# Patient Record
Sex: Female | Born: 1975 | Race: White | Hispanic: No | Marital: Single | Smoking: Never smoker
Health system: Southern US, Community
[De-identification: ages and names within clinical notes are randomized; demographics above are authoritative.]

## PROBLEM LIST (undated history)

## (undated) ENCOUNTER — Ambulatory Visit: Discharge status: Absent without leave | Payer: PRIVATE HEALTH INSURANCE

## (undated) DIAGNOSIS — C50412 Malignant neoplasm of upper-outer quadrant of left female breast: Principal | ICD-10-CM

## (undated) DIAGNOSIS — Z923 Personal history of irradiation: Secondary | ICD-10-CM

## (undated) DIAGNOSIS — Z87898 Personal history of other specified conditions: Secondary | ICD-10-CM

## (undated) DIAGNOSIS — F4541 Pain disorder exclusively related to psychological factors: Secondary | ICD-10-CM

## (undated) DIAGNOSIS — R569 Unspecified convulsions: Secondary | ICD-10-CM

## (undated) DIAGNOSIS — R011 Cardiac murmur, unspecified: Secondary | ICD-10-CM

## (undated) DIAGNOSIS — J45909 Unspecified asthma, uncomplicated: Secondary | ICD-10-CM

## (undated) HISTORY — PX: TYMPANOSTOMY TUBE PLACEMENT: SHX32

## (undated) HISTORY — DX: Malignant neoplasm of upper-outer quadrant of left female breast: C50.412

## (undated) HISTORY — PX: BREAST SURGERY: SHX581

## (undated) HISTORY — PX: ABDOMINAL HYSTERECTOMY: SHX81

---

## 1997-08-07 ENCOUNTER — Inpatient Hospital Stay (HOSPITAL_COMMUNITY): Admission: AD | Admit: 1997-08-07 | Discharge: 1997-08-08 | Payer: Self-pay | Admitting: *Deleted

## 1997-08-19 ENCOUNTER — Inpatient Hospital Stay (HOSPITAL_COMMUNITY): Admission: AD | Admit: 1997-08-19 | Discharge: 1997-08-22 | Payer: Self-pay | Admitting: Obstetrics

## 1997-08-26 ENCOUNTER — Inpatient Hospital Stay (HOSPITAL_COMMUNITY): Admission: AD | Admit: 1997-08-26 | Discharge: 1997-08-26 | Payer: Self-pay | Admitting: Obstetrics

## 1997-10-02 ENCOUNTER — Other Ambulatory Visit: Admission: RE | Admit: 1997-10-02 | Discharge: 1997-10-02 | Payer: Self-pay | Admitting: Family Medicine

## 1999-06-07 ENCOUNTER — Inpatient Hospital Stay (HOSPITAL_COMMUNITY): Admission: AD | Admit: 1999-06-07 | Discharge: 1999-06-07 | Payer: Self-pay | Admitting: *Deleted

## 1999-06-07 ENCOUNTER — Encounter: Payer: Self-pay | Admitting: *Deleted

## 1999-06-09 ENCOUNTER — Inpatient Hospital Stay (HOSPITAL_COMMUNITY): Admission: AD | Admit: 1999-06-09 | Discharge: 1999-06-09 | Payer: Self-pay | Admitting: *Deleted

## 1999-06-10 ENCOUNTER — Encounter (INDEPENDENT_AMBULATORY_CARE_PROVIDER_SITE_OTHER): Payer: Self-pay

## 1999-06-10 ENCOUNTER — Ambulatory Visit (HOSPITAL_COMMUNITY): Admission: RE | Admit: 1999-06-10 | Discharge: 1999-06-10 | Payer: Self-pay | Admitting: Obstetrics & Gynecology

## 2000-03-17 ENCOUNTER — Emergency Department (HOSPITAL_COMMUNITY): Admission: EM | Admit: 2000-03-17 | Discharge: 2000-03-17 | Payer: Self-pay | Admitting: Emergency Medicine

## 2000-03-17 ENCOUNTER — Encounter: Payer: Self-pay | Admitting: Emergency Medicine

## 2000-03-29 ENCOUNTER — Emergency Department (HOSPITAL_COMMUNITY): Admission: EM | Admit: 2000-03-29 | Discharge: 2000-03-30 | Payer: Self-pay | Admitting: Emergency Medicine

## 2001-12-12 ENCOUNTER — Emergency Department (HOSPITAL_COMMUNITY): Admission: EM | Admit: 2001-12-12 | Discharge: 2001-12-12 | Payer: Self-pay | Admitting: Emergency Medicine

## 2002-02-01 ENCOUNTER — Emergency Department (HOSPITAL_COMMUNITY): Admission: EM | Admit: 2002-02-01 | Discharge: 2002-02-01 | Payer: Self-pay | Admitting: Emergency Medicine

## 2002-06-17 ENCOUNTER — Emergency Department (HOSPITAL_COMMUNITY): Admission: EM | Admit: 2002-06-17 | Discharge: 2002-06-17 | Payer: Self-pay | Admitting: Emergency Medicine

## 2002-08-21 ENCOUNTER — Encounter: Admission: RE | Admit: 2002-08-21 | Discharge: 2002-08-21 | Payer: Self-pay | Admitting: *Deleted

## 2002-08-25 ENCOUNTER — Ambulatory Visit (HOSPITAL_COMMUNITY): Admission: RE | Admit: 2002-08-25 | Discharge: 2002-08-25 | Payer: Self-pay | Admitting: *Deleted

## 2002-09-04 ENCOUNTER — Encounter: Admission: RE | Admit: 2002-09-04 | Discharge: 2002-09-04 | Payer: Self-pay | Admitting: *Deleted

## 2002-09-11 ENCOUNTER — Inpatient Hospital Stay (HOSPITAL_COMMUNITY): Admission: AD | Admit: 2002-09-11 | Discharge: 2002-09-11 | Payer: Self-pay | Admitting: Family Medicine

## 2002-09-16 ENCOUNTER — Encounter: Admission: RE | Admit: 2002-09-16 | Discharge: 2002-09-16 | Payer: Self-pay | Admitting: *Deleted

## 2002-09-18 ENCOUNTER — Encounter: Admission: RE | Admit: 2002-09-18 | Discharge: 2002-09-18 | Payer: Self-pay | Admitting: *Deleted

## 2002-10-02 ENCOUNTER — Encounter: Admission: RE | Admit: 2002-10-02 | Discharge: 2002-10-02 | Payer: Self-pay | Admitting: *Deleted

## 2002-10-16 ENCOUNTER — Encounter: Admission: RE | Admit: 2002-10-16 | Discharge: 2002-10-16 | Payer: Self-pay | Admitting: *Deleted

## 2002-10-23 ENCOUNTER — Encounter: Admission: RE | Admit: 2002-10-23 | Discharge: 2002-10-23 | Payer: Self-pay | Admitting: Family Medicine

## 2002-10-23 ENCOUNTER — Ambulatory Visit (HOSPITAL_COMMUNITY): Admission: RE | Admit: 2002-10-23 | Discharge: 2002-10-23 | Payer: Self-pay | Admitting: *Deleted

## 2002-11-12 ENCOUNTER — Encounter: Admission: RE | Admit: 2002-11-12 | Discharge: 2002-11-12 | Payer: Self-pay | Admitting: *Deleted

## 2002-11-27 ENCOUNTER — Encounter: Admission: RE | Admit: 2002-11-27 | Discharge: 2002-11-27 | Payer: Self-pay | Admitting: *Deleted

## 2002-12-11 ENCOUNTER — Encounter: Admission: RE | Admit: 2002-12-11 | Discharge: 2002-12-11 | Payer: Self-pay | Admitting: *Deleted

## 2002-12-25 ENCOUNTER — Encounter: Admission: RE | Admit: 2002-12-25 | Discharge: 2002-12-25 | Payer: Self-pay | Admitting: *Deleted

## 2003-01-08 ENCOUNTER — Encounter: Admission: RE | Admit: 2003-01-08 | Discharge: 2003-01-08 | Payer: Self-pay | Admitting: Family Medicine

## 2003-01-11 ENCOUNTER — Inpatient Hospital Stay (HOSPITAL_COMMUNITY): Admission: AD | Admit: 2003-01-11 | Discharge: 2003-01-12 | Payer: Self-pay | Admitting: Family Medicine

## 2003-01-22 ENCOUNTER — Encounter: Admission: RE | Admit: 2003-01-22 | Discharge: 2003-01-22 | Payer: Self-pay | Admitting: Family Medicine

## 2003-02-05 ENCOUNTER — Encounter: Admission: RE | Admit: 2003-02-05 | Discharge: 2003-02-05 | Payer: Self-pay | Admitting: *Deleted

## 2003-02-10 ENCOUNTER — Inpatient Hospital Stay (HOSPITAL_COMMUNITY): Admission: AD | Admit: 2003-02-10 | Discharge: 2003-02-10 | Payer: Self-pay | Admitting: *Deleted

## 2003-02-19 ENCOUNTER — Ambulatory Visit (HOSPITAL_COMMUNITY): Admission: RE | Admit: 2003-02-19 | Discharge: 2003-02-19 | Payer: Self-pay | Admitting: Obstetrics and Gynecology

## 2003-02-19 ENCOUNTER — Encounter: Admission: RE | Admit: 2003-02-19 | Discharge: 2003-02-19 | Payer: Self-pay | Admitting: *Deleted

## 2003-03-02 ENCOUNTER — Inpatient Hospital Stay (HOSPITAL_COMMUNITY): Admission: AD | Admit: 2003-03-02 | Discharge: 2003-03-02 | Payer: Self-pay | Admitting: *Deleted

## 2003-03-03 ENCOUNTER — Encounter (INDEPENDENT_AMBULATORY_CARE_PROVIDER_SITE_OTHER): Payer: Self-pay | Admitting: *Deleted

## 2003-03-03 ENCOUNTER — Inpatient Hospital Stay (HOSPITAL_COMMUNITY): Admission: AD | Admit: 2003-03-03 | Discharge: 2003-03-06 | Payer: Self-pay | Admitting: *Deleted

## 2003-03-07 ENCOUNTER — Inpatient Hospital Stay (HOSPITAL_COMMUNITY): Admission: AD | Admit: 2003-03-07 | Discharge: 2003-03-07 | Payer: Self-pay | Admitting: Obstetrics and Gynecology

## 2004-02-12 ENCOUNTER — Emergency Department (HOSPITAL_COMMUNITY): Admission: EM | Admit: 2004-02-12 | Discharge: 2004-02-13 | Payer: Self-pay | Admitting: Emergency Medicine

## 2004-04-15 ENCOUNTER — Ambulatory Visit: Payer: Self-pay | Admitting: Internal Medicine

## 2004-05-01 ENCOUNTER — Emergency Department (HOSPITAL_COMMUNITY): Admission: EM | Admit: 2004-05-01 | Discharge: 2004-05-01 | Payer: Self-pay | Admitting: Family Medicine

## 2004-06-16 ENCOUNTER — Emergency Department (HOSPITAL_COMMUNITY): Admission: EM | Admit: 2004-06-16 | Discharge: 2004-06-16 | Payer: Self-pay | Admitting: Family Medicine

## 2004-06-20 ENCOUNTER — Emergency Department (HOSPITAL_COMMUNITY): Admission: EM | Admit: 2004-06-20 | Discharge: 2004-06-20 | Payer: Self-pay | Admitting: Family Medicine

## 2004-06-27 ENCOUNTER — Emergency Department (HOSPITAL_COMMUNITY): Admission: EM | Admit: 2004-06-27 | Discharge: 2004-06-27 | Payer: Self-pay | Admitting: Emergency Medicine

## 2004-11-16 ENCOUNTER — Emergency Department (HOSPITAL_COMMUNITY): Admission: EM | Admit: 2004-11-16 | Discharge: 2004-11-16 | Payer: Self-pay | Admitting: Family Medicine

## 2004-12-12 ENCOUNTER — Ambulatory Visit: Payer: Self-pay | Admitting: Family Medicine

## 2005-01-08 ENCOUNTER — Emergency Department (HOSPITAL_COMMUNITY): Admission: EM | Admit: 2005-01-08 | Discharge: 2005-01-08 | Payer: Self-pay | Admitting: Emergency Medicine

## 2005-01-28 ENCOUNTER — Emergency Department (HOSPITAL_COMMUNITY): Admission: EM | Admit: 2005-01-28 | Discharge: 2005-01-28 | Payer: Self-pay | Admitting: Emergency Medicine

## 2005-05-22 ENCOUNTER — Emergency Department (HOSPITAL_COMMUNITY): Admission: EM | Admit: 2005-05-22 | Discharge: 2005-05-22 | Payer: Self-pay | Admitting: Family Medicine

## 2005-06-02 ENCOUNTER — Emergency Department (HOSPITAL_COMMUNITY): Admission: EM | Admit: 2005-06-02 | Discharge: 2005-06-02 | Payer: Self-pay | Admitting: Family Medicine

## 2005-06-15 ENCOUNTER — Emergency Department (HOSPITAL_COMMUNITY): Admission: EM | Admit: 2005-06-15 | Discharge: 2005-06-16 | Payer: Self-pay | Admitting: Emergency Medicine

## 2005-08-01 ENCOUNTER — Ambulatory Visit: Payer: Self-pay | Admitting: Family Medicine

## 2005-08-25 ENCOUNTER — Ambulatory Visit: Payer: Self-pay | Admitting: Family Medicine

## 2005-09-29 ENCOUNTER — Ambulatory Visit: Payer: Self-pay | Admitting: Family Medicine

## 2005-12-12 ENCOUNTER — Emergency Department (HOSPITAL_COMMUNITY): Admission: EM | Admit: 2005-12-12 | Discharge: 2005-12-12 | Payer: Self-pay | Admitting: Family Medicine

## 2006-05-31 ENCOUNTER — Emergency Department (HOSPITAL_COMMUNITY): Admission: EM | Admit: 2006-05-31 | Discharge: 2006-05-31 | Payer: Self-pay | Admitting: Emergency Medicine

## 2006-07-18 ENCOUNTER — Emergency Department (HOSPITAL_COMMUNITY): Admission: EM | Admit: 2006-07-18 | Discharge: 2006-07-18 | Payer: Self-pay | Admitting: Emergency Medicine

## 2006-08-19 ENCOUNTER — Emergency Department (HOSPITAL_COMMUNITY): Admission: EM | Admit: 2006-08-19 | Discharge: 2006-08-19 | Payer: Self-pay | Admitting: Emergency Medicine

## 2006-10-24 ENCOUNTER — Emergency Department (HOSPITAL_COMMUNITY): Admission: EM | Admit: 2006-10-24 | Discharge: 2006-10-24 | Payer: Self-pay | Admitting: Family Medicine

## 2006-11-08 ENCOUNTER — Ambulatory Visit: Payer: Self-pay | Admitting: Internal Medicine

## 2006-11-09 ENCOUNTER — Emergency Department (HOSPITAL_COMMUNITY): Admission: EM | Admit: 2006-11-09 | Discharge: 2006-11-09 | Payer: Self-pay | Admitting: Family Medicine

## 2006-11-12 ENCOUNTER — Inpatient Hospital Stay (HOSPITAL_COMMUNITY): Admission: AD | Admit: 2006-11-12 | Discharge: 2006-11-12 | Payer: Self-pay | Admitting: Obstetrics & Gynecology

## 2006-12-23 ENCOUNTER — Emergency Department (HOSPITAL_COMMUNITY): Admission: EM | Admit: 2006-12-23 | Discharge: 2006-12-23 | Payer: Self-pay | Admitting: Emergency Medicine

## 2007-03-06 ENCOUNTER — Emergency Department (HOSPITAL_COMMUNITY): Admission: EM | Admit: 2007-03-06 | Discharge: 2007-03-06 | Payer: Self-pay | Admitting: Emergency Medicine

## 2007-03-21 DIAGNOSIS — L03039 Cellulitis of unspecified toe: Secondary | ICD-10-CM

## 2007-03-21 DIAGNOSIS — Q519 Congenital malformation of uterus and cervix, unspecified: Secondary | ICD-10-CM | POA: Insufficient documentation

## 2007-04-12 ENCOUNTER — Emergency Department (HOSPITAL_COMMUNITY): Admission: EM | Admit: 2007-04-12 | Discharge: 2007-04-12 | Payer: Self-pay | Admitting: Emergency Medicine

## 2007-04-12 ENCOUNTER — Ambulatory Visit (HOSPITAL_COMMUNITY): Admission: RE | Admit: 2007-04-12 | Discharge: 2007-04-12 | Payer: Self-pay | Admitting: Emergency Medicine

## 2007-04-30 ENCOUNTER — Emergency Department (HOSPITAL_COMMUNITY): Admission: EM | Admit: 2007-04-30 | Discharge: 2007-04-30 | Payer: Self-pay | Admitting: Family Medicine

## 2007-06-05 ENCOUNTER — Emergency Department (HOSPITAL_COMMUNITY): Admission: EM | Admit: 2007-06-05 | Discharge: 2007-06-05 | Payer: Self-pay | Admitting: Family Medicine

## 2007-06-16 ENCOUNTER — Emergency Department (HOSPITAL_COMMUNITY): Admission: EM | Admit: 2007-06-16 | Discharge: 2007-06-16 | Payer: Self-pay | Admitting: Emergency Medicine

## 2007-08-04 ENCOUNTER — Inpatient Hospital Stay (HOSPITAL_COMMUNITY): Admission: AD | Admit: 2007-08-04 | Discharge: 2007-08-04 | Payer: Self-pay | Admitting: Obstetrics & Gynecology

## 2007-08-05 ENCOUNTER — Emergency Department (HOSPITAL_COMMUNITY): Admission: EM | Admit: 2007-08-05 | Discharge: 2007-08-05 | Payer: Self-pay | Admitting: Family Medicine

## 2007-08-08 ENCOUNTER — Emergency Department (HOSPITAL_COMMUNITY): Admission: EM | Admit: 2007-08-08 | Discharge: 2007-08-08 | Payer: Self-pay | Admitting: Emergency Medicine

## 2007-08-14 ENCOUNTER — Ambulatory Visit (HOSPITAL_COMMUNITY): Admission: RE | Admit: 2007-08-14 | Discharge: 2007-08-14 | Payer: Self-pay | Admitting: Family Medicine

## 2007-08-15 ENCOUNTER — Encounter: Payer: Self-pay | Admitting: Obstetrics and Gynecology

## 2007-08-15 ENCOUNTER — Ambulatory Visit: Payer: Self-pay | Admitting: Obstetrics and Gynecology

## 2007-10-10 ENCOUNTER — Ambulatory Visit: Payer: Self-pay | Admitting: Family Medicine

## 2007-11-11 ENCOUNTER — Ambulatory Visit: Payer: Self-pay | Admitting: Obstetrics and Gynecology

## 2007-11-11 ENCOUNTER — Ambulatory Visit (HOSPITAL_COMMUNITY): Admission: RE | Admit: 2007-11-11 | Discharge: 2007-11-11 | Payer: Self-pay | Admitting: Obstetrics and Gynecology

## 2007-11-11 HISTORY — PX: HYSTEROSCOPY W/ ENDOMETRIAL ABLATION: SUR665

## 2007-11-27 ENCOUNTER — Ambulatory Visit: Payer: Self-pay | Admitting: Obstetrics and Gynecology

## 2008-02-02 ENCOUNTER — Emergency Department (HOSPITAL_COMMUNITY): Admission: EM | Admit: 2008-02-02 | Discharge: 2008-02-02 | Payer: Self-pay | Admitting: Family Medicine

## 2008-04-02 ENCOUNTER — Emergency Department (HOSPITAL_COMMUNITY): Admission: EM | Admit: 2008-04-02 | Discharge: 2008-04-02 | Payer: Self-pay | Admitting: Family Medicine

## 2008-05-10 ENCOUNTER — Emergency Department (HOSPITAL_COMMUNITY): Admission: EM | Admit: 2008-05-10 | Discharge: 2008-05-10 | Payer: Self-pay | Admitting: Family Medicine

## 2008-06-02 ENCOUNTER — Ambulatory Visit (HOSPITAL_BASED_OUTPATIENT_CLINIC_OR_DEPARTMENT_OTHER): Admission: RE | Admit: 2008-06-02 | Discharge: 2008-06-02 | Payer: Self-pay | Admitting: Emergency Medicine

## 2008-06-02 ENCOUNTER — Emergency Department (HOSPITAL_COMMUNITY): Admission: EM | Admit: 2008-06-02 | Discharge: 2008-06-02 | Payer: Self-pay | Admitting: Emergency Medicine

## 2008-06-02 ENCOUNTER — Ambulatory Visit: Payer: Self-pay | Admitting: Diagnostic Radiology

## 2008-07-01 ENCOUNTER — Ambulatory Visit: Payer: Self-pay | Admitting: Family Medicine

## 2008-07-01 ENCOUNTER — Encounter: Payer: Self-pay | Admitting: Family Medicine

## 2008-07-01 LAB — CONVERTED CEMR LAB
Free T4: 1.23 ng/dL (ref 0.89–1.80)
T3, Free: 3.2 pg/mL (ref 2.3–4.2)

## 2008-07-06 ENCOUNTER — Ambulatory Visit (HOSPITAL_COMMUNITY): Admission: RE | Admit: 2008-07-06 | Discharge: 2008-07-06 | Payer: Self-pay | Admitting: Family Medicine

## 2008-08-24 ENCOUNTER — Ambulatory Visit: Payer: Self-pay | Admitting: Family Medicine

## 2008-08-24 DIAGNOSIS — F329 Major depressive disorder, single episode, unspecified: Secondary | ICD-10-CM

## 2008-08-24 DIAGNOSIS — G4710 Hypersomnia, unspecified: Secondary | ICD-10-CM | POA: Insufficient documentation

## 2008-08-24 DIAGNOSIS — F32A Depression, unspecified: Secondary | ICD-10-CM | POA: Insufficient documentation

## 2008-09-04 ENCOUNTER — Encounter (INDEPENDENT_AMBULATORY_CARE_PROVIDER_SITE_OTHER): Payer: Self-pay | Admitting: Family Medicine

## 2008-09-10 ENCOUNTER — Encounter (INDEPENDENT_AMBULATORY_CARE_PROVIDER_SITE_OTHER): Payer: Self-pay | Admitting: Family Medicine

## 2008-09-21 ENCOUNTER — Ambulatory Visit (HOSPITAL_BASED_OUTPATIENT_CLINIC_OR_DEPARTMENT_OTHER): Admission: RE | Admit: 2008-09-21 | Discharge: 2008-09-21 | Payer: Self-pay | Admitting: Family Medicine

## 2008-09-21 ENCOUNTER — Encounter (INDEPENDENT_AMBULATORY_CARE_PROVIDER_SITE_OTHER): Payer: Self-pay | Admitting: Family Medicine

## 2008-09-27 ENCOUNTER — Ambulatory Visit: Payer: Self-pay | Admitting: Internal Medicine

## 2008-10-01 ENCOUNTER — Telehealth (INDEPENDENT_AMBULATORY_CARE_PROVIDER_SITE_OTHER): Payer: Self-pay | Admitting: *Deleted

## 2008-10-05 ENCOUNTER — Emergency Department (HOSPITAL_COMMUNITY): Admission: EM | Admit: 2008-10-05 | Discharge: 2008-10-05 | Payer: Self-pay | Admitting: Emergency Medicine

## 2008-11-10 ENCOUNTER — Emergency Department (HOSPITAL_COMMUNITY): Admission: EM | Admit: 2008-11-10 | Discharge: 2008-11-10 | Payer: Self-pay | Admitting: Family Medicine

## 2008-11-13 ENCOUNTER — Encounter (INDEPENDENT_AMBULATORY_CARE_PROVIDER_SITE_OTHER): Payer: Self-pay | Admitting: Family Medicine

## 2009-01-05 ENCOUNTER — Emergency Department (HOSPITAL_COMMUNITY): Admission: EM | Admit: 2009-01-05 | Discharge: 2009-01-05 | Payer: Self-pay | Admitting: Emergency Medicine

## 2009-01-11 ENCOUNTER — Emergency Department (HOSPITAL_COMMUNITY): Admission: EM | Admit: 2009-01-11 | Discharge: 2009-01-11 | Payer: Self-pay | Admitting: Emergency Medicine

## 2009-03-19 ENCOUNTER — Emergency Department (HOSPITAL_COMMUNITY): Admission: EM | Admit: 2009-03-19 | Discharge: 2009-03-20 | Payer: Self-pay | Admitting: Emergency Medicine

## 2009-04-15 ENCOUNTER — Emergency Department (HOSPITAL_COMMUNITY): Admission: EM | Admit: 2009-04-15 | Discharge: 2009-04-15 | Payer: Self-pay | Admitting: Emergency Medicine

## 2009-04-21 ENCOUNTER — Emergency Department (HOSPITAL_COMMUNITY): Admission: EM | Admit: 2009-04-21 | Discharge: 2009-04-21 | Payer: Self-pay | Admitting: Family Medicine

## 2009-05-26 ENCOUNTER — Emergency Department (HOSPITAL_COMMUNITY): Admission: EM | Admit: 2009-05-26 | Discharge: 2009-05-26 | Payer: Self-pay | Admitting: Family Medicine

## 2009-07-02 ENCOUNTER — Ambulatory Visit: Payer: Self-pay | Admitting: Internal Medicine

## 2009-07-02 LAB — CONVERTED CEMR LAB
AST: 21 units/L (ref 0–37)
Albumin: 4.4 g/dL (ref 3.5–5.2)
Alkaline Phosphatase: 82 units/L (ref 39–117)
BUN: 7 mg/dL (ref 6–23)
Basophils Absolute: 0.1 10*3/uL (ref 0.0–0.1)
Basophils Relative: 1 % (ref 0–1)
Creatinine, Ser: 0.77 mg/dL (ref 0.40–1.20)
Eosinophils Relative: 2 % (ref 0–5)
HCT: 44.3 % (ref 36.0–46.0)
Lymphocytes Relative: 25 % (ref 12–46)
MCHC: 33.2 g/dL (ref 30.0–36.0)
Platelets: 359 10*3/uL (ref 150–400)
Potassium: 5.3 meq/L (ref 3.5–5.3)
RDW: 14.5 % (ref 11.5–15.5)
Total Bilirubin: 0.3 mg/dL (ref 0.3–1.2)

## 2009-07-07 ENCOUNTER — Telehealth (INDEPENDENT_AMBULATORY_CARE_PROVIDER_SITE_OTHER): Payer: Self-pay | Admitting: Internal Medicine

## 2009-07-07 ENCOUNTER — Encounter (INDEPENDENT_AMBULATORY_CARE_PROVIDER_SITE_OTHER): Payer: Self-pay | Admitting: Internal Medicine

## 2009-07-07 DIAGNOSIS — R569 Unspecified convulsions: Secondary | ICD-10-CM

## 2009-07-12 ENCOUNTER — Telehealth (INDEPENDENT_AMBULATORY_CARE_PROVIDER_SITE_OTHER): Payer: Self-pay | Admitting: Internal Medicine

## 2009-07-19 ENCOUNTER — Encounter (INDEPENDENT_AMBULATORY_CARE_PROVIDER_SITE_OTHER): Payer: Self-pay | Admitting: *Deleted

## 2009-07-22 ENCOUNTER — Emergency Department (HOSPITAL_COMMUNITY): Admission: EM | Admit: 2009-07-22 | Discharge: 2009-07-22 | Payer: Self-pay | Admitting: Emergency Medicine

## 2009-08-06 ENCOUNTER — Ambulatory Visit: Payer: Self-pay | Admitting: Obstetrics and Gynecology

## 2009-08-11 ENCOUNTER — Ambulatory Visit: Payer: Self-pay | Admitting: Internal Medicine

## 2009-08-25 ENCOUNTER — Emergency Department (HOSPITAL_COMMUNITY): Admission: EM | Admit: 2009-08-25 | Discharge: 2009-08-26 | Payer: Self-pay | Admitting: Emergency Medicine

## 2009-09-24 ENCOUNTER — Ambulatory Visit (HOSPITAL_COMMUNITY): Admission: RE | Admit: 2009-09-24 | Discharge: 2009-09-24 | Payer: Self-pay | Admitting: Obstetrics and Gynecology

## 2009-10-08 ENCOUNTER — Ambulatory Visit: Payer: Self-pay | Admitting: Internal Medicine

## 2009-10-14 ENCOUNTER — Emergency Department (HOSPITAL_COMMUNITY): Admission: EM | Admit: 2009-10-14 | Discharge: 2009-10-14 | Payer: Self-pay | Admitting: Emergency Medicine

## 2009-11-04 ENCOUNTER — Ambulatory Visit: Payer: Self-pay | Admitting: Obstetrics & Gynecology

## 2009-12-20 ENCOUNTER — Encounter: Payer: Self-pay | Admitting: Obstetrics & Gynecology

## 2009-12-20 ENCOUNTER — Ambulatory Visit: Payer: Self-pay | Admitting: Obstetrics & Gynecology

## 2009-12-20 ENCOUNTER — Inpatient Hospital Stay (HOSPITAL_COMMUNITY): Admission: RE | Admit: 2009-12-20 | Discharge: 2009-12-22 | Payer: Self-pay | Admitting: Obstetrics & Gynecology

## 2009-12-20 DIAGNOSIS — E8940 Asymptomatic postprocedural ovarian failure: Secondary | ICD-10-CM

## 2009-12-20 DIAGNOSIS — Z79890 Hormone replacement therapy (postmenopausal): Secondary | ICD-10-CM

## 2009-12-20 HISTORY — PX: LYSIS OF ADHESION: SHX5961

## 2009-12-20 HISTORY — PX: CYSTO: SHX6284

## 2009-12-20 HISTORY — PX: TOTAL ABDOMINAL HYSTERECTOMY W/ BILATERAL SALPINGOOPHORECTOMY: SHX83

## 2009-12-29 ENCOUNTER — Inpatient Hospital Stay (HOSPITAL_COMMUNITY): Admission: AD | Admit: 2009-12-29 | Discharge: 2009-12-29 | Payer: Self-pay | Admitting: Obstetrics & Gynecology

## 2009-12-29 ENCOUNTER — Ambulatory Visit: Payer: Self-pay | Admitting: Family

## 2010-01-06 ENCOUNTER — Inpatient Hospital Stay (HOSPITAL_COMMUNITY): Admission: AD | Admit: 2010-01-06 | Discharge: 2010-01-06 | Payer: Self-pay | Admitting: Obstetrics and Gynecology

## 2010-01-06 ENCOUNTER — Ambulatory Visit: Payer: Self-pay | Admitting: Obstetrics & Gynecology

## 2010-01-27 ENCOUNTER — Inpatient Hospital Stay (HOSPITAL_COMMUNITY): Admission: AD | Admit: 2010-01-27 | Discharge: 2010-01-27 | Payer: Self-pay | Admitting: Obstetrics & Gynecology

## 2010-01-27 ENCOUNTER — Ambulatory Visit: Payer: Self-pay | Admitting: Obstetrics & Gynecology

## 2010-03-09 ENCOUNTER — Emergency Department (HOSPITAL_COMMUNITY): Admission: EM | Admit: 2010-03-09 | Discharge: 2010-03-09 | Payer: Self-pay | Admitting: Family Medicine

## 2010-03-28 ENCOUNTER — Emergency Department (HOSPITAL_COMMUNITY): Admission: EM | Admit: 2010-03-28 | Discharge: 2010-03-28 | Payer: Self-pay | Admitting: Emergency Medicine

## 2010-07-17 ENCOUNTER — Encounter: Payer: Self-pay | Admitting: *Deleted

## 2010-07-26 NOTE — Assessment & Plan Note (Signed)
Summary: 2 MONTH FU ON SEIZURES////KT   Vital Signs:  Patient profile:   35 year old female Weight:      216 pounds Temp:     97.5 degrees F Pulse rate:   101 / minute Pulse rhythm:   regular Resp:     20 per minute BP sitting:   111 / 69  (left arm) Cuff size:   large  Vitals Entered By: Vesta Mixer CMA (October 08, 2009 9:16 AM) CC: f/u on med she is taking.  Toprimate.  She feels it is working fine. Is Patient Diabetic? No Pain Assessment Patient in pain? yes     Location: stomach Intensity: 6  Does patient need assistance? Ambulation Normal   CC:  f/u on med she is taking.  Toprimate.  She feels it is working fine.Marland Kitchen  History of Present Illness: 1.  Seizure Disorder:  Currently taking Topiramate 2 tabs in morning and 1 tab at night.  Planning to increase to 3 tabs two times a day next week.  Tolerating dose fine.  No seizures since last seen.  Has not had a recurrence of headaches.  2.  Ovarian Cysts:  [facility]  Now with bilateral cysts with significant discomfort.  Cannot recall name of provider who is seeing her there.  Did not bring OCPs with her--will call that in later.  Current Medications (verified): 1)  Topiramate 25 Mg Tabs (Topiramate) .Marland Kitchen.. 1 Tab By Mouth Two Times A Day Week 1, Increase To 2 Tabs By Mouth Two Times A Day Beginning Week 2, 3 Tabs By Mouth Two Times 2)  Topiramate 100 Mg Tabs (Topiramate) .Marland Kitchen.. 1 Tab By Mouth Two Times A Day To Start Following Titration of Med To 100 Mg Two Times A Day  Allergies (verified): No Known Drug Allergies  Physical Exam  General:  NAD Lungs:  Normal respiratory effort, chest expands symmetrically. Lungs are clear to auscultation, no crackles or wheezes. Heart:  Normal rate and regular rhythm. S1 and S2 normal without gallop, murmur, click, rub or other extra sounds. Neurologic:  alert & oriented X3, cranial nerves II-XII intact, strength normal in all extremities, gait normal, and DTRs  symmetrical and normal.     Impression & Recommendations:  Problem # 1:  SEIZURE DISORDER (ICD-780.39) Pt. to titrate Topiramate as she tolerates over next 2 months to at least 3 tabs two times a day, but would ultimately would like her on 4 tabs two times a day -- then can switch to 100 mg tabs two times a day --she still has the latter Rx at home Contemplating looking to see if can follow Dr. Barbaraann Barthel to [place] at [facility]. Her updated medication list for this problem includes:    Topiramate 25 Mg Tabs (Topiramate) ..... Increase to 2 tabs by mouth two times a day next week, then titrate as tolerated to 4 tabs by mouth two times a day    Topiramate 100 Mg Tabs (Topiramate) .Marland Kitchen... 1 tab by mouth two times a day to start following titration of med to 100 mg two times a day  Problem # 2:  OVARIAN CYST, BILATERAL (ICD-620.2) Encourage giving OCPs more time--she is to call in name today to [place]. [facility]--she will let me know if she wants to switch to another gynecologist  Complete Medication List: 1)  Topiramate 25 Mg Tabs (Topiramate) .... Increase to 2 tabs by mouth two times a day next week, then titrate as  tolerated to 4 tabs by mouth two times a day 2)  Topiramate 100 Mg Tabs (Topiramate) .Marland Kitchen.. 1 tab by mouth two times a day to start following titration of med to 100 mg two times a day  Patient Instructions: 1)  To titrate Topiramate up to 3 tabs two times a day over next 2 months.  Call if any problems getting there. 2)  Follow up with Dr. Delrae Alfred in 4 months --Seizure disorder. 3)  Call and set up appt. in 4 months as a complete physical if your pap smear does not get done at [facility] at next visit Prescriptions: TOPIRAMATE 25 MG TABS (TOPIRAMATE) Increase to 2 tabs by mouth two times a day next week, then titrate as tolerated to 4 tabs by mouth two times a day  #240 x 4   Entered and Authorized by:   Julieanne Manson MD   Signed by:    Julieanne Manson MD on 10/08/2009   Method used:   Electronically to        [facility] (retail)       [address]       [place], [place]  [address]       Ph: [phone]       Fax: [phone]   RxID:   [id number]

## 2010-07-26 NOTE — Letter (Signed)
Summary: *Referral Letter  [facility]  [address] [place], [place] [address]   Phone: [phone]  Fax: [phone]    07/07/2009  Dr. Anne Hahn:  Thank you in advance for agreeing to see my patient:  [name] [address] [place], [place]  [address]  Phone: [phone]  Reason for Referral: Generalized seizure disorder.  Pt. has been having seizures again recently.  Hx of noncompliance.  Had reported side effects with Carbatrol and does not feel Dilantin worked well for her after a time.  Sister takes Topiramax and pt. requested to try as well.  Have initiated Topiramax at 25 mg two times a day  to titrate to 100 mg two times a day, but would appreciate your input on [name]'s treatment as she is new to me.  Previously followed by Dr. Beverley Fiedler at [facility].  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)   Current Medications: 1)  TOPIRAMATE 25 MG TABS (TOPIRAMATE) 1 tab by mouth two times a day week 1, increase to 2 tabs by mouth two times a day beginning week 2, 3 tabs by mouth two times 2)  TOPIRAMATE 100 MG TABS (TOPIRAMATE) 1 tab by mouth two times a day to start following titration of med to 100 mg two times a day   Past Medical History: 1)  HYPERSOMNIA, IDIOPATHIC (ICD-780.54) 2)  DEPRESSION, RECURRENT (ICD-311) 3)  PARONYCHIA, 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  Sincerely,  Julieanne Manson MD

## 2010-07-26 NOTE — Progress Notes (Signed)
Summary: [facility] referral  Phone Note Other Incoming   Summary of Call: Pt was being referred to [facility] but their office has dismissed pt due to multiple no shows. Will review with provider for next steps... Initial call taken by: Mikey College CMA,  July 12, 2009 3:29 PM  Follow-up for Phone Call        Please notify pt. of above--will just have her follow up here for now. Follow-up by: Julieanne Manson MD,  July 16, 2009 5:30 PM  Additional Follow-up for Phone Call Additional follow up Details #1::        Pt's phone number as been changed, d/c or no longer in service.  Will mail her a letter to call us. Additional Follow-up by: Vesta Mixer CMA,  July 19, 2009 12:36 PM

## 2010-07-26 NOTE — Assessment & Plan Note (Signed)
Summary: WANTS TO GET BACK ON SEIZURE MEDS//KT   Vital Signs:  Patient profile:   35 year old female Weight:      216.5 pounds BMI:     37.89 Temp:     98.0 degrees F oral Pulse rate:   72 / minute Pulse rhythm:   regular Resp:     20 per minute BP sitting:   118 / 80  (right arm) Cuff size:   large  Vitals Entered By: Mikey College CMA (July 02, 2009 3:06 PM) CC: pt states recently started back having seziures last one was 2days ago. pt states over the last month she's had 4 seizures. Pain Assessment Patient in pain? no       Does patient need assistance? Functional Status Self care Ambulation Normal   CC:  pt states recently started back having seziures last one was 2days ago. pt states over the last month she's had 4 seizures..  History of Present Illness: 1.  Seizures:  Started 13 years ago when pregnant with 1st son.  Pt. describes generalized tonic clonic seizure occuring for first time--went to the doctor to have evaluated and found out she was pregnant.  Was about 1 1/2 months pregnant at the time.  Had a normal EEG, CT scan of brain.  All blood work was normal.  Was seen by [facility]( Dr. Anne Hahn).  Was under Dr. Barbaraann Barthel' care here at the time.  Was started on Dilantin subsequently.  Because was having seizures on the Dilantin, was switched to another med.  Pt. states would have episodes of staring with fidgeting and was not aware of them.  Also, was pregnant again and OB wanted her to switch.  Was still under [facility]'s care.  The unknown second medication(later determined as Carbatrol) caused side effects--nausea, in particular.  After second child, stopped meds as did not have seizures when not pregnant.  When was pregnant a 3rd time, back on Dilantin as having rare seizures again.  Stopped during that pregnancy as did not want to harm fetus.    BTL after 3rd child.  Has also had [facility] secondary to [facility].  Having  significant cramps now and planning for hysterectomy.  Does not feel pregnant.    Seizures started up again 6 mos ago. Past 2 months, much more frequent.  3 in December.  1 in November.  Lasting under 5 minutes--bites tongue.  No bowel or bladder incontinence.  Sleepy afterwards.  Has not sought medical attention with any of these.  Does not want to be on Dilantin.  Wants to take what her sister is taking as what she is taking.  Taking Topiramate.  Pt. states sister has generalized seizures as well.  Pt. still having episodes of staring with fidgeting.   Stressed with 13, 11, 34 yo--lots of fighting, talking back.  Oldest child being tested for bipolar disorder--threatens to kill other people.  She feels this is what has brought the seizures back on.  Has not driven since started with seizures  Currently on no other meds. In old notes from Dr. Anne Hahn, pt's seizure disorder dates to childhood.  She states her seizures only started during first pregnancy.  Last seizure was last week.  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  Hx of seizure disorder Sister, 64:  Seizure disorder.  Physical Exam  General:  NAD Lungs:  Normal respiratory effort, chest expands symmetrically. Lungs are clear to auscultation, no crackles or wheezes. Heart:  Normal rate and regular rhythm. S1 and S2 normal without gallop, murmur, click, rub or other extra sounds. Neurologic:  alert & oriented X3 and cranial nerves II-XII intact.     Impression & Recommendations:  Problem # 1:  SEIZURE DISORDER (ICD-780.39)  Will go ahead and start pt. on Topiramate after review of her paper chart and medication. Will have  her follow up here in 1 month to make sure she is not having any problems with depression--has a history of the latter as well. Called pt. and gave her directions on taking the Topirimate.   She denies signs or symptoms of depression currently. INstructed her to discuss possibility of depression with antiseizure med with her husband and either of them to call if concern that she is developing. To follow up with me in 1 month--our office will call to set up. Orders: T-Comprehensive Metabolic Panel [phone]) T-CBC w/Diff [phone]) Neurology Referral (Neuro)  Her updated medication list for this problem includes:    Topiramate 25 Mg Tabs (Topiramate) .Marland Kitchen... 1 tab by mouth two times a day week 1, increase to 2 tabs by mouth two times a day beginning week 2, 3 tabs by mouth two times    Topiramate 100 Mg Tabs (Topiramate) .Marland Kitchen... 1 tab by mouth two times a day to start following titration of med to 100 mg two times a day  Complete Medication List: 1)  Topiramate 25 Mg Tabs (Topiramate) .Marland Kitchen.. 1 tab by mouth two times a day week 1, increase to 2 tabs by mouth two times a day beginning week 2, 3 tabs by mouth two times 2)  Topiramate 100 Mg Tabs (Topiramate) .Marland Kitchen.. 1 tab by mouth two times a day to start following titration of med to 100 mg two times a day  Patient Instructions: 1)  Call if you do not hear from Dr Delrae Alfred in next week Prescriptions: TOPIRAMATE 100 MG TABS (TOPIRAMATE) 1 tab by mouth two times a day to start following titration of med to 100 mg two times a day  #60 x 1   Entered and Authorized by:   Julieanne Manson MD   Signed by:   Julieanne Manson MD on 07/07/2009   Method used:   Electronically to        [facility] (retail)       [address]       [place], [place]  [address]       Ph: [phone]       Fax: [phone]   RxID:   [id number] TOPIRAMATE 25 MG TABS (TOPIRAMATE) 1 tab by mouth two times a day week 1, increase to 2  tabs by mouth two times a day beginning week 2, 3 tabs by mouth two times  #140 x 0   Entered and Authorized by:   Julieanne Manson MD   Signed by:   Julieanne Manson MD on 07/07/2009   Method used:   Electronically to        [facility] (retail)       [address]       [place], [place]  [address]       Ph: [phone]  Fax: [phone]   RxID:   [id number]

## 2010-07-26 NOTE — Letter (Signed)
Summary: *HSN Results Follow up  [facility]  [address] [place], [place] [address]   Phone: [phone]  Fax: [phone]      07/19/2009   [facility] [address] [place], [place]  [address]   Dear  Ms. Lecia Langlais,                            ____S.Drinkard,FNP   ____D. Gore,FNP       ____B. McPherson,MD   ____V. Rankins,MD    __X__E. Mulberry,MD    ____N. Daphine Deutscher, FNP  ____D. Reche Dixon, MD    ____K. Philipp Deputy, MD    ____Other     This letter is to inform you that your recent test(s):  _______Pap Smear    _______Lab Test     _______X-ray    _______ is within acceptable limits  _______ requires a medication change  _______ requires a follow-up lab visit  _______ requires a follow-up visit with your provider   Comments:  Please give our office a call regarding a referral we were trying to make for                     you.  Thank you.       _________________________________________________________ If you have any questions, please contact our office                     Sincerely,  Tiffany McCoy CMA [facility]

## 2010-07-26 NOTE — Progress Notes (Signed)
  Phone Note Outgoing Call   Summary of Call: Called pt. and instructed on how to titrat Topiramax to 100 mg two times a day.  Please call pt. and schedule a follow up with me in 1 month--pt. will address what she states in psoriasis with me then as well. Initial call taken by: Julieanne Manson MD,  July 07, 2009 12:56 PM  Follow-up for Phone Call        left a message to the pt to call me back.Manon Hilding  July 07, 2009 5:01 PM  Additional Follow-up for Phone Call Additional follow up Details #1::        Please leave this on your phone messages until she calls you back--I do not need to see this back Additional Follow-up by: Julieanne Manson MD,  July 07, 2009 9:28 PM    Additional Follow-up for Phone Call Additional follow up Details #2::    Pt will come on Aug 10, 2009 at (:30 for one month follow up visit.Manon Hilding  July 09, 2009 1:15 PM

## 2010-07-26 NOTE — Assessment & Plan Note (Signed)
Summary: follow one month visit/ psoriasis//gk   Vital Signs:  Patient profile:   35 year old female Weight:      211 pounds Temp:     98.2 degrees F Pulse rhythm:   regular Resp:     20 per minute BP sitting:   122 / 80  (right arm) Cuff size:   regular  Vitals Entered By: Vesta Mixer CMA (August 11, 2009 9:53 AM) CC: f/u on seizure meds Is Patient Diabetic? No Pain Assessment Patient in pain? yes     Location: stomach Intensity: 6  Does patient need assistance? Ambulation Normal   CC:  f/u on seizure meds.  History of Present Illness: 1.  Seizure Disorder:  On Topiramate.  States as titrated med up to 2 tabs two times a day, developed headache.   Went back to 2 tabs in morning and 1 tab at night and did fine.  Went into ED on 07/22/09 for pelvic pain and diagnosed with a new right ovarian complex cyst for which she has since been seen at [facility] and started on an unknown BCP, she cannot recall the name--possibly Yaz.  Was also diagnosed with BV and started on Flagyl, which is now done.   Stopped the Topiramate since then as she was unsure if it was okay to take with Flagyl.  Has not had a seizure since last here.  Unable to send to Neurology as she was fired from their practice for missed appts.  No problems with depression while taking Topiramate.  Allergies (verified): No Known Drug Allergies  Physical Exam  General:  NAD Lungs:  Normal respiratory effort, chest expands symmetrically. Lungs are clear to auscultation, no crackles or wheezes. Heart:  Normal rate and regular rhythm. S1 and S2 normal without gallop, murmur, click, rub or other extra sounds. Neurologic:  alert & oriented X3 and cranial nerves II-XII intact.     Impression & Recommendations:  Problem # 1:  SEIZURE DISORDER (ICD-780.39) Restart titration of Topiramate. May titrate up by one tab weekly instead of 2 tabs. Her updated medication list for this problem includes:    Topiramate 25  Mg Tabs (Topiramate) .Marland Kitchen... 1 tab by mouth two times a day week 1, increase to 2 tabs by mouth two times a day beginning week 2, 3 tabs by mouth two times    Topiramate 100 Mg Tabs (Topiramate) .Marland Kitchen... 1 tab by mouth two times a day to start following titration of med to 100 mg two times a day  Complete Medication List: 1)  Topiramate 25 Mg Tabs (Topiramate) .Marland Kitchen.. 1 tab by mouth two times a day week 1, increase to 2 tabs by mouth two times a day beginning week 2, 3 tabs by mouth two times 2)  Topiramate 100 Mg Tabs (Topiramate) .Marland Kitchen.. 1 tab by mouth two times a day to start following titration of med to 100 mg two times a day  Patient Instructions: 1)  Follow up with Dr. Delrae Alfred in 2 months --seizures Prescriptions: TOPIRAMATE 25 MG TABS (TOPIRAMATE) 1 tab by mouth two times a day week 1, increase to 2 tabs by mouth two times a day beginning week 2, 3 tabs by mouth two times  #240 x 1   Entered and Authorized by:   Julieanne Manson MD   Signed by:   Julieanne Manson MD on 08/11/2009   Method used:   Electronically to        [facility] (retail)  [address]       [place], [place]  [address]       Ph: [phone]       Fax: [phone]   RxID:   [id number]

## 2010-09-09 LAB — URINALYSIS, ROUTINE W REFLEX MICROSCOPIC
Bilirubin Urine: NEGATIVE
Glucose, UA: NEGATIVE mg/dL
Hgb urine dipstick: NEGATIVE
Specific Gravity, Urine: <1.005 — ABNORMAL LOW (ref 1.005–1.030)
pH: 6.5 (ref 5.0–8.0)

## 2010-09-11 LAB — COMPREHENSIVE METABOLIC PANEL
ALT: 15 U/L (ref 0–35)
AST: 18 U/L (ref 0–37)
CO2: 23 mEq/L (ref 19–32)
Calcium: 8.7 mg/dL (ref 8.4–10.5)
Chloride: 104 mEq/L (ref 96–112)
Creatinine, Ser: 0.85 mg/dL (ref 0.4–1.2)
GFR calc Af Amer: >60 mL/min (ref >60)
GFR calc non Af Amer: >60 mL/min (ref >60)
Glucose, Bld: 116 mg/dL — ABNORMAL HIGH (ref 70–99)
Sodium: 133 mEq/L — ABNORMAL LOW (ref 135–145)
Total Bilirubin: 0.2 mg/dL — ABNORMAL LOW (ref 0.3–1.2)

## 2010-09-11 LAB — URINE MICROSCOPIC-ADD ON

## 2010-09-11 LAB — URINALYSIS, ROUTINE W REFLEX MICROSCOPIC
Glucose, UA: NEGATIVE mg/dL
Leukocytes, UA: NEGATIVE
Specific Gravity, Urine: 1.022 (ref 1.005–1.030)
pH: 5.5 (ref 5.0–8.0)

## 2010-09-11 LAB — DIFFERENTIAL
Basophils Absolute: 0 10*3/uL (ref 0.0–0.1)
Basophils Relative: 0 % (ref 0–1)
Eosinophils Absolute: 0.1 10*3/uL (ref 0.0–0.7)
Eosinophils Relative: 1 % (ref 0–5)
Neutrophils Relative %: 87 % — ABNORMAL HIGH (ref 43–77)

## 2010-09-11 LAB — CBC
Hemoglobin: 13.6 g/dL (ref 12.0–15.0)
MCHC: 34.1 g/dL (ref 30.0–36.0)
MCV: 83.2 fL (ref 78.0–100.0)
RBC: 4.79 MIL/uL (ref 3.87–5.11)
WBC: 8.6 10*3/uL (ref 4.0–10.5)

## 2010-09-11 LAB — URINE CULTURE: Colony Count: 5000

## 2010-09-11 LAB — WET PREP, GENITAL: Yeast Wet Prep HPF POC: NONE SEEN

## 2010-09-11 LAB — POCT PREGNANCY, URINE: Preg Test, Ur: NEGATIVE

## 2010-09-12 LAB — CBC
HCT: 43.1 % (ref 36.0–46.0)
Hemoglobin: 14.6 g/dL (ref 12.0–15.0)
MCH: 28.9 pg (ref 26.0–34.0)
MCV: 85.5 fL (ref 78.0–100.0)
MCV: 86.6 fL (ref 78.0–100.0)
Platelets: 258 10*3/uL (ref 150–400)
Platelets: 285 10*3/uL (ref 150–400)
RBC: 5.04 MIL/uL (ref 3.87–5.11)
RDW: 14.5 % (ref 11.5–15.5)
WBC: 4.8 10*3/uL (ref 4.0–10.5)
WBC: 9.4 10*3/uL (ref 4.0–10.5)

## 2010-09-12 LAB — SURGICAL PCR SCREEN
MRSA, PCR: NEGATIVE
Staphylococcus aureus: POSITIVE — AB

## 2010-09-12 LAB — PREGNANCY, URINE: Preg Test, Ur: NEGATIVE

## 2010-09-17 ENCOUNTER — Inpatient Hospital Stay (INDEPENDENT_AMBULATORY_CARE_PROVIDER_SITE_OTHER)
Admission: RE | Admit: 2010-09-17 | Discharge: 2010-09-17 | Disposition: A | Discharge status: Home / Self Care | Payer: Self-pay | Source: Ambulatory Visit | Attending: Emergency Medicine | Admitting: Emergency Medicine

## 2010-09-17 DIAGNOSIS — M940 Chondrocostal junction syndrome [Tietze]: Secondary | ICD-10-CM

## 2010-09-18 LAB — URINALYSIS, ROUTINE W REFLEX MICROSCOPIC
Bilirubin Urine: NEGATIVE
Hgb urine dipstick: NEGATIVE
Nitrite: NEGATIVE
Protein, ur: NEGATIVE mg/dL
Urobilinogen, UA: 0.2 mg/dL (ref 0.0–1.0)

## 2010-09-18 LAB — PREGNANCY, URINE: Preg Test, Ur: NEGATIVE

## 2010-09-27 LAB — POCT URINALYSIS DIP (DEVICE)
Glucose, UA: NEGATIVE mg/dL
Hgb urine dipstick: NEGATIVE
Nitrite: NEGATIVE
Urobilinogen, UA: 1 mg/dL (ref 0.0–1.0)
pH: 7 (ref 5.0–8.0)

## 2010-09-30 LAB — URINE MICROSCOPIC-ADD ON

## 2010-09-30 LAB — URINALYSIS, ROUTINE W REFLEX MICROSCOPIC
Bilirubin Urine: NEGATIVE
Specific Gravity, Urine: 1.022 (ref 1.005–1.030)
pH: 5.5 (ref 5.0–8.0)

## 2010-09-30 LAB — CBC
HCT: 39 % (ref 36.0–46.0)
MCV: 84.7 fL (ref 78.0–100.0)
Platelets: 319 10*3/uL (ref 150–400)
RDW: 14.7 % (ref 11.5–15.5)

## 2010-09-30 LAB — GC/CHLAMYDIA PROBE AMP, GENITAL
Chlamydia, DNA Probe: NEGATIVE
GC Probe Amp, Genital: NEGATIVE

## 2010-09-30 LAB — COMPREHENSIVE METABOLIC PANEL
Albumin: 3.8 g/dL (ref 3.5–5.2)
BUN: 8 mg/dL (ref 6–23)
Chloride: 105 mEq/L (ref 96–112)
Creatinine, Ser: 0.81 mg/dL (ref 0.4–1.2)
Total Bilirubin: 0.6 mg/dL (ref 0.3–1.2)
Total Protein: 7.4 g/dL (ref 6.0–8.3)

## 2010-09-30 LAB — DIFFERENTIAL
Basophils Absolute: 0.1 10*3/uL (ref 0.0–0.1)
Lymphocytes Relative: 9 % — ABNORMAL LOW (ref 12–46)
Monocytes Absolute: 0.4 10*3/uL (ref 0.1–1.0)
Neutro Abs: 10.2 10*3/uL — ABNORMAL HIGH (ref 1.7–7.7)

## 2010-09-30 LAB — PREGNANCY, URINE: Preg Test, Ur: NEGATIVE

## 2010-09-30 LAB — URINE CULTURE

## 2010-09-30 LAB — LIPASE, BLOOD: Lipase: 16 U/L (ref 11–59)

## 2010-09-30 LAB — WET PREP, GENITAL

## 2010-10-05 LAB — POCT I-STAT, CHEM 8
BUN: 12 mg/dL (ref 6–23)
Calcium, Ion: 1.1 mmol/L — ABNORMAL LOW (ref 1.12–1.32)
Chloride: 104 mEq/L (ref 96–112)
Creatinine, Ser: 0.9 mg/dL (ref 0.4–1.2)
TCO2: 26 mmol/L (ref 0–100)

## 2010-10-05 LAB — DIFFERENTIAL
Basophils Relative: 1 % (ref 0–1)
Eosinophils Absolute: 0.1 10*3/uL (ref 0.0–0.7)
Eosinophils Relative: 1 % (ref 0–5)
Monocytes Absolute: 0.5 10*3/uL (ref 0.1–1.0)
Monocytes Relative: 6 % (ref 3–12)

## 2010-10-05 LAB — WET PREP, GENITAL
Trich, Wet Prep: NONE SEEN
WBC, Wet Prep HPF POC: NONE SEEN
Yeast Wet Prep HPF POC: NONE SEEN

## 2010-10-05 LAB — CBC
HCT: 40.1 % (ref 36.0–46.0)
Hemoglobin: 13.5 g/dL (ref 12.0–15.0)
MCHC: 33.6 g/dL (ref 30.0–36.0)
MCV: 84.8 fL (ref 78.0–100.0)
RBC: 4.73 MIL/uL (ref 3.87–5.11)

## 2010-10-05 LAB — URINALYSIS, ROUTINE W REFLEX MICROSCOPIC
Bilirubin Urine: NEGATIVE
Glucose, UA: NEGATIVE mg/dL
Hgb urine dipstick: NEGATIVE
Protein, ur: NEGATIVE mg/dL
Urobilinogen, UA: 1 mg/dL (ref 0.0–1.0)

## 2010-10-05 LAB — GC/CHLAMYDIA PROBE AMP, GENITAL: Chlamydia, DNA Probe: NEGATIVE

## 2010-10-21 ENCOUNTER — Emergency Department (HOSPITAL_COMMUNITY): Payer: Medicaid Other

## 2010-10-21 ENCOUNTER — Emergency Department (HOSPITAL_COMMUNITY)
Admission: EM | Admit: 2010-10-21 | Discharge: 2010-10-21 | Disposition: A | Discharge status: Home / Self Care | Payer: Medicaid Other | Attending: Emergency Medicine | Admitting: Emergency Medicine

## 2010-10-21 DIAGNOSIS — R059 Cough, unspecified: Secondary | ICD-10-CM | POA: Insufficient documentation

## 2010-10-21 DIAGNOSIS — R630 Anorexia: Secondary | ICD-10-CM | POA: Insufficient documentation

## 2010-10-21 DIAGNOSIS — R109 Unspecified abdominal pain: Secondary | ICD-10-CM | POA: Insufficient documentation

## 2010-10-21 DIAGNOSIS — J189 Pneumonia, unspecified organism: Secondary | ICD-10-CM | POA: Insufficient documentation

## 2010-10-21 DIAGNOSIS — R509 Fever, unspecified: Secondary | ICD-10-CM | POA: Insufficient documentation

## 2010-10-21 DIAGNOSIS — R079 Chest pain, unspecified: Secondary | ICD-10-CM | POA: Insufficient documentation

## 2010-10-21 DIAGNOSIS — G40909 Epilepsy, unspecified, not intractable, without status epilepticus: Secondary | ICD-10-CM | POA: Insufficient documentation

## 2010-10-21 DIAGNOSIS — R000 Tachycardia, unspecified: Secondary | ICD-10-CM | POA: Insufficient documentation

## 2010-10-21 DIAGNOSIS — R05 Cough: Secondary | ICD-10-CM | POA: Insufficient documentation

## 2010-10-21 DIAGNOSIS — R112 Nausea with vomiting, unspecified: Secondary | ICD-10-CM | POA: Insufficient documentation

## 2010-10-21 LAB — URINALYSIS, ROUTINE W REFLEX MICROSCOPIC
Bilirubin Urine: NEGATIVE
Hgb urine dipstick: NEGATIVE
Ketones, ur: 15 mg/dL — AB
Nitrite: NEGATIVE
Specific Gravity, Urine: 1.018 (ref 1.005–1.030)
Urobilinogen, UA: 1 mg/dL (ref 0.0–1.0)
pH: 6 (ref 5.0–8.0)

## 2010-10-21 LAB — POCT PREGNANCY, URINE: Preg Test, Ur: NEGATIVE

## 2010-10-23 ENCOUNTER — Emergency Department (HOSPITAL_COMMUNITY): Payer: Medicaid Other

## 2010-10-23 ENCOUNTER — Inpatient Hospital Stay (HOSPITAL_COMMUNITY)
Admission: EM | Admit: 2010-10-23 | Discharge: 2010-10-24 | DRG: 195 | Disposition: A | Discharge status: Home / Self Care | Payer: Medicaid Other | Attending: Internal Medicine | Admitting: Internal Medicine

## 2010-10-23 DIAGNOSIS — G40909 Epilepsy, unspecified, not intractable, without status epilepticus: Secondary | ICD-10-CM | POA: Diagnosis present

## 2010-10-23 DIAGNOSIS — N949 Unspecified condition associated with female genital organs and menstrual cycle: Secondary | ICD-10-CM | POA: Diagnosis present

## 2010-10-23 DIAGNOSIS — J189 Pneumonia, unspecified organism: Principal | ICD-10-CM | POA: Diagnosis present

## 2010-10-23 LAB — DIFFERENTIAL
Eosinophils Absolute: 0.2 10*3/uL (ref 0.0–0.7)
Eosinophils Relative: 3 % (ref 0–5)
Lymphocytes Relative: 30 % (ref 12–46)
Lymphs Abs: 1.5 10*3/uL (ref 0.7–4.0)
Monocytes Absolute: 0.5 10*3/uL (ref 0.1–1.0)

## 2010-10-23 LAB — LIPID PANEL
Cholesterol: 125 mg/dL (ref 0–200)
HDL: 22 mg/dL — ABNORMAL LOW (ref >39)
LDL Cholesterol: 84 mg/dL (ref 0–99)
Total CHOL/HDL Ratio: 5.7 RATIO
Triglycerides: 93 mg/dL (ref <150)
VLDL: 19 mg/dL (ref 0–40)

## 2010-10-23 LAB — POCT I-STAT, CHEM 8
Calcium, Ion: 1.1 mmol/L — ABNORMAL LOW (ref 1.12–1.32)
Chloride: 101 mEq/L (ref 96–112)
Creatinine, Ser: 1 mg/dL (ref 0.4–1.2)
Glucose, Bld: 133 mg/dL — ABNORMAL HIGH (ref 70–99)
Potassium: 3.6 mEq/L (ref 3.5–5.1)

## 2010-10-23 LAB — HEMOGLOBIN A1C
Hgb A1c MFr Bld: 5.9 % — ABNORMAL HIGH (ref <5.7)
Mean Plasma Glucose: 123 mg/dL — ABNORMAL HIGH (ref <117)

## 2010-10-23 LAB — CBC
HCT: 39.3 % (ref 36.0–46.0)
MCH: 26.1 pg (ref 26.0–34.0)
MCHC: 32.3 g/dL (ref 30.0–36.0)
MCV: 80.9 fL (ref 78.0–100.0)
Platelets: 264 10*3/uL (ref 150–400)
RDW: 14.8 % (ref 11.5–15.5)

## 2010-10-23 LAB — LEGIONELLA ANTIGEN, URINE: Legionella Antigen, Urine: NEGATIVE

## 2010-10-23 LAB — HEPATITIS C ANTIBODY: HCV Ab: NEGATIVE

## 2010-10-23 LAB — HEPATITIS B SURFACE ANTIGEN: Hepatitis B Surface Ag: NEGATIVE

## 2010-10-23 LAB — TSH: TSH: 4.704 u[IU]/mL — ABNORMAL HIGH (ref 0.350–4.500)

## 2010-10-23 LAB — HEPATITIS B SURFACE ANTIBODY,QUALITATIVE: Hep B S Ab: NEGATIVE

## 2010-10-23 LAB — STREP PNEUMONIAE URINARY ANTIGEN: Strep Pneumo Urinary Antigen: NEGATIVE

## 2010-10-25 NOTE — Progress Notes (Signed)
Results faxed to [facility] 

## 2010-11-08 NOTE — Procedures (Signed)
NAMECHRISTIAN, [name]            ACCOUNT NO.:  [id number]   MEDICAL RECORD NO.:  [id number]          PATIENT TYPE:  OUT   LOCATION:  SLEEP CENTER                 FACILITY:  [facility]   PHYSICIAN:  Clinton D. Maple Hudson, MD, FCCP, FACPDATE OF BIRTH:  Jan 07, 1977   DATE OF STUDY:  09/21/2008                            NOCTURNAL POLYSOMNOGRAM   REFERRING PHYSICIAN:   REFERRING PHYSICIAN:  Turkey R. Rankins, MD   INDICATION FOR STUDY:  Hypersomnia with sleep apnea.   EPWORTH SLEEPINESS SCORE:  9/24, BMI 34.7.  Weight 196 pounds, height 63  inches.  Neck 13 inches.   MEDICATIONS:  None listed.   SLEEP ARCHITECTURE:  Total sleep time 302 minutes with sleep efficiency  81.7%.  Stage I was 7.3%, stage II 68.4%, stage III 12.6%, REM 11.8% of  total sleep time.  Sleep latency 14 minutes, REM latency 203 minutes,  awake after sleep onset 48 minutes, arousal index 17.7.  No bedtime  medication was taken.   RESPIRATORY DATA:  Apnea/hypopnea index (AHI) 1.4 per hour.  A total of  70 events was scored including 1 obstructive apnea and 6 hypopneas.  Events were not positional.  REM AHI 5.1 per hour.  Additional  respiratory events related to arousals, number 18, not meeting normal  scoring criteria as apneas or hypopneas because of duration, contributed  to an overall respiratory disturbance index (RDI) of 5 per hour.  There  were insufficient events to permit CPAP titration by split protocol on  the study night.   OXYGEN DATA:  Minimal snoring with oxygen desaturation to a nadir of  88%.  Mean oxygen saturation through the study was 96.4% on room air.   CARDIAC DATA:  Occasional PAC.   MOVEMENT-PARASOMNIA:  No significant movement disturbance.  Bathroom x1.   IMPRESSIONS-RECOMMENDATIONS:  1. Occasional respiratory event with sleep disturbance, AHI 1.4 per      hour, within normal limits (normal range 0-5 per hour).  Events      were not positional.  Minimal snoring with oxygen desaturation to  a      nadir of 88%.      Clinton D. Maple Hudson, MD, [facility], FACP  Diplomate, Biomedical engineer of Sleep Medicine  Electronically Signed     CDY/MEDQ  D:  09/27/2008 17:58:51  T:  09/27/2008 09:81:19  Job:  [id number]

## 2010-11-08 NOTE — Group Therapy Note (Signed)
[name], [name] NO.:  [id number]   MEDICAL RECORD NO.:  [id number]          PATIENT TYPE:  WOC   LOCATION:  [facility]                   FACILITY:  [facility]   PHYSICIAN:  Argentina Donovan, MD        DATE OF BIRTH:  11-Mar-1976   DATE OF SERVICE:  08/15/2007                                  CLINIC NOTE   The patient is a 35 year old Caucasian female gravida 5, para 3-0-2-3  with history of 3 cesarean sections and she has a bicornate uterus last  4 months his been disabled by a severe pain on her left side during her  period but only during the period.  We have talked for about  alternatives to trying Depo-Provera to try and control that.  Maybe is  she is not getting good range from the horn on that side.  If that is  successful, then we might consider a hydrothermal ablation of the  endometrium to see if they could not reduce her pain that way.  As a  last resort, hysterectomy.  I am going to give her a shot of Depo-  Provera today, have her come back in couple months to see how well she  is doing on that.   IMPRESSION:  Severe disabling dysmenorrhea bicornate uterus.           ______________________________  Argentina Donovan, MD     PR/MEDQ  D:  08/15/2007  T:  08/16/2007  Job:  [id number]

## 2010-11-08 NOTE — Group Therapy Note (Signed)
[name], [name]            ACCOUNT NO.:  [id number]   MEDICAL RECORD NO.:  [id number]          PATIENT TYPE:  WOC   LOCATION:  [facility]                   FACILITY:  [facility]   PHYSICIAN:  Tinnie Gens, MD        DATE OF BIRTH:  [birth year]   DATE OF SERVICE:                                  CLINIC NOTE   REASON FOR VISIT:  Followup, dysmenorrhea.   HISTORY OF PRESENT ILLNESS:  This is a 35 year old G5, P3-0-2-3 with a  history of three prior cesarean sections and bicornuate uterus, who has  had approximately five months of severe, disabling dysmenorrhea on her  left side, only during her menses.  She was previously seen by Dr. Okey Dupre  in February of this year, at which time she was discussed different  treatment options.  She has had multiple visits to the emergency  department for this pain since onset.  Dr. Okey Dupre at her prior visit  agreed to try a single Depo-Provera injection to see if this would  relieve her pain.  She reports that since receiving this, she has had  significant relief of her pain when she is not bleeding; however,  continues to have severe cramping and left-sided discomfort with  bleeding.  She did have approximately 19 days of bleeding and cramping  last month and is starting to have spotting today along with some  cramping.  She also mentions that she had discussed the possibility of  hydrothermal ablation of the endometrium and as a last resort,  hysterectomy, with Dr. Okey Dupre.  She reports that she does not wish to  continue on the Depo-Provera injection, as it is causing weight gain.  There is a documented 4 pound weight gain since her visit in February.  She desires to proceed with the hydrothermal ablation surgery at this  time.  Dr. Shawnie Pons discussed this with this procedure with the patient  today as well.   MEDICATIONS:  Aleve as needed and Depo-Provera.   ALLERGIES:  No known drug allergies.  No latex allergy.   PAST MEDICAL HISTORY:  Significant  only for seizure disorder, which was  limited to her pregnancy.  She is not currently on medication and has  not had any seizures since being pregnant.  Please see full GYN history  in chart for the remainder of past medical history, social and family  history.   PHYSICAL EXAMINATION:  VITAL SIGNS:  Temperature 97.4, heart rate 65,  blood pressure 106/59.  Weight is 191.4 pounds.  Height 5 feet 3 inches.  Respiratory rate is 16.  GENERAL:  This is a mildly overweight female in no acute distress.  HEENT:  Head is normocephalic and atraumatic.  Mucous membranes are  moist.  CARDIOVASCULAR:  Regular rate and rhythm with no murmurs, rubs or  gallops.  Dorsalis pedis pulses 2+ bilaterally.  LUNGS:  Clear to auscultation bilaterally with normal work of breathing  and no wheezes, rales or rhonchi.  ABDOMEN:  Normoactive bowel sounds.  Soft, nontender, nondistended.  Mildly obese.  EXTREMITIES:  No clubbing, cyanosis or edema.  NEUROLOGIC:  Grossly intact.   ASSESSMENT/PLAN:  This is a 36 year old G5, P3-0-2-3 with severe  disabling dysmenorrhea for approximately 5-6 months' duration, who  presents for followup.  Dysmenorrhea:  Based on the patient's last  office visit with Dr. Okey Dupre and her desire to not continue on Depo-  Provera, will schedule for hydrothermal ablation, as previously  discussed with Dr. Okey Dupre.  Patient will meet with the surgical scheduler  today to schedule that surgery, and further followup will be scheduled  as needed.     ______________________________  Tinnie Gens, MD    ______________________________  Tinnie Gens, MD    TP/MEDQ  D:  10/10/2007  T:  10/10/2007  Job:  [id number]

## 2010-11-08 NOTE — Op Note (Signed)
NAMEMAGALLY, VAHLE            ACCOUNT NO.:  [id number]   MEDICAL RECORD NO.:  [id number]          PATIENT TYPE:  AMB   LOCATION:  [facility]                           [facility]   PHYSICIAN:  Phil D. Okey Dupre, M.D.     DATE OF BIRTH:  Jun 15, 1976   DATE OF PROCEDURE:  11/11/2007  DATE OF DISCHARGE:                               OPERATIVE REPORT   PROCEDURE:  Hysteroscopy and hydrothermal endometrial ablation.   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   SURGEON:  Javier Glazier. Okey Dupre, MD   ANESTHESIA:  General plus local.   SPECIMENS TO PATHOLOGY:  None.   ESTIMATED BLOOD LOSS:  Minimal.   OPERATIVE FINDINGS:  The right uterine horn was easily visualized in its  entirety as well as the ostium on that side.  However, we did not see an  opening into the left rudimentary horn in spite of extensive search, so  I was not sure that her intention was accomplished; however, it may have  been that the circulating fluid may have been able to get inside there.  The procedure went as follows; under satisfactory general anesthesia,  the patient was placed in dorsal lithotomy position.  Perineum and  vagina were prepped and draped in usual sterile manner.  Bimanual pelvic  examination under anesthesia revealed the uterus of normal size,  consistency, deviated to the right.  The left rudimentary horn could not  be palpated nor could the adnexa.  Weighted speculum was placed in the  posterior portion of vagina.  Anterior lip of the cervix was grasped  with single-tooth tenaculum.  The uterine cavity was sounded to a depth  of 10 cm.  Cervical os dilated to #8 Hegar dilator.  The hysteroscope  was inserted into the uterine cavity and findings were as above.  The  protocol for endometrial ablation with hydrothermal method was carried  out without incident for 10 minutes.   Once the temperature was obtained,  cooling down took 1 minute.  The patient was then transferred recovery  room in satisfactory condition having tolerated the procedure well.      Phil D. Okey Dupre, M.D.  Electronically Signed     PDR/MEDQ  D:  11/11/2007  T:  11/12/2007  Job:  [id number]

## 2010-11-08 NOTE — Group Therapy Note (Signed)
NAMELENNIX, ROTUNDO NO.:  [id number]   MEDICAL RECORD NO.:  [id number]          PATIENT TYPE:  WOC   LOCATION:  [facility]                   FACILITY:  [facility]   PHYSICIAN:  Argentina Donovan, MD        DATE OF BIRTH:  Jun 07, 1976   DATE OF SERVICE:                                  CLINIC NOTE   REASON FOR APPOINTMENT:  Persistent left lower quadrant abdominal pain.   HISTORY OF PRESENT ILLNESS:  [name] is a 35 year old gravida 5, para  3-0-2-3 with history of 3 prior cesarean sections and a bicornate uterus  who is status post hysteroscopy and hydrothermal endometrial ablation in  May 2009, who presents today complaining of persistent left lower  quadrant abdominal pain.  She notes a greater than 1 year history of  intermittent episodes of moderate-severe cramping-like pain that occurs  approximately 1-2 episodes per month.  The episodes persist  approximately 2-3 days when they occur and pain is moderately improved  with nonsteroidal anti-inflammatory drugs.  Of note, she has had several  ultrasounds as well as CAT scan of her abdomen and pelvis all of which  have been essentially normal with no significant findings.  Of note, her  most recent ultrasound was done in December 2009 which revealed no  significant findings, however, her ovaries were not visualized and her  uterus was not fully examined due to distended bowel as well as  inability to completely empty the bladder.   PAST MEDICAL HISTORY:  Reviewed as per patient's chart.   PAST SURGICAL HISTORY:  Again reviewed.   MEDICATIONS:  Aleve as needed.   SOCIAL HISTORY:  The patient denies tobacco use.  Of note, the patient  has a 14 pound weight gain in the last 6 months.   PHYSICAL EXAMINATION:  VITAL SIGNS:  Blood pressure is 116/79, heart  rate is 90, temperature is 97.2.  She is 5 feet 3 inches tall and weighs  208 pounds.  ABDOMEN:  Soft.  She has normoactive bowel sounds.  She has some mild  left  lower quadrant tenderness.  There is no guarding.  There is no  rebound.  There is no hepatosplenomegaly.   ASSESSMENT/PLAN:  Left lower quadrant abdominal pain.  The etiology of  this pain is unclear at this time.  The pain could potentially be  related to constipation or some gastrointestinal etiology.  The pain  also potentially could be related to endometriosis or potentially  ovarian cyst, although that is less likely given her previously normal  ultrasounds.  Our plan today is to obtain flat and upright x-rays of her  abdomen to evaluate for constipation or any other intestinal  abnormality.  I gave her a prescription for Naprosyn to take 500 mg  twice daily during her periods of  pain.  The patient will follow up in 2 weeks for further evaluation and  potentially discussion for a diagnostic laparoscopy to rule out  endometriosis.      ______________________________  Odie Sera, D.O.    ______________________________  Argentina Donovan, MD    MC/MEDQ  D:  07/01/2008  T:  07/01/2008  Job:  [id number]

## 2010-11-11 NOTE — Op Note (Signed)
   NAME:  [name], [name]                      ACCOUNT NO.:  [id number]   MEDICAL RECORD NO.:  [id number]                   PATIENT TYPE:  MAT   LOCATION:  [facility]                                 [facility]   PHYSICIAN:  Conni Elliot, M.D.             DATE OF BIRTH:  Aug 17, 1975   DATE OF PROCEDURE:  03/03/2003  DATE OF DISCHARGE:                                 OPERATIVE REPORT   PREOPERATIVE DIAGNOSES:  Prior cesarean delivery x2 at [redacted] weeks gestation  with spontaneous rupture of membranes and a desire for surgical  sterilization.   POSTOPERATIVE DIAGNOSES:  Prior cesarean delivery x2 at [redacted] weeks gestation  with spontaneous rupture of membranes and a desire for surgical  sterilization.   OPERATION/PROCEDURE:  Low transverse cesarean and modified bilateral Pomeroy  tubal ligation.   OPERATIVE ASSISTANT:  Pauline Good, Certified Nurse Midwife.   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  There was significant distortion of the lower  segment due to two prior cesarean deliveries.   OPERATIVE PROCEDURE:  The patient was taken to the operating room, supine  left lateral tilt position and had a spinal anesthetic placed and was  prepped and draped in a sterile fashion.  The low segment Pfannenstiel  incision was made and this was carried through the skin and fascia.  The  rectus muscles were separated.  A bladder flap was created with sharp and  blunt dissection, slowly taking down the scar tissue.  A low transverse  uterine incision was made.  This was extended with bandage scissors,  particularly on the patient's right.  The baby was in the vertex  presentation.  Cord was doubly clamped and cut and handed to the  neonatologist in attendance.  The placenta delivered spontaneously.  The  uterus and bladder flap were closed.  The right fallopian was then  retracted, grasped with a Babcock clamp, followed to its  fimbriated end.  A  segment of tube was brought into the operative field, double suture ligated.  An approximately 2 cm of segment was excised.  Hemostasis was adequate.  The  same procedure was done on the opposite side.  Hemostasis was secured and  adequate.  The anterior peritoneum, fascia and skin were closed in fashion.  Estimated blood loss was approximately 800 cc.                                                Conni Elliot, M.D.    ASG/MEDQ  D:  03/03/2003  T:  03/03/2003  Job:  [id number]

## 2010-11-11 NOTE — Discharge Summary (Signed)
   NAME:  [name], [name]                      ACCOUNT NO.:  [id number]   MEDICAL RECORD NO.:  [id number]                   PATIENT TYPE:  INP   LOCATION:  9101                                 [facility]   PHYSICIAN:  Franklyn Lor, MD                      DATE OF BIRTH:  July 20, 1975   DATE OF ADMISSION:  03/03/2003  DATE OF DISCHARGE:  03/06/2003                                 DISCHARGE SUMMARY   The patient is a 36 year old G5, P1, 1, 1, 2, 2 who presented at 26 weeks  estimated gestational age by first trimester ultrasound with the chief  complaint of spontaneous rupture of membranes.  This patient has had two  prior C-sections at 37-weeks gestation but she presented this time with  spontaneous rupture of membranes and desire for surgical sterilization.  The  patient was given medication for GBS protocol, Penicillin G 5 million units  IV x1, then Penicillin G 2.5 million units IV q.4h.  The patient was O  positive, antibody negative, rubella immune, hepatitis B surface antigen  negative, syphilis negative, HIV negative, GC, chlamydia negative, GBS  negative.  She was febrile with a blood pressure of 127/77 at the time of  admission, having irregular contractions and positive reactive fetal heart  strip.  Clear fluid.   HOSPITAL COURSE:  The patient was taken to the operating room for a cesarean  section and bilateral tubal ligation.  Dr. Geraldine Contras surgeon with Dr.  Lyda Jester, midwife, assisting.  A low transverse C-section and bilateral tubal  ligation were performed with delivery of a viable female.  Estimated blood  loss was less than 800 cc.  Apgar's were 7 and 8 at one and five minutes  respectively, weight 5 pounds, 14 ounces, length 20 inches.  The infant was  circumcised prior to discharge.  The patient had an uneventful postoperative  period.   DISCHARGE MEDICATIONS:  1. Ibuprofen 600 mg q.6h p.r.n. pain.  2. Percocet 5/325 one tablet q.6h p.r.n. pain.  3. Prenatal  vitamins one tablet daily x6 weeks while breast feeding.  4. Iron 325 one tablet daily.   FOLLOW UP:  The patient was instructed to follow up at [facility] in six  weeks.   DISPOSITION:  Discharged to home with baby.  Mom is planning to bottle feed.  Staples were discontinued prior to discharge.                                                  Franklyn Lor, MD    TD/MEDQ  D:  04/23/2003  T:  04/24/2003  Job:  [id number]

## 2010-11-13 NOTE — H&P (Signed)
NAMESILVANA, HOLECEK            ACCOUNT NO.:  [id number]  MEDICAL RECORD NO.:  [id number]           PATIENT TYPE:  E  LOCATION:  [facility]                         FACILITY:  [facility]  PHYSICIAN:  Celso Amy, MD   DATE OF BIRTH:  1976-04-14  DATE OF ADMISSION:  10/23/2010 DATE OF DISCHARGE:                             HISTORY & PHYSICAL   CHIEF COMPLAINT:  Cough.  HISTORY OF PRESENT ILLNESS:  The patient is a 35 year old white female with a past medical history of seizure disorder who presents to the ER with chief complaint of cough.  History of present illness dates back to 5 days ago when patient has had onset of cough and fever.  The patient presented to the ER 2 days ago and was started on azithromycin, but the patient's cough continued to get worse until the patient presented to the ER again today.  Complained of fever.  The patient complained of chest soreness.  No complaint of any sick contacts.  No complaint ofrecent travel.  No complaint of any bird pets at home.  No complaint of bright red blood per rectum.  No complaint of melena or hematemesis.  REVIEW OF SYSTEMS:  Positive for decreased appetite and minimal amount of hemoptysis on coughing.  ALLERGIES:  No known drug allergies.  SOCIAL HISTORY:  Nonsmoker, nondrinker.  Denies illegal drug abuse.  PAST MEDICAL HISTORY: 1. Seizure disorder.  The patient said she had seizure during her     pregnancy, is not on any medication at this time. 2. Chronic pelvic pain.  MEDICATIONS:  The patient is on azithromycin p.o.  PHYSICAL EXAMINATION:  VITAL SIGNS:  Blood pressure 116/71, pulse 71, respiratory rate 20, pulse ox 94% on room air, temperature is 98. GENERAL:  The patient is awake, alert, oriented to time, place, and person, well-built, rather obese, lying comfortably on the gurney. HEENT:  Pupils equally reactive to light and accommodation.  Extraocular movement intact. NECK:  Supple. RESPIRATORY:  No acute  respiratory distress.  The patient does have minimal rhonchi bilaterally, right more than left. CARDIOVASCULAR:  S1 and S2 is regular rate and rhythm.  No murmurs or rubs were appreciated. GI:  Obese.  Bowel sounds present, soft, nontender, nondistended. EXTREMITIES:  No lower extremity edema was seen. CNS:  Cranial nerves II through XII are grossly intact.  No focal motor neurological deficits.  Strength 5/5 equal bilaterally in upper and lower extremities.  LABORATORY DATA:  Sodium 137, potassium 3.6, serum chloride 101, bicarb 9, serum creatinine 1.0, glucose 133.  The patient's WBC 4.9, hemoglobin 12.7, platelets 263.  Chest x-ray shows worsening of bilateral pneumonia.  EKG was  normal.  The patient's pregnancy test was negative.  IMPRESSION: 1. Respiratory:  The patient is being admitted with bilateral     pneumonia.  The patient has failed p.o. azithromycin.  The patient     does not have any risk factor for methicillin-resistant     Staphylococcus aureus, but the patient's glucose is on the higher     side of 133 and on examination, the patient does have to tattoos. 2. Deep vein thrombosis.  We will  keep the patient on deep vein     thrombosis prophylaxis.  PLAN: 1. We will start the patient on IV moxifloxacin. 2. We will get HIV, hemoglobin A1c, and hepatitis profile. 3. The patient's further clinical course depends on how the patient     does during her admission.     Celso Amy, MD     MB/MEDQ  D:  10/23/2010  T:  10/23/2010  Job:  [id number]  Electronically Signed by Celso Amy M.D. on 11/13/2010 02:17:16 PM

## 2010-12-01 NOTE — Discharge Summary (Signed)
[name], [name]            ACCOUNT NO.:  [id number]  MEDICAL RECORD NO.:  [id number]           PATIENT TYPE:  I  LOCATION:  5503                         FACILITY:  [facility]  PHYSICIAN:  Jeoffrey Massed, MD    DATE OF BIRTH:  1976-05-26  DATE OF ADMISSION:  10/23/2010 DATE OF DISCHARGE:  10/24/2010                              DISCHARGE SUMMARY   CONDITION ON DISCHARGE:  The patient is slightly fatigued but alert and oriented.  She tells me she is feeling improved.  Denies any pain.  She still has a cough.  She is ready to go home.  DISCHARGE DIAGNOSIS:  Community-acquired pneumonia.  SECONDARY DIAGNOSES: 1. Seizure disorder, not on any medication at this time. 2. Chronic pelvic pain.  HISTORY OF PRESENT ILLNESS:  The patient is a 35 year old Caucasian female with a history of seizure disorder who had onset of fever and cough 5 days prior to coming to the emergency department for admission. She was started on azithromycin on April 27.  However her symptoms continued to worsen.  She came to the emergency department.  She was admitted with community-acquired pneumonia, started on IV Avelox.  Urine for Legionella antigen was collected and found to be negative.  Chest x- ray in the emergency department showed worsening bilateral lower lobe pneumonia, left greater than right when compared to the previous examination 2 days earlier.  Overnight, the patient received IV fluids and supportive symptomatic treatment with Tussionex.  She feels better this morning and is ready to go home.  LABORATORY FINDINGS:  White count on admission 4.9, hemoglobin 13.3, hematocrit 39.0, platelets 264.  Sodium 137, potassium 3.6, chloride 101, glucose 133, BUN 9, creatinine 1.0, hemoglobin A1c 5.9.  Last month glucose 123.  TSH slightly elevated at 4.704.  Hepatitis panel was drawn.  Hepatitis B surface antigen negative, hepatitis B surface antibody negative, hepatitis B antibody negative.  Urine  was clear of any signs of infection, strep pneumoniae urinary antigen negative and again urine Legionella antigen negative.  PHYSICAL EXAMINATION:  GENERAL:  On discharge; this morning the patient appears slightly fatigue and she has nonproductive cough.  However, she is alert and oriented.  Her demeanor is pleasant, cooperative. VITAL SIGNS:  Her temperature 97.5, pulse 62, respirations 16, blood pressure 93/61, O2 sat is 95% on room air. HEENT:  Head is atraumatic, normocephalic.  Eyes are anicteric with pupils are equal, round, and reactive to light.  Nose shows no nasal discharge or exterior lesions.  Mouth has moist mucous membranes with good dentition. NECK:  Supple with midline trachea.  No JVD.  No lymphadenopathy. CHEST:  Has slightly decreased breath sounds.  No wheezes or crackles to my exam. HEART:  Regular rate and rhythm without obvious murmurs, rubs or gallops. ABDOMEN:  Obese, nondistended, nontender with good bowel sounds. EXTREMITIES:  No clubbing, cyanosis or edema. SKIN:  No rashes, bruises or lesions. NEURO:  Cranial nerves II-XII appear grossly intact.  She has no facial asymmetries.  No obvious focal neuro deficits.  DISCHARGE INSTRUCTIONS:  The patient has been told that she will need 4- 5 days of rest at home.  While  she is being discharged from the hospital, she is still ill and will need rest and antibiotics at home. She is to increase her activity slowly, not to return to work until she is feeling better on approximately Friday May 4 or Monday May 7.  She has been advised to use a cool air humidifier at night in her room. Also she has been prescribed Avelox and albuterol inhaler and Tussionex to help her sleep and reduce her cough at night.    Things to follow up on in the outpatient setting, the patient's TSH is slightly elevated, perhaps an outpatient workup to evaluate if her thyroid function would be appropriate.     Dawn Police,  PA   ______________________________ Jeoffrey Massed, MD    MLY/MEDQ  D:  10/24/2010  T:  10/25/2010  Job:  [id number]  Electronically Signed by Algis Downs PA on 11/22/2010 07:44:14 PM Electronically Signed by Jeoffrey Massed  on 12/01/2010 03:18:22 PM

## 2011-01-23 ENCOUNTER — Inpatient Hospital Stay (INDEPENDENT_AMBULATORY_CARE_PROVIDER_SITE_OTHER)
Admission: RE | Admit: 2011-01-23 | Discharge: 2011-01-23 | Disposition: A | Discharge status: Home / Self Care | Payer: Medicaid Other | Source: Ambulatory Visit | Attending: Emergency Medicine | Admitting: Emergency Medicine

## 2011-01-23 DIAGNOSIS — S61209A Unspecified open wound of unspecified finger without damage to nail, initial encounter: Secondary | ICD-10-CM

## 2011-03-17 LAB — DIFFERENTIAL
Basophils Absolute: 0.1
Basophils Relative: 1
Eosinophils Relative: 0
Monocytes Absolute: 0.3
Neutro Abs: 9.6 — ABNORMAL HIGH

## 2011-03-17 LAB — URINALYSIS, ROUTINE W REFLEX MICROSCOPIC
Bilirubin Urine: NEGATIVE
Hgb urine dipstick: NEGATIVE
Leukocytes, UA: NEGATIVE
Protein, ur: NEGATIVE
Protein, ur: NEGATIVE
Specific Gravity, Urine: >1.03 — ABNORMAL HIGH
Urobilinogen, UA: 0.2
Urobilinogen, UA: 0.2

## 2011-03-17 LAB — CBC
HCT: 38
HCT: 36.5
MCHC: 34.4
MCV: 84.3
Platelets: 318
Platelets: 279
RBC: 4.45
RDW: 14.7
WBC: 11 — ABNORMAL HIGH
WBC: 10.4

## 2011-03-17 LAB — WET PREP, GENITAL: Yeast Wet Prep HPF POC: NONE SEEN

## 2011-03-17 LAB — COMPREHENSIVE METABOLIC PANEL
AST: 16
Albumin: 3.9
Alkaline Phosphatase: 71
BUN: 9
CO2: 24
Chloride: 104
GFR calc non Af Amer: >60
Potassium: 3.8
Total Bilirubin: 0.6

## 2011-03-17 LAB — PREGNANCY, URINE: Preg Test, Ur: NEGATIVE

## 2011-03-17 LAB — URINE MICROSCOPIC-ADD ON

## 2011-03-17 LAB — POCT PREGNANCY, URINE: Operator id: 134401

## 2011-03-22 LAB — CBC
HCT: 40.3
Hemoglobin: 13.8
MCHC: 34.2
MCV: 83.3
Platelets: 279
RBC: 4.84
RDW: 14
WBC: 5.4

## 2011-03-29 ENCOUNTER — Other Ambulatory Visit: Payer: Self-pay | Admitting: Obstetrics & Gynecology

## 2011-03-29 DIAGNOSIS — E8940 Asymptomatic postprocedural ovarian failure: Secondary | ICD-10-CM

## 2011-03-30 ENCOUNTER — Encounter: Payer: Self-pay | Admitting: Obstetrics & Gynecology

## 2011-03-30 LAB — GC/CHLAMYDIA PROBE AMP, GENITAL: GC Probe Amp, Genital: NEGATIVE

## 2011-03-30 LAB — POCT URINALYSIS DIP (DEVICE)
Ketones, ur: NEGATIVE mg/dL
Protein, ur: NEGATIVE mg/dL
Specific Gravity, Urine: 1.025 (ref 1.005–1.030)
Urobilinogen, UA: 0.2 mg/dL (ref 0.0–1.0)

## 2011-03-30 LAB — POCT PREGNANCY, URINE: Preg Test, Ur: NEGATIVE

## 2011-03-31 LAB — DIFFERENTIAL
Basophils Absolute: 0
Basophils Relative: 0
Eosinophils Absolute: 0 — ABNORMAL LOW
Eosinophils Relative: 0
Lymphocytes Relative: 3 — ABNORMAL LOW
Lymphs Abs: 0.5 — ABNORMAL LOW
Monocytes Absolute: 0.6
Monocytes Relative: 4
Neutro Abs: 14.7 — ABNORMAL HIGH
Neutrophils Relative %: 93 — ABNORMAL HIGH

## 2011-03-31 LAB — POCT URINALYSIS DIP (DEVICE)
Glucose, UA: NEGATIVE
Nitrite: NEGATIVE
Operator id: 235561
Specific Gravity, Urine: 1.02
Urobilinogen, UA: 0.2

## 2011-03-31 LAB — COMPREHENSIVE METABOLIC PANEL
ALT: 11
AST: 16
Albumin: 3.8
Alkaline Phosphatase: 72
BUN: 8
CO2: 20
Calcium: 8.3 — ABNORMAL LOW
Chloride: 97
Creatinine, Ser: 0.85
GFR calc Af Amer: >60
GFR calc non Af Amer: >60
Glucose, Bld: 117 — ABNORMAL HIGH
Potassium: 3.4 — ABNORMAL LOW
Sodium: 132 — ABNORMAL LOW
Total Bilirubin: 0.8
Total Protein: 7.3

## 2011-03-31 LAB — CBC
HCT: 42.3
Hemoglobin: 14.4
MCHC: 34
MCV: 82.8
Platelets: 310
RBC: 5.1
RDW: 13.5
WBC: 15.9 — ABNORMAL HIGH

## 2011-03-31 LAB — POCT RAPID STREP A: Streptococcus, Group A Screen (Direct): POSITIVE — AB

## 2011-04-03 LAB — URINE MICROSCOPIC-ADD ON

## 2011-04-03 LAB — URINALYSIS, ROUTINE W REFLEX MICROSCOPIC
Bilirubin Urine: NEGATIVE
Glucose, UA: NEGATIVE
Ketones, ur: NEGATIVE
Nitrite: NEGATIVE
Protein, ur: NEGATIVE
Specific Gravity, Urine: 1.011
Urobilinogen, UA: 0.2
pH: 6

## 2011-04-03 LAB — POCT PREGNANCY, URINE
Operator id: 294501
Preg Test, Ur: NEGATIVE

## 2011-04-05 LAB — POCT PREGNANCY, URINE
Operator id: 247071
Preg Test, Ur: NEGATIVE

## 2011-04-05 LAB — POCT URINALYSIS DIP (DEVICE)
Bilirubin Urine: NEGATIVE
Glucose, UA: NEGATIVE
Hgb urine dipstick: NEGATIVE
Ketones, ur: NEGATIVE
Nitrite: NEGATIVE
Operator id: 116391
Protein, ur: NEGATIVE
Specific Gravity, Urine: 1.015
Urobilinogen, UA: 0.2
pH: 6

## 2011-04-05 LAB — GC/CHLAMYDIA PROBE AMP, GENITAL
Chlamydia, DNA Probe: NEGATIVE
GC Probe Amp, Genital: NEGATIVE

## 2011-04-05 LAB — WET PREP, GENITAL: Yeast Wet Prep HPF POC: NONE SEEN

## 2011-04-07 LAB — POCT URINALYSIS DIP (DEVICE)
Bilirubin Urine: NEGATIVE
Glucose, UA: NEGATIVE
Nitrite: NEGATIVE

## 2011-04-07 LAB — POCT PREGNANCY, URINE
Operator id: 247071
Preg Test, Ur: NEGATIVE

## 2011-04-07 LAB — I-STAT 8, (EC8 V) (CONVERTED LAB)
BUN: 8
Bicarbonate: 26.8 — ABNORMAL HIGH
Chloride: 103
HCT: 42
Hemoglobin: 14.3
Operator id: 247071
Sodium: 139

## 2011-04-07 LAB — POCT I-STAT CREATININE: Creatinine, Ser: 0.9

## 2011-10-24 ENCOUNTER — Other Ambulatory Visit: Payer: Self-pay | Admitting: Family Medicine

## 2011-10-24 DIAGNOSIS — R51 Headache: Secondary | ICD-10-CM

## 2011-10-26 ENCOUNTER — Ambulatory Visit
Admission: RE | Admit: 2011-10-26 | Discharge: 2011-10-26 | Disposition: A | Discharge status: Home / Self Care | Payer: Medicaid Other | Source: Ambulatory Visit | Attending: Family Medicine | Admitting: Family Medicine

## 2011-10-26 DIAGNOSIS — R51 Headache: Secondary | ICD-10-CM

## 2012-01-15 ENCOUNTER — Encounter (HOSPITAL_COMMUNITY): Payer: Self-pay | Admitting: Emergency Medicine

## 2012-01-15 ENCOUNTER — Emergency Department (HOSPITAL_COMMUNITY)
Admission: EM | Admit: 2012-01-15 | Discharge: 2012-01-15 | Discharge status: Against Medical Advice | Payer: Medicaid Other | Attending: Emergency Medicine | Admitting: Emergency Medicine

## 2012-01-15 DIAGNOSIS — R109 Unspecified abdominal pain: Secondary | ICD-10-CM | POA: Insufficient documentation

## 2012-01-15 DIAGNOSIS — R110 Nausea: Secondary | ICD-10-CM | POA: Insufficient documentation

## 2012-01-15 DIAGNOSIS — M545 Low back pain, unspecified: Secondary | ICD-10-CM | POA: Insufficient documentation

## 2012-01-15 DIAGNOSIS — R209 Unspecified disturbances of skin sensation: Secondary | ICD-10-CM | POA: Insufficient documentation

## 2012-01-15 HISTORY — DX: Unspecified asthma, uncomplicated: J45.909

## 2012-01-15 HISTORY — DX: Unspecified convulsions: R56.9

## 2012-01-15 LAB — URINALYSIS, ROUTINE W REFLEX MICROSCOPIC
Bilirubin Urine: NEGATIVE
Hgb urine dipstick: NEGATIVE
Ketones, ur: NEGATIVE mg/dL
Nitrite: NEGATIVE
pH: 5.5 (ref 5.0–8.0)

## 2012-01-15 NOTE — ED Notes (Signed)
Reports lower back pain, lower abd pain, nausea, and feels like R leg numb- all symptoms started today; denies pain with urination; denies injury; pt a&ox4; ambulatory at triage; pt grips equal, face symmetrical, no drift noted

## 2012-01-15 NOTE — ED Notes (Signed)
Pt states she would like to leave, states she will followup with her PCP in the am, AMA form signed, pt encouraged to stay but states she just wants to leave

## 2012-01-16 ENCOUNTER — Other Ambulatory Visit: Payer: Self-pay | Admitting: Family Medicine

## 2012-01-16 DIAGNOSIS — R109 Unspecified abdominal pain: Secondary | ICD-10-CM

## 2012-01-17 ENCOUNTER — Ambulatory Visit
Admission: RE | Admit: 2012-01-17 | Discharge: 2012-01-17 | Disposition: A | Discharge status: Home / Self Care | Payer: Medicaid Other | Source: Ambulatory Visit | Attending: Family Medicine | Admitting: Family Medicine

## 2012-01-17 DIAGNOSIS — R109 Unspecified abdominal pain: Secondary | ICD-10-CM

## 2012-01-17 MED ORDER — IOHEXOL 300 MG/ML  SOLN
125.0000 mL | Freq: Once | INTRAMUSCULAR | Status: AC | PRN
  Administered 2012-01-17: 125 mL via INTRAVENOUS

## 2012-02-11 ENCOUNTER — Emergency Department (HOSPITAL_COMMUNITY)
Admission: EM | Admit: 2012-02-11 | Discharge: 2012-02-11 | Discharge status: Against Medical Advice | Payer: Medicaid Other | Attending: Emergency Medicine | Admitting: Emergency Medicine

## 2012-02-11 ENCOUNTER — Encounter (HOSPITAL_COMMUNITY): Payer: Self-pay | Admitting: Emergency Medicine

## 2012-02-11 DIAGNOSIS — R319 Hematuria, unspecified: Secondary | ICD-10-CM | POA: Insufficient documentation
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
## 2012-05-03 ENCOUNTER — Encounter (HOSPITAL_COMMUNITY): Payer: Self-pay | Admitting: Emergency Medicine

## 2012-05-03 ENCOUNTER — Emergency Department (HOSPITAL_COMMUNITY)
Admission: EM | Admit: 2012-05-03 | Discharge: 2012-05-04 | Disposition: A | Discharge status: Home / Self Care | Payer: Medicaid Other | Attending: Emergency Medicine | Admitting: Emergency Medicine

## 2012-05-03 DIAGNOSIS — G40909 Epilepsy, unspecified, not intractable, without status epilepticus: Secondary | ICD-10-CM | POA: Insufficient documentation

## 2012-05-03 DIAGNOSIS — J45909 Unspecified asthma, uncomplicated: Secondary | ICD-10-CM | POA: Insufficient documentation

## 2012-05-03 DIAGNOSIS — R109 Unspecified abdominal pain: Secondary | ICD-10-CM | POA: Insufficient documentation

## 2012-05-03 DIAGNOSIS — Z79899 Other long term (current) drug therapy: Secondary | ICD-10-CM | POA: Insufficient documentation

## 2012-05-03 DIAGNOSIS — R112 Nausea with vomiting, unspecified: Secondary | ICD-10-CM | POA: Insufficient documentation

## 2012-05-03 LAB — COMPREHENSIVE METABOLIC PANEL
Albumin: 3.6 g/dL (ref 3.5–5.2)
BUN: 9 mg/dL (ref 6–23)
Creatinine, Ser: 0.88 mg/dL (ref 0.50–1.10)
GFR calc Af Amer: >90 mL/min (ref >90)
Glucose, Bld: 101 mg/dL — ABNORMAL HIGH (ref 70–99)
Total Protein: 7.2 g/dL (ref 6.0–8.3)

## 2012-05-03 LAB — CBC WITH DIFFERENTIAL/PLATELET
Basophils Relative: 1 % (ref 0–1)
Eosinophils Absolute: 0.1 10*3/uL (ref 0.0–0.7)
Eosinophils Relative: 1 % (ref 0–5)
HCT: 40.1 % (ref 36.0–46.0)
Hemoglobin: 13.4 g/dL (ref 12.0–15.0)
MCH: 27.6 pg (ref 26.0–34.0)
MCHC: 33.4 g/dL (ref 30.0–36.0)
MCV: 82.7 fL (ref 78.0–100.0)
Monocytes Absolute: 0.8 10*3/uL (ref 0.1–1.0)
Monocytes Relative: 8 % (ref 3–12)
Neutrophils Relative %: 65 % (ref 43–77)

## 2012-05-03 NOTE — ED Notes (Signed)
Pt alert, arrives from home, c/o luq abd pain, onset was today, c/o nausea associated with, describes as sharp radiating to left lower abd, denies changes in bowel or bladder, resp even unlabored,. Skin pwd

## 2012-05-04 ENCOUNTER — Emergency Department (HOSPITAL_COMMUNITY): Payer: Medicaid Other

## 2012-05-04 LAB — URINALYSIS, MICROSCOPIC ONLY
Glucose, UA: NEGATIVE mg/dL
Leukocytes, UA: NEGATIVE
Nitrite: NEGATIVE
Protein, ur: NEGATIVE mg/dL
pH: 6 (ref 5.0–8.0)

## 2012-05-04 LAB — PREGNANCY, URINE: Preg Test, Ur: NEGATIVE

## 2012-05-04 MED ORDER — HYDROCODONE-ACETAMINOPHEN 5-325 MG PO TABS: 1.0000 | ORAL_TABLET | Freq: Four times a day (QID) | ORAL | Status: DC | PRN

## 2012-05-04 MED ORDER — OXYCODONE-ACETAMINOPHEN 5-325 MG PO TABS
1.0000 | ORAL_TABLET | Freq: Once | ORAL | Status: AC
  Administered 2012-05-04: 1 via ORAL
  Filled 2012-05-04: qty 1

## 2012-05-04 MED ORDER — ONDANSETRON 8 MG PO TBDP
8.0000 mg | ORAL_TABLET | Freq: Once | ORAL | Status: AC
  Administered 2012-05-04: 8 mg via ORAL
  Filled 2012-05-04: qty 1

## 2012-05-04 NOTE — ED Provider Notes (Signed)
History     CSN: [id number]  Arrival date & time 05/03/12  2220   First MD Initiated Contact with Patient 05/04/12 0123      Chief Complaint  Patient presents with  . Abdominal Pain    (Consider location/radiation/quality/duration/timing/severity/associated sxs/prior treatment) HPI Comments: Patient is a 36 year old female with a history of asthma and abdominal hysterectomy and that presents emergency department with a chief complaint of abdominal pain.  Onset of symptoms began earlier this morning and has been gradually worsening.  Pain location was in the right lower quadrant and right flank area radiating up to the right upper quadrant and back down.  Patient states pain is worse with with any movement.  Severity is 8/10.  Associated symptoms include nausea and emesis x1.  Last normal bowel movement was earlier today just prior to arrival.  Patient denies any fevers, night sweats, chills, dysuria, abnormal vaginal discharge, change in bowel movements, chest pain, shortness of breath, cough, or trauma.  Patient is a 36 y.o. female presenting with abdominal pain. The history is provided by the patient.  Abdominal Pain The primary symptoms of the illness include abdominal pain, nausea and vomiting. The primary symptoms of the illness do not include fever, shortness of breath, diarrhea or dysuria.  Symptoms associated with the illness do not include chills, constipation, urgency or hematuria.    Past Medical History  Diagnosis Date  . Asthma   . Seizures     Past Surgical History  Procedure Date  . Cesarean section   . Abdominal hysterectomy   . Tympanostomy tube placement     No family history on file.  History  Substance Use Topics  . Smoking status: Never Smoker   . Smokeless tobacco: Not on file  . Alcohol Use: No    OB History    Grav Para Term Preterm Abortions TAB SAB Ect Mult Living                  Review of Systems  Constitutional: Positive for appetite  change. Negative for fever and chills.  HENT: Negative for neck stiffness and dental problem.   Eyes: Negative for visual disturbance.  Respiratory: Negative for cough, chest tightness, shortness of breath and wheezing.   Cardiovascular: Negative for chest pain.  Gastrointestinal: Positive for nausea, vomiting and abdominal pain. Negative for diarrhea, constipation, blood in stool, abdominal distention, anal bleeding and rectal pain.  Genitourinary: Negative for dysuria, urgency, hematuria and flank pain.  Musculoskeletal: Negative for myalgias and arthralgias.  Skin: Negative for rash.  Neurological: Negative for dizziness, syncope, speech difficulty, numbness and headaches.  Hematological: Does not bruise/bleed easily.  All other systems reviewed and are negative.    Allergies  Review of patient's allergies indicates no known allergies.  Home Medications   Current Outpatient Rx  Name  Route  Sig  Dispense  Refill  . ESTRADIOL 1 MG PO TABS      TAKE ONE TABLET BY MOUTH EVERY DAY   30 tablet   12     BP 123/67  Pulse 77  Temp 97.9 F (36.6 C) (Oral)  Resp 17  SpO2 100%  Physical Exam  Constitutional: She is oriented to person, place, and time. She appears well-developed and well-nourished. No distress.  HENT:  Head: Normocephalic and atraumatic.  Mouth/Throat: Oropharynx is clear and moist. No oropharyngeal exudate.  Eyes: Conjunctivae normal and EOM are normal. Pupils are equal, round, and reactive to light. No scleral icterus.  Neck: Normal range  of motion. Neck supple. No tracheal deviation present. No thyromegaly present.  Cardiovascular: Normal rate, regular rhythm, normal heart sounds and intact distal pulses.   Pulmonary/Chest: Effort normal and breath sounds normal. No stridor. No respiratory distress. She has no wheezes.  Abdominal: Soft.       Tenderness to palpation along epigastric, right upper quadrant, and right lower quadrant.  No rebound tenderness,  masses or guarding.  Bowel sounds present and normal.  Abdomen obese.  Musculoskeletal: Normal range of motion. She exhibits no edema and no tenderness.  Neurological: She is alert and oriented to person, place, and time. Coordination normal.  Skin: Skin is warm and dry. No rash noted. She is not diaphoretic. No erythema. No pallor.  Psychiatric: She has a normal mood and affect. Her behavior is normal.    ED Course  Procedures (including critical care time)  Labs Reviewed  COMPREHENSIVE METABOLIC PANEL - Abnormal; Notable for the following:    Sodium 134 (*)     Glucose, Bld 101 (*)     Total Bilirubin 0.1 (*)     GFR calc non Af Amer 84 (*)     All other components within normal limits  CBC WITH DIFFERENTIAL  URINALYSIS, MICROSCOPIC ONLY  PREGNANCY, URINE   Ct Abdomen Pelvis Wo Contrast  05/04/2012  *RADIOLOGY REPORT*  Clinical Data: Flank pain, left lower quadrant abdominal pain  CT ABDOMEN AND PELVIS WITHOUT CONTRAST  Technique:  Multidetector CT imaging of the abdomen and pelvis was performed following the standard protocol without intravenous contrast.  Comparison: CT abdomen pelvis - 01/17/2012  Findings:  The lack of intravenous contrast limits the ability to evaluate solid abdominal organs.  Normal noncontrast appearance of the bilateral kidneys.  No renal stones.  No evidence of urinary obstruction.  No stones are seen within the expected location of either ureter or within the urinary bladder.  No perinephric stranding.  Normal hepatic contour.  The gallbladder is underdistended but otherwise normal.  No ascites.  Normal noncontrast appearance of the bilateral adrenal glands, pancreas and spleen.  Colonic diverticulosis without evidence of diverticulitis on this noncontrast examination.  Normal noncontrast appearance of the appendix.  No pneumoperitoneum, pneumatosis or portal venous gas.  Normal caliber abdominal aorta.  No retroperitoneal, mesenteric, pelvic or inguinal  lymphadenopathy.  Post hysterectomy.  No discrete adnexal lesion.  No free fluid in the pelvis.  Limited visualization of the lower thorax is negative for focal airspace opacity or pleural effusion.  Normal heart size.  No pericardial effusion.  No acute or aggressive osseous abnormalities.  Bone island incidentally noted within the right pubis.  IMPRESSION: No explanation for patient's left sided abdominal pain. Specifically, no evidence of enteric or urinary obstruction. Normal noncontrast appearance of the appendix.   Original Report Authenticated By: Tacey Ruiz, MD    US Abdomen Complete  05/04/2012  *RADIOLOGY REPORT*  Clinical Data:  Right upper quadrant abdominal pain.  ABDOMINAL ULTRASOUND COMPLETE  Comparison:  CT of the abdomen and pelvis performed earlier today at 01:52 a.m.  Findings:  Gallbladder:  The gallbladder is normal in appearance, without evidence for gallstones, gallbladder wall thickening or pericholecystic fluid.  No ultrasonographic Murphy's sign is elicited.  Common Bile Duct:  0.4 cm in diameter; within normal limits in caliber.  Liver:  Normal parenchymal echogenicity and echotexture; no focal lesions identified.  Limited Doppler evaluation demonstrates normal blood flow within the liver.  IVC:  Unremarkable in appearance.  Pancreas:  Although the pancreas is  difficult to visualize due to overlying bowel gas, no focal pancreatic abnormality is identified.  Spleen:  9.1 cm in length; within normal limits in size and echotexture.  Right kidney:  11.7 cm in length; normal in size, configuration and parenchymal echogenicity.  No evidence of mass or hydronephrosis.  Left kidney:  11.0 cm in length; normal in size, configuration and parenchymal echogenicity.  No evidence of mass or hydronephrosis.  Abdominal Aorta:  Normal in caliber; no aneurysm identified.  Not visualized distally due to overlying bowel gas.  IMPRESSION: Unremarkable abdominal ultrasound.   Original Report Authenticated  By: Tonia Ghent, M.D.      No diagnosis found.    MDM  abd pain  36 yo F to ER c/o left upper & lower abd pain. Patient is nontoxic, nonseptic appearing, in no apparent distress.  Patient's pain and other symptoms adequately managed in emergency department.  Fluid bolus given.  Labs, imaging and vitals reviewed.  Patient does not meet the SIRS or Sepsis criteria.  On repeat exam patient does not have a surgical abdomin and there are no peritoneal signs.  After labs, imaging and PE low concern for appendicitis, bowel obstruction, bowel perforation, cholecystitis, diverticulitis, PID or ectopic pregnancy.  Patient discharged home with symptomatic treatment and given strict instructions for follow-up with their primary care physician.  I have also discussed reasons to return immediately to the ER.  Patient expresses understanding and agrees with plan.           Jaci Carrel, [place] 05/04/12 [phone]

## 2012-05-04 NOTE — ED Provider Notes (Signed)
Medical screening examination/treatment/procedure(s) were performed by non-physician practitioner and as supervising physician I was immediately available for consultation/collaboration.  Jasmine Awe, MD 05/04/12 [phone]

## 2012-05-04 NOTE — ED Notes (Signed)
During hourly update pt asked ab pain medication. Informed pt of her options to have an IV placed. Pt was reluctant to IV. Pt offered pain med by mouth and was agreeable to that option.

## 2012-10-04 ENCOUNTER — Other Ambulatory Visit (HOSPITAL_COMMUNITY): Payer: Self-pay | Admitting: Family Medicine

## 2012-10-04 ENCOUNTER — Other Ambulatory Visit: Payer: Self-pay | Admitting: Family Medicine

## 2012-10-04 DIAGNOSIS — R112 Nausea with vomiting, unspecified: Secondary | ICD-10-CM

## 2012-10-04 DIAGNOSIS — R109 Unspecified abdominal pain: Secondary | ICD-10-CM

## 2013-03-25 ENCOUNTER — Emergency Department (HOSPITAL_COMMUNITY)
Admission: EM | Admit: 2013-03-25 | Discharge: 2013-03-26 | Disposition: A | Discharge status: Home / Self Care | Payer: Self-pay | Attending: Emergency Medicine | Admitting: Emergency Medicine

## 2013-03-25 ENCOUNTER — Encounter (HOSPITAL_COMMUNITY): Payer: Self-pay | Admitting: Emergency Medicine

## 2013-03-25 DIAGNOSIS — Z8669 Personal history of other diseases of the nervous system and sense organs: Secondary | ICD-10-CM | POA: Insufficient documentation

## 2013-03-25 DIAGNOSIS — R109 Unspecified abdominal pain: Secondary | ICD-10-CM | POA: Insufficient documentation

## 2013-03-25 DIAGNOSIS — Z79899 Other long term (current) drug therapy: Secondary | ICD-10-CM | POA: Insufficient documentation

## 2013-03-25 DIAGNOSIS — R112 Nausea with vomiting, unspecified: Secondary | ICD-10-CM | POA: Insufficient documentation

## 2013-03-25 DIAGNOSIS — J45909 Unspecified asthma, uncomplicated: Secondary | ICD-10-CM | POA: Insufficient documentation

## 2013-03-25 LAB — CBC WITH DIFFERENTIAL/PLATELET
Basophils Absolute: 0 10*3/uL (ref 0.0–0.1)
Basophils Relative: 1 % (ref 0–1)
Eosinophils Absolute: 0.1 10*3/uL (ref 0.0–0.7)
Eosinophils Relative: 2 % (ref 0–5)
HCT: 39.3 % (ref 36.0–46.0)
Hemoglobin: 13 g/dL (ref 12.0–15.0)
Lymphocytes Relative: 31 % (ref 12–46)
Lymphs Abs: 1.8 10*3/uL (ref 0.7–4.0)
MCH: 26.9 pg (ref 26.0–34.0)
MCHC: 33.1 g/dL (ref 30.0–36.0)
MCV: 81.4 fL (ref 78.0–100.0)
Monocytes Absolute: 0.4 10*3/uL (ref 0.1–1.0)
Monocytes Relative: 7 % (ref 3–12)
Neutro Abs: 3.4 10*3/uL (ref 1.7–7.7)
Neutrophils Relative %: 59 % (ref 43–77)
Platelets: 296 10*3/uL (ref 150–400)
RBC: 4.83 MIL/uL (ref 3.87–5.11)
RDW: 14.3 % (ref 11.5–15.5)
WBC: 5.6 10*3/uL (ref 4.0–10.5)

## 2013-03-25 LAB — COMPREHENSIVE METABOLIC PANEL
ALT: 15 U/L (ref 0–35)
AST: 17 U/L (ref 0–37)
Albumin: 3.5 g/dL (ref 3.5–5.2)
Alkaline Phosphatase: 89 U/L (ref 39–117)
BUN: 10 mg/dL (ref 6–23)
CO2: 26 mEq/L (ref 19–32)
Calcium: 9.1 mg/dL (ref 8.4–10.5)
Chloride: 100 mEq/L (ref 96–112)
Creatinine, Ser: 0.74 mg/dL (ref 0.50–1.10)
GFR calc Af Amer: >90 mL/min (ref >90)
GFR calc non Af Amer: >90 mL/min (ref >90)
Glucose, Bld: 99 mg/dL (ref 70–99)
Potassium: 3.6 mEq/L (ref 3.5–5.1)
Sodium: 135 mEq/L (ref 135–145)
Total Bilirubin: 0.2 mg/dL — ABNORMAL LOW (ref 0.3–1.2)
Total Protein: 7.1 g/dL (ref 6.0–8.3)

## 2013-03-25 LAB — URINALYSIS, ROUTINE W REFLEX MICROSCOPIC
Bilirubin Urine: NEGATIVE
Glucose, UA: NEGATIVE mg/dL
Ketones, ur: NEGATIVE mg/dL
Leukocytes, UA: NEGATIVE
Protein, ur: NEGATIVE mg/dL
pH: 6.5 (ref 5.0–8.0)

## 2013-03-25 LAB — LIPASE, BLOOD: Lipase: 16 U/L (ref 11–59)

## 2013-03-25 MED ORDER — SODIUM CHLORIDE 0.9 % IV BOLUS (SEPSIS)
1000.0000 mL | Freq: Once | INTRAVENOUS | Status: AC
  Administered 2013-03-25: 1000 mL via INTRAVENOUS

## 2013-03-25 NOTE — ED Notes (Signed)
Pt states she is having pain in her stomach that started yesterday and then today the pain radiates around to her back   Pt states she has nausea and had vomiting earlier

## 2013-03-25 NOTE — ED Notes (Signed)
Pt states she has been having headaches more than normal lately

## 2013-03-26 ENCOUNTER — Emergency Department (HOSPITAL_COMMUNITY): Payer: PRIVATE HEALTH INSURANCE

## 2013-03-26 MED ORDER — ONDANSETRON HCL 4 MG/2ML IJ SOLN
INTRAMUSCULAR | Status: AC
  Administered 2013-03-26: 4 mg via INTRAVENOUS
  Filled 2013-03-26: qty 2

## 2013-03-26 MED ORDER — MORPHINE SULFATE 4 MG/ML IJ SOLN
INTRAMUSCULAR | Status: AC
  Administered 2013-03-26: 4 mg via INTRAVENOUS
  Filled 2013-03-26: qty 1

## 2013-03-26 MED ORDER — MORPHINE SULFATE 4 MG/ML IJ SOLN
6.0000 mg | Freq: Once | INTRAMUSCULAR | Status: AC
  Administered 2013-03-26: 4 mg via INTRAVENOUS

## 2013-03-26 MED ORDER — ONDANSETRON HCL 4 MG/2ML IJ SOLN
4.0000 mg | Freq: Once | INTRAMUSCULAR | Status: AC
  Administered 2013-03-26: 4 mg via INTRAVENOUS

## 2013-03-26 MED ORDER — MORPHINE SULFATE 2 MG/ML IJ SOLN
INTRAMUSCULAR | Status: AC
  Administered 2013-03-26: 2 mg via INTRAVENOUS
  Filled 2013-03-26: qty 1

## 2013-03-26 MED ORDER — HYDROCODONE-ACETAMINOPHEN 5-325 MG PO TABS: 1.0000 | ORAL_TABLET | Freq: Four times a day (QID) | ORAL | Status: DC | PRN

## 2013-03-26 MED ORDER — PROMETHAZINE HCL 25 MG PO TABS: 25.0000 mg | ORAL_TABLET | Freq: Three times a day (TID) | ORAL | Status: DC | PRN

## 2013-03-26 NOTE — ED Notes (Signed)
Patient states that she is still having LLQ abdominal pain and that pain continues to radiate into her back. Will medicate with pain medication as ordered and will monitor.

## 2013-03-26 NOTE — ED Provider Notes (Signed)
Medical screening examination/treatment/procedure(s) were conducted as a shared visit with non-physician practitioner(s) and myself.  I personally evaluated the patient during the encounter  Upper ABD pain mostly L sided ongoing and recurrent for weeks to months. On exam mild LUQ TTP, no lower ABD pain or tenderness. Labs, UA and Ct reviewed WNL.  Given exam and findings with pain improved, plan d/c home and follow up PCP.  PT prefers to follow up with her doctor for GI referral  Results for orders placed during the hospital encounter of 03/25/13  URINALYSIS, ROUTINE W REFLEX MICROSCOPIC      Result Value Range   Color, Urine YELLOW  YELLOW   APPearance CLEAR  CLEAR   Specific Gravity, Urine 1.016  1.005 - 1.030   pH 6.5  5.0 - 8.0   Glucose, UA NEGATIVE  NEGATIVE mg/dL   Hgb urine dipstick NEGATIVE  NEGATIVE   Bilirubin Urine NEGATIVE  NEGATIVE   Ketones, ur NEGATIVE  NEGATIVE mg/dL   Protein, ur NEGATIVE  NEGATIVE mg/dL   Urobilinogen, UA 0.2  0.0 - 1.0 mg/dL   Nitrite NEGATIVE  NEGATIVE   Leukocytes, UA NEGATIVE  NEGATIVE  CBC WITH DIFFERENTIAL      Result Value Range   WBC 5.6  4.0 - 10.5 K/uL   RBC 4.83  3.87 - 5.11 MIL/uL   Hemoglobin 13.0  12.0 - 15.0 g/dL   HCT 16.1  09.6 - 04.5 %   MCV 81.4  78.0 - 100.0 fL   MCH 26.9  26.0 - 34.0 pg   MCHC 33.1  30.0 - 36.0 g/dL   RDW 40.9  81.1 - 91.4 %   Platelets 296  150 - 400 K/uL   Neutrophils Relative % 59  43 - 77 %   Neutro Abs 3.4  1.7 - 7.7 K/uL   Lymphocytes Relative 31  12 - 46 %   Lymphs Abs 1.8  0.7 - 4.0 K/uL   Monocytes Relative 7  3 - 12 %   Monocytes Absolute 0.4  0.1 - 1.0 K/uL   Eosinophils Relative 2  0 - 5 %   Eosinophils Absolute 0.1  0.0 - 0.7 K/uL   Basophils Relative 1  0 - 1 %   Basophils Absolute 0.0  0.0 - 0.1 K/uL  COMPREHENSIVE METABOLIC PANEL      Result Value Range   Sodium 135  135 - 145 mEq/L   Potassium 3.6  3.5 - 5.1 mEq/L   Chloride 100  96 - 112 mEq/L   CO2 26  19 - 32 mEq/L   Glucose,  Bld 99  70 - 99 mg/dL   BUN 10  6 - 23 mg/dL   Creatinine, Ser 7.82  0.50 - 1.10 mg/dL   Calcium 9.1  8.4 - 95.6 mg/dL   Total Protein 7.1  6.0 - 8.3 g/dL   Albumin 3.5  3.5 - 5.2 g/dL   AST 17  0 - 37 U/L   ALT 15  0 - 35 U/L   Alkaline Phosphatase 89  39 - 117 U/L   Total Bilirubin 0.2 (*) 0.3 - 1.2 mg/dL   GFR calc non Af Amer >90  >90 mL/min   GFR calc Af Amer >90  >90 mL/min  LIPASE, BLOOD      Result Value Range   Lipase 16  11 - 59 U/L   Ct Abdomen Pelvis Wo Contrast  03/26/2013   CLINICAL DATA:  Abdominal pain. Back pain.  EXAM: CT ABDOMEN  AND PELVIS WITHOUT CONTRAST  TECHNIQUE: Multidetector CT imaging of the abdomen and pelvis was performed following the standard protocol without intravenous contrast.  COMPARISON:  07/22/2009.  FINDINGS: The lung bases are clear except for left basilar atelectasis.  The unenhanced appearance of the liver and spleen are unremarkable. The gallbladder is normal. No common bile duct dilatation. The pancreas is normal. These spleen is normal in size. The adrenal glands and kidneys are normal. No hydronephrosis or renal calculi. No obstructing ureteral calculi. No bladder calculi.  The stomach, duodenum, small bowel and colon are unremarkable without oral contrast. No inflammatory changes or mass lesions. No obstructive findings. The appendix is normal. Small scattered mesenteric and retroperitoneal lymph nodes appears stable. No mass or adenopathy. The aorta is normal.  The uterus is surgically absent. No pelvic mass, adenopathy or free pelvic fluid collections. No inguinal mass or adenopathy. Scattered borderline inguinal lymph nodes are noted but these appears stable.  The bony structures are unremarkable.  IMPRESSION: Negative noncontrast abdominal/pelvic CT scan.   Electronically Signed   By: Loralie Champagne M.D.   On: 03/26/2013 01:31      Sunnie Nielsen, MD 03/26/13 [phone]

## 2013-03-26 NOTE — ED Provider Notes (Signed)
CSN: [id number]     Arrival date & time 03/25/13  2009 History   First MD Initiated Contact with Patient 03/25/13 2223     Chief Complaint  Patient presents with  . Abdominal Pain   (Consider location/radiation/quality/duration/timing/severity/associated sxs/prior Treatment) HPI Patient presents emergency department with left-sided abdominal pain, that wraps to her back and lower abdomen.  Patient, states it started yesterday.  But started having vomiting, earlier this evening.  Patient denies chest pain, shortness breath, headache, weakness, dizziness, syncope, fever, diarrhea, dysuria, or syncope.  The patient, states she did not take any medications prior to arrival.  Patient, states nothing seems to make her condition, better or worse. Past Medical History  Diagnosis Date  . Asthma   . Seizures    Past Surgical History  Procedure Laterality Date  . Cesarean section    . Abdominal hysterectomy    . Tympanostomy tube placement     Family History  Problem Relation Age of Onset  . Hypertension Mother   . Cancer Other    History  Substance Use Topics  . Smoking status: Never Smoker   . Smokeless tobacco: Not on file  . Alcohol Use: No   OB History   Grav Para Term Preterm Abortions TAB SAB Ect Mult Living                 Review of Systems All other systems negative except as documented in the HPI. All pertinent positives and negatives as reviewed in the HPI. Allergies  Review of patient's allergies indicates no known allergies.  Home Medications   Current Outpatient Rx  Name  Route  Sig  Dispense  Refill  . Aspirin-Caffeine 845-65 MG PACK   Oral   Take 1 Package by mouth every 6 (six) hours as needed (pain).         . naproxen sodium (ANAPROX) 220 MG tablet   Oral   Take 220 mg by mouth 2 (two) times daily as needed (pain).          BP 121/60  Pulse 67  Temp(Src) 98.5 F (36.9 C) (Oral)  Resp 20  Ht 5\' 3"  (1.6 m)  Wt 239 lb 2 oz (108.466 kg)  BMI 42.37  kg/m2  SpO2 100% Physical Exam  Nursing note and vitals reviewed. Constitutional: She is oriented to person, place, and time. She appears well-developed and well-nourished. No distress.  HENT:  Head: Normocephalic and atraumatic.  Mouth/Throat: Oropharynx is clear and moist.  Eyes: Pupils are equal, round, and reactive to light.  Neck: Normal range of motion. Neck supple.  Cardiovascular: Normal rate, regular rhythm and normal heart sounds.  Exam reveals no gallop and no friction rub.   No murmur heard. Pulmonary/Chest: Effort normal and breath sounds normal. No respiratory distress.  Abdominal: Soft. Bowel sounds are normal. She exhibits no distension. There is no tenderness. There is no rebound and no guarding.  Musculoskeletal: She exhibits no edema.  Neurological: She is alert and oriented to person, place, and time. She exhibits normal muscle tone. Coordination normal.  Skin: Skin is warm and dry. No rash noted. No erythema.    ED Course  Procedures (including critical care time) Labs Review Labs Reviewed  COMPREHENSIVE METABOLIC PANEL - Abnormal; Notable for the following:    Total Bilirubin 0.2 (*)    All other components within normal limits  URINALYSIS, ROUTINE W REFLEX MICROSCOPIC  CBC WITH DIFFERENTIAL  LIPASE, BLOOD   Patient has a negative CT scan, and  her vital signs of the normal.  Also has normal labs.  Patient will be advised return here as, needed.  She'll be given pain control and antiemetics.  I explained to the patient.  This could be a resolving process and, that she'll need further evaluation.  For any worsening in her condition.  Patient is advised that this could be a gastroenteritis-type scenario.  She is told to slowly increase her fluid intake.  Advised to followup with primary care Dr. or an urgent care  MDM  No diagnosis found. MDM Reviewed: vitals, nursing note and previous chart Interpretation: labs and CT scan    Based on the features of  patient's pain.  I felt that this may represent a kidney stone.  That is why a noncontrast CT was obtained  Carlyle Dolly, PA-C 03/26/13 0141

## 2013-03-26 NOTE — ED Notes (Signed)
Patient transported to CT 

## 2013-06-11 ENCOUNTER — Emergency Department (HOSPITAL_COMMUNITY)
Admission: EM | Admit: 2013-06-11 | Discharge: 2013-06-11 | Disposition: A | Discharge status: Home / Self Care | Payer: PRIVATE HEALTH INSURANCE | Attending: Emergency Medicine | Admitting: Emergency Medicine

## 2013-06-11 ENCOUNTER — Emergency Department (HOSPITAL_COMMUNITY): Payer: Self-pay

## 2013-06-11 ENCOUNTER — Encounter (HOSPITAL_COMMUNITY): Payer: Self-pay | Admitting: Emergency Medicine

## 2013-06-11 DIAGNOSIS — Z8669 Personal history of other diseases of the nervous system and sense organs: Secondary | ICD-10-CM | POA: Insufficient documentation

## 2013-06-11 DIAGNOSIS — J069 Acute upper respiratory infection, unspecified: Secondary | ICD-10-CM | POA: Insufficient documentation

## 2013-06-11 DIAGNOSIS — J45909 Unspecified asthma, uncomplicated: Secondary | ICD-10-CM | POA: Insufficient documentation

## 2013-06-11 DIAGNOSIS — R1110 Vomiting, unspecified: Secondary | ICD-10-CM | POA: Insufficient documentation

## 2013-06-11 DIAGNOSIS — R42 Dizziness and giddiness: Secondary | ICD-10-CM | POA: Insufficient documentation

## 2013-06-11 MED ORDER — GUAIFENESIN-CODEINE 100-10 MG/5ML PO SOLN: 5.0000 mL | Freq: Three times a day (TID) | ORAL | Status: DC | PRN

## 2013-06-11 MED ORDER — AZITHROMYCIN 250 MG PO TABS: 250.0000 mg | ORAL_TABLET | Freq: Every day | ORAL | Status: DC

## 2013-06-11 NOTE — ED Notes (Signed)
Pt states she has had a cough x [redacted] week along with greenish sputum. States last week she had body aches but that has went away. States her chest hurts when she coughs up sputum.

## 2013-06-11 NOTE — ED Provider Notes (Signed)
CSN: [id number]     Arrival date & time 06/11/13  1114 History  This chart was scribed for Dawn Pel, PA working with Laray Anger, DO by Quintella Reichert, ED Scribe. This patient was seen in room WTR9/WTR9 and the patient's care was started at 12:35 PM.   Chief Complaint  Patient presents with  . Cough  . Sore Throat    The history is provided by the patient. No language interpreter was used.    HPI Comments: [name] is a 37 y.o. female with h/o asthma who presents to the Emergency Department complaining of 1 1/2 weeks of persistent worsening cough productive of greenish sputum.  Pt states she has cough attacks that keep her up at night and she has some associated post-tussive emesis.  Pt also complains of sore throat which she attributes to her cough and chest pain brought on only by coughing.  She states she feels slightly lightheaded when coughing.  She denies fevers.  She has attempted to treat symptoms with Theraflu, Nyquil, Tylenol Cold & Flu, without relief.  Pt has not been on antibiotics recently.   Past Medical History  Diagnosis Date  . Asthma   . Seizures     Past Surgical History  Procedure Laterality Date  . Cesarean section    . Abdominal hysterectomy    . Tympanostomy tube placement      Family History  Problem Relation Age of Onset  . Hypertension Mother   . Cancer Other     History  Substance Use Topics  . Smoking status: Never Smoker   . Smokeless tobacco: Not on file  . Alcohol Use: No    OB History   Grav Para Term Preterm Abortions TAB SAB Ect Mult Living                  Review of Systems  Constitutional: Negative for fever.  HENT: Positive for sore throat.   Respiratory: Positive for cough.   Cardiovascular: Positive for chest pain (when coughinig).  Gastrointestinal: Positive for vomiting (post-tussive).  Neurological: Positive for light-headedness.  All other systems reviewed and are negative.     Allergies   Review of patient's allergies indicates no known allergies.  Home Medications   Current Outpatient Rx  Name  Route  Sig  Dispense  Refill  . Aspirin-Caffeine 845-65 MG PACK   Oral   Take 1 Package by mouth every 6 (six) hours as needed (pain).         . Chlorphen-Pseudoephed-APAP (THERAFLU FLU/COLD PO)   Oral   Take 1 Package by mouth daily as needed (cold).         Marland Kitchen DM-Phenylephrine-Acetaminophen (TYLENOL COLD MULTI-SYMPTOM DAY PO)   Oral   Take 30 mLs by mouth daily as needed (cold).         Marland Kitchen azithromycin (ZITHROMAX) 250 MG tablet   Oral   Take 1 tablet (250 mg total) by mouth daily. Take first 2 tablets together, then 1 every day until finished.   6 tablet   0   . guaiFENesin-codeine 100-10 MG/5ML syrup   Oral   Take 5-10 mLs by mouth 3 (three) times daily as needed for cough.   120 mL   0    BP 113/64  Pulse 78  Temp(Src) 98.5 F (36.9 C) (Oral)  Resp 20  SpO2 100%  Physical Exam  Nursing note and vitals reviewed. Constitutional: She is oriented to person, place, and time. She appears well-developed  and well-nourished. No distress.  HENT:  Head: Normocephalic and atraumatic.  Right Ear: Tympanic membrane normal.  Left Ear: Tympanic membrane normal.  Nasal congestion  Eyes: EOM are normal.  Neck: Neck supple. No tracheal deviation present.  Cardiovascular: Normal rate, regular rhythm and normal heart sounds.   No murmur heard. Pulmonary/Chest: Effort normal and breath sounds normal. No respiratory distress. She has no wheezes. She has no rales.  Musculoskeletal: Normal range of motion.  Neurological: She is alert and oriented to person, place, and time.  Skin: Skin is warm and dry.  Psychiatric: She has a normal mood and affect. Her behavior is normal.    ED Course  Procedures (including critical care time)  DIAGNOSTIC STUDIES: Oxygen Saturation is 100% on room air, normal by my interpretation.    COORDINATION OF CARE: 12:38 PM-Informed pt  that symptoms are likely due to bronchitis.  Discussed treatment plan which includes antibiotics and cough syrup to use at night with pt at bedside and pt agreed to plan.    Labs Review Labs Reviewed - No data to display   Imaging Review Dg Chest 2 View  06/11/2013   CLINICAL DATA:  Productive cough.  Short of breath.  EXAM: CHEST  2 VIEW  COMPARISON:  10/23/2010  FINDINGS: The heart size and mediastinal contours are within normal limits. Both lungs are clear. The visualized skeletal structures are unremarkable.  IMPRESSION: No active cardiopulmonary disease.   Electronically Signed   By: Amie Portland M.D.   On: 06/11/2013 12:02    EKG Interpretation   None       MDM   1. URI (upper respiratory infection)    37 y.o.[name]'s evaluation in the Emergency Department is complete. It has been determined that no acute conditions requiring further emergency intervention are present at this time. The patient/guardian have been advised of the diagnosis and plan. We have discussed signs and symptoms that warrant return to the ED, such as changes or worsening in symptoms.  Vital signs are stable at discharge. Filed Vitals:   06/11/13 1121  BP: 113/64  Pulse: 78  Temp: 98.5 F (36.9 C)  Resp: 20    Patient/guardian has voiced understanding and agreed to follow-up with the PCP or specialist.  I personally performed the services described in this documentation, which was scribed in my presence. The recorded information has been reviewed and is accurate.    [name], PA-C 06/11/13 1408

## 2013-06-12 NOTE — ED Provider Notes (Signed)
Medical screening examination/treatment/procedure(s) were performed by non-physician practitioner and as supervising physician I was immediately available for consultation/collaboration.  EKG Interpretation   None         Laray Anger, DO 06/12/13 1425

## 2013-06-14 ENCOUNTER — Emergency Department (HOSPITAL_COMMUNITY)
Admission: EM | Admit: 2013-06-14 | Discharge: 2013-06-15 | Disposition: A | Discharge status: Home / Self Care | Payer: PRIVATE HEALTH INSURANCE | Attending: Emergency Medicine | Admitting: Emergency Medicine

## 2013-06-14 ENCOUNTER — Encounter (HOSPITAL_COMMUNITY): Payer: Self-pay | Admitting: Emergency Medicine

## 2013-06-14 DIAGNOSIS — R000 Tachycardia, unspecified: Secondary | ICD-10-CM | POA: Insufficient documentation

## 2013-06-14 DIAGNOSIS — R509 Fever, unspecified: Secondary | ICD-10-CM | POA: Insufficient documentation

## 2013-06-14 DIAGNOSIS — J111 Influenza due to unidentified influenza virus with other respiratory manifestations: Secondary | ICD-10-CM | POA: Insufficient documentation

## 2013-06-14 DIAGNOSIS — G40909 Epilepsy, unspecified, not intractable, without status epilepticus: Secondary | ICD-10-CM | POA: Insufficient documentation

## 2013-06-14 DIAGNOSIS — R112 Nausea with vomiting, unspecified: Secondary | ICD-10-CM | POA: Insufficient documentation

## 2013-06-14 DIAGNOSIS — E669 Obesity, unspecified: Secondary | ICD-10-CM | POA: Insufficient documentation

## 2013-06-14 DIAGNOSIS — J45909 Unspecified asthma, uncomplicated: Secondary | ICD-10-CM | POA: Insufficient documentation

## 2013-06-14 MED ORDER — PROMETHAZINE HCL 25 MG/ML IJ SOLN
25.0000 mg | Freq: Once | INTRAMUSCULAR | Status: AC
Start: 2013-06-14 — End: 2013-06-15
  Administered 2013-06-15: 25 mg via INTRAVENOUS
  Filled 2013-06-14: qty 1

## 2013-06-14 MED ORDER — ACETAMINOPHEN 325 MG PO TABS
650.0000 mg | ORAL_TABLET | Freq: Once | ORAL | Status: AC
  Administered 2013-06-15: 650 mg via ORAL
  Filled 2013-06-14: qty 2

## 2013-06-14 MED ORDER — SODIUM CHLORIDE 0.9 % IV BOLUS (SEPSIS)
1000.0000 mL | Freq: Once | INTRAVENOUS | Status: AC
  Administered 2013-06-15: 1000 mL via INTRAVENOUS

## 2013-06-14 NOTE — ED Notes (Signed)
Pt presents with c/o cough, vomiting, chills, and seizures. Pt was seen for some of the same symptoms three days ago. Kids in the family are all sick with flu-like symptoms.

## 2013-06-14 NOTE — ED Provider Notes (Addendum)
CSN: [id number]     Arrival date & time 06/14/13  2203 History   First MD Initiated Contact with Patient 06/14/13 2301     Chief Complaint  Patient presents with  . Emesis  . Cough  . Seizures   (Consider location/radiation/quality/duration/timing/severity/associated sxs/prior Treatment) HPI 37 year old female presents to emergency room from home with complaints of her cystic cough, chills, fever, nausea and vomiting.  She reports seizure times one today.  Patient has history of seizure disorder.  She has not had a seizure in 3 years.  She took herself off Dilantin as she did not like the way it made her feel.  Patient was seen in the emergency room 3 days ago and diagnosed with bronchitis.  She was prescribed Robitussin with codeine, and Zithromax.  Patient reports no improvement with the symptoms.  She reports the numbers have influenza.  She did not receive an influenza shot this year.  Past Medical History  Diagnosis Date  . Asthma   . Seizures    Past Surgical History  Procedure Laterality Date  . Cesarean section    . Abdominal hysterectomy    . Tympanostomy tube placement     Family History  Problem Relation Age of Onset  . Hypertension Mother   . Cancer Other    History  Substance Use Topics  . Smoking status: Never Smoker   . Smokeless tobacco: Not on file  . Alcohol Use: No   OB History   Grav Para Term Preterm Abortions TAB SAB Ect Mult Living                 Review of Systems  See History of Present Illness; otherwise all other systems are reviewed and negative Allergies  Review of patient's allergies indicates no known allergies.  Home Medications   Current Outpatient Rx  Name  Route  Sig  Dispense  Refill  . Aspirin-Caffeine 845-65 MG PACK   Oral   Take 1 Package by mouth every 6 (six) hours as needed (pain/headache).          Marland Kitchen azithromycin (ZITHROMAX) 250 MG tablet   Oral   Take 1 tablet (250 mg total) by mouth daily. Take first 2 tablets  together, then 1 every day until finished.   6 tablet   0   . Chlorphen-Pseudoephed-APAP (THERAFLU FLU/COLD PO)   Oral   Take 1 Package by mouth 2 (two) times daily as needed (cold symptoms).          Marland Kitchen DM-Phenylephrine-Acetaminophen (TYLENOL COLD MULTI-SYMPTOM DAY PO)   Oral   Take 30 mLs by mouth 3 (three) times daily as needed (cold symptoms).          Marland Kitchen guaiFENesin-codeine 100-10 MG/5ML syrup   Oral   Take 5-10 mLs by mouth 3 (three) times daily as needed for cough.   120 mL   0    BP 133/73  Pulse 118  Temp(Src) 102.2 F (39 C) (Oral)  Resp 24  SpO2 95% Physical Exam  Nursing note and vitals reviewed. Constitutional: She is oriented to person, place, and time. She appears well-developed and well-nourished. She appears distressed.  Obese, uncomfortable appearing  HENT:  Head: Normocephalic and atraumatic.  Right Ear: External ear normal.  Left Ear: External ear normal.  Nose: Nose normal.  Mouth/Throat: Oropharynx is clear and moist.  Eyes: Conjunctivae and EOM are normal. Pupils are equal, round, and reactive to light.  Neck: Normal range of motion. Neck supple. No JVD present.  No tracheal deviation present. No thyromegaly present.  Cardiovascular: Regular rhythm, normal heart sounds and intact distal pulses.  Exam reveals no gallop and no friction rub.   No murmur heard. Tachycardia  Pulmonary/Chest: Effort normal and breath sounds normal. No stridor. No respiratory distress. She has no wheezes. She has no rales. She exhibits no tenderness.  Cough  Abdominal: Soft. Bowel sounds are normal. She exhibits no distension and no mass. There is no tenderness. There is no rebound and no guarding.  Musculoskeletal: Normal range of motion. She exhibits no edema and no tenderness.  Lymphadenopathy:    She has no cervical adenopathy.  Neurological: She is alert and oriented to person, place, and time. She exhibits normal muscle tone. Coordination normal.  Skin: Skin is  warm and dry. No rash noted. No erythema. No pallor.  Psychiatric: She has a normal mood and affect. Her behavior is normal. Judgment and thought content normal.    ED Course  Procedures (including critical care time) Labs Review Labs Reviewed  RESPIRATORY VIRUS PANEL   Imaging Review No results found.  EKG Interpretation   None       MDM   1. Influenza-like illness    37 year old female with fever, vomiting, body aches, and cough.  Most likely a flulike illness, possible influenza A.  Will get respiratory virus panel.  Supportive care.    Olivia Mackie, MD 06/15/13 1610  Olivia Mackie, MD 06/15/13 [phone]

## 2013-06-15 MED ORDER — IBUPROFEN 800 MG PO TABS
800.0000 mg | ORAL_TABLET | Freq: Once | ORAL | Status: AC
  Administered 2013-06-15: 800 mg via ORAL
  Filled 2013-06-15: qty 1

## 2013-06-15 MED ORDER — PROMETHAZINE-DM 6.25-15 MG/5ML PO SYRP: 5.0000 mL | ORAL_SOLUTION | Freq: Four times a day (QID) | ORAL | Status: DC | PRN

## 2013-06-15 MED ORDER — ONDANSETRON 8 MG PO TBDP: 8.0000 mg | ORAL_TABLET | Freq: Three times a day (TID) | ORAL | Status: DC | PRN

## 2013-06-16 LAB — RESPIRATORY VIRUS PANEL
Influenza A H3: DETECTED — AB
Influenza A: DETECTED — AB
Influenza B: NOT DETECTED
Parainfluenza 1: NOT DETECTED
Parainfluenza 2: NOT DETECTED
Parainfluenza 3: NOT DETECTED
Respiratory Syncytial Virus B: NOT DETECTED
Rhinovirus: NOT DETECTED

## 2013-08-04 ENCOUNTER — Emergency Department (INDEPENDENT_AMBULATORY_CARE_PROVIDER_SITE_OTHER): Payer: Self-pay

## 2013-08-04 ENCOUNTER — Encounter (HOSPITAL_COMMUNITY): Payer: Self-pay | Admitting: Emergency Medicine

## 2013-08-04 ENCOUNTER — Emergency Department (INDEPENDENT_AMBULATORY_CARE_PROVIDER_SITE_OTHER)
Admission: EM | Admit: 2013-08-04 | Discharge: 2013-08-04 | Disposition: A | Discharge status: Home / Self Care | Payer: Self-pay | Source: Home / Self Care | Attending: Family Medicine | Admitting: Family Medicine

## 2013-08-04 DIAGNOSIS — S93409A Sprain of unspecified ligament of unspecified ankle, initial encounter: Secondary | ICD-10-CM

## 2013-08-04 DIAGNOSIS — M79609 Pain in unspecified limb: Secondary | ICD-10-CM

## 2013-08-04 DIAGNOSIS — M79673 Pain in unspecified foot: Secondary | ICD-10-CM

## 2013-08-04 MED ORDER — HYDROCODONE-ACETAMINOPHEN 5-325 MG PO TABS: 1.0000 | ORAL_TABLET | Freq: Four times a day (QID) | ORAL | Status: DC | PRN

## 2013-08-04 MED ORDER — HYDROCODONE-ACETAMINOPHEN 5-325 MG PO TABS
2.0000 | ORAL_TABLET | Freq: Once | ORAL | Status: AC
  Administered 2013-08-04: 2 via ORAL

## 2013-08-04 MED ORDER — HYDROCODONE-ACETAMINOPHEN 5-325 MG PO TABS
ORAL_TABLET | ORAL | Status: AC
  Filled 2013-08-04: qty 2

## 2013-08-04 NOTE — ED Provider Notes (Signed)
CSN: [id number]     Arrival date & time 08/04/13  0867 History   First MD Initiated Contact with Patient 08/04/13 [phone]     Chief Complaint  Patient presents with  . Foot Injury     (Consider location/radiation/quality/duration/timing/severity/associated sxs/prior Treatment) HPI Comments: 38 year old female presents complaining of right foot injury from falling down stairs sustained about 45 minutes prior to arrival. She has pain and swelling in the top of her foot and has not been able to bear weight on the foot since this happened. She rates her pain level is 8/10 this time. She can move her toes. She denies any numbness in the foot. She has no other injuries. She has not taken anything for pain  Patient is a 38 y.o. female presenting with foot injury.  Foot Injury Associated symptoms: no fever     Past Medical History  Diagnosis Date  . Asthma   . Seizures    Past Surgical History  Procedure Laterality Date  . Cesarean section    . Abdominal hysterectomy    . Tympanostomy tube placement     Family History  Problem Relation Age of Onset  . Hypertension Mother   . Cancer Other    History  Substance Use Topics  . Smoking status: Never Smoker   . Smokeless tobacco: Not on file  . Alcohol Use: No   OB History   Grav Para Term Preterm Abortions TAB SAB Ect Mult Living                 Review of Systems  Constitutional: Negative for fever and chills.  Eyes: Negative for visual disturbance.  Respiratory: Negative for cough and shortness of breath.   Cardiovascular: Negative for chest pain, palpitations and leg swelling.  Gastrointestinal: Negative for nausea, vomiting and abdominal pain.  Endocrine: Negative for polydipsia and polyuria.  Genitourinary: Negative for dysuria, urgency and frequency.  Musculoskeletal:       See history of present illness  Skin: Negative for rash.  Neurological: Negative for dizziness, weakness and light-headedness.      Allergies   Review of patient's allergies indicates no known allergies.  Home Medications   Current Outpatient Rx  Name  Route  Sig  Dispense  Refill  . Aspirin-Caffeine 845-65 MG PACK   Oral   Take 1 Package by mouth every 6 (six) hours as needed (pain/headache).          Marland Kitchen azithromycin (ZITHROMAX) 250 MG tablet   Oral   Take 1 tablet (250 mg total) by mouth daily. Take first 2 tablets together, then 1 every day until finished.   6 tablet   0   . Chlorphen-Pseudoephed-APAP (THERAFLU FLU/COLD PO)   Oral   Take 1 Package by mouth 2 (two) times daily as needed (cold symptoms).          Marland Kitchen DM-Phenylephrine-Acetaminophen (TYLENOL COLD MULTI-SYMPTOM DAY PO)   Oral   Take 30 mLs by mouth 3 (three) times daily as needed (cold symptoms).          Marland Kitchen HYDROcodone-acetaminophen (NORCO) 5-325 MG per tablet   Oral   Take 1 tablet by mouth every 6 (six) hours as needed for moderate pain.   15 tablet   0   . ondansetron (ZOFRAN ODT) 8 MG disintegrating tablet   Oral   Take 1 tablet (8 mg total) by mouth every 8 (eight) hours as needed for nausea or vomiting.   20 tablet   0   .  promethazine-dextromethorphan (PROMETHAZINE-DM) 6.25-15 MG/5ML syrup   Oral   Take 5 mLs by mouth 4 (four) times daily as needed for cough.   118 mL   0    BP 93/74  Pulse 74  Temp(Src) 97.6 F (36.4 C) (Oral)  Resp 16  SpO2 99% Physical Exam  Nursing note and vitals reviewed. Constitutional: She is oriented to person, place, and time. Vital signs are normal. She appears well-developed and well-nourished. No distress.  HENT:  Head: Normocephalic and atraumatic.  Pulmonary/Chest: Effort normal. No respiratory distress.  Musculoskeletal:       Right ankle: She exhibits decreased range of motion and ecchymosis. She exhibits no swelling and no deformity. Tenderness (diffuse). Lateral malleolus, medial malleolus, AITFL, CF ligament, posterior TFL and head of 5th metatarsal tenderness found. No proximal fibula  tenderness found. Achilles tendon normal.       Right foot: She exhibits tenderness, bony tenderness and swelling (over metatarsal). She exhibits normal capillary refill.  Neurological: She is alert and oriented to person, place, and time. She has normal strength. Coordination normal.  Skin: Skin is warm and dry. No rash noted. She is not diaphoretic.  Psychiatric: She has a normal mood and affect. Judgment normal.    ED Course  Procedures (including critical care time) Labs Review Labs Reviewed - No data to display Imaging Review Dg Ankle Complete Right  08/04/2013   CLINICAL DATA:  Right ankle pain post twisted ankle  EXAM: RIGHT ANKLE - COMPLETE 3+ VIEW  COMPARISON:  03/28/2010  FINDINGS: Three views of right ankle submitted. No acute fracture or subluxation. Ankle mortise is preserved. There is plantar spurring of calcaneus.  IMPRESSION: No acute fracture or subluxation.  Plantar spur of calcaneus.   Electronically Signed   By: Lahoma Crocker M.D.   On: 08/04/2013 10:18   Dg Foot Complete Right  08/04/2013   CLINICAL DATA:  Right foot pain  EXAM: RIGHT FOOT COMPLETE - 3+ VIEW  COMPARISON:  03/28/2010  FINDINGS: Three views of the right foot submitted. No acute fracture or subluxation. Mild dorsal spur tarsal region. Plantar spur of calcaneus.  IMPRESSION: No acute fracture or subluxation. Plantar spur of calcaneus. Mild dorsal spurring tarsal region.   Electronically Signed   By: Lahoma Crocker M.D.   On: 08/04/2013 10:36      MDM  The diagnosis is ankle sprain and foot pain  Treating with walking boot, to over-the-counter Aleve twice daily, Norco when necessary for pain. Given a work note to be out for 2 days and to limit walking for the following 3 days. She should followup if still having pain at that time that limits her activities.   Meds ordered this encounter  Medications  . HYDROcodone-acetaminophen (NORCO/VICODIN) 5-325 MG per tablet 2 tablet    Sig:   . HYDROcodone-acetaminophen  (NORCO) 5-325 MG per tablet    Sig: Take 1 tablet by mouth every 6 (six) hours as needed for moderate pain.    Dispense:  15 tablet    Refill:  0    Order Specific Question:  Supervising Provider    Answer:  Lynne Leader, [place]       Liam Graham, PA-C 08/04/13 1054

## 2013-08-04 NOTE — ED Notes (Addendum)
C/o right foot injury due to falling down stairs this morning   No distress but states she is not able to walk on foot.

## 2013-08-04 NOTE — Discharge Instructions (Signed)
Acute Ankle Sprain  with Phase I Rehab  An acute ankle sprain is a partial or complete tear in one or more of the ligaments of the ankle due to traumatic injury. The severity of the injury depends on both the the number of ligaments sprained and the grade of sprain. There are 3 grades of sprains.   · A grade 1 sprain is a mild sprain. There is a slight pull without obvious tearing. There is no loss of strength, and the muscle and ligament are the correct length.  · A grade 2 sprain is a moderate sprain. There is tearing of fibers within the substance of the ligament where it connects two bones or two cartilages. The length of the ligament is increased, and there is usually decreased strength.  · A grade 3 sprain is a complete rupture of the ligament and is uncommon.  In addition to the grade of sprain, there are three types of ankle sprains.   Lateral ankle sprains: This is a sprain of one or more of the three ligaments on the outer side (lateral) of the ankle. These are the most common sprains.  Medial ankle sprains: There is one large triangular ligament of the inner side (medial) of the ankle that is susceptible to injury. Medial ankle sprains are less common.  Syndesmosis, "high ankle," sprains: The syndesmosis is the ligament that connects the two bones of the lower leg. Syndesmosis sprains usually only occur with very severe ankle sprains.  SYMPTOMS  · Pain, tenderness, and swelling in the ankle, starting at the side of injury that may progress to the whole ankle and foot with time.  · "Pop" or tearing sensation at the time of injury.  · Bruising that may spread to the heel.  · Impaired ability to walk soon after injury.  CAUSES   · Acute ankle sprains are caused by trauma placed on the ankle that temporarily forces or pries the anklebone (talus) out of its normal socket.  · Stretching or tearing of the ligaments that normally hold the joint in place (usually due to a twisting injury).  RISK INCREASES  WITH:  · Previous ankle sprain.  · Sports in which the foot may land awkwardly (ie. basketball, volleyball, or soccer) or walking or running on uneven or rough surfaces.  · Shoes with inadequate support to prevent sideways motion when stress occurs.  · Poor strength and flexibility.  · Poor balance skills.  · Contact sports.  PREVENTION   · Warm up and stretch properly before activity.  · Maintain physical fitness:  · Ankle and leg flexibility, muscle strength, and endurance.  · Cardiovascular fitness.  · Balance training activities.  · Use proper technique and have a coach correct improper technique.  · Taping, protective strapping, bracing, or high-top tennis shoes may help prevent injury. Initially, tape is best; however, it loses most of its support function within 10 to 15 minutes.  · Wear proper fitted protective shoes (High-top shoes with taping or bracing is more effective than either alone).  · Provide the ankle with support during sports and practice activities for 12 months following injury.  PROGNOSIS   · If treated properly, ankle sprains can be expected to recover completely; however, the length of recovery depends on the degree of injury.  · A grade 1 sprain usually heals enough in 5 to 7 days to allow modified activity and requires an average of 6 weeks to heal completely.  · A grade 2 sprain requires   6 to 10 weeks to heal completely.  · A grade 3 sprain requires 12 to 16 weeks to heal.  · A syndesmosis sprain often takes more than 3 months to heal.  RELATED COMPLICATIONS   · Frequent recurrence of symptoms may result in a chronic problem. Appropriately addressing the problem the first time decreases the frequency of recurrence and optimizes healing time. Severity of the initial sprain does not predict the likelihood of later instability.  · Injury to other structures (bone, cartilage, or tendon).  · A chronically unstable or arthritic ankle joint is a possiblity with repeated  sprains.  TREATMENT  Treatment initially involves the use of ice, medication, and compression bandages to help reduce pain and inflammation. Ankle sprains are usually immobilized in a walking cast or boot to allow for healing. Crutches may be recommended to reduce pressure on the injury. After immobilization, strengthening and stretching exercises may be necessary to regain strength and a full range of motion. Surgery is rarely needed to treat ankle sprains.  MEDICATION   · Nonsteroidal anti-inflammatory medications, such as aspirin and ibuprofen (do not take for the first 3 days after injury or within 7 days before surgery), or other minor pain relievers, such as acetaminophen, are often recommended. Take these as directed by your caregiver. Contact your caregiver immediately if any bleeding, stomach upset, or signs of an allergic reaction occur from these medications.  · Ointments applied to the skin may be helpful.  · Pain relievers may be prescribed as necessary by your caregiver. Do not take prescription pain medication for longer than 4 to 7 days. Use only as directed and only as much as you need.  HEAT AND COLD  · Cold treatment (icing) is used to relieve pain and reduce inflammation for acute and chronic cases. Cold should be applied for 10 to 15 minutes every 2 to 3 hours for inflammation and pain and immediately after any activity that aggravates your symptoms. Use ice packs or an ice massage.  · Heat treatment may be used before performing stretching and strengthening activities prescribed by your caregiver. Use a heat pack or a warm soak.  SEEK IMMEDIATE MEDICAL CARE IF:   · Pain, swelling, or bruising worsens despite treatment.  · You experience pain, numbness, discoloration, or coldness in the foot or toes.  · New, unexplained symptoms develop (drugs used in treatment may produce side effects.)  EXERCISES   PHASE I EXERCISES  RANGE OF MOTION (ROM) AND STRETCHING EXERCISES - Ankle Sprain, Acute Phase I,  Weeks 1 to 2  These exercises may help you when beginning to restore flexibility in your ankle. You will likely work on these exercises for the 1 to 2 weeks after your injury. Once your physician, physical therapist, or athletic trainer sees adequate progress, he or she will advance your exercises. While completing these exercises, remember:   · Restoring tissue flexibility helps normal motion to return to the joints. This allows healthier, less painful movement and activity.  · An effective stretch should be held for at least 30 seconds.  · A stretch should never be painful. You should only feel a gentle lengthening or release in the stretched tissue.  RANGE OF MOTION - Dorsi/Plantar Flexion  · While sitting with your right / left knee straight, draw the top of your foot upwards by flexing your ankle. Then reverse the motion, pointing your toes downward.  · Hold each position for __________ seconds.  · After completing your first set of   exercises, repeat this exercise with your knee bent.  Repeat __________ times. Complete this exercise __________ times per day.   RANGE OF MOTION - Ankle Alphabet  · Imagine your right / left big toe is a pen.  · Keeping your hip and knee still, write out the entire alphabet with your "pen." Make the letters as large as you can without increasing any discomfort.  Repeat __________ times. Complete this exercise __________ times per day.   STRENGTHENING EXERCISES - Ankle Sprain, Acute -Phase I, Weeks 1 to 2  These exercises may help you when beginning to restore strength in your ankle. You will likely work on these exercises for 1 to 2 weeks after your injury. Once your physician, physical therapist, or athletic trainer sees adequate progress, he or she will advance your exercises. While completing these exercises, remember:   · Muscles can gain both the endurance and the strength needed for everyday activities through controlled exercises.  · Complete these exercises as instructed by  your physician, physical therapist, or athletic trainer. Progress the resistance and repetitions only as guided.  · You may experience muscle soreness or fatigue, but the pain or discomfort you are trying to eliminate should never worsen during these exercises. If this pain does worsen, stop and make certain you are following the directions exactly. If the pain is still present after adjustments, discontinue the exercise until you can discuss the trouble with your clinician.  STRENGTH - Dorsiflexors  · Secure a rubber exercise band/tubing to a fixed object (ie. table, pole) and loop the other end around your right / left foot.  · Sit on the floor facing the fixed object. The band/tubing should be slightly tense when your foot is relaxed.  · Slowly draw your foot back toward you using your ankle and toes.  · Hold this position for __________ seconds. Slowly release the tension in the band and return your foot to the starting position.  Repeat __________ times. Complete this exercise __________ times per day.   STRENGTH - Plantar-flexors   · Sit with your right / left leg extended. Holding onto both ends of a rubber exercise band/tubing, loop it around the ball of your foot. Keep a slight tension in the band.  · Slowly push your toes away from you, pointing them downward.  · Hold this position for __________ seconds. Return slowly, controlling the tension in the band/tubing.  Repeat __________ times. Complete this exercise __________ times per day.   STRENGTH - Ankle Eversion  · Secure one end of a rubber exercise band/tubing to a fixed object (table, pole). Loop the other end around your foot just before your toes.  · Place your fists between your knees. This will focus your strengthening at your ankle.  · Drawing the band/tubing across your opposite foot, slowly, pull your little toe out and up. Make sure the band/tubing is positioned to resist the entire motion.  · Hold this position for __________ seconds.  Have  your muscles resist the band/tubing as it slowly pulls your foot back to the starting position.   Repeat __________ times. Complete this exercise __________ times per day.   STRENGTH - Ankle Inversion  · Secure one end of a rubber exercise band/tubing to a fixed object (table, pole). Loop the other end around your foot just before your toes.  · Place your fists between your knees. This will focus your strengthening at your ankle.  · Slowly, pull your big toe up and in, making   sure the band/tubing is positioned to resist the entire motion.  · Hold this position for __________ seconds.  · Have your muscles resist the band/tubing as it slowly pulls your foot back to the starting position.  Repeat __________ times. Complete this exercises __________ times per day.   STRENGTH - Towel Curls  · Sit in a chair positioned on a non-carpeted surface.  · Place your right / left foot on a towel, keeping your heel on the floor.  · Pull the towel toward your heel by only curling your toes. Keep your heel on the floor.  · If instructed by your physician, physical therapist, or athletic trainer, add weight to the end of the towel.  Repeat __________ times. Complete this exercise __________ times per day.  Document Released: 01/11/2005 Document Revised: 09/04/2011 Document Reviewed: 09/24/2008  [employer]® Patient Information ©2014 [employer].

## 2013-08-06 NOTE — ED Provider Notes (Signed)
Medical screening examination/treatment/procedure(s) were performed by a resident physician or non-physician practitioner and as the supervising physician I was immediately available for consultation/collaboration.  Maeryn Mcgath, MD    Jasmeen Fritsch S Babe Clenney, MD 08/06/13 0748 

## 2013-09-11 ENCOUNTER — Encounter (HOSPITAL_COMMUNITY): Payer: Self-pay | Admitting: Emergency Medicine

## 2013-09-11 ENCOUNTER — Emergency Department (HOSPITAL_COMMUNITY)
Admission: EM | Admit: 2013-09-11 | Discharge: 2013-09-11 | Disposition: A | Discharge status: Home / Self Care | Payer: PRIVATE HEALTH INSURANCE | Attending: Emergency Medicine | Admitting: Emergency Medicine

## 2013-09-11 DIAGNOSIS — Z8669 Personal history of other diseases of the nervous system and sense organs: Secondary | ICD-10-CM | POA: Insufficient documentation

## 2013-09-11 DIAGNOSIS — J45909 Unspecified asthma, uncomplicated: Secondary | ICD-10-CM | POA: Insufficient documentation

## 2013-09-11 DIAGNOSIS — L02619 Cutaneous abscess of unspecified foot: Secondary | ICD-10-CM | POA: Insufficient documentation

## 2013-09-11 DIAGNOSIS — Z792 Long term (current) use of antibiotics: Secondary | ICD-10-CM | POA: Insufficient documentation

## 2013-09-11 DIAGNOSIS — L0390 Cellulitis, unspecified: Secondary | ICD-10-CM

## 2013-09-11 DIAGNOSIS — L03119 Cellulitis of unspecified part of limb: Principal | ICD-10-CM

## 2013-09-11 MED ORDER — NAPROXEN 500 MG PO TABS: 500.0000 mg | ORAL_TABLET | Freq: Two times a day (BID) | ORAL | Status: DC

## 2013-09-11 MED ORDER — SULFAMETHOXAZOLE-TRIMETHOPRIM 800-160 MG PO TABS: 1.0000 | ORAL_TABLET | Freq: Two times a day (BID) | ORAL | Status: DC

## 2013-09-11 MED ORDER — TRAMADOL HCL 50 MG PO TABS: 50.0000 mg | ORAL_TABLET | Freq: Four times a day (QID) | ORAL | Status: DC | PRN

## 2013-09-11 NOTE — Discharge Instructions (Signed)
Please call your doctor for a followup appointment within 24-48 hours. When you talk to your doctor please let them know that you were seen in the emergency department and have them acquire all of your records so that they can discuss the findings with you and formulate a treatment plan to fully care for your new and ongoing problems. ° °

## 2013-09-11 NOTE — ED Notes (Signed)
Pt states she got a tattoo on her left foot on Friday and she states Sunday she started having swelling that has progressively gotten worse since

## 2013-09-11 NOTE — ED Provider Notes (Signed)
CSN: [id number]     Arrival date & time 09/11/13  0024 History   First MD Initiated Contact with Patient 09/11/13 0121     Chief Complaint  Patient presents with  . Foot Swelling     (Consider location/radiation/quality/duration/timing/severity/associated sxs/prior Treatment) HPI Comments: 38 year old female with no relevant past medical history presents with a complaint of left foot swelling. She states that this started several days ago, this occurred 2 days after obtaining a tattoo on the dorsum of her left foot. The pain is persistent, gradually worsening, now associated with swelling and a small amount of drainage out of several wounds on the top of her foot. She denies fevers chills nausea vomiting and denies any history of diabetes, steroid use, HIV or other immunocompromised condition. The pain is worse with ambulation, she has not had anything except for a and D. ointment as instructed by the tattoo specialist whom she saw. He has not been seen by a physician prior to arrival.  The history is provided by the patient.    Past Medical History  Diagnosis Date  . Asthma   . Seizures    Past Surgical History  Procedure Laterality Date  . Cesarean section    . Abdominal hysterectomy    . Tympanostomy tube placement     Family History  Problem Relation Age of Onset  . Hypertension Mother   . Cancer Other    History  Substance Use Topics  . Smoking status: Never Smoker   . Smokeless tobacco: Not on file  . Alcohol Use: No   OB History   Grav Para Term Preterm Abortions TAB SAB Ect Mult Living                 Review of Systems  All other systems reviewed and are negative.      Allergies  Review of patient's allergies indicates no known allergies.  Home Medications   Current Outpatient Rx  Name  Route  Sig  Dispense  Refill  . AMOXICILLIN ER PO   Oral   Take 1 tablet by mouth 2 (two) times daily.         . naproxen sodium (ANAPROX) 220 MG tablet   Oral  Take 440 mg by mouth 2 (two) times daily as needed (pain).         . naproxen (NAPROSYN) 500 MG tablet   Oral   Take 1 tablet (500 mg total) by mouth 2 (two) times daily with a meal.   30 tablet   0   . sulfamethoxazole-trimethoprim (SEPTRA DS) 800-160 MG per tablet   Oral   Take 1 tablet by mouth every 12 (twelve) hours.   20 tablet   0   . traMADol (ULTRAM) 50 MG tablet   Oral   Take 1 tablet (50 mg total) by mouth every 6 (six) hours as needed.   15 tablet   0    BP 115/68  Pulse 65  Temp(Src) 97.9 F (36.6 C) (Oral)  Resp 14  SpO2 99% Physical Exam  Nursing note and vitals reviewed. Constitutional: She appears well-developed and well-nourished. No distress.  HENT:  Head: Normocephalic and atraumatic.  Mouth/Throat: Oropharynx is clear and moist. No oropharyngeal exudate.  Eyes: Conjunctivae and EOM are normal. Pupils are equal, round, and reactive to light. Right eye exhibits no discharge. Left eye exhibits no discharge. No scleral icterus.  Neck: Normal range of motion. Neck supple. No JVD present. No thyromegaly present.  Cardiovascular: Normal rate,  regular rhythm, normal heart sounds and intact distal pulses.  Exam reveals no gallop and no friction rub.   No murmur heard. Pulmonary/Chest: Effort normal.  Musculoskeletal: Normal range of motion. She exhibits tenderness. She exhibits no edema.  Lymphadenopathy:    She has no cervical adenopathy.  Neurological: She is alert. Coordination normal.  Skin: Skin is warm and dry. No rash noted. There is erythema.  Psychiatric: She has a normal mood and affect. Her behavior is normal.    ED Course  Procedures (including critical care time) Labs Review Labs Reviewed - No data to display Imaging Review No results found.   EKG Interpretation None      MDM   Final diagnoses:  Cellulitis    Redness on the dorsum of the left foot, there is minimal redness, mild swelling, no purulent drainage and no induration  or identified abscess. The patient will be treated with antibiotics, anti-inflammatories and tramadol, discharged home in a stable condition.   Meds given in ED:  Medications - No data to display  New Prescriptions   NAPROXEN (NAPROSYN) 500 MG TABLET    Take 1 tablet (500 mg total) by mouth 2 (two) times daily with a meal.   SULFAMETHOXAZOLE-TRIMETHOPRIM (SEPTRA DS) 800-160 MG PER TABLET    Take 1 tablet by mouth every 12 (twelve) hours.   TRAMADOL (ULTRAM) 50 MG TABLET    Take 1 tablet (50 mg total) by mouth every 6 (six) hours as needed.        Johnna Acosta, MD 09/11/13 [phone]

## 2013-10-28 DIAGNOSIS — J45909 Unspecified asthma, uncomplicated: Secondary | ICD-10-CM | POA: Insufficient documentation

## 2013-10-28 DIAGNOSIS — Z791 Long term (current) use of non-steroidal anti-inflammatories (NSAID): Secondary | ICD-10-CM | POA: Insufficient documentation

## 2013-10-28 DIAGNOSIS — Z8669 Personal history of other diseases of the nervous system and sense organs: Secondary | ICD-10-CM | POA: Insufficient documentation

## 2013-10-28 DIAGNOSIS — R071 Chest pain on breathing: Secondary | ICD-10-CM | POA: Insufficient documentation

## 2013-10-29 ENCOUNTER — Encounter (HOSPITAL_COMMUNITY): Payer: Self-pay | Admitting: Emergency Medicine

## 2013-10-29 ENCOUNTER — Emergency Department (HOSPITAL_COMMUNITY)
Admission: EM | Admit: 2013-10-29 | Discharge: 2013-10-29 | Disposition: A | Discharge status: Home / Self Care | Payer: PRIVATE HEALTH INSURANCE | Attending: Emergency Medicine | Admitting: Emergency Medicine

## 2013-10-29 ENCOUNTER — Emergency Department (HOSPITAL_COMMUNITY): Payer: PRIVATE HEALTH INSURANCE

## 2013-10-29 DIAGNOSIS — R0789 Other chest pain: Secondary | ICD-10-CM

## 2013-10-29 MED ORDER — TRAMADOL HCL 50 MG PO TABS: 50.0000 mg | ORAL_TABLET | Freq: Four times a day (QID) | ORAL | Status: DC | PRN

## 2013-10-29 MED ORDER — TRAMADOL HCL 50 MG PO TABS
50.0000 mg | ORAL_TABLET | Freq: Four times a day (QID) | ORAL | Status: DC | PRN
  Administered 2013-10-29: 50 mg via ORAL
  Filled 2013-10-29: qty 1

## 2013-10-29 MED ORDER — NAPROXEN 500 MG PO TABS
500.0000 mg | ORAL_TABLET | Freq: Two times a day (BID) | ORAL | Status: DC
  Administered 2013-10-29: 500 mg via ORAL
  Filled 2013-10-29: qty 1

## 2013-10-29 MED ORDER — NAPROXEN 500 MG PO TABS: 500.0000 mg | ORAL_TABLET | Freq: Two times a day (BID) | ORAL | Status: DC

## 2013-10-29 NOTE — ED Notes (Signed)
Pt c/o right side rib pain radiating to back rib area and some discomfort to rt arm

## 2013-10-29 NOTE — Discharge Instructions (Signed)
Your workup today has shown chest wall pain.  Your chest xray does not show any signs of lung or bone problems.  Take medications as prescribed.  Return to the ER for worsening condition or new concerning symptoms (rash, fever, pain uncontrolled by the medication).  Rest.   Chest Wall Pain Chest wall pain is pain in or around the bones and muscles of your chest. It may take up to 6 weeks to get better. It may take longer if you must stay physically active in your work and activities.  CAUSES  Chest wall pain may happen on its own. However, it may be caused by:  A viral illness like the flu.  Injury.  Coughing.  Exercise.  Arthritis.  Fibromyalgia.  Shingles. HOME CARE INSTRUCTIONS   Avoid overtiring physical activity. Try not to strain or perform activities that cause pain. This includes any activities using your chest or your abdominal and side muscles, especially if heavy weights are used.  Put ice on the sore area.  Put ice in a plastic bag.  Place a towel between your skin and the bag.  Leave the ice on for 15-20 minutes per hour while awake for the first 2 days.  Only take over-the-counter or prescription medicines for pain, discomfort, or fever as directed by your caregiver. SEEK IMMEDIATE MEDICAL CARE IF:   Your pain increases, or you are very uncomfortable.  You have a fever.  Your chest pain becomes worse.  You have new, unexplained symptoms.  You have nausea or vomiting.  You feel sweaty or lightheaded.  You have a cough with phlegm (sputum), or you cough up blood. MAKE SURE YOU:   Understand these instructions.  Will watch your condition.  Will get help right away if you are not doing well or get worse. Document Released: 06/12/2005 Document Revised: 09/04/2011 Document Reviewed: 02/06/2011 [facility] Patient Information 2014 [place], [place].

## 2013-10-29 NOTE — ED Provider Notes (Signed)
CSN: [id number]     Arrival date & time 10/28/13  2343 History   First MD Initiated Contact with Patient 10/29/13 0057     Chief Complaint  Patient presents with  . Chest Pain     (Consider location/radiation/quality/duration/timing/severity/associated sxs/prior Treatment) HPI 38 yo female presents to the ER from home with complaint of right sided chest pain.  Pain started this morning upon waking.  No trauma, no new activities.  Pain wraps around chest.  Some radiation into armpit.  No sob.  Pt denies smoking.  She has had mild cough.  No fever, chills, n/v.  No significant past medical history aside from seizures in pregnancy.  No family history.   Past Medical History  Diagnosis Date  . Asthma   . Seizures    Past Surgical History  Procedure Laterality Date  . Cesarean section    . Abdominal hysterectomy    . Tympanostomy tube placement     Family History  Problem Relation Age of Onset  . Hypertension Mother   . Cancer Other    History  Substance Use Topics  . Smoking status: Never Smoker   . Smokeless tobacco: Not on file  . Alcohol Use: No   OB History   Grav Para Term Preterm Abortions TAB SAB Ect Mult Living                 Review of Systems  All other systems reviewed and are negative.     Allergies  Review of patient's allergies indicates no known allergies.  Home Medications   Prior to Admission medications   Medication Sig Start Date End Date Taking? Authorizing Provider  Aspirin-Acetaminophen (GOODYS BODY PAIN PO) Take 1 Package by mouth 2 (two) times daily as needed (pain).   Yes Historical Provider, MD  cetirizine (ZYRTEC) 10 MG tablet Take 10 mg by mouth daily as needed for allergies.   Yes Historical Provider, MD  naproxen sodium (ANAPROX) 220 MG tablet Take 220 mg by mouth 2 (two) times daily as needed (pain).   Yes Historical Provider, MD  naproxen (NAPROSYN) 500 MG tablet Take 1 tablet (500 mg total) by mouth 2 (two) times daily with a meal.  10/29/13   Kalman Drape, MD  traMADol (ULTRAM) 50 MG tablet Take 1 tablet (50 mg total) by mouth every 6 (six) hours as needed for moderate pain or severe pain. 10/29/13   Kalman Drape, MD   BP 109/67  Pulse 61  Temp(Src) 97.9 F (36.6 C) (Oral)  Resp 18  SpO2 98% Physical Exam  Nursing note and vitals reviewed. Constitutional: She is oriented to person, place, and time. She appears well-developed and well-nourished. She appears distressed.  HENT:  Head: Normocephalic and atraumatic.  Nose: Nose normal.  Mouth/Throat: Oropharynx is clear and moist.  Eyes: Conjunctivae and EOM are normal. Pupils are equal, round, and reactive to light.  Neck: Normal range of motion. Neck supple. No JVD present. No tracheal deviation present. No thyromegaly present.  Cardiovascular: Normal rate, regular rhythm, normal heart sounds and intact distal pulses.  Exam reveals no gallop and no friction rub.   No murmur heard. Pulmonary/Chest: Effort normal and breath sounds normal. No stridor. No respiratory distress. She has no wheezes. She has no rales. She exhibits tenderness (ttp mid axillary line at rib 5-6 around to back.  No overlying skin changes, no rash, no crepitus).  Abdominal: Soft. Bowel sounds are normal. She exhibits no distension and no mass. There is  no tenderness. There is no rebound and no guarding.  Musculoskeletal: Normal range of motion. She exhibits no edema and no tenderness.  Lymphadenopathy:    She has no cervical adenopathy.  Neurological: She is alert and oriented to person, place, and time. She exhibits normal muscle tone. Coordination normal.  Skin: Skin is warm and dry. No rash noted. No erythema. No pallor.  Psychiatric: She has a normal mood and affect. Her behavior is normal. Judgment and thought content normal.    ED Course  Procedures (including critical care time) Labs Review Labs Reviewed - No data to display  Imaging Review Dg Chest 2 View  10/29/2013   CLINICAL DATA:   Sudden onset mid chest pain, shortness of breath  EXAM: CHEST  2 VIEW  COMPARISON:  None.  FINDINGS: The heart size and mediastinal contours are within normal limits. Both lungs are clear. The visualized skeletal structures are unremarkable.  IMPRESSION: No active cardiopulmonary disease.   Electronically Signed   By: Kathreen Devoid   On: 10/29/2013 01:40     EKG Interpretation None      MDM   Final diagnoses:  Right-sided chest wall pain   38 yo female with 1 day of right lateral chest wall pain.  Will treat pain.  Pt has history of chicken pox, but no signs of shingles at this time-pt but advised to watch for onset of rash.    Kalman Drape, MD 10/29/13 [phone]

## 2013-11-13 ENCOUNTER — Emergency Department (HOSPITAL_COMMUNITY)
Admission: EM | Admit: 2013-11-13 | Discharge: 2013-11-13 | Disposition: A | Discharge status: Home / Self Care | Payer: PRIVATE HEALTH INSURANCE | Attending: Emergency Medicine | Admitting: Emergency Medicine

## 2013-11-13 ENCOUNTER — Encounter (HOSPITAL_COMMUNITY): Payer: Self-pay | Admitting: Emergency Medicine

## 2013-11-13 DIAGNOSIS — H9209 Otalgia, unspecified ear: Secondary | ICD-10-CM | POA: Insufficient documentation

## 2013-11-13 DIAGNOSIS — R110 Nausea: Secondary | ICD-10-CM | POA: Insufficient documentation

## 2013-11-13 DIAGNOSIS — R519 Headache, unspecified: Secondary | ICD-10-CM

## 2013-11-13 DIAGNOSIS — F40298 Other specified phobia: Secondary | ICD-10-CM | POA: Insufficient documentation

## 2013-11-13 DIAGNOSIS — H53149 Visual discomfort, unspecified: Secondary | ICD-10-CM | POA: Insufficient documentation

## 2013-11-13 DIAGNOSIS — J45909 Unspecified asthma, uncomplicated: Secondary | ICD-10-CM | POA: Insufficient documentation

## 2013-11-13 DIAGNOSIS — R51 Headache: Secondary | ICD-10-CM | POA: Insufficient documentation

## 2013-11-13 DIAGNOSIS — Z791 Long term (current) use of non-steroidal anti-inflammatories (NSAID): Secondary | ICD-10-CM | POA: Insufficient documentation

## 2013-11-13 DIAGNOSIS — G40909 Epilepsy, unspecified, not intractable, without status epilepticus: Secondary | ICD-10-CM | POA: Insufficient documentation

## 2013-11-13 DIAGNOSIS — R42 Dizziness and giddiness: Secondary | ICD-10-CM | POA: Insufficient documentation

## 2013-11-13 MED ORDER — SODIUM CHLORIDE 0.9 % IV BOLUS (SEPSIS)
1000.0000 mL | Freq: Once | INTRAVENOUS | Status: AC
  Administered 2013-11-13: 1000 mL via INTRAVENOUS

## 2013-11-13 MED ORDER — METOCLOPRAMIDE HCL 5 MG/ML IJ SOLN
5.0000 mg | Freq: Once | INTRAMUSCULAR | Status: AC
  Administered 2013-11-13: 5 mg via INTRAVENOUS
  Filled 2013-11-13: qty 2

## 2013-11-13 MED ORDER — DEXAMETHASONE SODIUM PHOSPHATE 4 MG/ML IJ SOLN
4.0000 mg | Freq: Once | INTRAMUSCULAR | Status: AC
  Administered 2013-11-13: 4 mg via INTRAVENOUS
  Filled 2013-11-13: qty 1

## 2013-11-13 MED ORDER — DIPHENHYDRAMINE HCL 50 MG/ML IJ SOLN
12.5000 mg | Freq: Once | INTRAMUSCULAR | Status: AC
  Administered 2013-11-13: 12.5 mg via INTRAVENOUS
  Filled 2013-11-13: qty 1

## 2013-11-13 MED ORDER — PHENYTOIN SODIUM EXTENDED 100 MG PO CAPS: 100.0000 mg | ORAL_CAPSULE | Freq: Three times a day (TID) | ORAL | Status: DC

## 2013-11-13 NOTE — ED Notes (Signed)
Pt refusing IV/labs at this time, sts "i don't really need that, i just want to wait for the doctor".

## 2013-11-13 NOTE — ED Provider Notes (Signed)
Medical screening examination/treatment/procedure(s) were performed by non-physician practitioner and as supervising physician I was immediately available for consultation/collaboration.   EKG Interpretation None        Osvaldo Shipper, MD 11/13/13 2336

## 2013-11-13 NOTE — ED Provider Notes (Signed)
CSN: [id number]     Arrival date & time 11/13/13  1403 History   First MD Initiated Contact with Patient 11/13/13 1507     Chief Complaint  Patient presents with  . Headache  . Loss of Consciousness    HPI Comments: Patient is a 38 y.o. Female with a history of seizures who presents to [facility] ED with a 3 day history of headache and a seizure today at approximately 1 pm.  Patient states that she has had a generalized headache which feels like her head is in a vice grip for the past three days.  The headache is associated with intermittent 1 min episodes of sharp pain in the left parietal lobe.  Patient states the pain is a 6/10 pain.  Headache is associated with nausea, photophobia, noise sensitivity, and dizziness.  Patient has tried sleeping, Aleve, BC/Goody's powder, and cool compresses with little relief.  Patient denies headache being worse when she lays down, waking her from sleep, vomiting, loss of vision, fever, neck stiffness, weakness.  Patient states that she also had a seizure today.  It was witnessed by her roommate who said that she just started shaking.  Patient is unsure how long that it lasted.  She states that she had difficulty speaking and walking after the seizure.  She was previously treated with Dilantin but she has not taken it for the past two months.  Last seizure prior to today was reported to be 4 years ago.  Patient states that she is otherwise healthy.  She had a hysterectomy four years ago and was on estradiol for HRT, but has been out of this for the past month.   She reports increased amount of stress due to financial problems.  Patient is a 38 y.o. female presenting with headaches and syncope. The history is provided by the patient. No language interpreter was used.  Headache Associated symptoms: dizziness, ear pain, nausea, seizures and syncope   Associated symptoms: no congestion, no cough, no fever, no hearing loss, no sinus pressure and no vomiting   Loss of  Consciousness Associated symptoms: dizziness, headaches, nausea and seizures   Associated symptoms: no chest pain, no fever, no shortness of breath, no vomiting and no weakness     Past Medical History  Diagnosis Date  . Asthma   . Seizures    Past Surgical History  Procedure Laterality Date  . Cesarean section    . Abdominal hysterectomy    . Tympanostomy tube placement     Family History  Problem Relation Age of Onset  . Hypertension Mother   . Cancer Other    History  Substance Use Topics  . Smoking status: Never Smoker   . Smokeless tobacco: Not on file  . Alcohol Use: No   OB History   Grav Para Term Preterm Abortions TAB SAB Ect Mult Living                 Review of Systems  Constitutional: Negative for fever, chills, appetite change and unexpected weight change.  HENT: Positive for ear pain. Negative for congestion, hearing loss, sinus pressure, tinnitus and voice change.   Respiratory: Negative for cough, chest tightness and shortness of breath.   Cardiovascular: Positive for syncope. Negative for chest pain.  Gastrointestinal: Positive for nausea. Negative for vomiting.  Neurological: Positive for dizziness, seizures, light-headedness and headaches. Negative for tremors, syncope, facial asymmetry and weakness.  All other systems reviewed and are negative.  Allergies  Review of patient's  allergies indicates no known allergies.  Home Medications   Prior to Admission medications   Medication Sig Start Date End Date Taking? Authorizing Provider  Aspirin-Acetaminophen (GOODYS BODY PAIN PO) Take 1 Package by mouth 2 (two) times daily as needed (pain).   Yes Historical Provider, MD  cetirizine (ZYRTEC) 10 MG tablet Take 10 mg by mouth daily as needed for allergies.   Yes Historical Provider, MD  naproxen (NAPROSYN) 500 MG tablet Take 1 tablet (500 mg total) by mouth 2 (two) times daily with a meal. 10/29/13  Yes Kalman Drape, MD  naproxen sodium (ANAPROX) 220 MG  tablet Take 440 mg by mouth 2 (two) times daily as needed (pain).    Yes Historical Provider, MD   BP 113/40  Pulse 71  Temp(Src) 98.1 F (36.7 C) (Oral)  Resp 72  SpO2 100% Physical Exam  Nursing note and vitals reviewed. Constitutional: She is oriented to person, place, and time. She appears well-developed and well-nourished. No distress.  HENT:  Head: Normocephalic and atraumatic.  Nose: Nose normal.  Mouth/Throat: Oropharynx is clear and moist. No oropharyngeal exudate.  Eyes: Conjunctivae and EOM are normal. Pupils are equal, round, and reactive to light. Right eye exhibits no discharge. Left eye exhibits no discharge. No scleral icterus.  Neck: Normal range of motion. Neck supple. No JVD present. No tracheal deviation present. No Brudzinski's sign and no Kernig's sign noted. No thyromegaly present.  Cardiovascular: Normal rate, regular rhythm, normal heart sounds and intact distal pulses.  Exam reveals no gallop and no friction rub.   No murmur heard. Pulmonary/Chest: Effort normal and breath sounds normal. No respiratory distress. She has no wheezes. She has no rales. She exhibits no tenderness.  Abdominal: Soft. Bowel sounds are normal. She exhibits no distension and no mass. There is no tenderness. There is no rebound and no guarding.  Musculoskeletal: Normal range of motion. She exhibits no edema and no tenderness.  Lymphadenopathy:    She has no cervical adenopathy.  Neurological: She is alert and oriented to person, place, and time. No cranial nerve deficit.  Skin: Skin is warm and dry. No rash noted. She is not diaphoretic. No erythema. No pallor.  Psychiatric: She has a normal mood and affect. Her behavior is normal. Judgment and thought content normal.    ED Course  Procedures (including critical care time) Labs Review Labs Reviewed - No data to display  Imaging Review No results found.   EKG Interpretation None      MDM   Final diagnoses:  None   1.  Tension Headache - Will treat today with Reglan, Decadron, Benadryl, and a fluid bolus to try and stop headache cycle.  Patient reports good relief with current treatment.  Could potentially be from multiple sources including stopping estradiol and stress.      2. History of Seizures - Will give a prescription to restart dilantin and will give alternate places to reestablish a neurologist.  3. Menopause s/p hysterectomy - will give information for patient to reestablish care with a PCP and restart estradiol.     Kenard Gower, PA-C 11/13/13 1826

## 2013-11-13 NOTE — ED Notes (Signed)
Initial Contact - pt to [facility] reports c/o 7/10 L sided HA "and it's making me have seizures".  Pt reports one seizure earlier today while she was in bed "where I just stared off into space and was crying".  Neuros grossly intact at this time.  A+Ox4.  Speaking full/clear sentences.  Pt denies other complaints.  NAD.

## 2013-11-13 NOTE — Discharge Instructions (Signed)
Migraine Headache A migraine headache is an intense, throbbing pain on one or both sides of your head. A migraine can last for 30 minutes to several hours. CAUSES  The exact cause of a migraine headache is not always known. However, a migraine may be caused when nerves in the brain become irritated and release chemicals that cause inflammation. This causes pain. Certain things may also trigger migraines, such as:  Alcohol.  Smoking.  Stress.  Menstruation.  Aged cheeses.  Foods or drinks that contain nitrates, glutamate, aspartame, or tyramine.  Lack of sleep.  Chocolate.  Caffeine.  Hunger.  Physical exertion.  Fatigue.  Medicines used to treat chest pain (nitroglycerine), birth control pills, estrogen, and some blood pressure medicines. SIGNS AND SYMPTOMS  Pain on one or both sides of your head.  Pulsating or throbbing pain.  Severe pain that prevents daily activities.  Pain that is aggravated by any physical activity.  Nausea, vomiting, or both.  Dizziness.  Pain with exposure to bright lights, loud noises, or activity.  General sensitivity to bright lights, loud noises, or smells. Before you get a migraine, you may get warning signs that a migraine is coming (aura). An aura may include:  Seeing flashing lights.  Seeing bright spots, halos, or zig-zag lines.  Having tunnel vision or blurred vision.  Having feelings of numbness or tingling.  Having trouble talking.  Having muscle weakness. DIAGNOSIS  A migraine headache is often diagnosed based on:  Symptoms.  Physical exam.  A CT scan or MRI of your head. These imaging tests cannot diagnose migraines, but they can help rule out other causes of headaches. TREATMENT Medicines may be given for pain and nausea. Medicines can also be given to help prevent recurrent migraines.  HOME CARE INSTRUCTIONS  Only take over-the-counter or prescription medicines for pain or discomfort as directed by your  health care provider. The use of long-term narcotics is not recommended.  Lie down in a dark, quiet room when you have a migraine.  Keep a journal to find out what may trigger your migraine headaches. For example, write down:  What you eat and drink.  How much sleep you get.  Any change to your diet or medicines.  Limit alcohol consumption.  Quit smoking if you smoke.  Get 7 9 hours of sleep, or as recommended by your health care provider.  Limit stress.  Keep lights dim if bright lights bother you and make your migraines worse. SEEK IMMEDIATE MEDICAL CARE IF:   Your migraine becomes severe.  You have a fever.  You have a stiff neck.  You have vision loss.  You have muscular weakness or loss of muscle control.  You start losing your balance or have trouble walking.  You feel faint or pass out.  You have severe symptoms that are different from your first symptoms. MAKE SURE YOU:   Understand these instructions.  Will watch your condition.  Will get help right away if you are not doing well or get worse. Document Released: 06/12/2005 Document Revised: 04/02/2013 Document Reviewed: 02/17/2013 [employer] Patient Information 2014 [employer].  

## 2013-11-13 NOTE — Progress Notes (Signed)
  CARE MANAGEMENT ED NOTE 11/13/2013  Patient:  [name], [name]   Account Number:  [id number]  Date Initiated:  11/13/2013  Documentation initiated by:  Jackelyn Poling  Subjective/Objective Assessment:   38 yr old [employer] covered [place] pt with dx migraine headache No pcp listed     Subjective/Objective Assessment Detail:   States has been seen by [facility] but "looking for another doctor"  She agreed to a list of in network pcps for [employer]     Action/Plan:   Cm spoke with pt and family at bedside Offered her a 24 page list of [employer] in network providers of various specialties including internal medicine providers to assist with f/u care   Action/Plan Detail:   Anticipated DC Date:  11/13/2013     Status Recommendation to Physician:   Result of Recommendation:    Other ED [place]  PCP issues  Outpatient Services - Pt will follow up    Choice offered to / List presented to:            Status of service:  Completed, signed off  ED Comments:   ED Comments Detail:

## 2013-11-13 NOTE — ED Notes (Addendum)
Pt reports headache for past three days, "so bad I started having seizures." Pt reports seizures during pregnancy 4 years ago, but none since then. Unsure if it was ecclampsia. Pt states she passed out during episode. Denies any recent head trauma. Pt alert and oriented in triage, reports nausea, HA worse with light and noise.

## 2014-01-05 ENCOUNTER — Encounter (HOSPITAL_COMMUNITY): Payer: Self-pay | Admitting: Emergency Medicine

## 2014-01-05 ENCOUNTER — Emergency Department (HOSPITAL_COMMUNITY)
Admission: EM | Admit: 2014-01-05 | Discharge: 2014-01-05 | Discharge status: Against Medical Advice | Payer: PRIVATE HEALTH INSURANCE | Attending: Emergency Medicine | Admitting: Emergency Medicine

## 2014-01-05 DIAGNOSIS — R51 Headache: Secondary | ICD-10-CM | POA: Insufficient documentation

## 2014-01-05 DIAGNOSIS — J45909 Unspecified asthma, uncomplicated: Secondary | ICD-10-CM | POA: Insufficient documentation

## 2014-01-05 NOTE — ED Notes (Signed)
paitient reports that for the last week she has had weakness dizziness and SOB all the time. Also report right sided pelvic pain yesterday only. Patient reports that she has some noted blurred vision.

## 2014-01-06 ENCOUNTER — Encounter (HOSPITAL_COMMUNITY): Payer: Self-pay | Admitting: Emergency Medicine

## 2014-01-06 ENCOUNTER — Emergency Department (HOSPITAL_COMMUNITY)
Admission: EM | Admit: 2014-01-06 | Discharge: 2014-01-06 | Disposition: A | Discharge status: Home / Self Care | Payer: PRIVATE HEALTH INSURANCE | Source: Home / Self Care | Attending: Family Medicine | Admitting: Family Medicine

## 2014-01-06 DIAGNOSIS — G44209 Tension-type headache, unspecified, not intractable: Secondary | ICD-10-CM

## 2014-01-06 MED ORDER — DOXEPIN HCL 50 MG PO CAPS: 50.0000 mg | ORAL_CAPSULE | Freq: Every day | ORAL | Status: DC

## 2014-01-06 NOTE — ED Notes (Signed)
Pt reports feeling dizzy x1 week Sx also include HA, n/v, abd pain Seen at [name] ER yest Alert w/no signs of acute distress.

## 2014-01-06 NOTE — ED Notes (Signed)
Upon being d/c... Pt walked out of room and stated "this was a waste of time" Did not voice any concerns prior to being d/c.

## 2014-01-06 NOTE — ED Provider Notes (Signed)
CSN: [id number]     Arrival date & time 01/06/14  1032 History   First MD Initiated Contact with Patient 01/06/14 1157     Chief Complaint  Patient presents with  . Dizziness   (Consider location/radiation/quality/duration/timing/severity/associated sxs/prior Treatment) Patient is a 38 y.o. female presenting with headaches. The history is provided by the patient.  Headache Pain location:  Generalized Quality:  Dull Radiates to:  Does not radiate Onset quality:  Gradual Duration:  1 week Progression:  Unchanged Chronicity:  New Similar to prior headaches: no   Context: emotional stress   Context comment:  Husband left her 3 weeks ago, with 2 teenage boys. Relieved by:  None tried Associated symptoms: fatigue and nausea   Associated symptoms: no blurred vision, no pain, no fever, no neck pain, no paresthesias and no photophobia   Risk factors: insomnia     Past Medical History  Diagnosis Date  . Asthma   . Seizures    Past Surgical History  Procedure Laterality Date  . Cesarean section    . Abdominal hysterectomy    . Tympanostomy tube placement     Family History  Problem Relation Age of Onset  . Hypertension Mother   . Cancer Other    History  Substance Use Topics  . Smoking status: Never Smoker   . Smokeless tobacco: Not on file  . Alcohol Use: No   OB History   Grav Para Term Preterm Abortions TAB SAB Ect Mult Living                 Review of Systems  Constitutional: Positive for fatigue. Negative for fever.  Eyes: Negative for blurred vision, photophobia and pain.  Gastrointestinal: Positive for nausea.  Musculoskeletal: Negative for neck pain.  Neurological: Positive for headaches. Negative for paresthesias.  Psychiatric/Behavioral: Positive for sleep disturbance. The patient is nervous/anxious.     Allergies  Review of patient's allergies indicates no known allergies.  Home Medications   Prior to Admission medications   Medication Sig Start  Date End Date Taking? Authorizing Provider  Aspirin-Acetaminophen (GOODYS BODY PAIN PO) Take 1 Package by mouth 2 (two) times daily as needed (pain).    Historical Provider, MD  aspirin-acetaminophen-caffeine (EXCEDRIN MIGRAINE) [phone] MG per tablet Take 1 tablet by mouth every 6 (six) hours as needed for headache or migraine.    Historical Provider, MD  cetirizine (ZYRTEC) 10 MG tablet Take 10 mg by mouth daily as needed for allergies.    Historical Provider, MD  doxepin (SINEQUAN) 50 MG capsule Take 1 capsule (50 mg total) by mouth at bedtime. 01/06/14   Billy Fischer, MD  naproxen (NAPROSYN) 500 MG tablet Take 1 tablet (500 mg total) by mouth 2 (two) times daily with a meal. 10/29/13   Kalman Drape, MD  naproxen sodium (ANAPROX) 220 MG tablet Take 440 mg by mouth 2 (two) times daily as needed (pain).     Historical Provider, MD  phenytoin (DILANTIN) 100 MG ER capsule Take 1 capsule (100 mg total) by mouth 3 (three) times daily. 11/13/13   Courtney A Forcucci, PA-C   BP 128/66  Pulse 58  Temp(Src) 98.5 F (36.9 C) (Oral)  Resp 18  SpO2 100% Physical Exam  Nursing note and vitals reviewed. Constitutional: She is oriented to person, place, and time. She appears well-developed and well-nourished. She appears distressed.  HENT:  Head: Normocephalic.  Eyes: Conjunctivae are normal. Pupils are equal, round, and reactive to light.  Neck: Normal range  of motion. Neck supple.  Musculoskeletal: Normal range of motion.  Lymphadenopathy:    She has no cervical adenopathy.  Neurological: She is alert and oriented to person, place, and time. No cranial nerve deficit. Coordination normal.  Skin: Skin is warm and dry.    ED Course  Procedures (including critical care time) Labs Review Labs Reviewed - No data to display  Imaging Review No results found.   MDM   1. Emotional tension headache        Billy Fischer, MD 01/06/14 1221

## 2014-03-31 ENCOUNTER — Other Ambulatory Visit: Payer: Self-pay | Admitting: *Deleted

## 2014-03-31 DIAGNOSIS — R234 Changes in skin texture: Secondary | ICD-10-CM

## 2014-03-31 DIAGNOSIS — N644 Mastodynia: Secondary | ICD-10-CM

## 2014-06-26 DIAGNOSIS — Z923 Personal history of irradiation: Secondary | ICD-10-CM

## 2014-06-26 HISTORY — DX: Personal history of irradiation: Z92.3

## 2014-07-02 ENCOUNTER — Emergency Department (HOSPITAL_COMMUNITY)
Admission: EM | Admit: 2014-07-02 | Discharge: 2014-07-02 | Disposition: A | Discharge status: Home / Self Care | Payer: PRIVATE HEALTH INSURANCE | Source: Home / Self Care | Attending: Emergency Medicine | Admitting: Emergency Medicine

## 2014-07-02 ENCOUNTER — Encounter (HOSPITAL_COMMUNITY): Payer: Self-pay | Admitting: *Deleted

## 2014-07-02 DIAGNOSIS — S61011A Laceration without foreign body of right thumb without damage to nail, initial encounter: Secondary | ICD-10-CM

## 2014-07-02 DIAGNOSIS — Z23 Encounter for immunization: Secondary | ICD-10-CM

## 2014-07-02 MED ORDER — HYDROCODONE-ACETAMINOPHEN 5-325 MG PO TABS: 1.0000 | ORAL_TABLET | Freq: Four times a day (QID) | ORAL | Status: DC | PRN

## 2014-07-02 MED ORDER — LIDOCAINE HCL (PF) 1 % IJ SOLN
INTRAMUSCULAR | Status: AC
  Filled 2014-07-02: qty 5

## 2014-07-02 MED ORDER — HYDROCODONE-ACETAMINOPHEN 5-325 MG PO TABS
ORAL_TABLET | ORAL | Status: AC
  Filled 2014-07-02: qty 1

## 2014-07-02 MED ORDER — TETANUS-DIPHTH-ACELL PERTUSSIS 5-2.5-18.5 LF-MCG/0.5 IM SUSP
INTRAMUSCULAR | Status: AC
  Filled 2014-07-02: qty 0.5

## 2014-07-02 MED ORDER — TETANUS-DIPHTH-ACELL PERTUSSIS 5-2.5-18.5 LF-MCG/0.5 IM SUSP
0.5000 mL | Freq: Once | INTRAMUSCULAR | Status: AC
  Administered 2014-07-02: 0.5 mL via INTRAMUSCULAR

## 2014-07-02 MED ORDER — HYDROCODONE-ACETAMINOPHEN 5-325 MG PO TABS
1.0000 | ORAL_TABLET | Freq: Once | ORAL | Status: AC
  Administered 2014-07-02: 1 via ORAL

## 2014-07-02 NOTE — Discharge Instructions (Signed)
Keep the wound dry for the next 48 hours. Change the bandage twice a day and apply some antibiotic ointment. After 48 hours you can wash it with running water, but do not soak the finger.  If you develop redness, swelling or drainage from the cut, come back right away. Otherwise, come back in 1 week for suture removal.

## 2014-07-02 NOTE — ED Provider Notes (Signed)
CSN: [id number]     Arrival date & time 07/02/14  1714 History   First MD Initiated Contact with Patient 07/02/14 1737     Chief Complaint  Patient presents with  . Extremity Laceration   (Consider location/radiation/quality/duration/timing/severity/associated sxs/prior Treatment) HPI  She is a 39 year old woman coming in for evaluation of right thumb laceration. She states she was doing something with a kerosene heater when her thumb slipped and got cut on the metal edge. She can move her thumb. This occurred today at about 5:15.  Past Medical History  Diagnosis Date  . Asthma   . Seizures    Past Surgical History  Procedure Laterality Date  . Cesarean section  [id number], 2004    x 3  . Tympanostomy tube placement    . Abdominal hysterectomy  2012   Family History  Problem Relation Age of Onset  . Hypertension Mother   . Cancer Other    History  Substance Use Topics  . Smoking status: Never Smoker   . Smokeless tobacco: Not on file  . Alcohol Use: No   OB History    No data available     Review of Systems  Skin: Positive for wound.    Allergies  Review of patient's allergies indicates no known allergies.  Home Medications   Prior to Admission medications   Medication Sig Start Date End Date Taking? Authorizing Provider  Aspirin-Acetaminophen (GOODYS BODY PAIN PO) Take 1 Package by mouth 2 (two) times daily as needed (pain).   Yes Historical Provider, MD  aspirin-acetaminophen-caffeine (EXCEDRIN MIGRAINE) [phone] MG per tablet Take 1 tablet by mouth every 6 (six) hours as needed for headache or migraine.   Yes Historical Provider, MD  cetirizine (ZYRTEC) 10 MG tablet Take 10 mg by mouth daily as needed for allergies.    Historical Provider, MD  doxepin (SINEQUAN) 50 MG capsule Take 1 capsule (50 mg total) by mouth at bedtime. 01/06/14   Billy Fischer, MD  HYDROcodone-acetaminophen (NORCO) 5-325 MG per tablet Take 1 tablet by mouth every 6 (six) hours as needed  for moderate pain. 07/02/14   Melony Overly, MD  naproxen (NAPROSYN) 500 MG tablet Take 1 tablet (500 mg total) by mouth 2 (two) times daily with a meal. 10/29/13   Kalman Drape, MD  naproxen sodium (ANAPROX) 220 MG tablet Take 440 mg by mouth 2 (two) times daily as needed (pain).     Historical Provider, MD  phenytoin (DILANTIN) 100 MG ER capsule Take 1 capsule (100 mg total) by mouth 3 (three) times daily. 11/13/13   Courtney A Forcucci, PA-C   BP 146/98 mmHg  Pulse 72  Temp(Src) 98.1 F (36.7 C) (Oral)  Resp 18  SpO2 100% Physical Exam  Constitutional: She is oriented to person, place, and time. She appears well-developed and well-nourished. She appears distressed.  Cardiovascular: Normal rate.   Pulmonary/Chest: Effort normal.  Neurological: She is alert and oriented to person, place, and time.  Skin:  2cm laceration to right lateral thumb at DIP joint.  Able to flex and extend thumb.  Brisk cap refill in distal thumb.    ED Course  LACERATION REPAIR Date/Time: 07/02/2014 6:57 PM Performed by: Melony Overly Authorized by: Melony Overly Consent: Verbal consent obtained. Risks and benefits: risks, benefits and alternatives were discussed Consent given by: patient Patient understanding: patient states understanding of the procedure being performed Patient identity confirmed: verbally with patient Time out: Immediately prior to procedure a "time out"  was called to verify the correct patient, procedure, equipment, support staff and site/side marked as required. Body area: upper extremity Location details: right thumb Laceration length: 2 cm Tendon involvement: none Nerve involvement: none Vascular damage: no Anesthesia: local infiltration Local anesthetic: lidocaine 2% without epinephrine Anesthetic total: 1 ml Irrigation solution: saline Irrigation method: syringe Amount of cleaning: standard Degree of undermining: minimal Skin closure: 4-0 Prolene Number of sutures:  5 Technique: simple Approximation: close Approximation difficulty: simple Dressing: antibiotic ointment and 4x4 sterile gauze Patient tolerance: Patient tolerated the procedure well with no immediate complications   (including critical care time) Labs Review Labs Reviewed - No data to display  Imaging Review No results found.   MDM   1. Laceration of right thumb, initial encounter    TDaP given. Norco 5-325 milligrams by mouth given. 5 sutures placed.  Wound care instructions as in after visit summary. Reviewed signs of infection. Follow-up in one week for suture removal.    Melony Overly, MD 07/02/14 [phone]

## 2014-07-02 NOTE — ED Notes (Signed)
Cut R thumb on metal piece of a kerosene heater after putting in wick @ 1800.  Bleeding stopped.  C/o tingling on tip of thumb.  Good ROM.

## 2014-07-09 ENCOUNTER — Emergency Department (INDEPENDENT_AMBULATORY_CARE_PROVIDER_SITE_OTHER)
Admission: EM | Admit: 2014-07-09 | Discharge: 2014-07-09 | Disposition: A | Discharge status: Home / Self Care | Payer: PRIVATE HEALTH INSURANCE | Source: Home / Self Care | Attending: Family Medicine | Admitting: Family Medicine

## 2014-07-09 ENCOUNTER — Ambulatory Visit
Admission: RE | Admit: 2014-07-09 | Discharge: 2014-07-09 | Disposition: A | Discharge status: Home / Self Care | Payer: PRIVATE HEALTH INSURANCE | Source: Ambulatory Visit | Attending: *Deleted | Admitting: *Deleted

## 2014-07-09 ENCOUNTER — Encounter (HOSPITAL_COMMUNITY): Payer: Self-pay | Admitting: Emergency Medicine

## 2014-07-09 ENCOUNTER — Other Ambulatory Visit: Payer: Self-pay | Admitting: *Deleted

## 2014-07-09 DIAGNOSIS — N644 Mastodynia: Secondary | ICD-10-CM

## 2014-07-09 DIAGNOSIS — R234 Changes in skin texture: Secondary | ICD-10-CM

## 2014-07-09 DIAGNOSIS — S61011D Laceration without foreign body of right thumb without damage to nail, subsequent encounter: Secondary | ICD-10-CM

## 2014-07-09 DIAGNOSIS — Z4802 Encounter for removal of sutures: Secondary | ICD-10-CM

## 2014-07-09 DIAGNOSIS — L03011 Cellulitis of right finger: Secondary | ICD-10-CM

## 2014-07-09 MED ORDER — SULFAMETHOXAZOLE-TRIMETHOPRIM 800-160 MG PO TABS: 2.0000 | ORAL_TABLET | Freq: Two times a day (BID) | ORAL | Status: DC

## 2014-07-09 NOTE — Discharge Instructions (Signed)
Thank you for coming in today. Take the antibiotic twice daily for 1 week.  Go to the ER if you get worse.  Come back in 2 days unless all better.   Cellulitis Cellulitis is an infection of the skin and the tissue beneath it. The infected area is usually red and tender. Cellulitis occurs most often in the arms and lower legs.  CAUSES  Cellulitis is caused by bacteria that enter the skin through cracks or cuts in the skin. The most common types of bacteria that cause cellulitis are staphylococci and streptococci. SIGNS AND SYMPTOMS   Redness and warmth.  Swelling.  Tenderness or pain.  Fever. DIAGNOSIS  Your health care provider can usually determine what is wrong based on a physical exam. Blood tests may also be done. TREATMENT  Treatment usually involves taking an antibiotic medicine. HOME CARE INSTRUCTIONS   Take your antibiotic medicine as directed by your health care provider. Finish the antibiotic even if you start to feel better.  Keep the infected arm or leg elevated to reduce swelling.  Apply a warm cloth to the affected area up to 4 times per day to relieve pain.  Take medicines only as directed by your health care provider.  Keep all follow-up visits as directed by your health care provider. SEEK MEDICAL CARE IF:   You notice red streaks coming from the infected area.  Your red area gets larger or turns dark in color.  Your bone or joint underneath the infected area becomes painful after the skin has healed.  Your infection returns in the same area or another area.  You notice a swollen bump in the infected area.  You develop new symptoms.  You have a fever. SEEK IMMEDIATE MEDICAL CARE IF:   You feel very sleepy.  You develop vomiting or diarrhea.  You have a general ill feeling (malaise) with muscle aches and pains. MAKE SURE YOU:   Understand these instructions.  Will watch your condition.  Will get help right away if you are not doing well or  get worse. Document Released: 03/22/2005 Document Revised: 10/27/2013 Document Reviewed: 08/28/2011 [facility] Patient Information 2015 [place], [place]. This information is not intended to replace advice given to you by your health care provider. Make sure you discuss any questions you have with your health care provider.

## 2014-07-09 NOTE — ED Provider Notes (Signed)
[name] is a 39 y.o. female who presents to Urgent Care today for right thumb laceration. Patient suffered a laceration on January 7.  She was treated with sibling erupted sutures. She is here today for her sutures to be removed. Over the past 2 days her wound has become red and tender. No fevers or chills or discharge she feels well otherwise.   Past Medical History  Diagnosis Date  . Asthma   . Seizures    Past Surgical History  Procedure Laterality Date  . Cesarean section  [id number], 2004    x 3  . Tympanostomy tube placement    . Abdominal hysterectomy  2012   History  Substance Use Topics  . Smoking status: Never Smoker   . Smokeless tobacco: Not on file  . Alcohol Use: No   ROS as above Medications: No current facility-administered medications for this encounter.   Current Outpatient Prescriptions  Medication Sig Dispense Refill  . Aspirin-Acetaminophen (GOODYS BODY PAIN PO) Take 1 Package by mouth 2 (two) times daily as needed (pain).    Marland Kitchen aspirin-acetaminophen-caffeine (EXCEDRIN MIGRAINE) 250-250-65 MG per tablet Take 1 tablet by mouth every 6 (six) hours as needed for headache or migraine.    . cetirizine (ZYRTEC) 10 MG tablet Take 10 mg by mouth daily as needed for allergies.    Marland Kitchen doxepin (SINEQUAN) 50 MG capsule Take 1 capsule (50 mg total) by mouth at bedtime. 15 capsule 0  . HYDROcodone-acetaminophen (NORCO) 5-325 MG per tablet Take 1 tablet by mouth every 6 (six) hours as needed for moderate pain. 20 tablet 0  . naproxen (NAPROSYN) 500 MG tablet Take 1 tablet (500 mg total) by mouth 2 (two) times daily with a meal. 30 tablet 0  . naproxen sodium (ANAPROX) 220 MG tablet Take 440 mg by mouth 2 (two) times daily as needed (pain).     . phenytoin (DILANTIN) 100 MG ER capsule Take 1 capsule (100 mg total) by mouth 3 (three) times daily. 21 capsule 0  . sulfamethoxazole-trimethoprim (SEPTRA DS) 800-160 MG per tablet Take 2 tablets by mouth 2 (two) times daily.  28 tablet 0   No Known Allergies   Exam:  BP 119/80 mmHg  Pulse 62  Temp(Src) 99.6 F (37.6 C) (Oral)  Resp 16  SpO2 100% Gen: Well NAD Left thumb localized erythema at the wound with tenderness. No pus expressed.  Sutures removed. Thumb motion intact. Capillary refill and sensation intact distally  Assessment and Plan: 39 y.o. female with laceration. Cellulitis at the wound. Treat with Bactrim. Return as needed. Watchful waiting.  Of note patient has a breast biopsy scheduled in the near future. If not improved recommend patient call her surgeon and ask about skin infections. The procedure may need to be delayed  Discussed warning signs or symptoms. Please see discharge instructions. Patient expresses understanding.     Gregor Hams, MD 07/09/14 1910

## 2014-07-09 NOTE — ED Notes (Signed)
Pt is here to have her sutures removed from her thumb.  Pt states mild concern from the redness on the one side of the laceration.  The laceration looks clean and healed.

## 2014-07-10 ENCOUNTER — Other Ambulatory Visit: Payer: Self-pay | Admitting: *Deleted

## 2014-07-10 DIAGNOSIS — N644 Mastodynia: Secondary | ICD-10-CM

## 2014-07-10 DIAGNOSIS — R234 Changes in skin texture: Secondary | ICD-10-CM

## 2014-07-14 ENCOUNTER — Ambulatory Visit
Admission: RE | Admit: 2014-07-14 | Discharge: 2014-07-14 | Disposition: A | Discharge status: Home / Self Care | Payer: PRIVATE HEALTH INSURANCE | Source: Ambulatory Visit | Attending: *Deleted | Admitting: *Deleted

## 2014-07-14 ENCOUNTER — Other Ambulatory Visit: Payer: Self-pay | Admitting: *Deleted

## 2014-07-14 DIAGNOSIS — R2232 Localized swelling, mass and lump, left upper limb: Secondary | ICD-10-CM

## 2014-07-14 DIAGNOSIS — R234 Changes in skin texture: Secondary | ICD-10-CM

## 2014-07-14 DIAGNOSIS — N644 Mastodynia: Secondary | ICD-10-CM

## 2014-07-14 HISTORY — PX: BREAST BIOPSY: SHX20

## 2014-07-15 ENCOUNTER — Other Ambulatory Visit: Payer: Self-pay | Admitting: *Deleted

## 2014-07-15 DIAGNOSIS — C50912 Malignant neoplasm of unspecified site of left female breast: Secondary | ICD-10-CM

## 2014-07-16 ENCOUNTER — Telehealth: Payer: Self-pay | Admitting: *Deleted

## 2014-07-16 ENCOUNTER — Encounter: Payer: Self-pay | Admitting: *Deleted

## 2014-07-16 DIAGNOSIS — C50412 Malignant neoplasm of upper-outer quadrant of left female breast: Secondary | ICD-10-CM | POA: Insufficient documentation

## 2014-07-16 HISTORY — DX: Malignant neoplasm of upper-outer quadrant of left female breast: C50.412

## 2014-07-16 NOTE — Telephone Encounter (Signed)
Confirmed [facility] for 07/22/14 at 0800.  Instructions and contact information given.

## 2014-07-22 ENCOUNTER — Encounter: Payer: Self-pay | Admitting: Skilled Nursing Facility1

## 2014-07-22 ENCOUNTER — Ambulatory Visit (INDEPENDENT_AMBULATORY_CARE_PROVIDER_SITE_OTHER): Payer: Self-pay | Admitting: Surgery

## 2014-07-22 ENCOUNTER — Ambulatory Visit: Payer: PRIVATE HEALTH INSURANCE | Attending: Surgery | Admitting: Physical Therapy

## 2014-07-22 ENCOUNTER — Ambulatory Visit (HOSPITAL_BASED_OUTPATIENT_CLINIC_OR_DEPARTMENT_OTHER): Payer: PRIVATE HEALTH INSURANCE | Admitting: Hematology and Oncology

## 2014-07-22 ENCOUNTER — Ambulatory Visit
Admission: RE | Admit: 2014-07-22 | Discharge: 2014-07-22 | Disposition: A | Discharge status: Home / Self Care | Payer: PRIVATE HEALTH INSURANCE | Source: Ambulatory Visit | Attending: Radiation Oncology | Admitting: Radiation Oncology

## 2014-07-22 ENCOUNTER — Ambulatory Visit: Payer: PRIVATE HEALTH INSURANCE

## 2014-07-22 ENCOUNTER — Encounter: Payer: Self-pay | Admitting: Hematology and Oncology

## 2014-07-22 ENCOUNTER — Other Ambulatory Visit (HOSPITAL_BASED_OUTPATIENT_CLINIC_OR_DEPARTMENT_OTHER): Payer: PRIVATE HEALTH INSURANCE

## 2014-07-22 ENCOUNTER — Encounter: Payer: Self-pay | Admitting: Physical Therapy

## 2014-07-22 VITALS — BP 119/73 | HR 70 | Temp 98.4°F | Resp 18 | Ht 63.0 in | Wt 230.5 lb

## 2014-07-22 DIAGNOSIS — C50912 Malignant neoplasm of unspecified site of left female breast: Secondary | ICD-10-CM

## 2014-07-22 DIAGNOSIS — R293 Abnormal posture: Secondary | ICD-10-CM

## 2014-07-22 DIAGNOSIS — C50412 Malignant neoplasm of upper-outer quadrant of left female breast: Secondary | ICD-10-CM

## 2014-07-22 DIAGNOSIS — Z170 Estrogen receptor positive status [ER+]: Secondary | ICD-10-CM

## 2014-07-22 DIAGNOSIS — M25512 Pain in left shoulder: Secondary | ICD-10-CM | POA: Insufficient documentation

## 2014-07-22 LAB — CBC WITH DIFFERENTIAL/PLATELET
BASO%: 1.4 % (ref 0.0–2.0)
Basophils Absolute: 0.1 10*3/uL (ref 0.0–0.1)
EOS ABS: 0.1 10*3/uL (ref 0.0–0.5)
EOS%: 1.8 % (ref 0.0–7.0)
HEMATOCRIT: 41 % (ref 34.8–46.6)
HGB: 13.1 g/dL (ref 11.6–15.9)
LYMPH%: 21.5 % (ref 14.0–49.7)
MCH: 26 pg (ref 25.1–34.0)
MCHC: 31.9 g/dL (ref 31.5–36.0)
MCV: 81.6 fL (ref 79.5–101.0)
MONO#: 0.3 10*3/uL (ref 0.1–0.9)
MONO%: 6.8 % (ref 0.0–14.0)
NEUT#: 3.2 10*3/uL (ref 1.5–6.5)
NEUT%: 68.5 % (ref 38.4–76.8)
Platelets: 292 10*3/uL (ref 145–400)
RBC: 5.02 10*6/uL (ref 3.70–5.45)
RDW: 15.2 % — ABNORMAL HIGH (ref 11.2–14.5)
WBC: 4.6 10*3/uL (ref 3.9–10.3)
lymph#: 1 10*3/uL (ref 0.9–3.3)

## 2014-07-22 LAB — COMPREHENSIVE METABOLIC PANEL (CC13)
ALK PHOS: 78 U/L (ref 40–150)
ALT: 24 U/L (ref 0–55)
AST: 19 U/L (ref 5–34)
Albumin: 3.9 g/dL (ref 3.5–5.0)
Anion Gap: 11 mEq/L (ref 3–11)
BUN: 14.4 mg/dL (ref 7.0–26.0)
CALCIUM: 9.5 mg/dL (ref 8.4–10.4)
CO2: 26 mEq/L (ref 22–29)
Chloride: 104 mEq/L (ref 98–109)
Creatinine: 0.9 mg/dL (ref 0.6–1.1)
EGFR: 83 mL/min/{1.73_m2} — ABNORMAL LOW (ref >90)
GLUCOSE: 88 mg/dL (ref 70–140)
Potassium: 4 mEq/L (ref 3.5–5.1)
SODIUM: 141 meq/L (ref 136–145)
TOTAL PROTEIN: 7.3 g/dL (ref 6.4–8.3)
Total Bilirubin: 0.55 mg/dL (ref 0.20–1.20)

## 2014-07-22 NOTE — Progress Notes (Signed)
Checked in new pt with no financial concerns prior to seeing the dr.  Informed pt if chemo is part of her treatment Raquel will call her ins to see if Josem Kaufmann is req and will obtain it if it is as well as contact foundations that offer copay assistance for chemo if needed.  She has Raquel's card for any billing or insurance questions or concerns.

## 2014-07-22 NOTE — Progress Notes (Signed)
Note created by Dr. Gudena during office visit. Copy to patient, original to scan. 

## 2014-07-22 NOTE — Therapy (Signed)
[place], [place], [address] Phone: [phone]   Fax:  [phone]  Physical Therapy Evaluation  Patient Details  Name: [name] MRN: [id number] Date of Birth: [birth year] Referring Provider:  Alphonsa Overall, MD  Encounter Date: 07/22/2014      PT End of Session - 07/22/14 1120    Visit Number 1   Number of Visits 1   PT Start Time 0938   PT Stop Time 1112   PT Time Calculation (min) 27 min   Activity Tolerance Patient tolerated treatment well   Behavior During Therapy [facility] for tasks assessed/performed      Past Medical History  Diagnosis Date  . Asthma   . Seizures   . Breast cancer of upper-outer quadrant of left female breast 07/16/2014  . Anxiety   . Depression     Past Surgical History  Procedure Laterality Date  . Cesarean section  [id number], 2004    x 3  . Tympanostomy tube placement    . Abdominal hysterectomy  2012    There were no vitals taken for this visit.  Visit Diagnosis:  Left shoulder pain - Plan: PT plan of care cert/re-cert  Breast cancer, left - Plan: PT plan of care cert/re-cert  Abnormal posture - Plan: PT plan of care cert/re-cert      Subjective Assessment - 07/22/14 1050    Symptoms Patient is here today for an assessment of her new breast cancer diagnosis.   Pertinent History Diagnosed with left invasive breast cancer on 07/13/14 which is ER/PR positive and HER2 negative.  Ki67 is 17%.   Patient Stated Goals Reduce lymphedema risk and learn post op shoulder ROM exercises   Currently in Pain? Yes   Pain Score 7    Pain Location Shoulder   Pain Orientation Left   Pain Descriptors / Indicators Sharp   Pain Type Acute pain   Pain Onset In the past 7 days   Pain Frequency Constant   Aggravating Factors  raising left arm   Pain Relieving Factors rest   Multiple Pain Sites No          [facility] PT Assessment - 07/22/14 0001    Assessment    Medical Diagnosis Left breast cancer   Onset Date 07/13/14   Precautions   Precautions Other (comment)  Active left breast cancer; seizures   Balance Screen   Has the patient fallen in the past 6 months No   Has the patient had a decrease in activity level because of a fear of falling?  No   Is the patient reluctant to leave their home because of a fear of falling?  No   Home Environment   Living Enviornment Private residence   Living Arrangements Spouse/significant other;Children  Boyfriend, 65, 66, and 69 y.o. 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   Vocation Requirements lifting, [employer] manager   Leisure She walks 30 minutes twice a week   Cognition   Overall Cognitive Status Within Functional Limits for tasks assessed   Posture/Postural Control   Posture/Postural Control Postural limitations   Postural Limitations Rounded Shoulders;Forward head   AROM   Right Shoulder Extension 54 Degrees   Right Shoulder Flexion 141 Degrees   Right Shoulder ABduction 154 Degrees   Right Shoulder Internal Rotation 69 Degrees   Right Shoulder  External Rotation 69 Degrees   Left Shoulder Extension 56 Degrees   Left Shoulder Flexion 134 Degrees  Painful   Left Shoulder ABduction 153 Degrees   Left Shoulder Internal Rotation 67 Degrees   Left Shoulder External Rotation 84 Degrees   Strength   Overall Strength Within functional limits for tasks performed      It is recommended that this patient attend the After Breast Cancer Class at [place].  This is a free one time class held the 1st and 3rd Monday of each month from 11-12.  The patient will learn further lymphedema risk reduction techniques and additional exercises for regaining shoulder mobility, posture, and strength.  She is encouraged to contact the [facility]  at [phone] when she is ready to attend the class.       LYMPHEDEMA/ONCOLOGY QUESTIONNAIRE - 07/22/14 1117    Type   Cancer Type Left breast   Lymphedema Assessments   Lymphedema Assessments Upper extremities   Right Upper Extremity Lymphedema   10 cm Proximal to Olecranon Process 35.5 cm   Olecranon Process 26.9 cm   10 cm Proximal to Ulnar Styloid Process 24.1 cm   Just Proximal to Ulnar Styloid Process 15.8 cm   Across Hand at [employer] 19.4 cm   At [place] of 2nd Digit 6.7 cm   Left Upper Extremity Lymphedema   10 cm Proximal to Olecranon Process 37.8 cm   Olecranon Process 26.7 cm   10 cm Proximal to Ulnar Styloid Process 23.1 cm   Just Proximal to Ulnar Styloid Process 15.4 cm   Across Hand at [employer] 19.2 cm   At [place] of 2nd Digit 6.6 cm            PT Education - 07/22/14 1119    Education provided Yes   Education Details Shoulder ROM exercises and lymphedema risk reduction   Person(s) Educated Patient;Caregiver(s)   Methods Explanation;Demonstration;Handout   Comprehension Verbalized understanding;Returned demonstration              Breast Clinic Goals - 07/22/14 1153    Patient will be able to verbalize understanding of pertinent lymphedema risk reduction practices relevant to her diagnosis specifically related to skin care.   Baseline No knowledge   Time 1   Period Days   Status Achieved   Patient will be able to return demonstrate and/or verbalize understanding of the post-op home exercise program related to regaining shoulder range of motion.   Baseline No knowledge   Time 1   Period Days   Status Achieved   Patient will be able to verbalize understanding of the importance of attending the postoperative After Breast Cancer Class for further lymphedema risk reduction education and therapeutic exercise.   Baseline No knowledge   Time 1   Period Days   Status Achieved              Plan - 07/22/14 1146    Clinical  Impression Statement Patient was seen today for a baseline assessment of her new left breast cancer.  She is planning to have a left lumpectomy and sentinel node biopsy with Oncotype testing followed by radiation and Tamoxifen.  She will benefit from following up with physical therapy after surgery to regain shoulder ROM and prevent lymphedema.   Pt will benefit from skilled therapeutic intervention in order to improve on the following deficits Decreased range of motion;Increased edema;Decreased strength;Decreased knowledge of precautions   Rehab Potential  Good   Clinical Impairments Affecting Rehab Potential none   PT Frequency One time visit   PT Treatment/Interventions Therapeutic exercise;Patient/family education   Consulted and Agree with Plan of Care Patient;Family member/caregiver   Family Member Consulted boyfriend       Patient will follow up at outpatient cancer rehab if needed following surgery.  If the patient requires physical therapy at that time, a specific plan will be dictated and sent to the referring physician for approval. The patient was educated today on appropriate basic range of motion exercises to begin post operatively and the importance of attending the After Breast Cancer class following surgery.  Patient was educated today on lymphedema risk reduction practices as it pertains to recommendations that will benefit the patient immediately following surgery.  She verbalized good understanding.  No additional physical therapy is indicated at this time.      Problem List Patient Active Problem List   Diagnosis Date Noted  . Breast cancer of upper-outer quadrant of left female breast 07/16/2014  . 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 12/20/2009  . SEIZURE DISORDER 07/07/2009  . DEPRESSION, RECURRENT 08/24/2008  . HYPERSOMNIA, IDIOPATHIC 08/24/2008  . 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 ABNORMAL RESULT, FUNCTION STUDY NEC 03/21/2007     Sarit Sparano,MARTI COOPER, PT 07/22/2014, 11:56 AM  [place], [place], [address] Phone: [phone]   Fax:  [phone]

## 2014-07-22 NOTE — H&P (Signed)
[name] 07/22/2014 8:47 AM Location: [facility] Patient #: [id number] DOB: 1975/08/26 Undefined / Language: [name] / Race: Undefined Female  History of Present Illness: The patient is a 39 year old female who presents with breast cancer. Her PCP is at [facility]. She cannot remember the physician's name that she sees there. She is at the [facility]. Her treating oncologist are [name]. Her boyfriend, [name], is with her.  She felt something in her left breast at the 12 o'clock position. So she went for a mammogram. The mass she felt did not correlate with the location of her breast cancer. Her grandmother had breast cancer in her 79's and her mother's sister had breast cancer in her 60's. She had a hysterectomy 12/20/2009 for pain. She had both her ovaries removed. She is unsure of who did the surgery. She was on Estradiol for about 8 months, but stopped it because of weight gain.  She had a mammogram on 07/09/2014 which showed 1. A 1.5 cm diameter spiculated mass in the upper outer left breast at 2 o'clock 15 cm from the nipple demonstrates mammographic and sonographic features highly suggestive of malignancy. 2. Approximately 3 cm inferior to the index mass there is an intramammary lymph node with questionable cortical thickening.  Her MRI is scheduled for 07/24/2014.  Her biopsy on 07/14/2014 ([id number]) showed IDC, ER - 100%, PR - 96%, Ki67 - 17%. Biopsy of lymph node was negative.  She will need a lumpectomy and SLNBx. She will need genetics.  I discussed the options for breast cancer treatment with the patient. I discussed a multidisciplinary approach to the treatment of breast cancer, which includes medical oncology and radiation oncology. I discussed the surgical options of lumpectomy vs. mastectomy. If mastectomy, there is the possibility of reconstruction. I discussed the options of lymph node biopsy. The  treatment plan depends on the pathologic staging of the tumor and the patient's personal wishes. The risks of surgery include, but are not limited to, bleeding, infection, the need for further surgery, and nerve injury. The patient has been given literature on the treatment of breast cancer.  Past Medical History: 1. Seizures with each pregnancy - cause unknown. No neurologic problems when not pregnant. 2. Has history of heart murmur 3.  She had an infection in her finger, for which she is finishing the Septra.  Current Outpatient Prescriptions  Medication Sig Dispense Refill  . naproxen sodium (ANAPROX) 220 MG tablet Take 440 mg by mouth 2 (two) times daily as needed (pain).     Marland Kitchen sulfamethoxazole-trimethoprim (SEPTRA DS) 800-160 MG per tablet Take 2 tablets by mouth 2 (two) times daily. 28 tablet 0   No current facility-administered medications for this visit.   Social History: Unmarried, but has live in boyfriend. Her boyfriend, [name], is with her. Has 3 children - [name] and 11 ([name]). 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 HEENT: Normal. Pupils equal.  Neck: Supple. No mass. No thyroid mass. Lymph Nodes: No supraclavicular, cervical, or axillay nodes.  Breast: Right - tatoo in upper right breast. No mass Left - Bruise at 3 o'clock. The mass she felt was at 12 o'clock. She showed this area to me, but it feels like breast tissue.  Lungs: Clear to auscultation and symmetric breath sounds. Heart: RRR. No murmur or rub.  Abdomen: Soft. No mass. No tenderness. No hernia. Normal bowel sounds. Pfannenstiel incision. Rectal: Not done.  Extremities: Good strength and ROM in upper and lower extremities.  Neurologic: Grossly intact to motor and sensory function. Psychiatric: Has normal mood and affect. Behavior  is normal.   Assessment & Plan:  BREAST CANCER, STAGE 1, LEFT (174.9  C50.912) Story: Her left breast biopsy on 07/14/2014 ([id number]) showed IDC, ER - 100%, PR - 96%, Ki67 - 17%. The tumor measured 1.5 cm on mammogram. Biopsy of lymph node was negative.  [facility] is at [facility]. Her treating oncologist are [name] and [name]. Impression:  IMPRESSION: 14 x 14 x 13 mm mass lateral left breast consistent with known malignancy.   RECOMMENDATION:  Proceed with treatment planning   She will need a lumpectomy and SLNBx.      Will schedule surgery after MRI results.  She will need genetics.  Dawn Newman, MD, FACS [facility] Pager: [phone] Office phone:  [phone]  

## 2014-07-22 NOTE — Progress Notes (Signed)
[place] NOTE  Patient Care Team: No Pcp Per Patient as PCP - General (General Practice) Alphonsa Overall, MD as Consulting Physician (General Surgery) Rulon Eisenmenger, MD as Consulting Physician (Hematology and Oncology) Thea Silversmith, MD as Consulting Physician (Radiation Oncology) Roselee Culver, RN as Registered Nurse [facility], RN as Registered Nurse  CHIEF COMPLAINTS/PURPOSE OF CONSULTATION:  Newly diagnosed breast cancer  HISTORY OF PRESENTING ILLNESS:  [name] 39 y.o. 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 She had a palpable lump in the left breast which was evaluated by mammogram that revealed 1.5 cm mass at 2:00 position. She also had 2 intramammary lymph nodes. One of which had cortical thickening. Biopsy of the left breast mass revealed grade 1-2 invasive ductal carcinoma that was ER 100%, PR 96%, Ki-67 70%, HER-2 negative. Biopsy of one of intramammary lymph nodes was benign. She was presented at the multidisciplinary breast cancer conference and she is here today at the [facility] clinic to discuss the treatment options.  Apart from the palpable breast lump she has no other complaints or concerns. She is slightly sore from the biopsies and has some bruising in the breast related to that.  I reviewed her records extensively and collaborated the history with the patient.  SUMMARY OF ONCOLOGIC HISTORY:   Breast cancer of upper-outer quadrant of left female breast   07/14/2014 Initial Diagnosis Left breast 2:00: Invasive ductal carcinoma with DCIS, intramammary lymph node biopsy negative, grade 2, ER 100%, PR 96% Ki-67 17%, HER-2 negative ratio 1.17   07/14/2014 Mammogram Left breast 2:00 position 1.5 cm mass with 2 intramammary lymph nodes one of which has cortical thickening which was biopsied    In terms of breast cancer risk profile:  She menarched at early age of 44 and she  had hysterectomy 4 years ago for painful periods She had 3 pregnancy, her first child was born at age 32  She has not received birth control pills.  She was never exposed to fertility medications or hormone replacement therapy.  She has  family history of Breast/GYN/GI cancer Breast cancer in her aunt in her 22s and a grandmother maternal side in her 61s had breast cancer  MEDICAL HISTORY:  Past Medical History  Diagnosis Date  . Asthma   . Seizures   . Breast cancer of upper-outer quadrant of left female breast 07/16/2014  . Anxiety   . Depression     SURGICAL HISTORY: Past Surgical History  Procedure Laterality Date  . Cesarean section  [id number], 2004    x 3  . Tympanostomy tube placement    . Abdominal hysterectomy  2012    SOCIAL HISTORY: History   Social History  . Marital Status: Single    Spouse Name: N/A    Number of Children: N/A  . Years of Education: N/A   Occupational History  . Not on file.   Social History Main Topics  . Smoking status: Never Smoker   . Smokeless tobacco: Not on file  . Alcohol Use: Yes     Comment: occ once a month  . Drug Use: No  . Sexual Activity: Not on file   Other Topics Concern  . Not on file   Social History Narrative    FAMILY HISTORY: Family History  Problem Relation Age of Onset  . Hypertension Mother   . Cancer Other   . Breast cancer Maternal Aunt   .  Breast cancer Maternal Grandmother     ALLERGIES:  has No Known Allergies.  MEDICATIONS:  Current Outpatient Prescriptions  Medication Sig Dispense Refill  . naproxen sodium (ANAPROX) 220 MG tablet Take 440 mg by mouth 2 (two) times daily as needed (pain).     Marland Kitchen sulfamethoxazole-trimethoprim (SEPTRA DS) 800-160 MG per tablet Take 2 tablets by mouth 2 (two) times daily. 28 tablet 0   No current facility-administered medications for this visit.    REVIEW OF SYSTEMS:   Constitutional: Denies fevers, chills or abnormal night sweats Eyes: Denies blurriness  of vision, double vision or watery eyes Ears, nose, mouth, throat, and face: Denies mucositis or sore throat Respiratory: Denies cough, dyspnea or wheezes Cardiovascular: Denies palpitation, chest discomfort or lower extremity swelling Gastrointestinal:  Denies nausea, heartburn or change in bowel habits Skin: Denies abnormal skin rashes Lymphatics: Denies new lymphadenopathy or easy bruising Neurological:Denies numbness, tingling or new weaknesses Behavioral/Psych: Mood is stable, no new changes  Breast: Lump in the left breast All other systems were reviewed with the patient and are negative.  PHYSICAL EXAMINATION: ECOG PERFORMANCE STATUS: 0 - Asymptomatic  Filed Vitals:   07/22/14 0842  BP: 119/73  Pulse: 70  Temp: 98.4 F (36.9 C)  Resp: 18   Filed Weights   07/22/14 0842  Weight: 230 lb 8 oz (104.554 kg)    GENERAL:alert, no distress and comfortable SKIN: skin color, texture, turgor are normal, no rashes or significant lesions EYES: normal, conjunctiva are pink and non-injected, sclera clear OROPHARYNX:no exudate, no erythema and lips, buccal mucosa, and tongue normal  NECK: supple, thyroid normal size, non-tender, without nodularity LYMPH:  no palpable lymphadenopathy in the cervical, axillary or inguinal LUNGS: clear to auscultation and percussion with normal breathing effort HEART: regular rate & rhythm and no murmurs and no lower extremity edema ABDOMEN:abdomen soft, non-tender and normal bowel sounds Musculoskeletal:no cyanosis of digits and no clubbing  PSYCH: alert & oriented x 3 with fluent speech NEURO: no focal motor/sensory deficits BREAST palpable lump in the left breast. No palpable axillary or supraclavicular lymphadenopathy (exam performed in the presence of a chaperone)   LABORATORY DATA:  I have reviewed the data as listed Lab Results  Component Value Date   WBC 4.6 07/22/2014   HGB 13.1 07/22/2014   HCT 41.0 07/22/2014   MCV 81.6 07/22/2014    PLT 292 07/22/2014   Lab Results  Component Value Date   NA 141 07/22/2014   K 4.0 07/22/2014   CL 100 03/25/2013   CO2 26 07/22/2014    RADIOGRAPHIC STUDIES: I have personally reviewed the radiological reports and agreed with the findings in the report. Results are summarized as above  ASSESSMENT AND PLAN:  Breast cancer of upper-outer quadrant of left female breast Left breast invasive ductal carcinoma 1.5 cm: Grade 1-2, 2 intramammary lymph nodes one of them was biopsied benign, ER 100%, PR 96%, Ki-67 17%, HER-2 negative T1 cN0 M0 stage IA clinical stage  Pathology and radiology counseling:Discussed with the patient, the details of pathology including the type of breast cancer,the clinical staging, the significance of ER, PR and HER-2/neu receptors and the implications for treatment. After reviewing the pathology in detail, we proceeded to discuss the different treatment options between surgery, radiation, chemotherapy, antiestrogen therapies.  Recommendation: 1. Breast conserving surgery followed by evaluation to see if she is node negative, then we will send for Oncotype DX  to determine role of chemotherapy. I discussed with her in detail Oncotype DX test  and the interpretation of the test results. 2. Adjuvant radiation therapy followed by antiestrogen therapy with tamoxifen for 10 years  Return to clinic after surgery to discuss final pathology and to send Oncotype DX.   All questions were answered. The patient knows to call the clinic with any problems, questions or concerns.    Rulon Eisenmenger, MD 11:26 AM

## 2014-07-22 NOTE — Progress Notes (Unsigned)
Subjective:     Patient ID: [name], female   DOB: 25-Jul-1975, 39 y.o.   MRN: [id number]  HPI   Review of Systems     Objective:   Physical Exam  To introduce the patient to nutrition during the diagnosis of breast cancer.    Assessment:     The patient has had a history of feet swelling, poor appetite, nausea/vomiting, and heartburn. Patient states she drinks 4 16 ounces of coffee every morning along with a 20 ounce regular soda and has a monster energy drink weekly. This dietitian educated the patient on the negative effects associated with increased amounts of caffeine intake and suggested she ween herself off of this amount of caffeinated beverages. This dietitian suggested she slowly ween herself off of these beverages so as to make it a permanent change and to not have the negative withdraw effects. This dietitian also educated the patient on the importance of a plant based diet in conjunction with daily physical activity.  The patients height is 5'3'' and weighs 230 pounds with a BMI of 40.9. 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     Handouts given: Plant based diet, Every Bite Counts, and Nutrition Suggestions for Breast Cancer Patients     Plan:     For the patient to comprehend the importance of a proper diet, physical activity, and cutting back on her caffeinated beverages. This dietitian provided the contact information for the oncology dietitian if the patient would like a follow-up appointment.

## 2014-07-22 NOTE — Assessment & Plan Note (Signed)
Left breast invasive ductal carcinoma 1.5 cm: Grade 1-2, 2 intramammary lymph nodes one of them was biopsied benign, ER 100%, PR 96%, Ki-67 17%, HER-2 negative T1 cN0 M0 stage IA clinical stage  Pathology and radiology counseling:Discussed with the patient, the details of pathology including the type of breast cancer,the clinical staging, the significance of ER, PR and HER-2/neu receptors and the implications for treatment. After reviewing the pathology in detail, we proceeded to discuss the different treatment options between surgery, radiation, chemotherapy, antiestrogen therapies.  Recommendation: 1. Breast conserving surgery followed by evaluation to see if she is node negative, then we will send for Oncotype DX  to determine role of chemotherapy. I discussed with her in detail Oncotype DX test and the interpretation of the test results. 2. Adjuvant radiation therapy followed by antiestrogen therapy with tamoxifen for 10 years  Return to clinic after surgery to discuss final pathology and to send Oncotype DX.

## 2014-07-22 NOTE — Patient Instructions (Signed)
ABC CLASS After Breast Cancer Class  After Breast Cancer Class is a specially designed exercise class to assist you in a safe recover after having breast cancer surgery.  In this class you will learn how to get back to full function whether your drains were just removed or if you had surgery a month ago.  This one-time class is held the 1st and 3rd Monday of every month from 11:00 a.m. until 12:00 noon at the [facility] located at [place], [place] [address]  This class is FREE and space is limited. For more information or to register for the next available class, call [phone].  Class Goals   Understand specific stretches to improve the flexibility of you chest and shoulder.  Learn ways to safely strengthen your upper body and improve your posture.  Understand the warning signs of infection and why you may be at risk for an arm infection.  Learn about Lymphedema and prevention.  ** You do not attend this class until after surgery.  Drains mus be removed to participateABC CLASS After Breast Cancer Class  After Breast Cancer Class is a specially designed exercise class to assist you in a safe recover after having breast cancer surgery.  In this class you will learn how to get back to full function whether your drains were just removed or if you had surgery a month ago.  This one-time class is held the 1st and 3rd Monday of every month from 11:00 a.m. until 12:00 noon at the [facility] located at [place], [place] [address]  This class is FREE and space is limited. For more information or to register for the next available class, call [phone].  Class Goals   Understand specific stretches to improve the flexibility of you chest and shoulder.  Learn ways to safely strengthen your upper body and improve your posture.  Understand the warning signs of infection and why you may be at  risk for an arm infection.  Learn about Lymphedema and prevention.  ** You do not attend this class until after surgery.  Drains must be removed to participate  Patient was instructed today in a home exercise program today for post op shoulder range of motion. These included active assist shoulder flexion in sitting, scapular retraction, wall walking with shoulder abduction, and hands behind head external rotation.  She was encouraged to do these twice a day, holding 3 seconds and repeating 5 times when permitted by her physician.

## 2014-07-22 NOTE — Progress Notes (Signed)
[name] is a very pleasant 39 y.o. female from [place], [place] with newly diagnosed grade 2 invasive ductal carcinoma with foci of DCIS of the left breast.  Biopsy results revealed the tumor's hormone status as ER positive, PR positive, and HER2/neu negative. Ki67 is 17%.  She presents today with her boyfriend to the [facility]) for treatment consideration and recommendations from the breast surgeon, radiation oncologist, and medical oncologist.     I briefly met with [name] and her boyfriend during her [facility] visit today. We discussed the purpose of the Survivorship Clinic, which will include monitoring for recurrence, coordinating completion of age and gender-appropriate cancer screenings, promotion of overall wellness, as well as managing potential late/long-term side effects of anti-cancer treatments.    As of today, the treatment plan for [name] will likely include surgery and radiation therapy.  She will meet with the Genetics Counselor due to her current age with a new diagnosis of breast cancer. Anti-estrogen treatment will be considered for her as well. The intent of treatment for [name] is cure, therefore she will be eligible for the Survivorship Clinic upon her completion of treatment.  Her survivorship care plan (SCP) document will be drafted and updated throughout the course of her treatment trajectory. She will receive the SCP in an office visit with myself in the Survivorship Clinic once she has completed treatment.   [name] was encouraged to ask questions and all questions were answered to her satisfaction.  She was given my business card and encouraged to contact me with any concerns regarding survivorship.  I look forward to participating in her care.   Mike Craze, NP [place] [phone]

## 2014-07-22 NOTE — Progress Notes (Signed)
  Radiation Oncology         [phone]) [phone] ________________________________  Initial outpatient Consultation - Date: 07/22/2014   Name: [name] MRN: [id number]   DOB: [birth year]  REFERRING PHYSICIAN: Alphonsa Overall, MD  DIAGNOSIS: No diagnosis found.  STAGE: Breast cancer of upper-outer quadrant of left female breast   Staging form: Breast, AJCC 7th Edition     Clinical stage from 07/22/2014: Stage IA (T1c, N0, M0) - Unsigned   HISTORY OF PRESENT ILLNESS::[name] is a 39 y.o. female  Who presented with a palpable left breast mass with 2 adjacent lymph nodes. The mass measured 1.5 x 1.1 x 1.0 cm.  This mass was biopsied and was an invasive ductal carcinoma which was ER/PR+ and HER2- and Ki67 was low at 17%. MRI is scheduled for Friday. One of the lymph nodes with a thickened cortex was biopsied and was benign. She has a family history of a grandmother and aunt with breast cancer. She is GxP3 with her first birth at 64. She is post menopausal due to a hysterectomy several years ago.She is accompanied by her husband. She is interested in breast conservation.    PREVIOUS RADIATION THERAPY: No  FAMILY HISTORY:  Family History  Problem Relation Age of Onset  . Hypertension Mother   . Cancer Other   . Breast cancer Maternal Aunt   . Breast cancer Maternal Grandmother     SOCIAL HISTORY:  History  Substance Use Topics  . Smoking status: Never Smoker   . Smokeless tobacco: Not on file  . Alcohol Use: Yes     Comment: occ once a month    REVIEW OF SYSTEMS:  A 15 point review of systems is documented in the electronic medical record. This was obtained by the nursing staff. However, I reviewed this with the patient to discuss relevant findings and make appropriate changes.  Pertinent positives are included in the chart.   PHYSICAL EXAM: There were no vitals filed for this visit.. . Pleasant female in no distress. Palpable mass posteriorly in the left breast. No palpable  adenopathy. Alert and oriented. No palpable abnormalities of the right breast.   IMPRESSION: T1cN0 Invasive Ductal carcinoma.   PLAN:I spoke to the patient today regarding her diagnosis and options for treatment. We discussed the equivalence in terms of survival and local failure between mastectomy and breast conservation. We discussed the role of radiation in decreasing local failures in patients who undergo mastectomy and have positive lymph nodes or tumors greater than 5 cm. We discussed the role of radiation and decreasing local failures in patients who undergo lumpectomy.We discussed the possible side effects including but not limited to asymptomatic rib, heart and lung damage, heart disease, skin redness, fatigue, permanent skin darkening, and chest wall swelling. We discussed increased complications that can occur with reconstruction after radiation. We discussed the process of simulation and the placement tattoos. We discussed the low likelihood of secondary malignancies.    She met with surgery, medical oncology, physical therapy and our navigators.  She met with our survivorship navigator as well. She will undergo genetic testing and Oncotype after surgery. I will meet with her once she has healed from surgery and decision regarding chemotherapy has been made.   I spent 60 minutes  face to face with the patient and more than 50% of that time was spent in counseling and/or coordination of care.   ------------------------------------------------  Thea Silversmith, MD

## 2014-07-24 ENCOUNTER — Ambulatory Visit
Admission: RE | Admit: 2014-07-24 | Discharge: 2014-07-24 | Disposition: A | Discharge status: Home / Self Care | Payer: PRIVATE HEALTH INSURANCE | Source: Ambulatory Visit | Attending: *Deleted | Admitting: *Deleted

## 2014-07-24 DIAGNOSIS — C50912 Malignant neoplasm of unspecified site of left female breast: Secondary | ICD-10-CM

## 2014-07-24 MED ORDER — GADOBENATE DIMEGLUMINE 529 MG/ML IV SOLN
20.0000 mL | Freq: Once | INTRAVENOUS | Status: AC | PRN
  Administered 2014-07-24: 20 mL via INTRAVENOUS

## 2014-07-26 ENCOUNTER — Other Ambulatory Visit (INDEPENDENT_AMBULATORY_CARE_PROVIDER_SITE_OTHER): Payer: Self-pay | Admitting: Surgery

## 2014-07-26 DIAGNOSIS — C50912 Malignant neoplasm of unspecified site of left female breast: Secondary | ICD-10-CM

## 2014-07-27 ENCOUNTER — Telehealth: Payer: Self-pay | Admitting: *Deleted

## 2014-07-27 ENCOUNTER — Other Ambulatory Visit (INDEPENDENT_AMBULATORY_CARE_PROVIDER_SITE_OTHER): Payer: Self-pay | Admitting: Surgery

## 2014-07-27 DIAGNOSIS — C50912 Malignant neoplasm of unspecified site of left female breast: Secondary | ICD-10-CM

## 2014-07-27 NOTE — Telephone Encounter (Signed)
Spoke to pt concerning [place] from 07/22/14. Pt denies questions or concerns regarding dx or treatment care plan. Encourage pt to call with needs. Received verbal understanding. Informed pt that once we have her surgery date from Dr. Pollie Friar office, we will get her scheduled for f/u with Dr. Lindi Adie.

## 2014-07-29 ENCOUNTER — Encounter: Payer: Self-pay | Admitting: Genetic Counselor

## 2014-07-29 ENCOUNTER — Other Ambulatory Visit: Payer: PRIVATE HEALTH INSURANCE

## 2014-07-29 ENCOUNTER — Ambulatory Visit (HOSPITAL_BASED_OUTPATIENT_CLINIC_OR_DEPARTMENT_OTHER): Payer: PRIVATE HEALTH INSURANCE | Admitting: Genetic Counselor

## 2014-07-29 DIAGNOSIS — Z315 Encounter for genetic counseling: Secondary | ICD-10-CM

## 2014-07-29 DIAGNOSIS — Z803 Family history of malignant neoplasm of breast: Secondary | ICD-10-CM

## 2014-07-29 DIAGNOSIS — C50412 Malignant neoplasm of upper-outer quadrant of left female breast: Secondary | ICD-10-CM

## 2014-07-29 NOTE — Progress Notes (Signed)
REFERRING PROVIDER: Nicholas Lose, MD  PRIMARY PROVIDER:  No PCP Per Patient  PRIMARY REASON FOR VISIT:  1. Breast cancer of upper-outer quadrant of left female breast   2. Family history of breast cancer      HISTORY OF PRESENT ILLNESS:   [name], a 39 y.o. female, was seen for a [facility] cancer genetics consultation at the request of Dr. Lindi Adie due to a personal and family history of cancer.  [name] presents to clinic today to discuss the possibility of a hereditary predisposition to cancer, genetic testing, and to further clarify her future cancer risks, as well as potential cancer risks for family members.   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 The tumor is ER+/PR+/Her2-. This will be treated with lumpectomy and radiation.     CANCER HISTORY:    Breast cancer of upper-outer quadrant of left female breast   07/14/2014 Initial Diagnosis Left breast 2:00: Invasive ductal carcinoma with DCIS, intramammary lymph node biopsy negative, grade 2, ER 100%, PR 96% Ki-67 17%, HER-2 negative ratio 1.17   07/14/2014 Mammogram Left breast 2:00 position 1.5 cm mass with 2 intramammary lymph nodes one of which has cortical thickening which was biopsied     HORMONAL RISK FACTORS:  Menarche was at age 24.  First live birth at age 61.  OCP use for approximately 0 years.  Ovaries intact: yes.  Hysterectomy: no.  Menopausal status: premenopausal.  HRT use: 0 years. Colonoscopy: no; not examined. Mammogram within the last year: yes. Number of breast biopsies: 1. Up to date with pelvic exams:  yes. Any excessive radiation exposure in the past:  no  Past Medical History  Diagnosis Date  . Asthma   . Seizures   . Breast cancer of upper-outer quadrant of left female breast 07/16/2014  . Anxiety   . Depression   . Family history of breast cancer     Past Surgical History  Procedure Laterality Date  . Cesarean  section  [id number], 2004    x 3  . Tympanostomy tube placement    . Abdominal hysterectomy  2012    History   Social History  . Marital Status: Single    Spouse Name: N/A    Number of Children: 3  . Years of Education: N/A   Social History Main Topics  . Smoking status: Never Smoker   . Smokeless tobacco: None  . Alcohol Use: Yes     Comment: occ once a month  . Drug Use: No  . Sexual Activity: None   Other Topics Concern  . None   Social History Narrative     FAMILY HISTORY:  We obtained a detailed, 4-generation family history.  Significant diagnoses are listed below: Family History  Problem Relation Age of Onset  . Hypertension Mother   . Breast cancer Other     MGM's sister  . Breast cancer Maternal Grandmother     dx in her 23s   [name] does not have any information about her father or his family.  Her mother is 65 and had four brothers.  Neither her mother or her mother's brothers have ever had cancer.  [name] has multiple maternal cousins, all of whom are female except one.  Nobody has cancer.  Her maternal grandmother had breast cancer in her 12s and her grandmother's sister had breast cancer in her 41s. Patient's maternal ancestors are of  American [place] descent, and paternal ancestors are of [place] descent. There is no reported Ashkenazi Jewish ancestry. There is no known consanguinity.  GENETIC COUNSELING ASSESSMENT: [name] is a 39 y.o. female with a personal and family history of breast cancer which somewhat suggestive of a hereditary breast cancer syndrome and predisposition to cancer. We, therefore, discussed and recommended the following at today's visit.   DISCUSSION: We reviewed the characteristics, features and inheritance patterns of hereditary cancer syndromes. Based on her limited family history, both not knowing her father's side and having mostly female maternal relatives, her family history of breast cancer and personal history of  early onset breast cancer we reviewed hereditary breast cancer syndromes including those resulting from BRCA mutations as well as others.  We also discussed genetic testing, including the appropriate family members to test, the process of testing, insurance coverage and turn-around-time for results. If [name] tests positive, we would recommend that her mother get tested in order to determine which side of the family the genetic variant is coming from.  3We discussed the implications of a negative, positive and/or variant of uncertain significant result. We recommended [name] pursue genetic testing for the Breast/Ovarian cancer gene panel.  The Breast/Ovarian Cancer panel performs sequencing and deletion and duplicaton testing on the following 21 genes:  ATM, BARD1, BRCA1, BRCA2, BRIP1, CDH1, CHEK2, EPCAM, FANCC, MLH1, MSH2, MSH6, NBN, PALB2, PMS2, PTEN, RAD51C, RAD51D, STK11, TP53, and XRCC2.     PLAN: After considering the risks, benefits, and limitations,[name]  provided informed consent to pursue genetic testing and the blood sample was sent to [facility] for analysis of the Breast/Ovarian Cancer Panel. Results should be available within approximately 3-4 weeks' time, at which point they will be disclosed by telephone to [name], as will any additional recommendations warranted by these results. [name] will receive a summary of her genetic counseling visit and a copy of her results once available. This information will also be available in Epic. We encouraged [name] to remain in contact with cancer genetics annually so that we can continuously update the family history and inform her of any changes in cancer genetics and testing that may be of benefit for her family. [name] questions were answered to her satisfaction today. Our contact information was provided should additional questions or concerns arise.  Lastly, we encouraged [name] to remain in contact with cancer  genetics annually so that we can continuously update the family history and inform her of any changes in cancer genetics and testing that may be of benefit for this family.   [name] questions were answered to her satisfaction today. Our contact information was provided should additional questions or concerns arise. Thank you for the referral and allowing [place] to share in the care of your patient.   Junie Engram P. Florene Glen, Will, [facility] Certified Genetic Counselor [name] phone: [phone]  The patient was seen for a total of 35 minutes in face-to-face genetic counseling.  This patient was discussed with Drs. Magrinat, Lindi Adie and/or Burr Medico who agrees with the above.    _______________________________________________________________________ For Office Staff:  Number of people involved in session: 1 Was an Intern/ student involved with case: no

## 2014-07-31 ENCOUNTER — Encounter (HOSPITAL_BASED_OUTPATIENT_CLINIC_OR_DEPARTMENT_OTHER): Payer: Self-pay | Admitting: *Deleted

## 2014-08-03 ENCOUNTER — Ambulatory Visit
Admission: RE | Admit: 2014-08-03 | Discharge: 2014-08-03 | Disposition: A | Discharge status: Home / Self Care | Payer: PRIVATE HEALTH INSURANCE | Source: Ambulatory Visit | Attending: Surgery | Admitting: Surgery

## 2014-08-03 ENCOUNTER — Encounter (HOSPITAL_BASED_OUTPATIENT_CLINIC_OR_DEPARTMENT_OTHER): Payer: Self-pay | Admitting: *Deleted

## 2014-08-03 DIAGNOSIS — C50912 Malignant neoplasm of unspecified site of left female breast: Secondary | ICD-10-CM

## 2014-08-06 ENCOUNTER — Encounter (HOSPITAL_BASED_OUTPATIENT_CLINIC_OR_DEPARTMENT_OTHER)
Admission: RE | Disposition: A | Discharge status: Home / Self Care | Payer: Self-pay | Source: Ambulatory Visit | Attending: Surgery

## 2014-08-06 ENCOUNTER — Encounter (HOSPITAL_BASED_OUTPATIENT_CLINIC_OR_DEPARTMENT_OTHER): Payer: Self-pay | Admitting: Anesthesiology

## 2014-08-06 ENCOUNTER — Ambulatory Visit (HOSPITAL_BASED_OUTPATIENT_CLINIC_OR_DEPARTMENT_OTHER): Payer: PRIVATE HEALTH INSURANCE | Admitting: Anesthesiology

## 2014-08-06 ENCOUNTER — Ambulatory Visit (HOSPITAL_BASED_OUTPATIENT_CLINIC_OR_DEPARTMENT_OTHER)
Admission: RE | Admit: 2014-08-06 | Discharge: 2014-08-06 | Disposition: A | Discharge status: Home / Self Care | Payer: PRIVATE HEALTH INSURANCE | Source: Ambulatory Visit | Attending: Surgery | Admitting: Surgery

## 2014-08-06 ENCOUNTER — Ambulatory Visit (HOSPITAL_COMMUNITY)
Admission: RE | Admit: 2014-08-06 | Discharge: 2014-08-06 | Disposition: A | Discharge status: Home / Self Care | Payer: PRIVATE HEALTH INSURANCE | Source: Ambulatory Visit | Attending: Surgery | Admitting: Surgery

## 2014-08-06 ENCOUNTER — Ambulatory Visit
Admission: RE | Admit: 2014-08-06 | Discharge: 2014-08-06 | Disposition: A | Discharge status: Home / Self Care | Payer: PRIVATE HEALTH INSURANCE | Source: Ambulatory Visit | Attending: Surgery | Admitting: Surgery

## 2014-08-06 DIAGNOSIS — Z90710 Acquired absence of both cervix and uterus: Secondary | ICD-10-CM | POA: Diagnosis not present

## 2014-08-06 DIAGNOSIS — J45909 Unspecified asthma, uncomplicated: Secondary | ICD-10-CM | POA: Insufficient documentation

## 2014-08-06 DIAGNOSIS — Z6841 Body Mass Index (BMI) 40.0 and over, adult: Secondary | ICD-10-CM | POA: Insufficient documentation

## 2014-08-06 DIAGNOSIS — C50912 Malignant neoplasm of unspecified site of left female breast: Secondary | ICD-10-CM

## 2014-08-06 DIAGNOSIS — Z79899 Other long term (current) drug therapy: Secondary | ICD-10-CM | POA: Diagnosis not present

## 2014-08-06 DIAGNOSIS — Z90722 Acquired absence of ovaries, bilateral: Secondary | ICD-10-CM | POA: Diagnosis not present

## 2014-08-06 HISTORY — DX: Pain disorder exclusively related to psychological factors: F45.41

## 2014-08-06 HISTORY — DX: Personal history of other specified conditions: Z87.898

## 2014-08-06 HISTORY — PX: BREAST LUMPECTOMY: SHX2

## 2014-08-06 HISTORY — DX: Cardiac murmur, unspecified: R01.1

## 2014-08-06 LAB — POCT HEMOGLOBIN-HEMACUE: HEMOGLOBIN: 14.1 g/dL (ref 12.0–15.0)

## 2014-08-06 SURGERY — BREAST LUMPECTOMY WITH RADIOACTIVE SEED AND SENTINEL LYMPH NODE BIOPSY
Anesthesia: General | Site: Breast | Laterality: Left

## 2014-08-06 MED ORDER — GLYCOPYRROLATE 0.2 MG/ML IJ SOLN
INTRAMUSCULAR | Status: DC | PRN
  Administered 2014-08-06: 0.2 mg via INTRAVENOUS

## 2014-08-06 MED ORDER — DEXAMETHASONE SODIUM PHOSPHATE 4 MG/ML IJ SOLN
INTRAMUSCULAR | Status: DC | PRN
  Administered 2014-08-06: 10 mg via INTRAVENOUS

## 2014-08-06 MED ORDER — PROPOFOL 10 MG/ML IV BOLUS
INTRAVENOUS | Status: DC | PRN
  Administered 2014-08-06: 180 mg via INTRAVENOUS

## 2014-08-06 MED ORDER — OXYCODONE HCL 5 MG PO TABS
5.0000 mg | ORAL_TABLET | Freq: Once | ORAL | Status: AC | PRN
  Administered 2014-08-06: 5 mg via ORAL

## 2014-08-06 MED ORDER — MIDAZOLAM HCL 2 MG/2ML IJ SOLN
INTRAMUSCULAR | Status: AC
  Filled 2014-08-06: qty 2

## 2014-08-06 MED ORDER — ONDANSETRON HCL 4 MG/2ML IJ SOLN: 4.0000 mg | Freq: Once | INTRAMUSCULAR | Status: DC | PRN

## 2014-08-06 MED ORDER — CHLORHEXIDINE GLUCONATE 4 % EX LIQD: 1.0000 "application " | Freq: Once | CUTANEOUS | Status: DC

## 2014-08-06 MED ORDER — OXYCODONE HCL 5 MG PO TABS
ORAL_TABLET | ORAL | Status: AC
  Filled 2014-08-06: qty 1

## 2014-08-06 MED ORDER — CEFAZOLIN SODIUM-DEXTROSE 2-3 GM-% IV SOLR
2.0000 g | INTRAVENOUS | Status: AC
  Administered 2014-08-06: 2 g via INTRAVENOUS

## 2014-08-06 MED ORDER — MIDAZOLAM HCL 5 MG/5ML IJ SOLN
INTRAMUSCULAR | Status: DC | PRN
  Administered 2014-08-06: 2 mg via INTRAVENOUS

## 2014-08-06 MED ORDER — BUPIVACAINE-EPINEPHRINE (PF) 0.5% -1:200000 IJ SOLN
INTRAMUSCULAR | Status: DC | PRN
  Administered 2014-08-06: 10 mL

## 2014-08-06 MED ORDER — OXYCODONE HCL 5 MG/5ML PO SOLN: 5.0000 mg | Freq: Once | ORAL | Status: AC | PRN

## 2014-08-06 MED ORDER — LIDOCAINE HCL (CARDIAC) 20 MG/ML IV SOLN
INTRAVENOUS | Status: DC | PRN
  Administered 2014-08-06: 50 mg via INTRAVENOUS

## 2014-08-06 MED ORDER — MIDAZOLAM HCL 2 MG/ML PO SYRP: 12.0000 mg | ORAL_SOLUTION | Freq: Once | ORAL | Status: DC | PRN

## 2014-08-06 MED ORDER — BUPIVACAINE-EPINEPHRINE (PF) 0.5% -1:200000 IJ SOLN
INTRAMUSCULAR | Status: AC
  Filled 2014-08-06: qty 30

## 2014-08-06 MED ORDER — FENTANYL CITRATE 0.05 MG/ML IJ SOLN
INTRAMUSCULAR | Status: AC
  Filled 2014-08-06: qty 6

## 2014-08-06 MED ORDER — HYDROMORPHONE HCL 1 MG/ML IJ SOLN
INTRAMUSCULAR | Status: AC
  Filled 2014-08-06: qty 1

## 2014-08-06 MED ORDER — MIDAZOLAM HCL 2 MG/2ML IJ SOLN
1.0000 mg | INTRAMUSCULAR | Status: DC | PRN
  Administered 2014-08-06: 2 mg via INTRAVENOUS

## 2014-08-06 MED ORDER — TECHNETIUM TC 99M SULFUR COLLOID FILTERED
1.0000 | Freq: Once | INTRAVENOUS | Status: AC | PRN
Start: 2014-08-06 — End: 2014-08-06
  Administered 2014-08-06: 1 via INTRADERMAL

## 2014-08-06 MED ORDER — FENTANYL CITRATE 0.05 MG/ML IJ SOLN
INTRAMUSCULAR | Status: DC | PRN
  Administered 2014-08-06: 50 ug via INTRAVENOUS
  Administered 2014-08-06: 100 ug via INTRAVENOUS
  Administered 2014-08-06: 50 ug via INTRAVENOUS

## 2014-08-06 MED ORDER — FENTANYL CITRATE 0.05 MG/ML IJ SOLN
INTRAMUSCULAR | Status: AC
  Filled 2014-08-06: qty 2

## 2014-08-06 MED ORDER — FENTANYL CITRATE 0.05 MG/ML IJ SOLN
50.0000 ug | INTRAMUSCULAR | Status: DC | PRN
  Administered 2014-08-06: 100 ug via INTRAVENOUS

## 2014-08-06 MED ORDER — HYDROMORPHONE HCL 1 MG/ML IJ SOLN
0.2500 mg | INTRAMUSCULAR | Status: DC | PRN
  Administered 2014-08-06 (×2): 0.5 mg via INTRAVENOUS

## 2014-08-06 MED ORDER — LACTATED RINGERS IV SOLN
INTRAVENOUS | Status: DC
  Administered 2014-08-06 (×2): via INTRAVENOUS

## 2014-08-06 MED ORDER — HYDROCODONE-ACETAMINOPHEN 5-325 MG PO TABS: 1.0000 | ORAL_TABLET | Freq: Four times a day (QID) | ORAL | Status: DC | PRN

## 2014-08-06 SURGICAL SUPPLY — 56 items
APL SKNCLS STERI-STRIP NONHPOA (GAUZE/BANDAGES/DRESSINGS)
BENZOIN TINCTURE PRP APPL 2/3 (GAUZE/BANDAGES/DRESSINGS) IMPLANT
BINDER BREAST LRG (GAUZE/BANDAGES/DRESSINGS) IMPLANT
BINDER BREAST MEDIUM (GAUZE/BANDAGES/DRESSINGS) IMPLANT
BINDER BREAST XLRG (GAUZE/BANDAGES/DRESSINGS) IMPLANT
BINDER BREAST XXLRG (GAUZE/BANDAGES/DRESSINGS) ×2 IMPLANT
BLADE HEX COATED 2.75 (ELECTRODE) IMPLANT
BLADE SURG 10 STRL SS (BLADE) ×3 IMPLANT
BLADE SURG 15 STRL LF DISP TIS (BLADE) ×1 IMPLANT
BLADE SURG 15 STRL SS (BLADE) ×3
CANISTER SUC SOCK COL 7IN (MISCELLANEOUS) IMPLANT
CANISTER SUCT 1200ML W/VALVE (MISCELLANEOUS) ×3 IMPLANT
CHLORAPREP W/TINT 26ML (MISCELLANEOUS) ×3 IMPLANT
CLIP TI WIDE RED SMALL 6 (CLIP) ×2 IMPLANT
CLOSURE WOUND 1/4X4 (GAUZE/BANDAGES/DRESSINGS)
COVER BACK TABLE 60X90IN (DRAPES) ×3 IMPLANT
COVER MAYO STAND STRL (DRAPES) ×6 IMPLANT
COVER PROBE W GEL 5X96 (DRAPES) ×3 IMPLANT
DECANTER SPIKE VIAL GLASS SM (MISCELLANEOUS) IMPLANT
DEVICE DUBIN W/COMP PLATE 8390 (MISCELLANEOUS) ×3 IMPLANT
DRAPE LAPAROTOMY 100X72 PEDS (DRAPES) IMPLANT
DRAPE UTILITY XL STRL (DRAPES) ×3 IMPLANT
DRSG PAD ABDOMINAL 8X10 ST (GAUZE/BANDAGES/DRESSINGS) ×2 IMPLANT
ELECT COATED BLADE 2.86 ST (ELECTRODE) ×3 IMPLANT
ELECT REM PT RETURN 9FT ADLT (ELECTROSURGICAL) ×3
ELECTRODE REM PT RTRN 9FT ADLT (ELECTROSURGICAL) ×1 IMPLANT
GLOVE BIOGEL PI IND STRL 6.5 (GLOVE) IMPLANT
GLOVE BIOGEL PI IND STRL 7.0 (GLOVE) IMPLANT
GLOVE BIOGEL PI INDICATOR 6.5 (GLOVE) ×2
GLOVE BIOGEL PI INDICATOR 7.0 (GLOVE) ×2
GLOVE ECLIPSE 6.5 STRL STRAW (GLOVE) ×2 IMPLANT
GLOVE SURG SIGNA 7.5 PF LTX (GLOVE) ×3 IMPLANT
GOWN STRL REUS W/ TWL LRG LVL3 (GOWN DISPOSABLE) ×1 IMPLANT
GOWN STRL REUS W/ TWL XL LVL3 (GOWN DISPOSABLE) ×1 IMPLANT
GOWN STRL REUS W/TWL LRG LVL3 (GOWN DISPOSABLE) ×3
GOWN STRL REUS W/TWL XL LVL3 (GOWN DISPOSABLE) ×3
KIT MARKER MARGIN INK (KITS) ×3 IMPLANT
LIQUID BAND (GAUZE/BANDAGES/DRESSINGS) ×3 IMPLANT
NDL HYPO 25X1 1.5 SAFETY (NEEDLE) ×1 IMPLANT
NEEDLE HYPO 25X1 1.5 SAFETY (NEEDLE) ×3 IMPLANT
NS IRRIG 1000ML POUR BTL (IV SOLUTION) ×2 IMPLANT
PACK BASIN DAY SURGERY FS (CUSTOM PROCEDURE TRAY) ×3 IMPLANT
PENCIL BUTTON HOLSTER BLD 10FT (ELECTRODE) ×3 IMPLANT
SHEET MEDIUM DRAPE 40X70 STRL (DRAPES) IMPLANT
SLEEVE SCD COMPRESS KNEE MED (MISCELLANEOUS) ×3 IMPLANT
SPONGE GAUZE 4X4 12PLY STER LF (GAUZE/BANDAGES/DRESSINGS) IMPLANT
SPONGE LAP 18X18 X RAY DECT (DISPOSABLE) ×5 IMPLANT
STRIP CLOSURE SKIN 1/4X4 (GAUZE/BANDAGES/DRESSINGS) IMPLANT
SUT MON AB 5-0 PS2 18 (SUTURE) ×3 IMPLANT
SUT VICRYL 3-0 CR8 SH (SUTURE) ×5 IMPLANT
SYR CONTROL 10ML LL (SYRINGE) ×3 IMPLANT
TOWEL OR 17X24 6PK STRL BLUE (TOWEL DISPOSABLE) ×3 IMPLANT
TOWEL OR NON WOVEN STRL DISP B (DISPOSABLE) ×3 IMPLANT
TUBE CONNECTING 20'X1/4 (TUBING) ×1
TUBE CONNECTING 20X1/4 (TUBING) ×2 IMPLANT
YANKAUER SUCT BULB TIP NO VENT (SUCTIONS) ×3 IMPLANT

## 2014-08-06 NOTE — Anesthesia Procedure Notes (Addendum)
Anesthesia Regional Block:  Pectoralis block  Pre-Anesthetic Checklist: ,, timeout performed, Correct Patient, Correct Site, Correct Laterality, Correct Procedure, Correct Position, site marked, Risks and benefits discussed,  Surgical consent,  Pre-op evaluation,  At surgeon's request and post-op pain management  Laterality: Left and Upper  Prep: chloraprep       Needles:  Injection technique: Single-shot  Needle Type: Echogenic Needle     Needle Length: 9cm 9 cm Needle Gauge: 21 and 21 G    Additional Needles:  Procedures: ultrasound guided (picture in chart) Pectoralis block Narrative:  Start time: 08/06/2014 10:34 AM End time: 08/06/2014 10:39 AM Injection made incrementally with aspirations every 5 mL.  Performed by: Personally  Anesthesiologist: CREWS, DAVID A   Procedure Name: LMA Insertion Date/Time: 08/06/2014 11:27 AM Performed by: Toula Moos L Pre-anesthesia Checklist: Patient identified, Emergency Drugs available, Suction available, Patient being monitored and Timeout performed Patient Re-evaluated:Patient Re-evaluated prior to inductionOxygen Delivery Method: Circle System Utilized Preoxygenation: Pre-oxygenation with 100% oxygen Intubation Type: IV induction Ventilation: Mask ventilation without difficulty LMA: LMA inserted LMA Size: 4.0 Number of attempts: 1 Airway Equipment and Method: Bite block Placement Confirmation: positive ETCO2 Tube secured with: Tape Dental Injury: Teeth and Oropharynx as per pre-operative assessment

## 2014-08-06 NOTE — Transfer of Care (Signed)
Immediate Anesthesia Transfer of Care Note  Patient: [name] [facility]  Procedure(s) Performed: Procedure(s): LEFT BREAST LUMPECTOMY WITH RADIOACTIVE SEED AND LEFT AXILLARY SENTINEL LYMPH NODE BIOPSY (Left)  Patient Location: PACU  Anesthesia Type:GA combined with regional for post-op pain  Level of Consciousness: sedated and patient cooperative  Airway & Oxygen Therapy: Patient Spontanous Breathing and Patient connected to face mask oxygen  Post-op Assessment: Report given to RN and Post -op Vital signs reviewed and stable  Post vital signs: Reviewed and stable  Last Vitals:  Filed Vitals:   08/06/14 1045  BP: 106/60  Pulse: 82  Temp:   Resp: 18    Complications: No apparent anesthesia complications

## 2014-08-06 NOTE — H&P (View-Only) (Signed)
[name] 07/22/2014 8:47 AM Location: [facility] Patient #: [id number] DOB: Nov 06, 1975 Undefined / Language: [name] / Race: Undefined Female  History of Present Illness: The patient is a 39 year old female who presents with breast cancer. Her PCP is at [facility]. She cannot remember the physician's name that she sees there. She is at [facility]. Her treating oncologist are Christel Mormon. Her boyfriend, [name], is with her.  She felt something in her left breast at the 12 o'clock position. So she went for a mammogram. The mass she felt did not correlate with the location of her breast cancer. Her grandmother had breast cancer in her 6's and her mother's sister had breast cancer in her 12's. She had a hysterectomy 12/20/2009 for pain. She had both her ovaries removed. She is unsure of who did the surgery. She was on Estradiol for about 8 months, but stopped it because of weight gain.  She had a mammogram on 07/09/2014 which showed 1. A 1.5 cm diameter spiculated mass in the upper outer left breast at 2 o'clock 15 cm from the nipple demonstrates mammographic and sonographic features highly suggestive of malignancy. 2. Approximately 3 cm inferior to the index mass there is an intramammary lymph node with questionable cortical thickening.  Her MRI is scheduled for 07/24/2014.  Her biopsy on 07/14/2014 ([id number]) showed IDC, ER - 100%, PR - 96%, Ki67 - 17%. Biopsy of lymph node was negative.  She will need a lumpectomy and SLNBx. She will need genetics.  I discussed the options for breast cancer treatment with the patient. I discussed a multidisciplinary approach to the treatment of breast cancer, which includes medical oncology and radiation oncology. I discussed the surgical options of lumpectomy vs. mastectomy. If mastectomy, there is the possibility of reconstruction. I discussed the options of lymph node biopsy. The  treatment plan depends on the pathologic staging of the tumor and the patient's personal wishes. The risks of surgery include, but are not limited to, bleeding, infection, the need for further surgery, and nerve injury. The patient has been given literature on the treatment of breast cancer.  Past Medical History: 1. Seizures with each pregnancy - cause unknown. No neurologic problems when not pregnant. 2. Has history of heart murmur 3.  She had an infection in her finger, for which she is finishing the Septra.  Current Outpatient Prescriptions  Medication Sig Dispense Refill  . naproxen sodium (ANAPROX) 220 MG tablet Take 440 mg by mouth 2 (two) times daily as needed (pain).     Marland Kitchen sulfamethoxazole-trimethoprim (SEPTRA DS) 800-160 MG per tablet Take 2 tablets by mouth 2 (two) times daily. 28 tablet 0   No current facility-administered medications for this visit.   Social History: Unmarried, but has live in boyfriend. Her boyfriend, [name], is with her. Has 3 children - 52 [name]), [address] ([address] and 11 ([place]). 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 HEENT: Normal. Pupils equal.  Neck: Supple. No mass. No thyroid mass. Lymph Nodes: No supraclavicular, cervical, or axillay nodes.  Breast: Right - tatoo in upper right breast. No mass Left - Bruise at 3 o'clock. The mass she felt was at 12 o'clock. She showed this area to me, but it feels like breast tissue.  Lungs: Clear to auscultation and symmetric breath sounds. Heart: RRR. No murmur or rub.  Abdomen: Soft. No mass. No tenderness. No hernia. Normal bowel sounds. Pfannenstiel incision. Rectal: Not done.  Extremities: Good strength and ROM in upper and lower extremities.  Neurologic: Grossly intact to motor and sensory function. Psychiatric: Has normal mood and affect. Behavior  is normal.   Assessment & Plan:  BREAST CANCER, STAGE 1, LEFT (174.9  C50.912) Story: Her left breast biopsy on 07/14/2014 ([id number]) showed IDC, ER - 100%, PR - 96%, Ki67 - 17%. The tumor measured 1.5 cm on mammogram. Biopsy of lymph node was negative.  [facility] at [facility]. Her treating oncologist are Wentworth and Gudena. Impression:  IMPRESSION: 14 x 14 x 13 mm mass lateral left breast consistent with known malignancy.   RECOMMENDATION:  Proceed with treatment planning   She will need a lumpectomy and SLNBx.      Will schedule surgery after MRI results.  She will need genetics.  Yong Wahlquist, MD, [facility] Pager: [phone] Office phone:  [phone]  

## 2014-08-06 NOTE — Op Note (Signed)
08/06/2014  1:13 PM  PATIENT:  [name] DOB: December 11, 1975 MRN: [id number]  PREOP DIAGNOSIS:  left breast cancer  POSTOP DIAGNOSIS:   Left breast cancer, 3 o'clock position (T1, N0)  PROCEDURE:   Procedure(s):   LEFT BREAST LUMPECTOMY WITH RADIOACTIVE SEED AND LEFT AXILLARY SENTINEL LYMPH NODE BIOPSY (through single incision)  SURGEON:   Alphonsa Overall, M.D.  ANESTHESIA:   general  Anesthesiologist: Napoleon Form, MD CRNA: Marrianne Mood, CRNA  General  EBL:  <100  ml  DRAINS: none   LOCAL MEDICATIONS USED:   10 cc 1/2% marcaine  SPECIMEN:   Left breast lumpectomy (suture superior), Superior margin (suture anterior), left axillary sentinel lymph node x 2  COUNTS CORRECT:  YES  INDICATIONS FOR PROCEDURE:  [name] is a 39 y.o. (DOB: [birth year]) white  female whose primary care physician is Leonard Downing, MD and comes for left breast lumpectomy and left axillary sentinel lymph node biopsy.   The options for breast cancer treatment have been discussed with the patient. She elected to proceed with lumpectomy and axillary sentinel lymph node.     The indications and potential complications of surgery were explained to the patient. Potential complications include, but are not limited to, bleeding, infection, the need for further surgery, and nerve injury.     She had a I131 seed placed on 08/06/2014 in her left breast at [facility].  I confirmed the presence of the I131 seed in the pre op area using the Neoprobe.  The seed is in the 3 o'clock position of the left breast.   In the holding area, her left areola was injected with 1 millicurie of Technitium Sulfur Colloid.  OPERATIVE NOTE:   The patient was taken to room # 8 at [facility] where she underwent a general anesthesia  supervised by Anesthesiologist: Napoleon Form, MD CRNA: Marrianne Mood, CRNA. Her left breast and axilla were prepped with  ChloraPrep and sterilely draped.    A time-out  and the surgical check list was reviewed.    She had a good localization with technitium sulfur colloid, so I did not inject methylen blue.   I turned attention to the cancer which was about at the 3 o'clock position of the left breast.   I used the Neoprobe to identify the I131 seed.  I tried to excise an area around the tumor of at least 1 cm.    I excised this block of breast tissue approximately 4 cm by 5 cm  in diameter.   I painted the lumpectomy specimen with the 6 color paint kit and did a specimen mammogram which confirmed the mass, clip, and the seed were all in the right position and the specimen.  The specimen was sent to pathology who called back to confirm that they have the seed and the specimen.   The closest margin on the specimen mammogram was the superior margin, so I excised additional superior margin.  This was labeled with a suture anteriorly, and the superior and inferior side were painted.   I then started the left axillary sentinel lymph node biopsy.  Because the lumpectomy incision was so lateral, I was able to approach the axillary lymph node through the breast biopsy cavity.    I found a hot area at the junction of the breast and the pectoralis major muscle. I cut down and  identified 2 hot nodes with counts of 1600 and 600 and the background has  5 counts.    I checked her internal mammary nodes and supraclavicular nodes with the neoprobe and found no other hot area. The axillary node was then sent to pathology.    I then irrigated the wound with saline.  Dr. Al Corpus had put a pectoral block in preop.   I infiltrated an additional  10 mL of 1% local in the  Incisions.  I placed 6 clips to mark the breast biopsy cavity, at 12, 3, 6, and 9 o'clock. Two clips were placed on the pectoralis major.   I then closed all the wounds in layers using 3-0 Vicryl sutures for the deep layer. At the skin, I closed the incisions with a 4-0 Monocryl suture. The incision was then painted with  Aquaband.  She had gauze place over the wounds and placed in a breast binder.   The patient tolerated the procedure well, was transported to the recovery room in good condition. Sponge and needle count were correct at the end of the case.   Final pathology is pending.   Alphonsa Overall, MD, [facility] Pager: [phone] Office phone:  [phone]

## 2014-08-06 NOTE — Discharge Instructions (Signed)
[facility] - DISCHARGE INSTRUCTIONS TO PATIENT  Activity:  Driving - May drive in 2 or 3 days, if doing well and off pain meds   Lifting - Take it easy for 5 to 7 days, then no limit  Wound Care:   Leave bandage on for 2 days, then may remove and shower.  Diet:  As tolerated  Follow up appointment:  Call Dr. Pollie Friar office [facility]) at [phone] for an appointment in 2 to 3 weeks  Medications and dosages:  Resume your home medications.  You have a prescription for:  Vicodin  Call Dr. Lucia Gaskins or his office  [phone]) if you have:  Temperature greater than 100.4,  Persistent nausea and vomiting,  Severe uncontrolled pain,  Redness, tenderness, or signs of infection (pain, swelling, redness, odor or green/yellow discharge around the site),  Difficulty breathing, headache or visual disturbances,  Any other questions or concerns you may have after discharge.  In an emergency, call 911 or go to an Emergency Department at a nearby hospital.    Post Anesthesia Home Care Instructions  Activity: Get plenty of rest for the remainder of the day. A responsible adult should stay with you for 24 hours following the procedure.  For the next 24 hours, DO NOT: -Drive a car -Paediatric nurse -Drink alcoholic beverages -Take any medication unless instructed by your physician -Make any legal decisions or sign important papers.  Meals: Start with liquid foods such as gelatin or soup. Progress to regular foods as tolerated. Avoid greasy, spicy, heavy foods. If nausea and/or vomiting occur, drink only clear liquids until the nausea and/or vomiting subsides. Call your physician if vomiting continues.  Special Instructions/Symptoms: Your throat may feel dry or sore from the anesthesia or the breathing tube placed in your throat during surgery. If this causes discomfort, gargle with warm salt water. The discomfort should disappear within 24 hours.   Regional  Anesthesia Blocks  1. Numbness or the inability to move the "blocked" extremity may last from 3-48 hours after placement. The length of time depends on the medication injected and your individual response to the medication. If the numbness is not going away after 48 hours, call your surgeon.  2. The extremity that is blocked will need to be protected until the numbness is gone and the  Strength has returned. Because you cannot feel it, you will need to take extra care to avoid injury. Because it may be weak, you may have difficulty moving it or using it. You may not know what position it is in without looking at it while the block is in effect.  3. For blocks in the legs and feet, returning to weight bearing and walking needs to be done carefully. You will need to wait until the numbness is entirely gone and the strength has returned. You should be able to move your leg and foot normally before you try and bear weight or walk. You will need someone to be with you when you first try to ensure you do not fall and possibly risk injury.  4. Bruising and tenderness at the needle site are common side effects and will resolve in a few days.  5. Persistent numbness or new problems with movement should be communicated to the surgeon or the [facility] [phone] [place] [phone]).

## 2014-08-06 NOTE — Anesthesia Postprocedure Evaluation (Signed)
  Anesthesia Post-op Note  Patient: [name] [place]  Procedure(s) Performed: Procedure(s): LEFT BREAST LUMPECTOMY WITH RADIOACTIVE SEED AND LEFT AXILLARY SENTINEL LYMPH NODE BIOPSY (Left)  Patient Location: PACU  Anesthesia Type: General   Level of Consciousness: awake, alert  and oriented  Airway and Oxygen Therapy: Patient Spontanous Breathing  Post-op Pain: mild  Post-op Assessment: Post-op Vital signs reviewed  Post-op Vital Signs: Reviewed  Last Vitals:  Filed Vitals:   08/06/14 1455  BP: 128/71  Pulse: 66  Temp: 36.6 C  Resp: 20    Complications: No apparent anesthesia complications

## 2014-08-06 NOTE — Anesthesia Preprocedure Evaluation (Addendum)
Anesthesia Evaluation  Patient identified by MRN, date of birth, ID band Patient awake    Reviewed: Allergy & Precautions, NPO status , Patient's Chart, lab work & pertinent test results  Airway Mallampati: II  TM Distance: >3 FB Neck ROM: Full    Dental  (+) Teeth Intact, Dental Advisory Given   Pulmonary asthma ,  breath sounds clear to auscultation        Cardiovascular Rhythm:Regular Rate:Normal     Neuro/Psych    GI/Hepatic   Endo/Other  Morbid obesity  Renal/GU      Musculoskeletal   Abdominal   Peds  Hematology   Anesthesia Other Findings   Reproductive/Obstetrics                             Anesthesia Physical Anesthesia Plan  ASA: III  Anesthesia Plan: General   Post-op Pain Management:    Induction: Intravenous  Airway Management Planned: LMA  Additional Equipment:   Intra-op Plan:   Post-operative Plan: Extubation in OR  Informed Consent: I have reviewed the patients History and Physical, chart, labs and discussed the procedure including the risks, benefits and alternatives for the proposed anesthesia with the patient or authorized representative who has indicated his/her understanding and acceptance.   Dental advisory given  Plan Discussed with: CRNA, Anesthesiologist and Surgeon  Anesthesia Plan Comments:        Anesthesia Quick Evaluation

## 2014-08-06 NOTE — Interval H&P Note (Signed)
History and Physical Interval Note:  08/06/2014 11:13 AM  [name]  has presented today for surgery, with the diagnosis of left breast cancer  The various methods of treatment have been discussed with the patient and family.   Boyfriend and mother, Tamela Oddi, at bed side.  Seed in place by [employer].  After consideration of risks, benefits and other options for treatment, the patient has consented to  Procedure(s): LEFT BREAST LUMPECTOMY WITH RADIOACTIVE SEED AND LEFT AXILLARY SENTINEL LYMPH NODE BIOPSY (Left) as a surgical intervention .  The patient's history has been reviewed, patient examined, no change in status, stable for surgery.  I have reviewed the patient's chart and labs.  Questions were answered to the patient's satisfaction.     Amere Iott H

## 2014-08-06 NOTE — Progress Notes (Signed)
Assisted Dr. Al Corpus with left, ultrasound guided, pectoralis block. Side rails up, monitors on throughout procedure. See vital signs in flow sheet. Tolerated Procedure well. Also assisted Nuc med tech [phone] with nuc med inj. Tol procedure well

## 2014-08-07 NOTE — Addendum Note (Signed)
Addendum  created 08/07/14 1735 by Tawni Millers, CRNA   Modules edited: Charges VN

## 2014-08-10 ENCOUNTER — Encounter: Payer: Self-pay | Admitting: *Deleted

## 2014-08-10 ENCOUNTER — Telehealth: Payer: Self-pay | Admitting: Hematology and Oncology

## 2014-08-10 ENCOUNTER — Telehealth: Payer: Self-pay | Admitting: *Deleted

## 2014-08-10 NOTE — Telephone Encounter (Signed)
Received order for oncotype testing. Requisition sent to pathology. Received by Christy. 

## 2014-08-17 ENCOUNTER — Telehealth: Payer: Self-pay | Admitting: *Deleted

## 2014-08-17 ENCOUNTER — Encounter: Payer: Self-pay | Admitting: *Deleted

## 2014-08-17 NOTE — Progress Notes (Signed)
Received oncotype results of 7/6%.  Copy to medical records to scan and copy to Dr. Lindi Adie.

## 2014-08-17 NOTE — Telephone Encounter (Signed)
Received Oncotype Dx results of 7.  Placed a copy in Dr. Geralyn Flash box and took a copy to HIM to scan.

## 2014-08-18 ENCOUNTER — Encounter (HOSPITAL_COMMUNITY): Payer: Self-pay

## 2014-08-18 ENCOUNTER — Telehealth: Payer: Self-pay | Admitting: Genetic Counselor

## 2014-08-18 DIAGNOSIS — Z1379 Encounter for other screening for genetic and chromosomal anomalies: Secondary | ICD-10-CM

## 2014-08-18 NOTE — Telephone Encounter (Signed)
LM on VM with good news and to please call back. 

## 2014-08-19 NOTE — Progress Notes (Signed)
Location of Breast Cancer:Left upper-outer invasive ductal carcinoma 1.5x1.1x1.0 3 o'clock.  Histology per Pathology Report:08/06/2014 Diagnosis 1. Breast, lumpectomy, left - INVASIVE DUCTAL CARCINOMA, SEE COMMENT. - NEGATIVE FOR LYMPH VASCULAR INVASION. - INVASIVE TUMOR IS 1 CM FROM NEAREST MARGIN (SUPERIOR) - DUCTAL CARCINOMA IN SITU. - PREVIOUS BIOPSY SITE. - SEE TUMOR SYNOPTIC TEMPLATE BELOW. 2. Breast, excision, left breast mass superior margin - BENIGN BREAST TISSUE, SEE COMMENT. - NEGATIVE FOR ATYPIA OR MALIGNANCY. - MICROCALCIFICATIONS PRESENT. - SURGICAL MARGIN, NEGATIVE FOR ATYPIA OR MALIGNANCY. 3. Lymph node, sentinel, biopsy, left axillary #1 - ONE LYMPH NODE, NEGATIVE FOR TUMOR (0/1) 4. Lymph node, sentinel, biopsy, left axillary #2 - ONE LYMPH NODE, NEGATIVE FOR TUMOR (0/1) Microscopic Comment 1. BREAST, INVASIVE TUMOR, WITH LYMPH NODES PRESENT 1 of  Receptor Status: ER(+), PR (+), Her2-neu (-)  Patient presented with palpable mass.Mammographic images revealed spiculated mass on 07/09/14 An ultra-sound guided core biopsy was recommended/performed 07/14/14.  Past/Anticipated interventions by surgeon, if XNR:COIODG LUMPECTOMY WITH RADIOACTIVE SEED AND SENTINEL LYMPH NODE BIOPSY 08/06/14.  Past/Anticipated interventions by medical oncology, if any: no  Lymphedema issues, if any: Reports swelling in the left axilla  Pain issues, if any: 8-9 to lumpectomy line.  Reports pain is constantly present and is sharp.   SAFETY ISSUES:  Prior radiation? No  Pacemaker/ICD? No  Possible current pregnancy? No post-menopausal secondary to hysterectomy  Is the patient on methotrexate? No   Current Complaints / other details:Married. Menarche.First birth at 34.GXP3 No smoking history NKDA Family history of grandmother and aunt with breast cancer.     Arlyss Repress, RN 08/19/2014,11:22 AM

## 2014-08-20 ENCOUNTER — Telehealth: Payer: Self-pay | Admitting: Genetic Counselor

## 2014-08-20 ENCOUNTER — Ambulatory Visit
Admission: RE | Admit: 2014-08-20 | Discharge: 2014-08-20 | Disposition: A | Discharge status: Home / Self Care | Payer: PRIVATE HEALTH INSURANCE | Source: Ambulatory Visit | Attending: Radiation Oncology | Admitting: Radiation Oncology

## 2014-08-20 ENCOUNTER — Encounter: Payer: Self-pay | Admitting: Radiation Oncology

## 2014-08-20 VITALS — BP 108/54 | HR 62 | Temp 98.9°F | Resp 12 | Wt 231.0 lb

## 2014-08-20 DIAGNOSIS — C50412 Malignant neoplasm of upper-outer quadrant of left female breast: Secondary | ICD-10-CM

## 2014-08-20 DIAGNOSIS — L598 Other specified disorders of the skin and subcutaneous tissue related to radiation: Secondary | ICD-10-CM | POA: Insufficient documentation

## 2014-08-20 DIAGNOSIS — Z510 Encounter for antineoplastic radiation therapy: Secondary | ICD-10-CM | POA: Diagnosis present

## 2014-08-20 NOTE — Telephone Encounter (Signed)
LM on VM with good news and asked that she call back.

## 2014-08-20 NOTE — Progress Notes (Signed)
   Department of Radiation Oncology  Phone:  [phone] Fax:        [phone]   Name: [name] MRN: [id number]  DOB: [birth year]  Date: 08/20/2014  Follow Up Visit Note  Diagnosis: Breast cancer of upper-outer quadrant of left female breast   Staging form: Breast, AJCC 7th Edition     Clinical stage from 07/22/2014: Stage IA (T1c, N0, M0) - Unsigned  Interval History: [name] presents today for routine followup.  She had her surgery on 08/06/14 with negative margins and negative nodes. Her oncotype score came back low. She is seeing Dr. Lindi Adie and Dr. Lucia Gaskins next week. She is on Keflex for a wound infection. Her breast is still sore, especially at night. She took Aleve but it is not helping. She is working full time. She is accompanied by her husband.   Physical Exam:  Filed Vitals:   08/20/14 1541  BP: 108/54  Pulse: 62  Temp: 98.9 F (37.2 C)  TempSrc: Oral  Resp: 12  Weight: 231 lb (104.781 kg)  SpO2: 100%  Pleasant female in no distress. Some erythema around wound which is healing. No warmth or pus.   IMPRESSION: [name] is a 39 y.o. female s/p breast conservation with resolving acute effects of treatment.   PLAN:  I spoke to the patient today regarding her diagnosis and options for treatment. We discussed the equivalence in terms of survival and local failure between mastectomy and breast conservation. We discussed the role of radiation in decreasing local failures in patients who undergo lumpectomy. We discussed the process of simulation and the placement tattoos. We discussed 6 weeks of treatment as an outpatient. We discussed the possibility of asymptomatic lung damage. We discussed the low likelihood of secondary malignancies. We discussed the possible side effects including but not limited to skin redness, fatigue, permanent skin darkening, and breast swelling.   We discussed prone breast technique. I would like to bring her back in for simulation the 3rd  week of March to give her some more time to heal.    Thea Silversmith, MD

## 2014-08-20 NOTE — Telephone Encounter (Signed)
Revealed negative genetic testing on the Breast/Ovarian cancer panel through [facility].

## 2014-08-26 ENCOUNTER — Telehealth: Payer: Self-pay | Admitting: Hematology and Oncology

## 2014-08-26 ENCOUNTER — Ambulatory Visit (HOSPITAL_BASED_OUTPATIENT_CLINIC_OR_DEPARTMENT_OTHER): Payer: PRIVATE HEALTH INSURANCE | Admitting: Hematology and Oncology

## 2014-08-26 VITALS — BP 117/63 | HR 69 | Temp 98.6°F | Resp 18 | Ht 63.0 in | Wt 232.7 lb

## 2014-08-26 DIAGNOSIS — C50412 Malignant neoplasm of upper-outer quadrant of left female breast: Secondary | ICD-10-CM

## 2014-08-26 DIAGNOSIS — Z170 Estrogen receptor positive status [ER+]: Secondary | ICD-10-CM

## 2014-08-26 MED ORDER — ANASTROZOLE 1 MG PO TABS: 1.0000 mg | ORAL_TABLET | Freq: Every day | ORAL | Status: DC

## 2014-08-26 NOTE — Progress Notes (Signed)
Patient Care Team: Leonard Downing, MD as PCP - General (Family Medicine) Alphonsa Overall, MD as Consulting Physician (General Surgery) Rulon Eisenmenger, MD as Consulting Physician (Hematology and Oncology) Thea Silversmith, MD as Consulting Physician (Radiation Oncology) Roselee Culver, RN as Registered Nurse [facility], RN as Registered Nurse Trinda Pascal, NP as Nurse Practitioner (Nurse Practitioner)  DIAGNOSIS: Breast cancer of upper-outer quadrant of left female breast   Staging form: Breast, AJCC 7th Edition     Clinical stage from 07/22/2014: Stage IA (T1c, N0, M0) - Unsigned   SUMMARY OF ONCOLOGIC HISTORY:   Breast cancer of upper-outer quadrant of left female breast   07/14/2014 Initial Diagnosis Left breast 2:00: Invasive ductal carcinoma with DCIS, intramammary lymph node biopsy negative, grade 2, ER 100%, PR 96% Ki-67 17%, HER-2 negative ratio 1.17   07/14/2014 Mammogram Left breast 2:00 position 1.5 cm mass with 2 intramammary lymph nodes one of which has cortical thickening which was biopsied   08/06/2014 Surgery Left breast lumpectomy: Invasive ductal carcinoma with DCIS, no LVI, 2 sentinel nodes negative, 1.3 cm, grade 1, 100%, PR 96 wasn't, HER-2 negative Ki-67 17% Oncotype DX recurrence score 7, 6% ROR    CHIEF COMPLIANT: Follow-up after surgery to discuss Oncotype DX  INTERVAL HISTORY: [name] is a 39 year old lady with above-mentioned history of left-sided breast cancer treated with lumpectomy and is here today to discuss the final pathology report as well as Oncotype DX test result. She done very well from surgery and is recovering very well. She had seen radiation oncology and is planning to start radiation later this month.  REVIEW OF SYSTEMS:   Constitutional: Denies fevers, chills or abnormal weight loss Eyes: Denies blurriness of vision Ears, nose, mouth, throat, and face: Denies mucositis or sore throat Respiratory: Denies  cough, dyspnea or wheezes Cardiovascular: Denies palpitation, chest discomfort or lower extremity swelling Gastrointestinal:  Denies nausea, heartburn or change in bowel habits Skin: Denies abnormal skin rashes Lymphatics: Denies new lymphadenopathy or easy bruising Neurological:Denies numbness, tingling or new weaknesses Behavioral/Psych: Mood is stable, no new changes  Breast:  denies any pain or lumps or nodules in either breasts All other systems were reviewed with the patient and are negative.  I have reviewed the past medical history, past surgical history, social history and family history with the patient and they are unchanged from previous note.  ALLERGIES:  has No Known Allergies.  MEDICATIONS:  Current Outpatient Prescriptions  Medication Sig Dispense Refill  . anastrozole (ARIMIDEX) 1 MG tablet Take 1 tablet (1 mg total) by mouth daily. 90 tablet 3  . HYDROcodone-acetaminophen (NORCO/VICODIN) 5-325 MG per tablet Take 1-2 tablets by mouth every 6 (six) hours as needed for moderate pain. 30 tablet 0   No current facility-administered medications for this visit.    PHYSICAL EXAMINATION: ECOG PERFORMANCE STATUS: 0 - Asymptomatic  Filed Vitals:   08/26/14 1304  BP: 117/63  Pulse: 69  Temp: 98.6 F (37 C)  Resp: 18   Filed Weights   08/26/14 1304  Weight: 232 lb 11.2 oz (105.552 kg)    GENERAL:alert, no distress and comfortable SKIN: skin color, texture, turgor are normal, no rashes or significant lesions EYES: normal, Conjunctiva are pink and non-injected, sclera clear OROPHARYNX:no exudate, no erythema and lips, buccal mucosa, and tongue normal  NECK: supple, thyroid normal size, non-tender, without nodularity LYMPH:  no palpable lymphadenopathy in the cervical, axillary or inguinal LUNGS: clear to auscultation and percussion with normal breathing effort  HEART: regular rate & rhythm and no murmurs and no lower extremity edema ABDOMEN:abdomen soft, non-tender  and normal bowel sounds Musculoskeletal:no cyanosis of digits and no clubbing  NEURO: alert & oriented x 3 with fluent speech, no focal motor/sensory deficits  LABORATORY DATA:  I have reviewed the data as listed   Chemistry      Component Value Date/Time   NA 141 07/22/2014 0824   NA 135 03/25/2013 2308   K 4.0 07/22/2014 0824   K 3.6 03/25/2013 2308   CL 100 03/25/2013 2308   CO2 26 07/22/2014 0824   CO2 26 03/25/2013 2308   BUN 14.4 07/22/2014 0824   BUN 10 03/25/2013 2308   CREATININE 0.9 07/22/2014 0824   CREATININE 0.74 03/25/2013 2308      Component Value Date/Time   CALCIUM 9.5 07/22/2014 0824   CALCIUM 9.1 03/25/2013 2308   ALKPHOS 78 07/22/2014 0824   ALKPHOS 89 03/25/2013 2308   AST 19 07/22/2014 0824   AST 17 03/25/2013 2308   ALT 24 07/22/2014 0824   ALT 15 03/25/2013 2308   BILITOT 0.55 07/22/2014 0824   BILITOT 0.2* 03/25/2013 2308       Lab Results  Component Value Date   WBC 4.6 07/22/2014   HGB 14.1 08/06/2014   HCT 41.0 07/22/2014   MCV 81.6 07/22/2014   PLT 292 07/22/2014   NEUTROABS 3.2 07/22/2014     ASSESSMENT & PLAN:  Breast cancer of upper-outer quadrant of left female breast Left breast lumpectomy: Invasive ductal carcinoma with DCIS, no LVI, 2 sentinel nodes negative, 1.3 cm, grade 1, 100%, PR 96 wasn't, HER-2 negative Ki-67 17% T1 cN0 M0 stage IA Oncotype DX recurrence score is 7, 6% ROR  Pathology review: I discussed the final pathology report and provided her with a copy of this report. I also discussed Oncotype score of 7 and the risk of recurrence being at 6%. There is no indication for systemic chemotherapy.  Recommendation: 1. Adjuvant radiation therapy followed by 2. Antiestrogen therapy with anastrozole 1 mg daily 5 years (patient had hysterectomy with bilateral salpingo-oophorectomy in 2011)  Anastrozole counseling:We discussed the risks and benefits of anti-estrogen therapy with aromatase inhibitors. These include but not  limited to insomnia, hot flashes, mood changes, vaginal dryness, bone density loss, and weight gain. Although rare, serious side effects including endometrial cancer, risk of blood clots were also discussed. We strongly believe that the benefits far outweigh the risks. Patient understands these risks and consented to starting treatment. Planned treatment duration is 5 years.  I said developed chronic prescription for anastrozole that will be started 2 weeks after completion of radiation therapy. I would like to see her back one month after starting anastrozole therapy to go over side effects and toxicities.  No orders of the defined types were placed in this encounter.   The patient has a good understanding of the overall plan. she agrees with it. She will call with any problems that may develop before her next visit here.   Rulon Eisenmenger, MD

## 2014-08-26 NOTE — Assessment & Plan Note (Signed)
Left breast lumpectomy: Invasive ductal carcinoma with DCIS, no LVI, 2 sentinel nodes negative, 1.3 cm, grade 1, 100%, PR 96 wasn't, HER-2 negative Ki-67 17% T1 cN0 M0 stage IA Oncotype DX recurrence score is 7, 6% ROR  Pathology review: I discussed the final pathology report and provided her with a copy of this report. I also discussed Oncotype score of 7 and the risk of recurrence being at 6%. There is no indication for systemic chemotherapy.  Recommendation: 1. Adjuvant radiation therapy followed by 2. Antiestrogen therapy with tamoxifen 20 mg daily 10 years  Tamoxifen counseling:We discussed the risks and benefits of tamoxifen. These include but not limited to insomnia, hot flashes, mood changes, vaginal dryness, and weight gain. Although rare, serious side effects including endometrial cancer, risk of blood clots were also discussed. We strongly believe that the benefits far outweigh the risks. Patient understands these risks and consented to starting treatment. Planned treatment duration is 10 years.  I provided her with a prescription for tamoxifen that will be started 2 weeks after completion of radiation therapy. I would like to see her back one month after starting tamoxifen therapy to go over side effects and toxicities.

## 2014-08-26 NOTE — Telephone Encounter (Signed)
per pof to sch pt appt-gave pt copy of sch °

## 2014-08-26 NOTE — Addendum Note (Signed)
Addended by: Prentiss Bells on: 08/26/2014 02:41 PM   Modules accepted: Medications

## 2014-08-28 ENCOUNTER — Encounter: Payer: Self-pay | Admitting: *Deleted

## 2014-08-28 NOTE — Progress Notes (Signed)
[place] Work  Clinical Social Work received return call from pt. CSW reviewed resources of Pt and Family Support Team, Breast Cancer Support Group and other sources of support. Pt may attend group and is open to an [employer] referral, which CSW completed today. CSW discussed counseling support as well, as pt states her depression has been an issue since her diagnosis. She denies using medication for this in the past or attending counseling. CSW discussed common emotions experienced and ways to cope. She is open to the resources of the [facility] to assist in her coping during her treatment. She agrees to call or come see CSW as needed.   Clinical Social Work interventions: Resource education  Supportive listening  Loren Racer, [place] Worker [place]  [place] Phone: [phone] Fax: [phone]

## 2014-08-28 NOTE — Progress Notes (Signed)
[place] Psychosocial Distress Screening Clinical Social Work  Clinical Social Work was referred by distress screening protocol.  The patient scored a 9 on the Psychosocial Distress Thermometer which indicates severe distress. Clinical Social Worker phoned pt to assess for distress and other psychosocial needs as her next appt was not until 09/10/14. CSW left vm and will attempt to see at [facility] appt.   ONCBCN DISTRESS SCREENING 08/20/2014  Screening Type Initial Screening  Distress experienced in past week (1-10) 9  Practical problem type Work/school  Emotional problem type Depression;Nervousness/Anxiety;Adjusting to illness;Isolation/feeling alone;Feeling hopeless;Boredom;Adjusting to appearance changes  Spiritual/Religous concerns type Facing my mortality  Physical Problem type Pain;Nausea/vomiting;Sleep/insomnia;Loss of appetitie  Physician notified of physical symptoms Yes  Referral to clinical psychology Yes   Clinical Social Worker follow up needed: Yes.    If yes, follow up plan: See above Loren Racer, [place] Worker [place]  [facility] Phone: [phone] Fax: [phone]

## 2014-09-04 ENCOUNTER — Encounter: Payer: Self-pay | Admitting: Hematology and Oncology

## 2014-09-08 ENCOUNTER — Ambulatory Visit
Admission: RE | Admit: 2014-09-08 | Discharge: 2014-09-08 | Disposition: A | Discharge status: Home / Self Care | Payer: PRIVATE HEALTH INSURANCE | Source: Ambulatory Visit | Attending: Radiation Oncology | Admitting: Radiation Oncology

## 2014-09-08 DIAGNOSIS — Z510 Encounter for antineoplastic radiation therapy: Secondary | ICD-10-CM | POA: Diagnosis not present

## 2014-09-08 DIAGNOSIS — C50412 Malignant neoplasm of upper-outer quadrant of left female breast: Secondary | ICD-10-CM

## 2014-09-08 NOTE — Progress Notes (Addendum)
Name: [name]   ZOX:.[id number]  Date:  09/08/2014  DOB: 04/11/1976  Status:outpatient    DIAGNOSIS: Breast cancer.  CONSENT VERIFIED: yes   SET UP: Patient is setup prone   IMMOBILIZATION:  The following immobilization was used:prone breast board.   NARRATIVE:The patient was brought to the [place].  Identity was confirmed.  All relevant records and images related to the planned course of therapy were reviewed.  Then, the patient was positioned in a stable reproducible clinical set-up in the prone position.  Skin folds were minimized and we confirmed that the contralateral breast was out of the exit field. A wire was placed on the scar.  CT images were obtained.  an isocenter was placed in the center of the breast. Skin markings were placed.  The CT images were loaded into the planning software where the target and avoidance structures were contoured.  The radiation prescription was entered and confirmed. The patient was discharged in stable condition and tolerated simulation well.    TREATMENT PLANNING NOTE:  Treatment planning then occurred. I have requested : MLC's, isodose plan, basic dose calculation  I personally oversaw and approved the construction of 4 medically necessary complex treatment devices including 4 unique MLCS used for beam modification purposes.    3D simulation was requested and performed with a dose volume histogram of the heart, lungs and lumpectomy cavity.

## 2014-09-15 ENCOUNTER — Ambulatory Visit
Admission: RE | Admit: 2014-09-15 | Discharge: 2014-09-15 | Disposition: A | Discharge status: Home / Self Care | Payer: PRIVATE HEALTH INSURANCE | Source: Ambulatory Visit | Attending: Radiation Oncology | Admitting: Radiation Oncology

## 2014-09-15 ENCOUNTER — Encounter: Payer: Self-pay | Admitting: Radiation Oncology

## 2014-09-15 DIAGNOSIS — Z510 Encounter for antineoplastic radiation therapy: Secondary | ICD-10-CM | POA: Diagnosis not present

## 2014-09-15 DIAGNOSIS — C50412 Malignant neoplasm of upper-outer quadrant of left female breast: Secondary | ICD-10-CM

## 2014-09-15 MED ORDER — RADIAPLEXRX EX GEL
Freq: Once | CUTANEOUS | Status: AC
  Administered 2014-09-15: 09:00:00 via TOPICAL

## 2014-09-15 MED ORDER — ALRA NON-METALLIC DEODORANT (RAD-ONC)
1.0000 "application " | Freq: Once | TOPICAL | Status: AC
  Administered 2014-09-15: 1 via TOPICAL

## 2014-09-16 ENCOUNTER — Ambulatory Visit
Admission: RE | Admit: 2014-09-16 | Discharge: 2014-09-16 | Disposition: A | Discharge status: Home / Self Care | Payer: PRIVATE HEALTH INSURANCE | Source: Ambulatory Visit | Attending: Radiation Oncology | Admitting: Radiation Oncology

## 2014-09-16 DIAGNOSIS — Z510 Encounter for antineoplastic radiation therapy: Secondary | ICD-10-CM | POA: Diagnosis not present

## 2014-09-17 ENCOUNTER — Ambulatory Visit
Admission: RE | Admit: 2014-09-17 | Discharge: 2014-09-17 | Disposition: A | Discharge status: Home / Self Care | Payer: PRIVATE HEALTH INSURANCE | Source: Ambulatory Visit | Attending: Radiation Oncology | Admitting: Radiation Oncology

## 2014-09-17 DIAGNOSIS — Z510 Encounter for antineoplastic radiation therapy: Secondary | ICD-10-CM | POA: Diagnosis not present

## 2014-09-18 ENCOUNTER — Ambulatory Visit
Admission: RE | Admit: 2014-09-18 | Discharge: 2014-09-18 | Disposition: A | Discharge status: Home / Self Care | Payer: PRIVATE HEALTH INSURANCE | Source: Ambulatory Visit | Attending: Radiation Oncology | Admitting: Radiation Oncology

## 2014-09-18 DIAGNOSIS — Z510 Encounter for antineoplastic radiation therapy: Secondary | ICD-10-CM | POA: Diagnosis not present

## 2014-09-18 NOTE — Progress Notes (Signed)
Pt here for patient teaching.  Pt given Radiation and You booklet, skin care instructions, Alra deodorant and Radiaplex gel. Reviewed areas of pertinence such as fatigue, hair loss, skin changes, breast tenderness, breast swelling, shortness of breath, earaches and taste changes . Pt able to give teach back of to pat skin, use unscented/gentle soap and drink plenty of water,apply Radiaplex bid, avoid applying anything to skin within 4 hours of treatment, avoid wearing an under wire bra and to use an electric razor if they must shave. Pt demonstrated understanding, needs reinforcement and verbalizes understanding of information given and will contact nursing with any questions or concerns.

## 2014-09-21 ENCOUNTER — Ambulatory Visit
Admission: RE | Admit: 2014-09-21 | Discharge: 2014-09-21 | Disposition: A | Discharge status: Home / Self Care | Payer: PRIVATE HEALTH INSURANCE | Source: Ambulatory Visit | Attending: Radiation Oncology | Admitting: Radiation Oncology

## 2014-09-21 DIAGNOSIS — Z510 Encounter for antineoplastic radiation therapy: Secondary | ICD-10-CM | POA: Diagnosis not present

## 2014-09-22 ENCOUNTER — Ambulatory Visit
Admission: RE | Admit: 2014-09-22 | Discharge: 2014-09-22 | Disposition: A | Discharge status: Home / Self Care | Payer: PRIVATE HEALTH INSURANCE | Source: Ambulatory Visit | Attending: Radiation Oncology | Admitting: Radiation Oncology

## 2014-09-22 ENCOUNTER — Ambulatory Visit: Payer: PRIVATE HEALTH INSURANCE | Admitting: Radiation Oncology

## 2014-09-22 VITALS — BP 118/60 | HR 71 | Temp 97.6°F | Wt 230.5 lb

## 2014-09-22 DIAGNOSIS — C50412 Malignant neoplasm of upper-outer quadrant of left female breast: Secondary | ICD-10-CM

## 2014-09-22 DIAGNOSIS — Z510 Encounter for antineoplastic radiation therapy: Secondary | ICD-10-CM | POA: Diagnosis not present

## 2014-09-22 NOTE — Progress Notes (Signed)
Weekly assessment of radiation to left breast.Completed 5 treatments.Skin is pink with development of irritated follicles.Has shooting pain which I informed is normal.Does have fatigue.

## 2014-09-22 NOTE — Progress Notes (Signed)
Weekly Management Note Current Dose: 9  Gy  Projected Dose: 60.4 Gy   Narrative:  The patient presents for routine under treatment assessment.  CBCT/MVCT images/Port film x-rays were reviewed.  The chart was checked. Doing well. Complains of discomfort in ribs on prone board. Alo LE edema  Physical Findings: Weight: 230 lb 8 oz (104.554 kg). Unchanged  Impression:  The patient is tolerating radiation.  Plan:  Continue treatment as planned. Take Aleve prior to RT. Elevation for legs.

## 2014-09-23 ENCOUNTER — Ambulatory Visit
Admission: RE | Admit: 2014-09-23 | Discharge: 2014-09-23 | Disposition: A | Discharge status: Home / Self Care | Payer: PRIVATE HEALTH INSURANCE | Source: Ambulatory Visit | Attending: Radiation Oncology | Admitting: Radiation Oncology

## 2014-09-23 ENCOUNTER — Encounter: Payer: Self-pay | Admitting: *Deleted

## 2014-09-23 DIAGNOSIS — Z510 Encounter for antineoplastic radiation therapy: Secondary | ICD-10-CM | POA: Diagnosis not present

## 2014-09-23 NOTE — Progress Notes (Signed)
Late entry- Met with pt on 09/22/14 during weekly check with Dr. Pablo Ledger. Pt denies needs at this time. Encourage pt to call with questions or concerns. Received verbal understanding.

## 2014-09-24 ENCOUNTER — Ambulatory Visit
Admission: RE | Admit: 2014-09-24 | Discharge: 2014-09-24 | Disposition: A | Discharge status: Home / Self Care | Payer: PRIVATE HEALTH INSURANCE | Source: Ambulatory Visit | Attending: Radiation Oncology | Admitting: Radiation Oncology

## 2014-09-24 DIAGNOSIS — Z510 Encounter for antineoplastic radiation therapy: Secondary | ICD-10-CM | POA: Diagnosis not present

## 2014-09-25 ENCOUNTER — Ambulatory Visit: Payer: PRIVATE HEALTH INSURANCE

## 2014-09-25 ENCOUNTER — Ambulatory Visit
Admission: RE | Admit: 2014-09-25 | Discharge: 2014-09-25 | Disposition: A | Discharge status: Home / Self Care | Payer: PRIVATE HEALTH INSURANCE | Source: Ambulatory Visit | Attending: Radiation Oncology | Admitting: Radiation Oncology

## 2014-09-28 ENCOUNTER — Ambulatory Visit
Admission: RE | Admit: 2014-09-28 | Discharge: 2014-09-28 | Disposition: A | Discharge status: Home / Self Care | Payer: PRIVATE HEALTH INSURANCE | Source: Ambulatory Visit | Attending: Radiation Oncology | Admitting: Radiation Oncology

## 2014-09-28 DIAGNOSIS — Z510 Encounter for antineoplastic radiation therapy: Secondary | ICD-10-CM | POA: Diagnosis not present

## 2014-09-29 ENCOUNTER — Ambulatory Visit
Admission: RE | Admit: 2014-09-29 | Discharge: 2014-09-29 | Disposition: A | Discharge status: Home / Self Care | Payer: PRIVATE HEALTH INSURANCE | Source: Ambulatory Visit | Attending: Radiation Oncology | Admitting: Radiation Oncology

## 2014-09-29 ENCOUNTER — Encounter: Payer: Self-pay | Admitting: Radiation Oncology

## 2014-09-29 VITALS — BP 112/73 | HR 65 | Temp 98.0°F | Resp 16 | Wt 235.0 lb

## 2014-09-29 DIAGNOSIS — C50412 Malignant neoplasm of upper-outer quadrant of left female breast: Secondary | ICD-10-CM

## 2014-09-29 DIAGNOSIS — Z510 Encounter for antineoplastic radiation therapy: Secondary | ICD-10-CM | POA: Diagnosis not present

## 2014-09-29 NOTE — Progress Notes (Signed)
Patient reports that her breast seems darker at the mammary fold. Denies nipple discharge. Reports intermittent sharp shooting pains in her right breast that resolve quickly. Patient understands this is related to nerves healing from surgery. Reports using radiaplex and alra as directed. Reports fatigue. Encouraged 30 minutes of exercise each day, prioritizing activities to allow time for rest and eating a healthy well balanced diet to fight fatigue. Patient verbalized understanding.

## 2014-09-29 NOTE — Progress Notes (Signed)
Weekly Management Note Current Dose: 16.2  Gy  Projected Dose: 60.4 Gy   Narrative:  The patient presents for routine under treatment assessment.  CBCT/MVCT images/Port film x-rays were reviewed.  The chart was checked. Doing well. Nausea and fatigue. Some irritation under breast.   Physical Findings: Weight: 235 lb (106.595 kg). Rash under left breast ? Plaque like fungal dermatitis.   Impression:  The patient is tolerating radiation.  Plan:  Continue treatment as planned. Start PPI. Nystatin powder for breast.

## 2014-09-30 ENCOUNTER — Ambulatory Visit
Admission: RE | Admit: 2014-09-30 | Discharge: 2014-09-30 | Disposition: A | Discharge status: Home / Self Care | Payer: PRIVATE HEALTH INSURANCE | Source: Ambulatory Visit | Attending: Radiation Oncology | Admitting: Radiation Oncology

## 2014-09-30 DIAGNOSIS — Z510 Encounter for antineoplastic radiation therapy: Secondary | ICD-10-CM | POA: Diagnosis not present

## 2014-10-01 ENCOUNTER — Other Ambulatory Visit: Payer: Self-pay | Admitting: Radiation Oncology

## 2014-10-01 ENCOUNTER — Ambulatory Visit
Admission: RE | Admit: 2014-10-01 | Discharge: 2014-10-01 | Disposition: A | Discharge status: Home / Self Care | Payer: PRIVATE HEALTH INSURANCE | Source: Ambulatory Visit | Attending: Radiation Oncology | Admitting: Radiation Oncology

## 2014-10-01 DIAGNOSIS — Z510 Encounter for antineoplastic radiation therapy: Secondary | ICD-10-CM | POA: Diagnosis not present

## 2014-10-01 MED ORDER — PROCHLORPERAZINE MALEATE 10 MG PO TABS: 10.0000 mg | ORAL_TABLET | Freq: Four times a day (QID) | ORAL | Status: DC | PRN

## 2014-10-01 NOTE — Progress Notes (Signed)
Patient brought to nursing stating she doesn't feel good.states she has been having nausea since last evening."Every time I eat I get nauseated." We are treating the left breast.Skin there looks better since starting miconazole powder.I spoke with Dr.Kinard and he has sent an electronic script for compazine to [facility].Instructed patient on use.Eat bland diet, drink water and gingerale and may come to nursing tomorrow if no better.

## 2014-10-02 ENCOUNTER — Ambulatory Visit: Payer: PRIVATE HEALTH INSURANCE

## 2014-10-02 ENCOUNTER — Telehealth: Payer: Self-pay | Admitting: Hematology and Oncology

## 2014-10-02 NOTE — Telephone Encounter (Signed)
Called and left a message with a new appointment due to dr Lindi Adie  Call day

## 2014-10-05 ENCOUNTER — Emergency Department (HOSPITAL_COMMUNITY)
Admission: EM | Admit: 2014-10-05 | Discharge: 2014-10-06 | Disposition: A | Discharge status: Home / Self Care | Payer: PRIVATE HEALTH INSURANCE | Attending: Emergency Medicine | Admitting: Emergency Medicine

## 2014-10-05 ENCOUNTER — Encounter (HOSPITAL_COMMUNITY): Payer: Self-pay | Admitting: Emergency Medicine

## 2014-10-05 ENCOUNTER — Ambulatory Visit
Admission: RE | Admit: 2014-10-05 | Discharge: 2014-10-05 | Disposition: A | Discharge status: Home / Self Care | Payer: PRIVATE HEALTH INSURANCE | Source: Ambulatory Visit | Attending: Radiation Oncology | Admitting: Radiation Oncology

## 2014-10-05 DIAGNOSIS — Y9289 Other specified places as the place of occurrence of the external cause: Secondary | ICD-10-CM | POA: Insufficient documentation

## 2014-10-05 DIAGNOSIS — T2101XA Burn of unspecified degree of chest wall, initial encounter: Secondary | ICD-10-CM | POA: Diagnosis present

## 2014-10-05 DIAGNOSIS — R011 Cardiac murmur, unspecified: Secondary | ICD-10-CM | POA: Diagnosis not present

## 2014-10-05 DIAGNOSIS — W888XXA Exposure to other ionizing radiation, initial encounter: Secondary | ICD-10-CM | POA: Diagnosis not present

## 2014-10-05 DIAGNOSIS — J45909 Unspecified asthma, uncomplicated: Secondary | ICD-10-CM | POA: Diagnosis not present

## 2014-10-05 DIAGNOSIS — T2121XA Burn of second degree of chest wall, initial encounter: Secondary | ICD-10-CM | POA: Diagnosis not present

## 2014-10-05 DIAGNOSIS — Z79899 Other long term (current) drug therapy: Secondary | ICD-10-CM | POA: Diagnosis not present

## 2014-10-05 DIAGNOSIS — Y998 Other external cause status: Secondary | ICD-10-CM | POA: Insufficient documentation

## 2014-10-05 DIAGNOSIS — E669 Obesity, unspecified: Secondary | ICD-10-CM | POA: Insufficient documentation

## 2014-10-05 DIAGNOSIS — Y9389 Activity, other specified: Secondary | ICD-10-CM | POA: Diagnosis not present

## 2014-10-05 DIAGNOSIS — Z510 Encounter for antineoplastic radiation therapy: Secondary | ICD-10-CM | POA: Diagnosis not present

## 2014-10-05 DIAGNOSIS — C50412 Malignant neoplasm of upper-outer quadrant of left female breast: Secondary | ICD-10-CM | POA: Diagnosis not present

## 2014-10-05 DIAGNOSIS — T300 Burn of unspecified body region, unspecified degree: Secondary | ICD-10-CM

## 2014-10-05 MED ORDER — BACITRACIN ZINC 500 UNIT/GM EX OINT: 1.0000 "application " | TOPICAL_OINTMENT | Freq: Two times a day (BID) | CUTANEOUS | Status: DC

## 2014-10-05 MED ORDER — BACITRACIN ZINC 500 UNIT/GM EX OINT
1.0000 "application " | TOPICAL_OINTMENT | Freq: Two times a day (BID) | CUTANEOUS | Status: DC
  Administered 2014-10-05: 1 via TOPICAL

## 2014-10-05 MED ORDER — HYDROCODONE-ACETAMINOPHEN 5-325 MG PO TABS
2.0000 | ORAL_TABLET | Freq: Once | ORAL | Status: AC
Start: 2014-10-06 — End: 2014-10-05
  Administered 2014-10-05: 2 via ORAL
  Filled 2014-10-05: qty 2

## 2014-10-05 MED ORDER — HYDROCODONE-ACETAMINOPHEN 5-325 MG PO TABS: 1.0000 | ORAL_TABLET | Freq: Four times a day (QID) | ORAL | Status: DC | PRN

## 2014-10-05 NOTE — ED Provider Notes (Signed)
CSN: [id number]     Arrival date & time 10/05/14  2213 History  This chart was scribed for non-physician practitioner, Antonietta Breach, PA-C,working with Evelina Bucy, MD, by Marlowe Kays, ED Scribe. This patient was seen in room WTR5/WTR5 and the patient's care was started at 11:38 PM.  Chief Complaint  Patient presents with  . Radiation Burn   The history is provided by the patient and medical records. No language interpreter was used.    HPI Comments:  [name] is a 39 y.o. obese female with PMHx of left breast cancer who presents to the Emergency Department complaining of a radiation burn inferior to the left breast that has been worsening for the last three days. She reports associated redness of the area for the past three days but since her last treatment today approximately 9 hours ago the pain and redness has worsened. She states her oncologist is aware of the redness and is scheduled to have another treatment tomorrow. She reports associated drainage from the area. She has placed a cool, damp cloth under to the area with mild relief. She also has been using antifungal powder (for three days) and a gel for burns (twice daily daily for the past three weeks since the beginning of the treatment) given to her by her oncologist. Touching the area makes the pain worse. Denies alleviating factors. Denies fever, chills, nausea or vomiting. PMHx of seizures and asthma.   Past Medical History  Diagnosis Date  . History of seizures     during pregnancies; last seizure 3 yrs. ago  . Stress headaches   . Asthma     no current med.  . Breast cancer of upper-outer quadrant of left female breast 07/16/2014  . Heart murmur     as a child   Past Surgical History  Procedure Laterality Date  . Cesarean section  [id number]  . Tympanostomy tube placement    . Total abdominal hysterectomy w/ bilateral salpingoophorectomy  12/20/2009  . Hysteroscopy w/ endometrial ablation  11/11/2007  . Lysis of  adhesion  12/20/2009  . Cysto  12/20/2009  . Cesarean section with bilateral tubal ligation  03/03/2003   Family History  Problem Relation Age of Onset  . Hypertension Mother   . Breast cancer Other     MGM's sister  . Breast cancer Maternal Grandmother     dx in her 67s   History  Substance Use Topics  . Smoking status: Never Smoker   . Smokeless tobacco: Never Used  . Alcohol Use: No   OB History    No data available      Review of Systems  Constitutional: Negative for fever and chills.  Gastrointestinal: Negative for nausea and vomiting.  Skin: Positive for color change and wound.  All other systems reviewed and are negative.   Allergies  Review of patient's allergies indicates no known allergies.  Home Medications   Prior to Admission medications   Medication Sig Start Date End Date Taking? Authorizing Provider  anastrozole (ARIMIDEX) 1 MG tablet Take 1 tablet (1 mg total) by mouth daily. Patient not taking: Reported on 09/22/2014 08/26/14   Nicholas Lose, MD  bacitracin ointment Apply 1 application topically 2 (two) times daily. 10/05/14   Antonietta Breach, PA-C  HYDROcodone-acetaminophen (NORCO/VICODIN) 5-325 MG per tablet Take 1-2 tablets by mouth every 6 (six) hours as needed for moderate pain or severe pain. 10/05/14   Antonietta Breach, PA-C  non-metallic deodorant Jethro Poling) MISC Apply 1 application topically daily  as needed.    Historical Provider, MD  prochlorperazine (COMPAZINE) 10 MG tablet Take 1 tablet (10 mg total) by mouth every 6 (six) hours as needed for nausea or vomiting. 10/01/14   Gery Pray, MD  Wound Dressings (RADIAGEL EX) Apply 90 g topically.    Historical Provider, MD   Triage Vitals: BP 114/49 mmHg  Pulse 72  Temp(Src) 97.7 F (36.5 C) (Oral)  Resp 16  SpO2 100%  Physical Exam  Constitutional: She is oriented to person, place, and time. She appears well-developed and well-nourished. No distress.  HENT:  Head: Normocephalic and atraumatic.  Eyes:  Conjunctivae and EOM are normal. No scleral icterus.  Neck: Normal range of motion.  Cardiovascular: Normal rate, regular rhythm and intact distal pulses.   Pulmonary/Chest: Effort normal. No respiratory distress. Left breast exhibits skin change and tenderness. Left breast exhibits no inverted nipple and no nipple discharge.    Respirations even and unlabored  Musculoskeletal: Normal range of motion.  Neurological: She is alert and oriented to person, place, and time. She exhibits normal muscle tone. Coordination normal.  GCS 15. Patient moving all extremities. She ambulates with steady gait.  Skin: Skin is warm and dry. No rash noted. She is not diaphoretic. There is erythema. No pallor.  Psychiatric: She has a normal mood and affect. Her behavior is normal.  Nursing note and vitals reviewed.   ED Course  Procedures (including critical care time) DIAGNOSTIC STUDIES: Oxygen Saturation is 100% on RA, normal by my interpretation.   COORDINATION OF CARE: 11:48 PM- Will prescribe pain medication. Will apply Bacitracin ointment and dress wound. Advised pt to speak with oncologist about further pain control. Will give a dose of pain medication prior to discharge. Pt verbalizes understanding and agrees to plan.  Medications  bacitracin ointment 1 application (1 application Topical Given 10/05/14 2355)  HYDROcodone-acetaminophen (NORCO/VICODIN) 5-325 MG per tablet 2 tablet (2 tablets Oral Given 10/05/14 2354)    Labs Review Labs Reviewed - No data to display  Imaging Review No results found.   EKG Interpretation None      MDM   Final diagnoses:  Radiation burn    39 year old female presents to the emergency department for further evaluation of a superficial partial-thickness burn under her left breast secondary to radiation for breast cancer. No evidence of secondary infection. Have discussed burn care with the patient as well as continuation with topical antifungal. Tdap updated  07/02/14. Short course of Norco given for pain control. Patient to see oncologist tomorrow for recheck of symptoms. Return precautions given. Patient agreeable to plan with no unaddressed concerns.  I personally performed the services described in this documentation, which was scribed in my presence. The recorded information has been reviewed and is accurate.   Filed Vitals:   10/05/14 2223  BP: 114/49  Pulse: 72  Temp: 97.7 F (36.5 C)  TempSrc: Oral  Resp: 16  SpO2: 100%     Antonietta Breach, PA-C 10/06/14 0040  Evelina Bucy, MD 10/07/14 [phone]

## 2014-10-05 NOTE — ED Notes (Signed)
Pt c/o radiation burns after her third week of radiation for breast cancer. Pt had radiation this morning and since the pt sts the pain from the burn is unbearable. Pt sts the powder she was given to help with pain does not help. Pt has reddened area under L breast. No open wounds or weeping noted. Pt A&Ox4 and ambulatory. No other complaints at this time. Pt sts she is here for pain control and some help with the burning.

## 2014-10-05 NOTE — Discharge Instructions (Signed)
Apply bacitracin or your burn gel at least twice a day. Apply your antifungal powder after the bacitracin/gel has almost completely absorbed into the skin. You may take Norco as needed for severe pain. Follow up with your doctor this week for symptom recheck.  Burn Care Your skin is a natural barrier to infection. It is the largest organ of your body. Burns damage this natural protection. To help prevent infection, it is very important to follow your caregiver's instructions in the care of your burn. Burns are classified as:  First degree. There is only redness of the skin (erythema). No scarring is expected.  Second degree. There is blistering of the skin. Scarring may occur with deeper burns.  Third degree. All layers of the skin are injured, and scarring is expected. HOME CARE INSTRUCTIONS   Wash your hands well before changing your bandage.  Change your bandage as often as directed by your caregiver.  Remove the old bandage. If the bandage sticks, you may soak it off with cool, clean water.  Cleanse the burn thoroughly but gently with mild soap and water.  Pat the area dry with a clean, dry cloth.  Apply a thin layer of antibacterial cream to the burn.  Apply a clean bandage as instructed by your caregiver.  Keep the bandage as clean and dry as possible.  Elevate the affected area for the first 24 hours, then as instructed by your caregiver.  Only take over-the-counter or prescription medicines for pain, discomfort, or fever as directed by your caregiver. SEEK IMMEDIATE MEDICAL CARE IF:   You develop excessive pain.  You develop redness, tenderness, swelling, or red streaks near the burn.  The burned area develops yellowish-white fluid (pus) or a bad smell.  You have a fever. MAKE SURE YOU:   Understand these instructions.  Will watch your condition.  Will get help right away if you are not doing well or get worse. Document Released: 06/12/2005 Document Revised:  09/04/2011 Document Reviewed: 11/02/2010 [facility] Patient Information 2015 [place], [place]. This information is not intended to replace advice given to you by your health care provider. Make sure you discuss any questions you have with your health care provider.

## 2014-10-05 NOTE — ED Notes (Signed)
Pt reports having a radiation tx today at 1430.  Reports that she has been having txs for breast cancer.  Reports her oncologist is aware that the area below her L breast is red.  But pt is c/o severe pain and blisters.

## 2014-10-06 ENCOUNTER — Ambulatory Visit
Admission: RE | Admit: 2014-10-06 | Discharge: 2014-10-06 | Disposition: A | Discharge status: Home / Self Care | Payer: PRIVATE HEALTH INSURANCE | Source: Ambulatory Visit | Attending: Radiation Oncology | Admitting: Radiation Oncology

## 2014-10-06 ENCOUNTER — Telehealth: Payer: Self-pay

## 2014-10-06 ENCOUNTER — Encounter: Payer: Self-pay | Admitting: Radiation Oncology

## 2014-10-06 ENCOUNTER — Ambulatory Visit: Admission: RE | Admit: 2014-10-06 | Payer: PRIVATE HEALTH INSURANCE | Source: Ambulatory Visit

## 2014-10-06 VITALS — BP 120/62 | HR 67 | Resp 16 | Wt 233.2 lb

## 2014-10-06 DIAGNOSIS — C50412 Malignant neoplasm of upper-outer quadrant of left female breast: Secondary | ICD-10-CM

## 2014-10-06 MED ORDER — HYDROCODONE-ACETAMINOPHEN 5-325 MG PO TABS: 1.0000 | ORAL_TABLET | Freq: Four times a day (QID) | ORAL | Status: DC | PRN

## 2014-10-06 MED ORDER — FLUCONAZOLE 100 MG PO TABS: 100.0000 mg | ORAL_TABLET | Freq: Every day | ORAL | Status: DC

## 2014-10-06 NOTE — Telephone Encounter (Signed)
Received a call from [facility] in medical oncology regarding patient having fatigue and requesting handi-cap sticker.Patient doesn't require this from this clinic's standpoint per Dr.Wentworth.Radiation fatigue is short-term.

## 2014-10-06 NOTE — Progress Notes (Signed)
Weekly Management Note Current Dose:  21.6 Gy  Projected Dose: 60.4 Gy   Narrative:  The patient presents for routine under treatment assessment.  CBCT/MVCT images/Port film x-rays were reviewed.  The chart was checked. Extreme pain under breast. To ER last night. Vicodin helped. Nausea is better. Fatigue continues.   Physical Findings: Weight: 233 lb 3.2 oz (105.779 kg). Fungal changes in inframammary fold. Some early moist desquamation.   Impression:  The patient is tolerating radiation.  Plan:  Continue treatment as planned. Stop antifungal powder. Diflucan orally. Refilled vicodin.

## 2014-10-06 NOTE — Progress Notes (Signed)
Machine 2 down thus, patient not treated. Patient presented to nursing requesting to be seen by Dr. Pablo Ledger because "of blisters under her left breast." Hyperpigmentation without desquamation of left inframammary fold. Moisture noted under left breast. Reports left breast remains sore. Reports fatigue. Reports she presented to the ED last night for evaluation because of "the blisters under her breast." Patient was given bacitracin and vicodin then, sent home. Reports nausea has resolved. Denies taking compazine any longer.

## 2014-10-07 ENCOUNTER — Ambulatory Visit
Admission: RE | Admit: 2014-10-07 | Discharge: 2014-10-07 | Disposition: A | Discharge status: Home / Self Care | Payer: PRIVATE HEALTH INSURANCE | Source: Ambulatory Visit | Attending: Radiation Oncology | Admitting: Radiation Oncology

## 2014-10-07 DIAGNOSIS — Z510 Encounter for antineoplastic radiation therapy: Secondary | ICD-10-CM | POA: Diagnosis not present

## 2014-10-08 ENCOUNTER — Ambulatory Visit
Admission: RE | Admit: 2014-10-08 | Discharge: 2014-10-08 | Disposition: A | Discharge status: Home / Self Care | Payer: PRIVATE HEALTH INSURANCE | Source: Ambulatory Visit | Attending: Radiation Oncology | Admitting: Radiation Oncology

## 2014-10-08 DIAGNOSIS — Z510 Encounter for antineoplastic radiation therapy: Secondary | ICD-10-CM | POA: Diagnosis not present

## 2014-10-09 ENCOUNTER — Ambulatory Visit
Admission: RE | Admit: 2014-10-09 | Discharge: 2014-10-09 | Disposition: A | Discharge status: Home / Self Care | Payer: PRIVATE HEALTH INSURANCE | Source: Ambulatory Visit | Attending: Radiation Oncology | Admitting: Radiation Oncology

## 2014-10-09 DIAGNOSIS — Z510 Encounter for antineoplastic radiation therapy: Secondary | ICD-10-CM | POA: Diagnosis not present

## 2014-10-12 ENCOUNTER — Ambulatory Visit
Admission: RE | Admit: 2014-10-12 | Discharge: 2014-10-12 | Disposition: A | Discharge status: Home / Self Care | Payer: PRIVATE HEALTH INSURANCE | Source: Ambulatory Visit | Attending: Radiation Oncology | Admitting: Radiation Oncology

## 2014-10-12 DIAGNOSIS — Z510 Encounter for antineoplastic radiation therapy: Secondary | ICD-10-CM | POA: Diagnosis not present

## 2014-10-13 ENCOUNTER — Encounter: Payer: Self-pay | Admitting: Radiation Oncology

## 2014-10-13 ENCOUNTER — Encounter: Payer: Self-pay | Admitting: Hematology and Oncology

## 2014-10-13 ENCOUNTER — Ambulatory Visit
Admission: RE | Admit: 2014-10-13 | Discharge: 2014-10-13 | Disposition: A | Discharge status: Home / Self Care | Payer: PRIVATE HEALTH INSURANCE | Source: Ambulatory Visit | Attending: Radiation Oncology | Admitting: Radiation Oncology

## 2014-10-13 VITALS — BP 121/69 | HR 76 | Resp 16 | Wt 231.7 lb

## 2014-10-13 DIAGNOSIS — Z510 Encounter for antineoplastic radiation therapy: Secondary | ICD-10-CM | POA: Diagnosis not present

## 2014-10-13 DIAGNOSIS — C50412 Malignant neoplasm of upper-outer quadrant of left female breast: Secondary | ICD-10-CM

## 2014-10-13 NOTE — Progress Notes (Signed)
Spoke to pt and she has concerns regarding transportation so I referred her to Levada Dy the financial advocate for the radiation department so she can apply for their transportation grant.  She will meet with her today when she comes for treatment.

## 2014-10-13 NOTE — Progress Notes (Signed)
Weekly Management Note Current Dose:  30.6 Gy  Projected Dose: 60.4 Gy   Narrative:  The patient presents for routine under treatment assessment.  CBCT/MVCT images/Port film x-rays were reviewed.  The chart was checked. Complains of increasing fatigue.  Not working as much. Pain in left breast and possible left axillary swelling.   Physical Findings: Weight: 231 lb 11.2 oz (105.098 kg). Unchanged. Moist desquamation continues in inframammary fold. Left axilla is lsightly hyperpigmented.   Impression:  The patient is tolerating radiation.  Plan:  Continue treatment as planned. Declined handicap placard. Encouraged exercise. Will refer to social work (? Depression ? Marital issues) Continue bacitracin and radiaplex to breast.

## 2014-10-13 NOTE — Progress Notes (Signed)
Hyperpigmentation with moist desquamation under left breast. Reports using bactracin before bed and radiaplex on the top of the breast. Reports fatigue. Requesting a temporary handicap placard. Patient concerned about edema and soreness under left axilla. This RN didn't not visual any edema of the left axilla compared to the right. Weight and vitals stable.

## 2014-10-14 ENCOUNTER — Ambulatory Visit
Admission: RE | Admit: 2014-10-14 | Discharge: 2014-10-14 | Disposition: A | Discharge status: Home / Self Care | Payer: PRIVATE HEALTH INSURANCE | Source: Ambulatory Visit | Attending: Radiation Oncology | Admitting: Radiation Oncology

## 2014-10-14 DIAGNOSIS — Z510 Encounter for antineoplastic radiation therapy: Secondary | ICD-10-CM | POA: Diagnosis not present

## 2014-10-15 ENCOUNTER — Ambulatory Visit
Admission: RE | Admit: 2014-10-15 | Discharge: 2014-10-15 | Disposition: A | Discharge status: Home / Self Care | Payer: PRIVATE HEALTH INSURANCE | Source: Ambulatory Visit | Attending: Radiation Oncology | Admitting: Radiation Oncology

## 2014-10-15 DIAGNOSIS — Z510 Encounter for antineoplastic radiation therapy: Secondary | ICD-10-CM | POA: Diagnosis not present

## 2014-10-16 ENCOUNTER — Ambulatory Visit
Admission: RE | Admit: 2014-10-16 | Discharge: 2014-10-16 | Disposition: A | Discharge status: Home / Self Care | Payer: PRIVATE HEALTH INSURANCE | Source: Ambulatory Visit | Attending: Radiation Oncology | Admitting: Radiation Oncology

## 2014-10-16 DIAGNOSIS — Z510 Encounter for antineoplastic radiation therapy: Secondary | ICD-10-CM | POA: Diagnosis not present

## 2014-10-19 ENCOUNTER — Ambulatory Visit
Admission: RE | Admit: 2014-10-19 | Discharge: 2014-10-19 | Disposition: A | Discharge status: Home / Self Care | Payer: PRIVATE HEALTH INSURANCE | Source: Ambulatory Visit | Attending: Radiation Oncology | Admitting: Radiation Oncology

## 2014-10-19 ENCOUNTER — Ambulatory Visit: Payer: PRIVATE HEALTH INSURANCE

## 2014-10-20 ENCOUNTER — Ambulatory Visit
Admission: RE | Admit: 2014-10-20 | Discharge: 2014-10-20 | Disposition: A | Discharge status: Home / Self Care | Payer: PRIVATE HEALTH INSURANCE | Source: Ambulatory Visit | Attending: Radiation Oncology | Admitting: Radiation Oncology

## 2014-10-20 VITALS — BP 122/85 | HR 69 | Resp 16 | Wt 233.2 lb

## 2014-10-20 DIAGNOSIS — Z510 Encounter for antineoplastic radiation therapy: Secondary | ICD-10-CM | POA: Diagnosis not present

## 2014-10-20 DIAGNOSIS — C50412 Malignant neoplasm of upper-outer quadrant of left female breast: Secondary | ICD-10-CM

## 2014-10-20 NOTE — Progress Notes (Signed)
Weekly Management Note Current Dose: 37.8  Gy  Projected Dose: 50.4 Gy   Narrative:  The patient presents for routine under treatment assessment.  CBCT/MVCT images/Port film x-rays were reviewed.  The chart was checked. Pain is better under breast but now ahs pain under arm. Using radiaplex and neosporin. Working full time.   Physical Findings: Weight: 233 lb 3.2 oz (105.779 kg). Inframammary fold is clear with small islands of moist desquamation. Dry desquamation in axilla.   Impression:  The patient is tolerating radiation.  Plan:  Continue treatment as planned. Add telfa dressing continue neosporin and radiaplex. Encouraged her to continue treatment.

## 2014-10-20 NOTE — Progress Notes (Signed)
Hyperpigmentation with dry desquamation under left axilla. Reports using radiaplex and neosporin to affected area as directed. Reports fatigue. Provided patient with telfa to use under left axilla as barrier against friction. Weight and vitals stable.

## 2014-10-21 ENCOUNTER — Ambulatory Visit
Admission: RE | Admit: 2014-10-21 | Discharge: 2014-10-21 | Disposition: A | Discharge status: Home / Self Care | Payer: PRIVATE HEALTH INSURANCE | Source: Ambulatory Visit | Attending: Radiation Oncology | Admitting: Radiation Oncology

## 2014-10-21 DIAGNOSIS — Z510 Encounter for antineoplastic radiation therapy: Secondary | ICD-10-CM | POA: Diagnosis not present

## 2014-10-22 ENCOUNTER — Ambulatory Visit
Admission: RE | Admit: 2014-10-22 | Discharge: 2014-10-22 | Disposition: A | Discharge status: Home / Self Care | Payer: PRIVATE HEALTH INSURANCE | Source: Ambulatory Visit | Attending: Radiation Oncology | Admitting: Radiation Oncology

## 2014-10-22 DIAGNOSIS — Z510 Encounter for antineoplastic radiation therapy: Secondary | ICD-10-CM | POA: Diagnosis not present

## 2014-10-23 ENCOUNTER — Encounter: Payer: Self-pay | Admitting: *Deleted

## 2014-10-23 ENCOUNTER — Ambulatory Visit
Admission: RE | Admit: 2014-10-23 | Discharge: 2014-10-23 | Disposition: A | Discharge status: Home / Self Care | Payer: PRIVATE HEALTH INSURANCE | Source: Ambulatory Visit | Attending: Radiation Oncology | Admitting: Radiation Oncology

## 2014-10-23 DIAGNOSIS — Z510 Encounter for antineoplastic radiation therapy: Secondary | ICD-10-CM | POA: Diagnosis not present

## 2014-10-23 NOTE — Progress Notes (Signed)
[employer] Work  Clinical Social Work was referred by Pension scheme manager for assessment of psychosocial needs.  Clinical Social Worker met with patient alone at [facility] prior to her radiation to offer support and assess for needs. CSW also reviewed chart prior to meeting with [name] and she has a h/o seizure disorder and depression. She did not appear to be on medications for either currently. This CSW had spoken to [name] previously about her depression and she states she had never been on medication or received counseling in the past. [name] reports she continues to work during her treatment due to "having to take care of three kids". She reports she has children ages 14, 52 and 27yo. She denied issues with her children currently. [name] reports to have a car for transportation. She stated her friends and long-term boyfriend are very supportive currently. [name] shared some medical concerns, that MD was aware of. She shared she is eager for radiation to be over and has about a week left. She feels she can get through it ok. [name] aware to reach out to [facility] team as needed.   Clinical Social Work interventions:  Loren Racer, [place] Worker [place]  [place] Phone: [phone] Fax: [phone]

## 2014-10-25 ENCOUNTER — Ambulatory Visit: Payer: PRIVATE HEALTH INSURANCE

## 2014-10-26 ENCOUNTER — Encounter: Payer: Self-pay | Admitting: Radiation Oncology

## 2014-10-26 ENCOUNTER — Ambulatory Visit: Payer: PRIVATE HEALTH INSURANCE

## 2014-10-26 ENCOUNTER — Ambulatory Visit
Admission: RE | Admit: 2014-10-26 | Discharge: 2014-10-26 | Disposition: A | Discharge status: Home / Self Care | Payer: PRIVATE HEALTH INSURANCE | Source: Ambulatory Visit | Attending: Radiation Oncology | Admitting: Radiation Oncology

## 2014-10-26 VITALS — BP 115/59 | HR 59 | Temp 98.0°F | Resp 20 | Wt 231.1 lb

## 2014-10-26 DIAGNOSIS — Z510 Encounter for antineoplastic radiation therapy: Secondary | ICD-10-CM | POA: Diagnosis not present

## 2014-10-26 DIAGNOSIS — C50412 Malignant neoplasm of upper-outer quadrant of left female breast: Secondary | ICD-10-CM

## 2014-10-26 NOTE — Progress Notes (Signed)
Gave instructions on use of hydro gel pads, and gave also telfa pads, 2 mesh tube tops,  Patient sen by Dr. Valere Dross and is going to have treatment today , called the [facility] 2 blue spoke with Gaspar Skeeters therapist patient is coming back to get treated will see Dr. Pablo Ledger tomorrow 3:46 PM

## 2014-10-26 NOTE — Progress Notes (Signed)
Skin check left reast area before  Treatment has had 23 so far, c/o burning on mid chest and under inframmary fold, and between breast, using neosporin with pain relief on those areas that have peeled, and radiaplex on bre4ast area , c/o burning, will try gel pads, she is already using telfa pads under inframmary fold of left breast 3:33 PM BP 115/59 mmHg  Pulse 59  Temp(Src) 98 F (36.7 C) (Oral)  Resp 20  Wt 231 lb 1.6 oz (104.826 kg)  Wt Readings from Last 3 Encounters:  10/26/14 231 lb 1.6 oz (104.826 kg)  10/20/14 233 lb 3.2 oz (105.779 kg)  10/13/14 231 lb 11.2 oz (105.098 kg)

## 2014-10-26 NOTE — Progress Notes (Signed)
Weekly Management Note:  Site: Left breast Current Dose:  4140  cGy Projected Dose: 5040  cGy  Narrative: The patient is seen today for routine under treatment assessment. CBCT/MVCT images/port films were reviewed. The chart was reviewed.   She seen today prior to her treatment for a skin check.  As expected, she continues to have discomfort along her left inframammary fold and also along the upper inner quadrant of her left breast (sun exposed skin).  She is using Telfa pads along her inframammary fold.  Physical Examination:  Filed Vitals:   10/26/14 1530  BP: 115/59  Pulse: 59  Temp: 98 F (36.7 C)  Resp: 20  .  Weight: 231 lb 1.6 oz (104.826 kg).  There is good reepithelialization of her moist desquamation along the inframammary region.  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    Impression: Tolerating radiation therapy well, except for radiation dermatitis as expected.  I think she can continue with her radiation therapy.  We will give her gel pads to use as well.  She will see Dr. Pablo Ledger tomorrow.  Plan: Continue radiation therapy as planned.

## 2014-10-27 ENCOUNTER — Ambulatory Visit: Payer: PRIVATE HEALTH INSURANCE

## 2014-10-27 ENCOUNTER — Ambulatory Visit
Admission: RE | Admit: 2014-10-27 | Discharge: 2014-10-27 | Disposition: A | Discharge status: Home / Self Care | Payer: PRIVATE HEALTH INSURANCE | Source: Ambulatory Visit | Attending: Radiation Oncology | Admitting: Radiation Oncology

## 2014-10-27 ENCOUNTER — Encounter: Payer: Self-pay | Admitting: Radiation Oncology

## 2014-10-27 VITALS — BP 104/61 | HR 87 | Temp 98.1°F | Resp 16 | Ht 63.0 in | Wt 231.1 lb

## 2014-10-27 DIAGNOSIS — C50412 Malignant neoplasm of upper-outer quadrant of left female breast: Secondary | ICD-10-CM | POA: Diagnosis not present

## 2014-10-27 DIAGNOSIS — Z510 Encounter for antineoplastic radiation therapy: Secondary | ICD-10-CM | POA: Diagnosis not present

## 2014-10-27 MED ORDER — RADIAPLEXRX EX GEL
Freq: Once | CUTANEOUS | Status: AC
  Administered 2014-10-27: 17:00:00 via TOPICAL

## 2014-10-27 NOTE — Progress Notes (Signed)
Weekly Management Note Current Dose:  46.8 Gy  Projected Dose: 60.4 Gy   Narrative:  The patient presents for routine under treatment assessment.  CBCT/MVCT images/Port film x-rays were reviewed.  The chart was checked. Doing better with gel pads.   Physical Findings: Weight: 231 lb 1.6 oz (104.826 kg). Moist desquamation on inner aspect of breast and axilla is healing. Moist desquamation continues to be painful in inframammary fold.   Impression:  The patient is tolerating radiation.  Plan:  Continue treatment as planned. Genetian violet applied to inframammary fold. Patient had relief. Hydrogel pads given. Can reapply genetian as needed.

## 2014-10-27 NOTE — Progress Notes (Signed)
[name] has completed 26 fractions to her left breast.  She is reporting pain under her left breast at a 6/10.   She reports a poor appetite and fatigue.  The skin on her left breast is red with areas of desquamation underneath.  She is using gel pads and radiaplex gel.  She has requested a refill on radiaplex.  Another tube has been given.

## 2014-10-28 ENCOUNTER — Ambulatory Visit: Payer: PRIVATE HEALTH INSURANCE

## 2014-10-28 ENCOUNTER — Ambulatory Visit
Admission: RE | Admit: 2014-10-28 | Discharge: 2014-10-28 | Disposition: A | Discharge status: Home / Self Care | Payer: PRIVATE HEALTH INSURANCE | Source: Ambulatory Visit | Attending: Radiation Oncology | Admitting: Radiation Oncology

## 2014-10-28 DIAGNOSIS — Z510 Encounter for antineoplastic radiation therapy: Secondary | ICD-10-CM | POA: Diagnosis not present

## 2014-10-28 NOTE — Progress Notes (Signed)
Patient came to nursing for open area under left breast to be painted with gentian violet.states it feels so much better.given additional gel pads.No drainage.area has just about dried up.

## 2014-10-29 ENCOUNTER — Ambulatory Visit: Payer: PRIVATE HEALTH INSURANCE

## 2014-10-29 ENCOUNTER — Ambulatory Visit
Admission: RE | Admit: 2014-10-29 | Discharge: 2014-10-29 | Disposition: A | Discharge status: Home / Self Care | Payer: PRIVATE HEALTH INSURANCE | Source: Ambulatory Visit | Attending: Radiation Oncology | Admitting: Radiation Oncology

## 2014-10-30 ENCOUNTER — Ambulatory Visit: Payer: PRIVATE HEALTH INSURANCE

## 2014-11-02 ENCOUNTER — Ambulatory Visit: Payer: PRIVATE HEALTH INSURANCE

## 2014-11-02 ENCOUNTER — Ambulatory Visit
Admission: RE | Admit: 2014-11-02 | Discharge: 2014-11-02 | Disposition: A | Discharge status: Home / Self Care | Payer: PRIVATE HEALTH INSURANCE | Source: Ambulatory Visit | Attending: Radiation Oncology | Admitting: Radiation Oncology

## 2014-11-02 DIAGNOSIS — Z510 Encounter for antineoplastic radiation therapy: Secondary | ICD-10-CM | POA: Diagnosis not present

## 2014-11-03 ENCOUNTER — Ambulatory Visit: Payer: PRIVATE HEALTH INSURANCE

## 2014-11-03 ENCOUNTER — Ambulatory Visit
Admission: RE | Admit: 2014-11-03 | Discharge: 2014-11-03 | Disposition: A | Discharge status: Home / Self Care | Payer: PRIVATE HEALTH INSURANCE | Source: Ambulatory Visit | Attending: Radiation Oncology | Admitting: Radiation Oncology

## 2014-11-03 VITALS — BP 103/44 | HR 86 | Temp 98.7°F | Wt 230.8 lb

## 2014-11-03 DIAGNOSIS — Z510 Encounter for antineoplastic radiation therapy: Secondary | ICD-10-CM | POA: Diagnosis not present

## 2014-11-03 DIAGNOSIS — C50412 Malignant neoplasm of upper-outer quadrant of left female breast: Secondary | ICD-10-CM

## 2014-11-03 NOTE — Progress Notes (Signed)
Weekly Management Note Current Dose:  52.4 Gy  Projected Dose: 60.4 Gy   Narrative:  The patient presents for routine under treatment assessment.  CBCT/MVCT images/Port film x-rays were reviewed.  The chart was checked. Doing well. Inframammary fold is clearing with only a small area of opening. Requests letter for unlimited work hours and copy of letter from last weak. Using baby lotion on nipple at night.   Physical Findings: Weight: 230 lb 12.8 oz (104.69 kg). Dry desquamation in medial aspect of breast. Inframammary fold is healed with 1.5 x 2 cm area of moist desquamation on the chest wall.   Impression:  The patient is tolerating radiation.  Plan:  Continue treatment as planned. Follow up 2 weeks.

## 2014-11-04 ENCOUNTER — Ambulatory Visit: Payer: PRIVATE HEALTH INSURANCE

## 2014-11-04 ENCOUNTER — Ambulatory Visit
Admission: RE | Admit: 2014-11-04 | Discharge: 2014-11-04 | Disposition: A | Discharge status: Home / Self Care | Payer: PRIVATE HEALTH INSURANCE | Source: Ambulatory Visit | Attending: Radiation Oncology | Admitting: Radiation Oncology

## 2014-11-04 DIAGNOSIS — Z510 Encounter for antineoplastic radiation therapy: Secondary | ICD-10-CM | POA: Diagnosis not present

## 2014-11-05 ENCOUNTER — Ambulatory Visit
Admission: RE | Admit: 2014-11-05 | Discharge: 2014-11-05 | Disposition: A | Discharge status: Home / Self Care | Payer: PRIVATE HEALTH INSURANCE | Source: Ambulatory Visit | Attending: Radiation Oncology | Admitting: Radiation Oncology

## 2014-11-05 ENCOUNTER — Ambulatory Visit: Payer: PRIVATE HEALTH INSURANCE

## 2014-11-05 DIAGNOSIS — Z510 Encounter for antineoplastic radiation therapy: Secondary | ICD-10-CM | POA: Diagnosis not present

## 2014-11-06 ENCOUNTER — Ambulatory Visit
Admission: RE | Admit: 2014-11-06 | Discharge: 2014-11-06 | Disposition: A | Discharge status: Home / Self Care | Payer: PRIVATE HEALTH INSURANCE | Source: Ambulatory Visit | Attending: Radiation Oncology | Admitting: Radiation Oncology

## 2014-11-06 ENCOUNTER — Ambulatory Visit: Payer: PRIVATE HEALTH INSURANCE

## 2014-11-06 DIAGNOSIS — Z510 Encounter for antineoplastic radiation therapy: Secondary | ICD-10-CM | POA: Diagnosis not present

## 2014-11-06 NOTE — Progress Notes (Signed)
Gave patient a box of hydrogel pads per her request.

## 2014-11-09 ENCOUNTER — Ambulatory Visit: Payer: PRIVATE HEALTH INSURANCE

## 2014-11-09 ENCOUNTER — Ambulatory Visit
Admission: RE | Admit: 2014-11-09 | Discharge: 2014-11-09 | Disposition: A | Discharge status: Home / Self Care | Payer: PRIVATE HEALTH INSURANCE | Source: Ambulatory Visit | Attending: Radiation Oncology | Admitting: Radiation Oncology

## 2014-11-09 ENCOUNTER — Encounter: Payer: Self-pay | Admitting: Radiation Oncology

## 2014-11-09 DIAGNOSIS — Z510 Encounter for antineoplastic radiation therapy: Secondary | ICD-10-CM | POA: Diagnosis not present

## 2014-11-10 NOTE — Progress Notes (Addendum)
  Radiation Oncology         [phone] ________________________________  Name: POETRY CERRO MRN: [id number]  Date: 11/09/2014  DOB: Aug 23, 1975  End of Treatment Note  Diagnosis:   Breast cancer of upper-outer quadrant of left female breast   Staging form: Breast, AJCC 7th Edition     Clinical stage from 07/22/2014: Stage IA (T1c, N0, M0) - Unsigned      Indication for treatment:  Curative      Radiation treatment dates:   09/16/2014-11/09/2014  Site/dose:   Left breast/ 50.4 Gy at 1.8 Gy per fraction x 28 fractions.  Left breast boost/ 10 Gy at 2 Gy per fraction x 5 fractions  Beams/energy:  Opposed tangents with reduced fields / 6 and 10 MV photons 3D conformal boost / 6 and 15 Mv Photons  Narrative: The patient tolerated radiation treatment relatively well.   She unfortunately had moist desquamation over the inframammary fold and axilla which was treated with genetian violet. She missed several days of treatment due to a GI illness.   Plan: The patient has completed radiation treatment. The patient will return to radiation oncology clinic for routine followup in one month. I advised them to call or return sooner if they have any questions or concerns related to their recovery or treatment.  ------------------------------------------------  Thea Silversmith, MD

## 2014-11-12 ENCOUNTER — Encounter: Payer: Self-pay | Admitting: Hematology and Oncology

## 2014-11-12 NOTE — Progress Notes (Signed)
Pt called inquiring about the [employer] grant to assist with her rent.  I informed pt in order to qualify for the grant, one qualification is that she has to be in active treatment which she just completed her final radiation treatment so unfortunately, she cannot apply for the grant at this time.  She verbalized understanding.

## 2014-11-22 NOTE — Addendum Note (Signed)
Encounter addended by: Thea Silversmith, MD on: 11/22/2014  2:53 PM<BR>     Documentation filed: Notes Section

## 2014-11-22 NOTE — Progress Notes (Signed)
Name: [name]   MRN: [id number]  Date:  10/22/14   DOB: 09/06/1975  Status:outpatient    DIAGNOSIS: Breast cancer of upper-outer quadrant of left female breast   Staging form: Breast, AJCC 7th Edition     Clinical stage from 07/22/2014: Stage IA (T1c, N0, M0) - Unsigned   CONSENT VERIFIED: yes   SET UP: Patient is setup supine   IMMOBILIZATION:  The following immobilization was used:Custom Moldable Pillow, breast board.   NARRATIVE: [name] underwent complex simulation and treatment planning for her boost treatment today.  Her tumor volume was outlined on the planning CT scan.  Due to the depth of her cavity, electrons could not be used and a photon plan was developed. The plan will be prescribed to the 100% isodose line.   I personally supervised and approved the creation of 2 unique MLCs comprising 2  treatment devices.

## 2014-11-24 ENCOUNTER — Telehealth: Payer: Self-pay | Admitting: Adult Health

## 2014-11-24 NOTE — Telephone Encounter (Signed)
I left Ms. Goh a voicemail asking her to return my call to potentially coordinate her Survivorship Clinic visit with her follow-up apptwith Dr. Pablo Ledger tomorrow morning.  I would be able to see her at 8:30am on 11/25/14, after her appointment with Dr. Pablo Ledger at [facility].  I am awaiting her returned call.    Mike Craze, NP [place] [phone]

## 2014-11-25 ENCOUNTER — Telehealth: Payer: Self-pay | Admitting: Hematology and Oncology

## 2014-11-25 ENCOUNTER — Encounter: Payer: Self-pay | Admitting: Adult Health

## 2014-11-25 ENCOUNTER — Ambulatory Visit
Admission: RE | Admit: 2014-11-25 | Discharge: 2014-11-25 | Disposition: A | Discharge status: Home / Self Care | Payer: PRIVATE HEALTH INSURANCE | Source: Ambulatory Visit | Attending: Radiation Oncology | Admitting: Radiation Oncology

## 2014-11-25 ENCOUNTER — Encounter: Payer: Self-pay | Admitting: *Deleted

## 2014-11-25 VITALS — BP 105/48 | HR 60 | Temp 98.2°F | Wt 236.5 lb

## 2014-11-25 DIAGNOSIS — C50412 Malignant neoplasm of upper-outer quadrant of left female breast: Secondary | ICD-10-CM

## 2014-11-25 NOTE — Telephone Encounter (Signed)
pof noted,pt already has an appointment 7/6 and will keep this as scheduled due to dr Lindi Adie vacation schedule

## 2014-11-25 NOTE — Progress Notes (Signed)
I briefly met with [name] today after her 2-week f/u visit with Dr. Pablo Ledger.  I explained to her that she was eligible to see me now in the Survivorship Clinic if she wishes.  I gave her a copy of the [facility] Survivorship book, along with my card.  At this time, [name] would prefer to have her survivorship care plan mailed to her for her review in lieu of an appointment.  She is overall doing really well and is having minimal side effects of treatment, aside from some expected, intermittent breast pain. I will mail her care plan and let her know that she is welcomed to call me with any questions/concerns at any time.  I will not be making any additional attempts to schedule a survivorship visit, although I am happy to see her at any time in the future if she wishes.   Mike Craze, NP [place] [phone]

## 2014-11-25 NOTE — Progress Notes (Signed)
   Department of Radiation Oncology  Phone:  [phone] Fax:        [phone]   Name: [name] MRN: [id number]  DOB: [birth year]  Date: 11/25/2014  Follow Up Visit Note  Diagnosis: Breast cancer of upper-outer quadrant of left female breast   Staging form: Breast, AJCC 7th Edition     Clinical stage from 07/22/2014: Stage IA (T1c, N0, M0) - Unsigned  Summary and Interval since last radiation: 60.4 Gy to left breast completed 11/09/14.   Interval History: [name] presents today for routine followup.  Her skin has healed up well. She is doing well. She would like to meet with Dr. Lindi Adie again before starting her AI.    Physical Exam:  Filed Vitals:   11/25/14 0822  BP: 105/48  Pulse: 60  Temp: 98.2 F (36.8 C)  Weight: 236 lb 8 oz (107.276 kg)   Dark skin over right breast. Healing well. Some hypopigmentation in the inframammary fold.   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 We discussed the need for follow up every 4-6 months which she has scheduled.  We discussed the need for yearly mammograms which she can schedule with her OBGYN or with medical oncology. We discussed the need for sun protection in the treated area.  She can always call me with questions.  I will follow up with her on an as needed basis.   She met with our survivorship navigator and will be referred back to med onc for discussion of antiestrogen therapy.    Thea Silversmith, MD  This document serves as a record of services personally performed by Thea Silversmith, MD. It was created on her behalf by Derek Mound, a trained medical scribe. The creation of this record is based on the scribe's personal observations and the provider's statements to them. This document has been checked and approved by the attending provider.

## 2014-11-27 ENCOUNTER — Telehealth: Payer: Self-pay | Admitting: *Deleted

## 2014-11-27 NOTE — Telephone Encounter (Signed)
Received vm message @ 2:06 pm from patient regarding one of her medications. Attempted call back but reached answering machine. Left message for pt to call back.

## 2014-12-01 NOTE — Telephone Encounter (Signed)
PT.RETURNED CALL AT 4:30PM. PT. STARTED THE ANASTROZOLE ON 11/26/14. SINCE STARTING THE ANASTROZOLE SHE HAS BEEN NAUSEATED BUT NO VOMITING. IT DOES NOT MATTER IF SHE TAKES THE ANASTROZOLE IN THE MORNING OR AT NIGHT. SHE HAS TRIED WITH AND WITHOUT FOOD. PT. STAYS NAUSEATED.

## 2014-12-02 ENCOUNTER — Other Ambulatory Visit: Payer: Self-pay | Admitting: *Deleted

## 2014-12-02 DIAGNOSIS — C50412 Malignant neoplasm of upper-outer quadrant of left female breast: Secondary | ICD-10-CM

## 2014-12-02 MED ORDER — PROCHLORPERAZINE MALEATE 5 MG PO TABS: 5.0000 mg | ORAL_TABLET | Freq: Four times a day (QID) | ORAL | Status: DC | PRN

## 2014-12-02 NOTE — Telephone Encounter (Signed)
Returned patient's call. Spoke with Dr. Jana Hakim. Compazine sent to patient's pharmacy. Advised patient to take Compazine with Anastrozole at bedtime. Advised to do this for a couple of weeks and if symptoms do not improve give [place] a call back. Patient verbalized understanding.

## 2014-12-04 ENCOUNTER — Encounter (HOSPITAL_COMMUNITY): Payer: Self-pay

## 2014-12-09 ENCOUNTER — Emergency Department (HOSPITAL_COMMUNITY)
Admission: EM | Admit: 2014-12-09 | Discharge: 2014-12-09 | Disposition: A | Discharge status: Home / Self Care | Payer: PRIVATE HEALTH INSURANCE | Attending: Emergency Medicine | Admitting: Emergency Medicine

## 2014-12-09 ENCOUNTER — Encounter (HOSPITAL_COMMUNITY): Payer: Self-pay | Admitting: Family Medicine

## 2014-12-09 DIAGNOSIS — Z90710 Acquired absence of both cervix and uterus: Secondary | ICD-10-CM | POA: Diagnosis not present

## 2014-12-09 DIAGNOSIS — Z853 Personal history of malignant neoplasm of breast: Secondary | ICD-10-CM | POA: Insufficient documentation

## 2014-12-09 DIAGNOSIS — M5441 Lumbago with sciatica, right side: Secondary | ICD-10-CM | POA: Insufficient documentation

## 2014-12-09 DIAGNOSIS — J45909 Unspecified asthma, uncomplicated: Secondary | ICD-10-CM | POA: Insufficient documentation

## 2014-12-09 DIAGNOSIS — R110 Nausea: Secondary | ICD-10-CM | POA: Insufficient documentation

## 2014-12-09 DIAGNOSIS — R109 Unspecified abdominal pain: Secondary | ICD-10-CM | POA: Diagnosis not present

## 2014-12-09 DIAGNOSIS — R011 Cardiac murmur, unspecified: Secondary | ICD-10-CM | POA: Insufficient documentation

## 2014-12-09 DIAGNOSIS — Z9851 Tubal ligation status: Secondary | ICD-10-CM | POA: Diagnosis not present

## 2014-12-09 DIAGNOSIS — M545 Low back pain: Secondary | ICD-10-CM | POA: Diagnosis present

## 2014-12-09 LAB — URINALYSIS, ROUTINE W REFLEX MICROSCOPIC
Bilirubin Urine: NEGATIVE
Glucose, UA: NEGATIVE mg/dL
KETONES UR: NEGATIVE mg/dL
LEUKOCYTES UA: NEGATIVE
NITRITE: NEGATIVE
PROTEIN: NEGATIVE mg/dL
Specific Gravity, Urine: 1.008 (ref 1.005–1.030)
UROBILINOGEN UA: 0.2 mg/dL (ref 0.0–1.0)
pH: 6 (ref 5.0–8.0)

## 2014-12-09 LAB — COMPREHENSIVE METABOLIC PANEL
ALBUMIN: 3.6 g/dL (ref 3.5–5.0)
ALK PHOS: 86 U/L (ref 38–126)
ALT: 18 U/L (ref 14–54)
ANION GAP: 6 (ref 5–15)
AST: 22 U/L (ref 15–41)
BUN: 8 mg/dL (ref 6–20)
CHLORIDE: 105 mmol/L (ref 101–111)
CO2: 28 mmol/L (ref 22–32)
CREATININE: 0.91 mg/dL (ref 0.44–1.00)
Calcium: 9.3 mg/dL (ref 8.9–10.3)
GFR calc Af Amer: >60 mL/min (ref >60)
GFR calc non Af Amer: >60 mL/min (ref >60)
Glucose, Bld: 99 mg/dL (ref 65–99)
POTASSIUM: 4.4 mmol/L (ref 3.5–5.1)
Sodium: 139 mmol/L (ref 135–145)
Total Bilirubin: 0.4 mg/dL (ref 0.3–1.2)
Total Protein: 6.7 g/dL (ref 6.5–8.1)

## 2014-12-09 LAB — CBC WITH DIFFERENTIAL/PLATELET
BASOS ABS: 0 10*3/uL (ref 0.0–0.1)
BASOS PCT: 0 % (ref 0–1)
EOS ABS: 0.1 10*3/uL (ref 0.0–0.7)
EOS PCT: 2 % (ref 0–5)
HEMATOCRIT: 39.9 % (ref 36.0–46.0)
Hemoglobin: 12.8 g/dL (ref 12.0–15.0)
Lymphocytes Relative: 16 % (ref 12–46)
Lymphs Abs: 1 10*3/uL (ref 0.7–4.0)
MCH: 26.5 pg (ref 26.0–34.0)
MCHC: 32.1 g/dL (ref 30.0–36.0)
MCV: 82.6 fL (ref 78.0–100.0)
MONO ABS: 0.4 10*3/uL (ref 0.1–1.0)
Monocytes Relative: 6 % (ref 3–12)
Neutro Abs: 5.1 10*3/uL (ref 1.7–7.7)
Neutrophils Relative %: 76 % (ref 43–77)
Platelets: 291 10*3/uL (ref 150–400)
RBC: 4.83 MIL/uL (ref 3.87–5.11)
RDW: 14.5 % (ref 11.5–15.5)
WBC: 6.7 10*3/uL (ref 4.0–10.5)

## 2014-12-09 LAB — LIPASE, BLOOD: LIPASE: 15 U/L — AB (ref 22–51)

## 2014-12-09 LAB — URINE MICROSCOPIC-ADD ON

## 2014-12-09 MED ORDER — METHOCARBAMOL 500 MG PO TABS: 500.0000 mg | ORAL_TABLET | Freq: Two times a day (BID) | ORAL | Status: DC

## 2014-12-09 MED ORDER — HYDROCODONE-ACETAMINOPHEN 5-325 MG PO TABS: 2.0000 | ORAL_TABLET | ORAL | Status: DC | PRN

## 2014-12-09 NOTE — Discharge Instructions (Signed)
Back Pain, Adult °Back pain is very common. The pain often gets better over time. The cause of back pain is usually not dangerous. Most people can learn to manage their back pain on their own.  °HOME CARE  °· Stay active. Start with short walks on flat ground if you can. Try to walk farther each day. °· Do not sit, drive, or stand in one place for more than 30 minutes. Do not stay in bed. °· Do not avoid exercise or work. Activity can help your back heal faster. °· Be careful when you bend or lift an object. Bend at your knees, keep the object close to you, and do not twist. °· Sleep on a firm mattress. Lie on your side, and bend your knees. If you lie on your back, put a pillow under your knees. °· Only take medicines as told by your doctor. °· Put ice on the injured area. °¨ Put ice in a plastic bag. °¨ Place a towel between your skin and the bag. °¨ Leave the ice on for 15-20 minutes, 03-04 times a day for the first 2 to 3 days. After that, you can switch between ice and heat packs. °· Ask your doctor about back exercises or massage. °· Avoid feeling anxious or stressed. Find good ways to deal with stress, such as exercise. °GET HELP RIGHT AWAY IF:  °· Your pain does not go away with rest or medicine. °· Your pain does not go away in 1 week. °· You have new problems. °· You do not feel well. °· The pain spreads into your legs. °· You cannot control when you poop (bowel movement) or pee (urinate). °· Your arms or legs feel weak or lose feeling (numbness). °· You feel sick to your stomach (nauseous) or throw up (vomit). °· You have belly (abdominal) pain. °· You feel like you may pass out (faint). °MAKE SURE YOU:  °· Understand these instructions. °· Will watch your condition. °· Will get help right away if you are not doing well or get worse. °Document Released: 11/29/2007 Document Revised: 09/04/2011 Document Reviewed: 10/14/2013 °ExitCare® Patient Information ©2015 [employer]. This information is not intended  to replace advice given to you by your health care provider. Make sure you discuss any questions you have with your health care provider. ° °

## 2014-12-09 NOTE — ED Notes (Signed)
Pt here for lower back pain, abd pain x a few days. sts she has been taking medication for radiation.

## 2014-12-09 NOTE — ED Provider Notes (Signed)
CSN: [id number]     Arrival date & time 12/09/14  1317 History  This chart was scribed for non-physician practitioner, Caryl Ada, PA-C working with Sherwood Gambler, MD by Rayna Sexton, ED scribe. This patient was seen in room TR07C/TR07C and the patient's care was started at 1:58 PM.       Chief Complaint  Patient presents with  . Back Pain  . Abdominal Pain  . Nausea   Patient is a 39 y.o. female presenting with back pain. The history is provided by the patient. No language interpreter was used.  Back Pain Location:  Lumbar spine Quality:  Aching Pain severity:  Moderate Pain is:  Worse during the day Onset quality:  Gradual Timing:  Constant Progression:  Worsening Chronicity:  New Relieved by:  Nothing Worsened by:  Nothing tried Associated symptoms: abdominal pain   Associated symptoms: no numbness   Risk factors: no hx of osteoporosis    Pt has a history of breast cancer.  HPI Comments: [name] is a 39 y.o. female who presents to the Emergency Department complaining of constant, moderate, right sided abdominal pain and bilateral lower back pain with onset a few days ago. Pt notes a worsening of symptoms with general ambulation. She further notes a history of cancer and currently taking a prescription medication for radiation. She denies any history of DM or HTN. Pt denies fever, vomiting, diarrhea, and numbness or tingling of the legs bilaterally.   Past Medical History  Diagnosis Date  . History of seizures     during pregnancies; last seizure 3 yrs. ago  . Stress headaches   . Asthma     no current med.  . Breast cancer of upper-outer quadrant of left female breast 07/16/2014  . Heart murmur     as a child   Past Surgical History  Procedure Laterality Date  . Cesarean section  [id number]  . Tympanostomy tube placement    . Total abdominal hysterectomy w/ bilateral salpingoophorectomy  12/20/2009  . Hysteroscopy w/ endometrial ablation  11/11/2007  .  Lysis of adhesion  12/20/2009  . Cysto  12/20/2009  . Cesarean section with bilateral tubal ligation  03/03/2003   Family History  Problem Relation Age of Onset  . Hypertension Mother   . Breast cancer Other     MGM's sister  . Breast cancer Maternal Grandmother     dx in her 46s   History  Substance Use Topics  . Smoking status: Never Smoker   . Smokeless tobacco: Never Used  . Alcohol Use: No   OB History    No data available     Review of Systems  Gastrointestinal: Positive for abdominal pain.  Musculoskeletal: Positive for myalgias and back pain.  Neurological: Negative for numbness.  All other systems reviewed and are negative.     Allergies  Review of patient's allergies indicates no known allergies.  Home Medications   Prior to Admission medications   Medication Sig Start Date End Date Taking? Authorizing Provider  anastrozole (ARIMIDEX) 1 MG tablet Take 1 tablet (1 mg total) by mouth daily. Patient not taking: Reported on 11/25/2014 08/26/14   Nicholas Lose, MD  prochlorperazine (COMPAZINE) 5 MG tablet Take 1 tablet (5 mg total) by mouth every 6 (six) hours as needed for nausea or vomiting. 12/02/14   Chauncey Cruel, MD  Wound Dressings (RADIAGEL EX) Apply 90 g topically.    Historical Provider, MD   BP 108/70 mmHg  Pulse 71  Temp(Src) 98.5 F (36.9 C)  Resp 18  SpO2 98% Physical Exam  Constitutional: She is oriented to person, place, and time. She appears well-developed and well-nourished. No distress.  HENT:  Head: Normocephalic and atraumatic.  Mouth/Throat: Oropharynx is clear and moist.  Eyes: Conjunctivae and EOM are normal. Pupils are equal, round, and reactive to light.  Neck: Normal range of motion. Neck supple. No tracheal deviation present.  Cardiovascular: Normal rate.   Pulmonary/Chest: Breath sounds normal. No respiratory distress.  Abdominal: Soft.  Musculoskeletal: She exhibits tenderness.  Tender right low back   Neurological: She is alert  and oriented to person, place, and time.  Skin: Skin is warm and dry.  Psychiatric: She has a normal mood and affect. Her behavior is normal.  Nursing note and vitals reviewed.   ED Course  Procedures  DIAGNOSTIC STUDIES: Oxygen Saturation is 98% on RA, normal by my interpretation.    COORDINATION OF CARE: 2:01 PM Discussed treatment plan with pt at bedside including a urinalysis and further evaluation. Pt agreed to plan.  Labs Review Labs Reviewed  URINALYSIS, ROUTINE W REFLEX MICROSCOPIC (NOT AT [facility]) - Abnormal; Notable for the following:    Hgb urine dipstick TRACE (*)    All other components within normal limits  URINE MICROSCOPIC-ADD ON - Abnormal; Notable for the following:    Squamous Epithelial / LPF FEW (*)    All other components within normal limits  LIPASE, BLOOD - Abnormal; Notable for the following:    Lipase 15 (*)    All other components within normal limits  CBC WITH DIFFERENTIAL/PLATELET  COMPREHENSIVE METABOLIC PANEL   Results for orders placed or performed during the hospital encounter of 12/09/14  Urinalysis, Routine w reflex microscopic (not at [facility])  Result Value Ref Range   Color, Urine YELLOW YELLOW   APPearance CLEAR CLEAR   Specific Gravity, Urine 1.008 1.005 - 1.030   pH 6.0 5.0 - 8.0   Glucose, UA NEGATIVE NEGATIVE mg/dL   Hgb urine dipstick TRACE (A) NEGATIVE   Bilirubin Urine NEGATIVE NEGATIVE   Ketones, ur NEGATIVE NEGATIVE mg/dL   Protein, ur NEGATIVE NEGATIVE mg/dL   Urobilinogen, UA 0.2 0.0 - 1.0 mg/dL   Nitrite NEGATIVE NEGATIVE   Leukocytes, UA NEGATIVE NEGATIVE  Urine microscopic-add on  Result Value Ref Range   Squamous Epithelial / LPF FEW (A) RARE   WBC, UA 0-2 <3 WBC/hpf   RBC / HPF 0-2 <3 RBC/hpf   Bacteria, UA RARE RARE  CBC with Differential/Platelet  Result Value Ref Range   WBC 6.7 4.0 - 10.5 K/uL   RBC 4.83 3.87 - 5.11 MIL/uL   Hemoglobin 12.8 12.0 - 15.0 g/dL   HCT 39.9 36.0 - 46.0 %   MCV 82.6 78.0 - 100.0 fL    MCH 26.5 26.0 - 34.0 pg   MCHC 32.1 30.0 - 36.0 g/dL   RDW 14.5 11.5 - 15.5 %   Platelets 291 150 - 400 K/uL   Neutrophils Relative % 76 43 - 77 %   Neutro Abs 5.1 1.7 - 7.7 K/uL   Lymphocytes Relative 16 12 - 46 %   Lymphs Abs 1.0 0.7 - 4.0 K/uL   Monocytes Relative 6 3 - 12 %   Monocytes Absolute 0.4 0.1 - 1.0 K/uL   Eosinophils Relative 2 0 - 5 %   Eosinophils Absolute 0.1 0.0 - 0.7 K/uL   Basophils Relative 0 0 - 1 %   Basophils Absolute 0.0 0.0 - 0.1 K/uL  Comprehensive metabolic panel  Result Value Ref Range   Sodium 139 135 - 145 mmol/L   Potassium 4.4 3.5 - 5.1 mmol/L   Chloride 105 101 - 111 mmol/L   CO2 28 22 - 32 mmol/L   Glucose, Bld 99 65 - 99 mg/dL   BUN 8 6 - 20 mg/dL   Creatinine, Ser 0.91 0.44 - 1.00 mg/dL   Calcium 9.3 8.9 - 10.3 mg/dL   Total Protein 6.7 6.5 - 8.1 g/dL   Albumin 3.6 3.5 - 5.0 g/dL   AST 22 15 - 41 U/L   ALT 18 14 - 54 U/L   Alkaline Phosphatase 86 38 - 126 U/L   Total Bilirubin 0.4 0.3 - 1.2 mg/dL   GFR calc non Af Amer >60 >60 mL/min   GFR calc Af Amer >60 >60 mL/min   Anion gap 6 5 - 15  Lipase, blood  Result Value Ref Range   Lipase 15 (L) 22 - 51 U/L   No results found.  Imaging Review No results found.   EKG Interpretation None      Results for orders placed or performed during the hospital encounter of 12/09/14  Urinalysis, Routine w reflex microscopic (not at [facility])  Result Value Ref Range   Color, Urine YELLOW YELLOW   APPearance CLEAR CLEAR   Specific Gravity, Urine 1.008 1.005 - 1.030   pH 6.0 5.0 - 8.0   Glucose, UA NEGATIVE NEGATIVE mg/dL   Hgb urine dipstick TRACE (A) NEGATIVE   Bilirubin Urine NEGATIVE NEGATIVE   Ketones, ur NEGATIVE NEGATIVE mg/dL   Protein, ur NEGATIVE NEGATIVE mg/dL   Urobilinogen, UA 0.2 0.0 - 1.0 mg/dL   Nitrite NEGATIVE NEGATIVE   Leukocytes, UA NEGATIVE NEGATIVE  Urine microscopic-add on  Result Value Ref Range   Squamous Epithelial / LPF FEW (A) RARE   WBC, UA 0-2 <3 WBC/hpf    RBC / HPF 0-2 <3 RBC/hpf   Bacteria, UA RARE RARE  CBC with Differential/Platelet  Result Value Ref Range   WBC 6.7 4.0 - 10.5 K/uL   RBC 4.83 3.87 - 5.11 MIL/uL   Hemoglobin 12.8 12.0 - 15.0 g/dL   HCT 39.9 36.0 - 46.0 %   MCV 82.6 78.0 - 100.0 fL   MCH 26.5 26.0 - 34.0 pg   MCHC 32.1 30.0 - 36.0 g/dL   RDW 14.5 11.5 - 15.5 %   Platelets 291 150 - 400 K/uL   Neutrophils Relative % 76 43 - 77 %   Neutro Abs 5.1 1.7 - 7.7 K/uL   Lymphocytes Relative 16 12 - 46 %   Lymphs Abs 1.0 0.7 - 4.0 K/uL   Monocytes Relative 6 3 - 12 %   Monocytes Absolute 0.4 0.1 - 1.0 K/uL   Eosinophils Relative 2 0 - 5 %   Eosinophils Absolute 0.1 0.0 - 0.7 K/uL   Basophils Relative 0 0 - 1 %   Basophils Absolute 0.0 0.0 - 0.1 K/uL  Comprehensive metabolic panel  Result Value Ref Range   Sodium 139 135 - 145 mmol/L   Potassium 4.4 3.5 - 5.1 mmol/L   Chloride 105 101 - 111 mmol/L   CO2 28 22 - 32 mmol/L   Glucose, Bld 99 65 - 99 mg/dL   BUN 8 6 - 20 mg/dL   Creatinine, Ser 0.91 0.44 - 1.00 mg/dL   Calcium 9.3 8.9 - 10.3 mg/dL   Total Protein 6.7 6.5 - 8.1 g/dL   Albumin 3.6 3.5 -  5.0 g/dL   AST 22 15 - 41 U/L   ALT 18 14 - 54 U/L   Alkaline Phosphatase 86 38 - 126 U/L   Total Bilirubin 0.4 0.3 - 1.2 mg/dL   GFR calc non Af Amer >60 >60 mL/min   GFR calc Af Amer >60 >60 mL/min   Anion gap 6 5 - 15  Lipase, blood  Result Value Ref Range   Lipase 15 (L) 22 - 51 U/L   No results found.   MDM   Final diagnoses:  Right-sided low back pain with right-sided sciatica    Hydrocodone Return if any problems.  avs   Fransico Meadow, PA-C 12/09/14 [place], PA-C 12/09/14 [place], MD 12/10/14 [phone]

## 2014-12-30 ENCOUNTER — Encounter: Payer: Self-pay | Admitting: Hematology and Oncology

## 2014-12-30 ENCOUNTER — Ambulatory Visit (HOSPITAL_BASED_OUTPATIENT_CLINIC_OR_DEPARTMENT_OTHER): Payer: PRIVATE HEALTH INSURANCE | Admitting: Hematology and Oncology

## 2014-12-30 VITALS — BP 111/65 | HR 70 | Temp 98.3°F | Resp 18 | Ht 63.0 in | Wt 232.9 lb

## 2014-12-30 DIAGNOSIS — Z171 Estrogen receptor negative status [ER-]: Secondary | ICD-10-CM | POA: Diagnosis not present

## 2014-12-30 DIAGNOSIS — C50412 Malignant neoplasm of upper-outer quadrant of left female breast: Secondary | ICD-10-CM | POA: Diagnosis not present

## 2014-12-30 NOTE — Progress Notes (Signed)
Patient Care Team: Alphonsa Overall, MD as Consulting Physician (General Surgery) Nicholas Lose, MD as Consulting Physician (Hematology and Oncology) Thea Silversmith, MD as Consulting Physician (Radiation Oncology) Rockwell Germany, RN as Registered Nurse Mauro Kaufmann, RN as Registered Nurse Holley Bouche, NP as Nurse Practitioner (Nurse Practitioner)  DIAGNOSIS: Breast cancer of upper-outer quadrant of left female breast   Staging form: Breast, AJCC 7th Edition     Clinical stage from 07/22/2014: Stage IA (T1c, N0, M0) - Unsigned   SUMMARY OF ONCOLOGIC HISTORY:   Breast cancer of upper-outer quadrant of left female breast   07/14/2014 Initial Diagnosis Left breast 2:00: Invasive ductal carcinoma with DCIS, intramammary lymph node biopsy negative, grade 2, ER 100%, PR 96% Ki-67 17%, HER-2 negative ratio 1.17   07/14/2014 Mammogram Left breast 2:00 position 1.5 cm mass with 2 intramammary lymph nodes one of which has cortical thickening which was biopsied   08/06/2014 Surgery Left breast lumpectomy: Invasive ductal carcinoma with DCIS, no LVI, 2 sentinel nodes negative, 1.3 cm, grade 1, 100%, PR 96 wasn't, HER-2 negative Ki-67 17% Oncotype DX recurrence score 7, 6% ROR   09/16/2014 - 11/09/2014 Radiation Therapy Left breast/ 50.4 Gy at 1.8 Gy per fraction x 28 fractions.  Left breast boost/ 10 Gy at 2 Gy per fraction x 5 fractions   11/25/2014 -  Anti-estrogen oral therapy Anastrozole 1 mg daily    CHIEF COMPLIANT: Follow-up on anastrozole  INTERVAL HISTORY: [name] is a 39 year old with above-mentioned history of left breast cancer currently on antiestrogen therapy with anastrozole since June 2016. She is tolerating it very well. Her major complaint is fatigue which starts about 5-6 hours after she wakes up and she cannot do a lot of activities rest of the day. She also had nausea related anastrozole initially but when she started taking it at bedtime her nausea is completely  resolved.  REVIEW OF SYSTEMS:   Constitutional: Denies fevers, chills or abnormal weight loss Eyes: Denies blurriness of vision Ears, nose, mouth, throat, and face: Denies mucositis or sore throat Respiratory: Denies cough, dyspnea or wheezes Cardiovascular: Denies palpitation, chest discomfort or lower extremity swelling Gastrointestinal:  Nausea related to anastrozole Skin: Denies abnormal skin rashes Lymphatics: Denies new lymphadenopathy or easy bruising Neurological:Denies numbness, tingling or new weaknesses Behavioral/Psych: Mood is stable, no new changes  Breast:  denies any pain or lumps or nodules in either breasts All other systems were reviewed with the patient and are negative.  I have reviewed the past medical history, past surgical history, social history and family history with the patient and they are unchanged from previous note.  ALLERGIES:  has No Known Allergies.  MEDICATIONS:  Current Outpatient Prescriptions  Medication Sig Dispense Refill  . anastrozole (ARIMIDEX) 1 MG tablet Take 1 tablet (1 mg total) by mouth daily. 90 tablet 3  . prochlorperazine (COMPAZINE) 5 MG tablet Take 1 tablet (5 mg total) by mouth every 6 (six) hours as needed for nausea or vomiting. 30 tablet 0  . Wound Dressings (RADIAGEL EX) Apply 90 g topically.     No current facility-administered medications for this visit.    PHYSICAL EXAMINATION: ECOG PERFORMANCE STATUS: 1 - Symptomatic but completely ambulatory  Filed Vitals:   12/30/14 1511  BP: 111/65  Pulse: 70  Temp: 98.3 F (36.8 C)  Resp: 18   Filed Weights   12/30/14 1511  Weight: 232 lb 14.4 oz (105.643 kg)    GENERAL:alert, no distress and comfortable SKIN: skin color,  texture, turgor are normal, no rashes or significant lesions EYES: normal, Conjunctiva are pink and non-injected, sclera clear OROPHARYNX:no exudate, no erythema and lips, buccal mucosa, and tongue normal  NECK: supple, thyroid normal size,  non-tender, without nodularity LYMPH:  no palpable lymphadenopathy in the cervical, axillary or inguinal LUNGS: clear to auscultation and percussion with normal breathing effort HEART: regular rate & rhythm and no murmurs and no lower extremity edema ABDOMEN:abdomen soft, non-tender and normal bowel sounds Musculoskeletal:no cyanosis of digits and no clubbing  NEURO: alert & oriented x 3 with fluent speech, no focal motor/sensory deficits  LABORATORY DATA:  I have reviewed the data as listed   Chemistry      Component Value Date/Time   NA 139 12/09/2014 1422   NA 141 07/22/2014 0824   K 4.4 12/09/2014 1422   K 4.0 07/22/2014 0824   CL 105 12/09/2014 1422   CO2 28 12/09/2014 1422   CO2 26 07/22/2014 0824   BUN 8 12/09/2014 1422   BUN 14.4 07/22/2014 0824   CREATININE 0.91 12/09/2014 1422   CREATININE 0.9 07/22/2014 0824      Component Value Date/Time   CALCIUM 9.3 12/09/2014 1422   CALCIUM 9.5 07/22/2014 0824   ALKPHOS 86 12/09/2014 1422   ALKPHOS 78 07/22/2014 0824   AST 22 12/09/2014 1422   AST 19 07/22/2014 0824   ALT 18 12/09/2014 1422   ALT 24 07/22/2014 0824   BILITOT 0.4 12/09/2014 1422   BILITOT 0.55 07/22/2014 0824       Lab Results  Component Value Date   WBC 6.7 12/09/2014   HGB 12.8 12/09/2014   HCT 39.9 12/09/2014   MCV 82.6 12/09/2014   PLT 291 12/09/2014   NEUTROABS 5.1 12/09/2014   Department and obviously is not until the end of this month for curetting I saw that your list of advised to call ASSESSMENT & PLAN:  Breast cancer of upper-outer quadrant of left female breast Left breast lumpectomy: Invasive ductal carcinoma with DCIS, no LVI, 2 sentinel nodes negative, 1.3 cm, grade 1, 100%, PR 96 wasn't, HER-2 negative Ki-67 17% T1 cN0 M0 stage IA Oncotype DX recurrence score is 7, 6% ROR status post radiation completed May 2016, patient had hysterectomy with bilateral salpingo-oophorectomy 2011)  Current treatment: Anastrozole 1 mg daily started June  2016 Anastrozole toxicities:  1. Fatigue: It could be related to prior radiation therapy. After 5 hours through the day she feels tired. I encouraged her to participate in [facility] program. She does not have any hot flashes or myalgias. Occasion she gets headaches. 2. Nausea if she takes a pill in the morning.  Return to clinic in 6 months for follow-up   No orders of the defined types were placed in this encounter.   The patient has a good understanding of the overall plan. she agrees with it. she will call with any problems that may develop before the next visit here.   Rulon Eisenmenger, MD

## 2014-12-30 NOTE — Assessment & Plan Note (Signed)
Left breast lumpectomy: Invasive ductal carcinoma with DCIS, no LVI, 2 sentinel nodes negative, 1.3 cm, grade 1, 100%, PR 96 wasn't, HER-2 negative Ki-67 17% T1 cN0 M0 stage IA Oncotype DX recurrence score is 7, 6% ROR status post radiation completed May 2016, patient had hysterectomy with bilateral salpingo-oophorectomy 2011)  Current treatment: Anastrozole 1 mg daily starting June 2016 Anastrozole toxicities:   Return to clinic in 6 months for follow-up

## 2014-12-31 ENCOUNTER — Ambulatory Visit: Payer: PRIVATE HEALTH INSURANCE | Admitting: Hematology and Oncology

## 2014-12-31 ENCOUNTER — Telehealth: Payer: Self-pay | Admitting: Hematology and Oncology

## 2014-12-31 NOTE — Telephone Encounter (Signed)
Spoke with patient and she is aware of her 59month appointment

## 2015-01-01 ENCOUNTER — Telehealth: Payer: Self-pay | Admitting: *Deleted

## 2015-01-01 NOTE — Telephone Encounter (Signed)
Spoke to pt for 3 mon f/u. Denies needs or complaints. Encourage pt to call with questions or concerns. Received verbal understanding. Contact information given.

## 2015-01-06 ENCOUNTER — Telehealth: Payer: Self-pay | Admitting: *Deleted

## 2015-01-06 NOTE — Telephone Encounter (Signed)
Patient called stating that she is having possible side effects from Arimidex. Patient started this medication in June and states that while on the medication she is experiencing dizziness/lightheadedness. When patient starts to experience these symptoms she stops taking the medication for a day or so. Once the patient stops taking the medication and restarts, she begins to feel sick on her stomach followed by a headache. Over this past month, patient has only missed three doses. Currently she is having a headache (pain on scale 0-10, patient states is an 8) that she is trying to manage with Excedrin with little relief. Please advise. Message sent to MD Lawerance Bach.

## 2015-01-06 NOTE — Telephone Encounter (Signed)
LMOVM - Pt to hold arimidex and follow up with [facility] in one month.  pof entered.

## 2015-01-07 ENCOUNTER — Telehealth: Payer: Self-pay | Admitting: Hematology and Oncology

## 2015-01-07 NOTE — Telephone Encounter (Signed)
s.w. pt and advised on AUg appt....pt ok and aware

## 2015-02-01 ENCOUNTER — Other Ambulatory Visit: Payer: Self-pay

## 2015-02-01 DIAGNOSIS — C50412 Malignant neoplasm of upper-outer quadrant of left female breast: Secondary | ICD-10-CM

## 2015-02-02 ENCOUNTER — Ambulatory Visit (HOSPITAL_BASED_OUTPATIENT_CLINIC_OR_DEPARTMENT_OTHER): Payer: PRIVATE HEALTH INSURANCE | Admitting: Hematology and Oncology

## 2015-02-02 ENCOUNTER — Encounter: Payer: Self-pay | Admitting: Hematology and Oncology

## 2015-02-02 ENCOUNTER — Telehealth: Payer: Self-pay | Admitting: Hematology and Oncology

## 2015-02-02 ENCOUNTER — Other Ambulatory Visit (HOSPITAL_BASED_OUTPATIENT_CLINIC_OR_DEPARTMENT_OTHER): Payer: PRIVATE HEALTH INSURANCE

## 2015-02-02 VITALS — BP 121/85 | HR 74 | Temp 98.2°F | Resp 18 | Ht 63.0 in | Wt 236.8 lb

## 2015-02-02 DIAGNOSIS — C50412 Malignant neoplasm of upper-outer quadrant of left female breast: Secondary | ICD-10-CM

## 2015-02-02 DIAGNOSIS — R51 Headache: Secondary | ICD-10-CM | POA: Diagnosis not present

## 2015-02-02 DIAGNOSIS — L299 Pruritus, unspecified: Secondary | ICD-10-CM

## 2015-02-02 LAB — CBC WITH DIFFERENTIAL/PLATELET
BASO%: 0.5 % (ref 0.0–2.0)
Basophils Absolute: 0 10*3/uL (ref 0.0–0.1)
EOS ABS: 0.1 10*3/uL (ref 0.0–0.5)
EOS%: 1.7 % (ref 0.0–7.0)
HCT: 38.5 % (ref 34.8–46.6)
HEMOGLOBIN: 12.5 g/dL (ref 11.6–15.9)
LYMPH#: 1.2 10*3/uL (ref 0.9–3.3)
LYMPH%: 18.5 % (ref 14.0–49.7)
MCH: 26.8 pg (ref 25.1–34.0)
MCHC: 32.5 g/dL (ref 31.5–36.0)
MCV: 82.4 fL (ref 79.5–101.0)
MONO#: 0.5 10*3/uL (ref 0.1–0.9)
MONO%: 7.9 % (ref 0.0–14.0)
NEUT#: 4.5 10*3/uL (ref 1.5–6.5)
NEUT%: 71.4 % (ref 38.4–76.8)
Platelets: 298 10*3/uL (ref 145–400)
RBC: 4.67 10*6/uL (ref 3.70–5.45)
RDW: 14.5 % (ref 11.2–14.5)
WBC: 6.3 10*3/uL (ref 3.9–10.3)

## 2015-02-02 LAB — COMPREHENSIVE METABOLIC PANEL (CC13)
ALBUMIN: 3.7 g/dL (ref 3.5–5.0)
ALK PHOS: 102 U/L (ref 40–150)
ALT: 19 U/L (ref 0–55)
AST: 21 U/L (ref 5–34)
Anion Gap: 8 mEq/L (ref 3–11)
BUN: 8.6 mg/dL (ref 7.0–26.0)
CALCIUM: 9.3 mg/dL (ref 8.4–10.4)
CO2: 28 meq/L (ref 22–29)
CREATININE: 1 mg/dL (ref 0.6–1.1)
Chloride: 106 mEq/L (ref 98–109)
EGFR: 72 mL/min/{1.73_m2} — AB (ref >90)
GLUCOSE: 96 mg/dL (ref 70–140)
Potassium: 3.7 mEq/L (ref 3.5–5.1)
Sodium: 141 mEq/L (ref 136–145)
TOTAL PROTEIN: 7.2 g/dL (ref 6.4–8.3)
Total Bilirubin: 0.28 mg/dL (ref 0.20–1.20)

## 2015-02-02 MED ORDER — EXEMESTANE 25 MG PO TABS
25.0000 mg | ORAL_TABLET | Freq: Every day | ORAL | Status: DC
Start: 2015-02-02 — End: 2015-07-05

## 2015-02-02 NOTE — Assessment & Plan Note (Addendum)
Left breast lumpectomy: Invasive ductal carcinoma with DCIS, no LVI, 2 sentinel nodes negative, 1.3 cm, grade 1, 100%, PR 96 wasn't, HER-2 negative Ki-67 17% T1 cN0 M0 stage IA Oncotype DX recurrence score is 7, 6% ROR status post radiation completed May 2016, patient had hysterectomy with bilateral salpingo-oophorectomy 2011)  Current treatment: Anastrozole 1 mg daily started June 2016 Anastrozole toxicities:  1. Fatigue: It could be related to prior radiation therapy. After 5 hours through the day she feels tired. I encouraged her to participate in [facility] program. She does not have any hot flashes or myalgias. Occasional headaches. 2. Nausea if she takes anastrozole pill in the morning.  Return to clinic in 6 months for follow-up

## 2015-02-02 NOTE — Telephone Encounter (Signed)
Gave avs & calendar for November.  °

## 2015-02-02 NOTE — Progress Notes (Signed)
Patient Care Team: Alphonsa Overall, MD as Consulting Physician (General Surgery) Nicholas Lose, MD as Consulting Physician (Hematology and Oncology) Thea Silversmith, MD as Consulting Physician (Radiation Oncology) Rockwell Germany, RN as Registered Nurse Mauro Kaufmann, RN as Registered Nurse Holley Bouche, NP as Nurse Practitioner (Nurse Practitioner)  DIAGNOSIS: Breast cancer of upper-outer quadrant of left female breast   Staging form: Breast, AJCC 7th Edition     Clinical stage from 07/22/2014: Stage IA (T1c, N0, M0) - Unsigned     Pathologic: Stage IA (T1c, N0, cM0) - Unsigned   SUMMARY OF ONCOLOGIC HISTORY:   Breast cancer of upper-outer quadrant of left female breast   07/14/2014 Mammogram Left breast UOQ 2:00 position: 1.4 x 1.1 x 1.2 cm mass with indistinct, spiculated margins. Right breast negative.   07/14/2014 Breast US 1.5 cm hypoechoic, irregular mass with spiculated margins UOQ left breast at 2:00, 15 cm from nipple. Intramammary LN 1.4 cm superior to the mass (thin cortex) with another IM LN 3 cm inferior to the index mass with mild focal cortical thickening    07/14/2014 Initial Biopsy Left breast needle core biopsy (UOQ, 2:00 position): IDC with DCIS; grade 2, ER 100%, PR 96%, HER-2 negative ratio 1.17, Ki-67 17%;  Intramammary LN needle core biopsy (outer left breast, posterior 3:00 position): negative for malignancy.   07/24/2014 Breast MRI Left breast: 1.4 x 1.4 x 1.3 cm enhancing mass with irregular borders at 3 o'clock position. Right breast with no abnormal enhancement. No abnormal appearing LNs.    07/29/2014 Procedure Genetic counseling/testing: Breast/Ovarian cancer panel ([facility]) shows no clinically significant variants in ATM, BARD1, BRCA1, BRCA2, BRIP1, CDH1, CHEK2, EPCAM, FANCC, MLH1, MSH2, MSH6, NBN, PALB2, PMS2, PTEN, RAD51C, RAD51D, STK11, TP53, and XRCC2.     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 Negative margins. ER+ (100%), PR+ (96%), HER-2 repeated and negative by FISH.  Ki-67 17%.   08/06/2014 Clinical Stage Stage IA: T1c, N0 M0   08/06/2014 Oncotype testing Recurrence score: 7 (6% ROR).  No chemo (Trek Kimball).    09/16/2014 - 11/09/2014 Radiation Therapy Adjuvant RT completed Pablo Ledger). Left breast/ 50.4 Gy at 1.8 Gy per fraction x 28 fractions.  Left breast boost/ 10 Gy at 2 Gy per fraction x 5 fractions   11/25/2014 -  Anti-estrogen oral therapy Anastrozole 1 mg daily stopped due to headaches switched to exemestane 02/02/2015    CHIEF COMPLIANT: Itching of breast, headaches from Arimidex  INTERVAL HISTORY: [name] is a 39 yr old who was on arimidex but she stopped it due to headaches. She also complains of itching of the left breast. Denies any lumps or nodules.  REVIEW OF SYSTEMS:   Constitutional: Denies fevers, chills or abnormal weight loss Eyes: Denies blurriness of vision Ears, nose, mouth, throat, and face: Denies mucositis or sore throat Respiratory: Denies cough, dyspnea or wheezes Cardiovascular: Denies palpitation, chest discomfort or lower extremity swelling Gastrointestinal:  Denies nausea, heartburn or change in bowel habits Skin: Denies abnormal skin rashes Lymphatics: Denies new lymphadenopathy or easy bruising Neurological:Denies numbness, tingling or new weaknesses Behavioral/Psych: Mood is stable, no new changes  Breast: Itching left breasts All other systems were reviewed with the patient and are negative.  I have reviewed the past medical history, past surgical history, social history and family history with the patient and they are unchanged from previous note.  ALLERGIES:  has No Known  Allergies.  MEDICATIONS:  Current Outpatient Prescriptions  Medication Sig Dispense Refill  . exemestane (AROMASIN) 25 MG tablet Take 1 tablet (25 mg total) by mouth daily after breakfast. 30 tablet 0  .  prochlorperazine (COMPAZINE) 5 MG tablet Take 1 tablet (5 mg total) by mouth every 6 (six) hours as needed for nausea or vomiting. 30 tablet 0  . Wound Dressings (RADIAGEL EX) Apply 90 g topically.     No current facility-administered medications for this visit.    PHYSICAL EXAMINATION: ECOG PERFORMANCE STATUS: 1  Filed Vitals:   02/02/15 1523  BP: 121/85  Pulse: 74  Temp: 98.2 F (36.8 C)  Resp: 18   Filed Weights   02/02/15 1523  Weight: 236 lb 12.8 oz (107.412 kg)    GENERAL:alert, no distress and comfortable SKIN: skin color, texture, turgor are normal, no rashes or significant lesions EYES: normal, Conjunctiva are pink and non-injected, sclera clear OROPHARYNX:no exudate, no erythema and lips, buccal mucosa, and tongue normal  NECK: supple, thyroid normal size, non-tender, without nodularity LYMPH:  no palpable lymphadenopathy in the cervical, axillary or inguinal LUNGS: clear to auscultation and percussion with normal breathing effort HEART: regular rate & rhythm and no murmurs and no lower extremity edema ABDOMEN:abdomen soft, non-tender and normal bowel sounds Musculoskeletal:no cyanosis of digits and no clubbing  NEURO: alert & oriented x 3 with fluent speech, no focal motor/sensory deficits BREAST:Skin discoloration left breast. (exam performed in the presence of a chaperone)  LABORATORY DATA:  I have reviewed the data as listed   Chemistry      Component Value Date/Time   NA 141 02/02/2015 1508   NA 139 12/09/2014 1422   K 3.7 02/02/2015 1508   K 4.4 12/09/2014 1422   CL 105 12/09/2014 1422   CO2 28 02/02/2015 1508   CO2 28 12/09/2014 1422   BUN 8.6 02/02/2015 1508   BUN 8 12/09/2014 1422   CREATININE 1.0 02/02/2015 1508   CREATININE 0.91 12/09/2014 1422      Component Value Date/Time   CALCIUM 9.3 02/02/2015 1508   CALCIUM 9.3 12/09/2014 1422   ALKPHOS 102 02/02/2015 1508   ALKPHOS 86 12/09/2014 1422   AST 21 02/02/2015 1508   AST 22 12/09/2014  1422   ALT 19 02/02/2015 1508   ALT 18 12/09/2014 1422   BILITOT 0.28 02/02/2015 1508   BILITOT 0.4 12/09/2014 1422       Lab Results  Component Value Date   WBC 6.3 02/02/2015   HGB 12.5 02/02/2015   HCT 38.5 02/02/2015   MCV 82.4 02/02/2015   PLT 298 02/02/2015   NEUTROABS 4.5 02/02/2015   ASSESSMENT & PLAN:  Breast cancer of upper-outer quadrant of left female breast Left breast lumpectomy: Invasive ductal carcinoma with DCIS, no LVI, 2 sentinel nodes negative, 1.3 cm, grade 1, 100%, PR 96 wasn't, HER-2 negative Ki-67 17% T1 cN0 M0 stage IA Oncotype DX recurrence score is 7, 6% ROR status post radiation completed May 2016, patient had hysterectomy with bilateral salpingo-oophorectomy 2011)  Anastrozole 1 mg daily started June 2016 stopped July 2016 due to severe headaches  Current treatment: Exemestane 25 mg once daily to start 02/02/2015 Anastrozole toxicities:  1. Fatigue:  I encouraged her to participate in [facility] program. She does not have any hot flashes or myalgias. 2. Severe headaches: She stopped Arimidex  Left breast itching: Most likely related to prior radiation. I encouraged her to apply cortisone cream as well as hydrating lotions like calamine to soothe  the skin.  Return to clinic in 1 month for follow-up to assess tolerability to exemestane   No orders of the defined types were placed in this encounter.   The patient has a good understanding of the overall plan. she agrees with it. she will call with any problems that may develop before the next visit here.   Gudena, Vinay K, MD      

## 2015-02-03 ENCOUNTER — Encounter: Payer: Self-pay | Admitting: Adult Health

## 2015-02-03 NOTE — Progress Notes (Signed)
The Survivorship Care Plan was mailed to [name] as she reported not being able to come in to the Survivorship Clinic for an in-person visit at this time. A letter was mailed to her outlining the purpose of the content of the care plan, as well as encouraging her to reach out to me with any questions or concerns.  My business card was included in the correspondence to the patient as well.  The patient has not identified a PCP, so a copy was not routed to any primary care providers.   I will not be placing any follow-up appointments to the Survivorship Clinic for [name], but I am happy to see her at any time in the future for any survivorship concerns that may arise. Thank you for allowing me to participate in her care!  Mike Craze, NP [place] [phone]

## 2015-03-22 ENCOUNTER — Telehealth: Payer: Self-pay | Admitting: *Deleted

## 2015-03-22 NOTE — Assessment & Plan Note (Signed)
Left breast lumpectomy: Invasive ductal carcinoma with DCIS, no LVI, 2 sentinel nodes negative, 1.3 cm, grade 1, 100%, PR 96 wasn't, HER-2 negative Ki-67 17% T1 cN0 M0 stage IA Oncotype DX recurrence score is 7, 6% ROR status post radiation completed May 2016, patient had hysterectomy with bilateral salpingo-oophorectomy 2011) Anastrozole 1 mg daily started June 2016 stopped July 2016 due to severe headaches  Current treatment: Exemestane 25 mg once daily to start 02/02/2015 Anastrozole toxicities:  1. Fatigue:  I encouraged her to participate in [facility] program. She does not have any hot flashes or myalgias. 2. Severe headaches: She stopped Arimidex  Left breast itching: Most likely related to prior radiation. I encouraged her to apply cortisone cream as well as hydrating lotions like calamine to soothe the skin.  RTC in 6 months

## 2015-03-22 NOTE — Telephone Encounter (Signed)
Pt called stating that she is having pain in her breast and felt a knot and wants to see Dr. Lindi Adie.  Scheduled and confirmed 03/23/15 appt w/ pt.

## 2015-03-23 ENCOUNTER — Other Ambulatory Visit: Payer: Self-pay | Admitting: Hematology and Oncology

## 2015-03-23 ENCOUNTER — Ambulatory Visit (HOSPITAL_BASED_OUTPATIENT_CLINIC_OR_DEPARTMENT_OTHER): Payer: PRIVATE HEALTH INSURANCE | Admitting: Hematology and Oncology

## 2015-03-23 ENCOUNTER — Telehealth: Payer: Self-pay | Admitting: Hematology and Oncology

## 2015-03-23 ENCOUNTER — Encounter: Payer: Self-pay | Admitting: Hematology and Oncology

## 2015-03-23 VITALS — BP 114/60 | HR 56 | Temp 98.1°F | Resp 18 | Ht 63.0 in | Wt 239.0 lb

## 2015-03-23 DIAGNOSIS — F329 Major depressive disorder, single episode, unspecified: Secondary | ICD-10-CM | POA: Diagnosis not present

## 2015-03-23 DIAGNOSIS — C50412 Malignant neoplasm of upper-outer quadrant of left female breast: Secondary | ICD-10-CM

## 2015-03-23 DIAGNOSIS — F32A Depression, unspecified: Secondary | ICD-10-CM

## 2015-03-23 MED ORDER — VENLAFAXINE HCL ER 37.5 MG PO CP24: 37.5000 mg | ORAL_CAPSULE | Freq: Every day | ORAL | Status: DC

## 2015-03-23 NOTE — Progress Notes (Signed)
Patient Care Team: No Pcp Per Patient as PCP - General (General Practice) Alphonsa Overall, MD as Consulting Physician (General Surgery) Nicholas Lose, MD as Consulting Physician (Hematology and Oncology) Thea Silversmith, MD as Consulting Physician (Radiation Oncology) Rockwell Germany, RN as Registered Nurse Mauro Kaufmann, RN as Registered Nurse Holley Bouche, NP as Nurse Practitioner (Nurse Practitioner)  DIAGNOSIS: Breast cancer of upper-outer quadrant of left female breast   Staging form: Breast, AJCC 7th Edition     Clinical stage from 07/22/2014: Stage IA (T1c, N0, M0) - Unsigned     Pathologic: Stage IA (T1c, N0, cM0) - Unsigned   SUMMARY OF ONCOLOGIC HISTORY:   Breast cancer of upper-outer quadrant of left female breast   07/14/2014 Mammogram Left breast UOQ 2:00 position: 1.4 x 1.1 x 1.2 cm mass with indistinct, spiculated margins. Right breast negative.   07/14/2014 Breast US 1.5 cm hypoechoic, irregular mass with spiculated margins UOQ left breast at 2:00, 15 cm from nipple. Intramammary LN 1.4 cm superior to the mass (thin cortex) with another IM LN 3 cm inferior to the index mass with mild focal cortical thickening    07/14/2014 Initial Biopsy Left breast needle core biopsy (UOQ, 2:00 position): IDC with DCIS; grade 2, ER 100%, PR 96%, HER-2 negative ratio 1.17, Ki-67 17%;  Intramammary LN needle core biopsy (outer left breast, posterior 3:00 position): negative for malignancy.   07/24/2014 Breast MRI Left breast: 1.4 x 1.4 x 1.3 cm enhancing mass with irregular borders at 3 o'clock position. Right breast with no abnormal enhancement. No abnormal appearing LNs.    07/29/2014 Procedure Genetic counseling/testing: Breast/Ovarian cancer panel ([facility]) shows no clinically significant variants in ATM, BARD1, BRCA1, BRCA2, BRIP1, CDH1, CHEK2, EPCAM, FANCC, MLH1, MSH2, MSH6, NBN, PALB2, PMS2, PTEN, RAD51C, RAD51D, STK11, TP53, and XRCC2.     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 Negative margins. ER+ (100%), PR+ (96%), HER-2 repeated and negative by FISH.  Ki-67 17%.   08/06/2014 Clinical Stage Stage IA: T1c, N0 M0   08/06/2014 Oncotype testing Recurrence score: 7 (6% ROR).  No chemo (Gudena).    09/16/2014 - 11/09/2014 Radiation Therapy Adjuvant RT completed Pablo Ledger). Left breast/ 50.4 Gy at 1.8 Gy per fraction x 28 fractions.  Left breast boost/ 10 Gy at 2 Gy per fraction x 5 fractions   11/25/2014 -  Anti-estrogen oral therapy Anastrozole 1 mg daily stopped due to headaches switched to exemestane 02/02/2015. Planned duration of treatment 5 years.    CHIEF COMPLIANT:  Complains of lumpiness underneath the surgical scar  INTERVAL HISTORY: [name] is a  39 year old with above-mentioned history of left breast cancer treated with lumpectomy radiation and is now on antiestrogen therapy. We switched her to exemestane and she is tolerating it much better. She complains of soreness under the left axilla and at the site of the surgical excision. She also complains of a palpable lump underneath the surgical scar. She worries that this may be recurrent breast cancer.  REVIEW OF SYSTEMS:   Constitutional: Denies fevers, chills or abnormal weight loss Eyes: Denies blurriness of vision Ears, nose, mouth, throat, and face: Denies mucositis or sore throat Respiratory: Denies cough, dyspnea or wheezes Cardiovascular: Denies palpitation, chest discomfort or lower extremity swelling Gastrointestinal:  Denies nausea, heartburn or change in bowel habits Skin: Denies abnormal skin rashes Lymphatics: Denies new lymphadenopathy or easy bruising Neurological:Denies numbness, tingling or  new weaknesses Behavioral/Psych: Mood is stable, no new changes  Breast:  Left breast discomfort with the palpable nodularity underneath the breast scar All other systems were reviewed with  the patient and are negative.  I have reviewed the past medical history, past surgical history, social history and family history with the patient and they are unchanged from previous note.  ALLERGIES:  has No Known Allergies.  MEDICATIONS:  Current Outpatient Prescriptions  Medication Sig Dispense Refill  . exemestane (AROMASIN) 25 MG tablet Take 1 tablet (25 mg total) by mouth daily after breakfast. 30 tablet 0  . prochlorperazine (COMPAZINE) 5 MG tablet Take 1 tablet (5 mg total) by mouth every 6 (six) hours as needed for nausea or vomiting. 30 tablet 0  . Wound Dressings (RADIAGEL EX) Apply 90 g topically.    . venlafaxine XR (EFFEXOR-XR) 37.5 MG 24 hr capsule Take 1 capsule (37.5 mg total) by mouth daily with breakfast. 30 capsule 3   No current facility-administered medications for this visit.    PHYSICAL EXAMINATION: ECOG PERFORMANCE STATUS: 1 - Symptomatic but completely ambulatory  Filed Vitals:   03/23/15 0850  BP: 114/60  Pulse: 56  Temp: 98.1 F (36.7 C)  Resp: 18   Filed Weights   03/23/15 0850  Weight: 239 lb (108.41 kg)    GENERAL:alert, no distress and comfortable SKIN: skin color, texture, turgor are normal, no rashes or significant lesions EYES: normal, Conjunctiva are pink and non-injected, sclera clear OROPHARYNX:no exudate, no erythema and lips, buccal mucosa, and tongue normal  NECK: supple, thyroid normal size, non-tender, without nodularity LYMPH:  no palpable lymphadenopathy in the cervical, axillary or inguinal LUNGS: clear to auscultation and percussion with normal breathing effort HEART: regular rate & rhythm and no murmurs and no lower extremity edema ABDOMEN:abdomen soft, non-tender and normal bowel sounds Musculoskeletal:no cyanosis of digits and no clubbing  NEURO: alert & oriented x 3 with fluent speech, no focal motor/sensory deficits BREAST: palpable nodularity underneath the left breast scar which I suspect is scar tissue but patient is  very concerned about it. (exam performed in the presence of a chaperone)  LABORATORY DATA:  I have reviewed the data as listed   Chemistry      Component Value Date/Time   NA 141 02/02/2015 1508   NA 139 12/09/2014 1422   K 3.7 02/02/2015 1508   K 4.4 12/09/2014 1422   CL 105 12/09/2014 1422   CO2 28 02/02/2015 1508   CO2 28 12/09/2014 1422   BUN 8.6 02/02/2015 1508   BUN 8 12/09/2014 1422   CREATININE 1.0 02/02/2015 1508   CREATININE 0.91 12/09/2014 1422      Component Value Date/Time   CALCIUM 9.3 02/02/2015 1508   CALCIUM 9.3 12/09/2014 1422   ALKPHOS 102 02/02/2015 1508   ALKPHOS 86 12/09/2014 1422   AST 21 02/02/2015 1508   AST 22 12/09/2014 1422   ALT 19 02/02/2015 1508   ALT 18 12/09/2014 1422   BILITOT 0.28 02/02/2015 1508   BILITOT 0.4 12/09/2014 1422       Lab Results  Component Value Date   WBC 6.3 02/02/2015   HGB 12.5 02/02/2015   HCT 38.5 02/02/2015   MCV 82.4 02/02/2015   PLT 298 02/02/2015   NEUTROABS 4.5 02/02/2015    ASSESSMENT & PLAN:  Breast cancer of upper-outer quadrant of left female breast Left breast lumpectomy: Invasive ductal carcinoma with DCIS, no LVI, 2 sentinel nodes negative, 1.3 cm, grade 1, 100%, PR 96 wasn't,  HER-2 negative Ki-67 17% T1 cN0 M0 stage IA Oncotype DX recurrence score is 7, 6% ROR status post radiation completed May 2016, patient had hysterectomy with bilateral salpingo-oophorectomy 2011) Anastrozole 1 mg daily started June 2016 stopped July 2016 due to severe headaches  Current treatment: Exemestane 25 mg once daily to start 02/02/2015 Anastrozole toxicities:  1. Fatigue:  I encouraged her to participate in [facility] program. She does not have any hot flashes or myalgias. 2. Severe headaches: She stopped Arimidex  Left breast itching: Most likely related to prior radiation. I encouraged her to apply cortisone cream as well as hydrating lotions like calamine to soothe the skin.   left breast lump: underneath the  surgical scar is most likely scar tissue. However because of her patient concerns or would like to plan another mammogram in the left breast  And an ultrasound if needed.  RTC in 6 months   Orders Placed This Encounter  Procedures  . MM Digital Diagnostic Unilat L    03/23/15-requested co sign/gmd palpable mass underneath the scar left breast, patient concerned for recurrence/pt has lt lumpectomy 4 mo ago/no implants Pf 07/09/14 diag mammo bcg/no needs/gmd/amber [phone]/medcost/epic order     Standing Status: Future     Number of Occurrences:      Standing Expiration Date: 03/22/2016    Order Specific Question:  Reason for Exam (SYMPTOM  OR DIAGNOSIS REQUIRED)    Answer:  palpable mass underneath the scar left breast, patient concerned for recurrence    Order Specific Question:  Is the patient pregnant?    Answer:  No    Order Specific Question:  Preferred imaging location?    Answer:  [facility]   The patient has a good understanding of the overall plan. she agrees with it. she will call with any problems that may develop before the next visit here.   Rulon Eisenmenger, MD

## 2015-03-23 NOTE — Telephone Encounter (Signed)
Gave avs & calendar for September & December. °

## 2015-03-26 ENCOUNTER — Other Ambulatory Visit: Payer: Self-pay | Admitting: Hematology and Oncology

## 2015-03-26 ENCOUNTER — Ambulatory Visit
Admission: RE | Admit: 2015-03-26 | Discharge: 2015-03-26 | Disposition: A | Discharge status: Home / Self Care | Payer: PRIVATE HEALTH INSURANCE | Source: Ambulatory Visit | Attending: Hematology and Oncology | Admitting: Hematology and Oncology

## 2015-03-26 DIAGNOSIS — C50412 Malignant neoplasm of upper-outer quadrant of left female breast: Secondary | ICD-10-CM

## 2015-05-04 ENCOUNTER — Ambulatory Visit: Payer: PRIVATE HEALTH INSURANCE | Admitting: Hematology and Oncology

## 2015-06-22 ENCOUNTER — Ambulatory Visit: Payer: PRIVATE HEALTH INSURANCE | Admitting: Hematology and Oncology

## 2015-07-05 ENCOUNTER — Encounter: Payer: Self-pay | Admitting: Hematology and Oncology

## 2015-07-05 ENCOUNTER — Ambulatory Visit (HOSPITAL_BASED_OUTPATIENT_CLINIC_OR_DEPARTMENT_OTHER): Payer: PRIVATE HEALTH INSURANCE | Admitting: Hematology and Oncology

## 2015-07-05 VITALS — BP 133/71 | HR 70 | Temp 98.4°F | Resp 18 | Wt 244.4 lb

## 2015-07-05 DIAGNOSIS — F419 Anxiety disorder, unspecified: Secondary | ICD-10-CM

## 2015-07-05 DIAGNOSIS — C50412 Malignant neoplasm of upper-outer quadrant of left female breast: Secondary | ICD-10-CM | POA: Diagnosis not present

## 2015-07-05 MED ORDER — EXEMESTANE 25 MG PO TABS: 25.0000 mg | ORAL_TABLET | Freq: Every day | ORAL | Status: DC

## 2015-07-05 MED ORDER — ALPRAZOLAM 0.25 MG PO TABS: 0.2500 mg | ORAL_TABLET | Freq: Every evening | ORAL | Status: DC | PRN

## 2015-07-05 NOTE — Assessment & Plan Note (Signed)
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   Left breast itching: Most likely related to prior radiation. I encouraged her to apply cortisone cream as well as hydrating lotions like calamine to soothe the skin.  Left breast lump: underneath the surgical scar is most likely scar tissue. Mammogram and ultrasound 03/26/2015 left breast: No evidence of malignancy. Site of palpable concern corresponds to skin edema.  Return to clinic in 6 months for follow-up.

## 2015-07-05 NOTE — Addendum Note (Signed)
Addended by: Prentiss Bells on: 07/05/2015 05:26 PM   Modules accepted: Orders, Medications

## 2015-07-05 NOTE — Progress Notes (Signed)
Patient Care Team: No Pcp Per Patient as PCP - General (General Practice) Alphonsa Overall, MD as Consulting Physician (General Surgery) Nicholas Lose, MD as Consulting Physician (Hematology and Oncology) Thea Silversmith, MD as Consulting Physician (Radiation Oncology) Rockwell Germany, RN as Registered Nurse Mauro Kaufmann, RN as Registered Nurse Holley Bouche, NP as Nurse Practitioner (Nurse Practitioner)  DIAGNOSIS: Breast cancer of upper-outer quadrant of left female breast [facility])   Staging form: Breast, AJCC 7th Edition     Clinical stage from 07/22/2014: Stage IA (T1c, N0, M0) - Unsigned     Pathologic: Stage IA (T1c, N0, cM0) - Unsigned   SUMMARY OF ONCOLOGIC HISTORY:   Breast cancer of upper-outer quadrant of left female breast ([place])   07/14/2014 Mammogram Left breast UOQ 2:00 position: 1.4 x 1.1 x 1.2 cm mass with indistinct, spiculated margins. Right breast negative.   07/14/2014 Breast US 1.5 cm hypoechoic, irregular mass with spiculated margins UOQ left breast at 2:00, 15 cm from nipple. Intramammary LN 1.4 cm superior to the mass (thin cortex) with another IM LN 3 cm inferior to the index mass with mild focal cortical thickening    07/14/2014 Initial Biopsy Left breast needle core biopsy (UOQ, 2:00 position): IDC with DCIS; grade 2, ER 100%, PR 96%, HER-2 negative ratio 1.17, Ki-67 17%;  Intramammary LN needle core biopsy (outer left breast, posterior 3:00 position): negative for malignancy.   07/24/2014 Breast MRI Left breast: 1.4 x 1.4 x 1.3 cm enhancing mass with irregular borders at 3 o'clock position. Right breast with no abnormal enhancement. No abnormal appearing LNs.    07/29/2014 Procedure Genetic counseling/testing: Breast/Ovarian cancer panel ([facility]) shows no clinically significant variants in ATM, BARD1, BRCA1, BRCA2, BRIP1, CDH1, CHEK2, EPCAM, FANCC, MLH1, MSH2, MSH6, NBN, PALB2, PMS2, PTEN, RAD51C, RAD51D, STK11, TP53, and XRCC2.     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 Negative margins. ER+ (100%), PR+ (96%), HER-2 repeated and negative by FISH.  Ki-67 17%.   08/06/2014 Clinical Stage Stage IA: T1c, N0 M0   08/06/2014 Oncotype testing Recurrence score: 7 (6% ROR).  No chemo ([place] [facility]).    09/16/2014 - 11/09/2014 Radiation Therapy Adjuvant RT completed Pablo Ledger). Left breast/ 50.4 Gy at 1.8 Gy per fraction x 28 fractions.  Left breast boost/ 10 Gy at 2 Gy per fraction x 5 fractions   11/25/2014 -  Anti-estrogen oral therapy Anastrozole 1 mg daily stopped due to headaches switched to exemestane 02/02/2015. Planned duration of treatment 5 years.    CHIEF COMPLIANT: follow-up on exemestane  INTERVAL HISTORY: AVIELLA DISBROW is a 40 year old with above-mentioned history of left breast cancer treated with lumpectomy and radiation and is currently on antiestrogen therapy. She could not tolerate anastrozole because of headaches and was switched to exemestane. She is here today to discuss the side effects of treatment with exemestane. She reports that she does not have any more headaches. But she feels very tired and weak. She also does not have appetite.  REVIEW OF SYSTEMS:   Constitutional: Denies fevers, chills or abnormal weight loss, fatigue Eyes: Denies blurriness of vision Ears, nose, mouth, throat, and face: Denies mucositis or sore throat Respiratory: Denies cough, dyspnea or wheezes Cardiovascular: Denies palpitation, chest discomfort Gastrointestinal:  Decreased appetite Skin: Denies abnormal skin rashes Lymphatics: Denies new lymphadenopathy or easy bruising Neurological:Denies numbness, tingling or new weaknesses Behavioral/Psych: Mood is stable, no new changes  Extremities: No lower extremity edema Breast:  denies any pain or lumps or nodules in either breasts All other systems were reviewed with the patient and are  negative.  I have reviewed the past medical history, past surgical history, social history and family history with the patient and they are unchanged from previous note.  ALLERGIES:  has No Known Allergies.  MEDICATIONS:  Current Outpatient Prescriptions  Medication Sig Dispense Refill  . ALPRAZolam (XANAX) 0.25 MG tablet Take 1 tablet (0.25 mg total) by mouth at bedtime as needed for anxiety. 45 tablet 0  . exemestane (AROMASIN) 25 MG tablet Take 1 tablet (25 mg total) by mouth daily after breakfast. 90 tablet 3  . prochlorperazine (COMPAZINE) 5 MG tablet Take 1 tablet (5 mg total) by mouth every 6 (six) hours as needed for nausea or vomiting. 30 tablet 0  . Wound Dressings (RADIAGEL EX) Apply 90 g topically.     No current facility-administered medications for this visit.    PHYSICAL EXAMINATION: ECOG PERFORMANCE STATUS: 1 - Symptomatic but completely ambulatory  Filed Vitals:   07/05/15 1524  BP: 133/71  Pulse: 70  Temp: 98.4 F (36.9 C)  Resp: 18   Filed Weights   07/05/15 1524  Weight: 244 lb 6.4 oz (110.859 kg)    GENERAL:alert, no distress and comfortable SKIN: skin color, texture, turgor are normal, no rashes or significant lesions EYES: normal, Conjunctiva are pink and non-injected, sclera clear OROPHARYNX:no exudate, no erythema and lips, buccal mucosa, and tongue normal  NECK: supple, thyroid normal size, non-tender, without nodularity LYMPH:  no palpable lymphadenopathy in the cervical, axillary or inguinal LUNGS: clear to auscultation and percussion with normal breathing effort HEART: regular rate & rhythm and no murmurs and no lower extremity edema ABDOMEN:abdomen soft, non-tender and normal bowel sounds MUSCULOSKELETAL:no cyanosis of digits and no clubbing  NEURO: alert & oriented x 3 with fluent speech, no focal motor/sensory deficits EXTREMITIES: No lower extremity edema  LABORATORY DATA:  I have reviewed the data as listed   Chemistry      Component  Value Date/Time   NA 141 02/02/2015 1508   NA 139 12/09/2014 1422   K 3.7 02/02/2015 1508   K 4.4 12/09/2014 1422   CL 105 12/09/2014 1422   CO2 28 02/02/2015 1508   CO2 28 12/09/2014 1422   BUN 8.6 02/02/2015 1508   BUN 8 12/09/2014 1422   CREATININE 1.0 02/02/2015 1508   CREATININE 0.91 12/09/2014 1422      Component Value Date/Time   CALCIUM 9.3 02/02/2015 1508   CALCIUM 9.3 12/09/2014 1422   ALKPHOS 102 02/02/2015 1508   ALKPHOS 86 12/09/2014 1422   AST 21 02/02/2015 1508   AST 22 12/09/2014 1422   ALT 19 02/02/2015 1508   ALT 18 12/09/2014 1422   BILITOT 0.28 02/02/2015 1508   BILITOT 0.4 12/09/2014 1422       Lab Results  Component Value Date   WBC 6.3 02/02/2015   HGB 12.5 02/02/2015   HCT 38.5 02/02/2015   MCV 82.4 02/02/2015   PLT 298 02/02/2015   NEUTROABS 4.5 02/02/2015   ASSESSMENT & PLAN:  Breast cancer of upper-outer quadrant of left female breast ([facility]) Left breast lumpectomy: Invasive ductal carcinoma with DCIS, no LVI, 2 sentinel nodes negative, 1.3 cm, grade 1, 100%, PR 96 wasn't, HER-2 negative Ki-67 17% T1 cN0 M0 stage IA Oncotype DX recurrence score is 7, 6% ROR status post radiation completed May 2016, patient had hysterectomy with bilateral  salpingo-oophorectomy 2011) Anastrozole 1 mg daily started June 2016 stopped July 2016 due to severe headaches  Current treatment: Exemestane 25 mg once daily started 02/02/2015 Exemestane toxicities:  1. Fatigue She does not have any significant hot flashes or myalgias. Oophorectomy was over 6 years ago. She does not have any more headaches but she does not have enough energy to do whole lot of activity. She needs to rest after every few hours.  Recommendation: Join the [employer] live strong program as well as [facility] program.  Severe anxiety: I prescribed her Xanax and we will consult psychiatry.  Return to clinic in 6 months for follow-up.  Orders Placed This Encounter  Procedures  . Ambulatory referral to  Psychiatry    Referral Priority:  Routine    Referral Type:  Psychiatric    Referral Reason:  Specialty Services Required    Requested Specialty:  Psychiatry    Number of Visits Requested:  1   The patient has a good understanding of the overall plan. she agrees with it. she will call with any problems that may develop before the next visit here.   Rulon Eisenmenger, MD 07/05/2015

## 2015-07-06 ENCOUNTER — Telehealth: Payer: Self-pay | Admitting: Hematology and Oncology

## 2015-07-06 NOTE — Telephone Encounter (Signed)
Called and left a message with a 6 month follow up and to call any therap/councel for her anx due to intake questions    anne

## 2015-07-24 ENCOUNTER — Emergency Department (HOSPITAL_COMMUNITY)
Admission: EM | Admit: 2015-07-24 | Discharge: 2015-07-25 | Disposition: A | Discharge status: Home / Self Care | Payer: PRIVATE HEALTH INSURANCE | Attending: Emergency Medicine | Admitting: Emergency Medicine

## 2015-07-24 ENCOUNTER — Encounter (HOSPITAL_COMMUNITY): Payer: Self-pay | Admitting: Emergency Medicine

## 2015-07-24 DIAGNOSIS — Z8659 Personal history of other mental and behavioral disorders: Secondary | ICD-10-CM | POA: Diagnosis not present

## 2015-07-24 DIAGNOSIS — R011 Cardiac murmur, unspecified: Secondary | ICD-10-CM | POA: Insufficient documentation

## 2015-07-24 DIAGNOSIS — Z853 Personal history of malignant neoplasm of breast: Secondary | ICD-10-CM | POA: Insufficient documentation

## 2015-07-24 DIAGNOSIS — J40 Bronchitis, not specified as acute or chronic: Secondary | ICD-10-CM

## 2015-07-24 DIAGNOSIS — J45909 Unspecified asthma, uncomplicated: Secondary | ICD-10-CM | POA: Diagnosis not present

## 2015-07-24 DIAGNOSIS — R05 Cough: Secondary | ICD-10-CM

## 2015-07-24 DIAGNOSIS — Z79899 Other long term (current) drug therapy: Secondary | ICD-10-CM | POA: Diagnosis not present

## 2015-07-24 DIAGNOSIS — R058 Other specified cough: Secondary | ICD-10-CM

## 2015-07-24 NOTE — ED Notes (Signed)
Pt. reports productive cough onset 3 days ago unrelieved by OTC antitussive , denies fever or chills.

## 2015-07-24 NOTE — ED Provider Notes (Signed)
CSN: [id number]     Arrival date & time 07/24/15  2343 History  By signing my name below, I, Hansel Feinstein, attest that this documentation has been prepared under the direction and in the presence of Shawn Joy, PA-C. Electronically Signed: Hansel Feinstein, ED Scribe. 07/25/2015. 12:03 AM.    Chief Complaint  Patient presents with  . Cough   The history is provided by the patient. No language interpreter was used.   HPI Comments: [name] is a 40 y.o. female who presents to the Emergency Department complaining of moderate, intermittent productive cough with yellow/green phlegm for 3 days with associated chest congestion, nasal congestion, rhinorrhea with yellow/green phlegm for a week. Pt states she has tried nyquil, robitussin and other OTC cold and flu medications PTA with no relief. Pt denies having any sick contacts with similar symptoms. She does note that she is seen at the cancer center every other week for breast cancer, and has had her last radiation treatment one month ago and is now cancer free. She denies fever, emesis, nausea, chest pain, abdominal pain, SOB, or any other complaints.    Past Medical History  Diagnosis Date  . History of seizures     during pregnancies; last seizure 3 yrs. ago  . Stress headaches   . Asthma     no current med.  . Breast cancer of upper-outer quadrant of left female breast ([place]) 07/16/2014  . Heart murmur     as a child   Past Surgical History  Procedure Laterality Date  . Cesarean section  [id number]  . Tympanostomy tube placement    . Total abdominal hysterectomy w/ bilateral salpingoophorectomy  12/20/2009  . Hysteroscopy w/ endometrial ablation  11/11/2007  . Lysis of adhesion  12/20/2009  . Cysto  12/20/2009  . Cesarean section with bilateral tubal ligation  03/03/2003   Family History  Problem Relation Age of Onset  . Hypertension Mother   . Breast cancer Other     MGM's sister  . Breast cancer Maternal Grandmother     dx in her 65s    Social History  Substance Use Topics  . Smoking status: Never Smoker   . Smokeless tobacco: Never Used  . Alcohol Use: No   OB History    No data available     Review of Systems  Constitutional: Negative for fever and chills.  HENT: Positive for congestion and rhinorrhea.   Respiratory: Positive for cough. Negative for shortness of breath.   Gastrointestinal: Negative for nausea, vomiting and abdominal pain.  All other systems reviewed and are negative.  Allergies  Review of patient's allergies indicates no known allergies.  Home Medications   Prior to Admission medications   Medication Sig Start Date End Date Taking? Authorizing Provider  ALPRAZolam (XANAX) 0.25 MG tablet Take 1 tablet (0.25 mg total) by mouth at bedtime as needed for anxiety. 07/05/15   Nicholas Lose, MD  benzonatate (TESSALON) 100 MG capsule Take 1 capsule (100 mg total) by mouth every 8 (eight) hours. 07/25/15   Shawn C Joy, PA-C  chlorpheniramine-HYDROcodone (TUSSIONEX PENNKINETIC ER) 10-8 MG/5ML SUER Take 5 mLs by mouth at bedtime as needed for cough. 07/25/15   Shawn C Joy, PA-C  exemestane (AROMASIN) 25 MG tablet Take 1 tablet (25 mg total) by mouth daily after breakfast. 07/05/15   Nicholas Lose, MD  ibuprofen (ADVIL,MOTRIN) 800 MG tablet Take 1 tablet (800 mg total) by mouth 3 (three) times daily. 07/25/15   Lorayne Bender,  PA-C  prochlorperazine (COMPAZINE) 5 MG tablet Take 1 tablet (5 mg total) by mouth every 6 (six) hours as needed for nausea or vomiting. 12/02/14   Chauncey Cruel, MD   BP 116/59 mmHg  Pulse 61  Temp(Src) 97.6 F (36.4 C) (Oral)  Resp 18  Wt 111.902 kg  SpO2 97% Physical Exam  Constitutional: She appears well-developed and well-nourished.  HENT:  Head: Normocephalic and atraumatic.  Cardiovascular: Normal rate, regular rhythm and normal heart sounds.  Exam reveals no gallop and no friction rub.   No murmur heard. Pulmonary/Chest: Effort normal and breath sounds normal. No respiratory  distress. She has no wheezes. She has no rales.  Lungs CTA bilaterally. No increased work of breathing. Speaks in full sentences.   Musculoskeletal: Normal range of motion.  Neurological: She is alert.  Skin: Skin is warm and dry.  Psychiatric: She has a normal mood and affect. Her behavior is normal.  Nursing note and vitals reviewed.   ED Course  Procedures (including critical care time) DIAGNOSTIC STUDIES: Oxygen Saturation is 97% on RA, normal by my interpretation.    COORDINATION OF CARE: 11:59 PM Discussed treatment plan with pt at bedside which includes CXR and pt agreed to plan.   Imaging Review Dg Chest 2 View  07/25/2015  CLINICAL DATA:  Acute onset of productive cough. Patient on radiation therapy for breast cancer. Initial encounter. EXAM: CHEST  2 VIEW COMPARISON:  Chest radiograph performed 10/29/2013 FINDINGS: The lungs are well-aerated and clear. There is no evidence of focal opacification, pleural effusion or pneumothorax. The heart is normal in size; the mediastinal contour is within normal limits. No acute osseous abnormalities are seen. IMPRESSION: No acute cardiopulmonary process seen. Electronically Signed   By: Garald Balding M.D.   On: 07/25/2015 00:53   I have personally reviewed and evaluated these images as part of my medical decision-making.   MDM   Final diagnoses:  Productive cough  Bronchitis    [name] presents to the ED for evaluation of productive cough with yellow/green phlegm for 3 days with associated chest and nasal congestion, rhinorrhea. Pt symptoms consistent with URI or viral bronchitis. CXR negative for acute infiltrate. Pt will be discharged with symptomatic treatment including tessalon, followed by tussionex if symptoms persist.  Discussed return precautions.  Advised pt to follow up with PCP if symptoms persist. Pt is hemodynamically stable & in NAD prior to discharge. Pt is agreeable to the plan.   Filed Vitals:   07/24/15 2347   BP: 116/59  Pulse: 61  Temp: 97.6 F (36.4 C)  TempSrc: Oral  Resp: 18  Weight: 111.902 kg  SpO2: 97%    I personally performed the services described in this documentation, which was scribed in my presence. The recorded information has been reviewed and is accurate.    Lorayne Bender, PA-C 07/25/15 0113  Merryl Hacker, MD 07/26/15 [phone]

## 2015-07-25 ENCOUNTER — Emergency Department (HOSPITAL_COMMUNITY): Payer: PRIVATE HEALTH INSURANCE

## 2015-07-25 MED ORDER — IBUPROFEN 800 MG PO TABS: 800.0000 mg | ORAL_TABLET | Freq: Three times a day (TID) | ORAL | Status: DC

## 2015-07-25 MED ORDER — HYDROCOD POLST-CPM POLST ER 10-8 MG/5ML PO SUER
5.0000 mL | Freq: Once | ORAL | Status: AC
  Administered 2015-07-25: 5 mL via ORAL
  Filled 2015-07-25: qty 5

## 2015-07-25 MED ORDER — BENZONATATE 100 MG PO CAPS: 100.0000 mg | ORAL_CAPSULE | Freq: Three times a day (TID) | ORAL | Status: DC

## 2015-07-25 MED ORDER — HYDROCOD POLST-CPM POLST ER 10-8 MG/5ML PO SUER: 5.0000 mL | Freq: Every evening | ORAL | Status: DC | PRN

## 2015-07-25 NOTE — Discharge Instructions (Signed)
You have been seen today for a cough. Your imaging showed no abnormalities. The most likely cause of your illness is a virus. Viruses do not require antibiotics. Treatment is supportive care. Drink plenty of fluids and get plenty of rest. Follow up with PCP as needed. Return to ED should symptoms worsen. Try the Tessalon pearls first and only if these do not work move onto the Tussionex. Tylenol or ibuprofen for pain or fever.

## 2015-07-25 NOTE — ED Notes (Signed)
Pt left at this time with all belongings.  

## 2015-08-10 ENCOUNTER — Ambulatory Visit (HOSPITAL_BASED_OUTPATIENT_CLINIC_OR_DEPARTMENT_OTHER): Payer: PRIVATE HEALTH INSURANCE | Admitting: Hematology and Oncology

## 2015-08-10 ENCOUNTER — Other Ambulatory Visit: Payer: Self-pay | Admitting: Hematology and Oncology

## 2015-08-10 ENCOUNTER — Encounter: Payer: Self-pay | Admitting: Hematology and Oncology

## 2015-08-10 VITALS — BP 119/60 | HR 67 | Temp 97.8°F | Resp 19 | Wt 245.9 lb

## 2015-08-10 DIAGNOSIS — F411 Generalized anxiety disorder: Secondary | ICD-10-CM | POA: Diagnosis not present

## 2015-08-10 DIAGNOSIS — R599 Enlarged lymph nodes, unspecified: Secondary | ICD-10-CM | POA: Diagnosis not present

## 2015-08-10 DIAGNOSIS — C50412 Malignant neoplasm of upper-outer quadrant of left female breast: Secondary | ICD-10-CM | POA: Diagnosis not present

## 2015-08-10 NOTE — Assessment & Plan Note (Signed)
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 Fatigue She does not have any significant hot flashes or myalgias. Oophorectomy was over 6 years ago. Recommendation: Join the [employer] live strong program as well as [facility] program.  Breast Lump:  Severe anxiety: I prescribed her Xanax and we will consult psychiatry.  Return to clinic in 6 months for follow-up.

## 2015-08-10 NOTE — Progress Notes (Signed)
Patient Care Team: No Pcp Per Patient as PCP - General (General Practice) Alphonsa Overall, MD as Consulting Physician (General Surgery) Nicholas Lose, MD as Consulting Physician (Hematology and Oncology) Thea Silversmith, MD as Consulting Physician (Radiation Oncology) Rockwell Germany, RN as Registered Nurse Mauro Kaufmann, RN as Registered Nurse Holley Bouche, NP as Nurse Practitioner (Nurse Practitioner)  DIAGNOSIS: Breast cancer of upper-outer quadrant of left female breast [facility])   Staging form: Breast, AJCC 7th Edition     Clinical stage from 07/22/2014: Stage IA (T1c, N0, M0) - Unsigned     Pathologic: Stage IA (T1c, N0, cM0) - Unsigned  SUMMARY OF ONCOLOGIC HISTORY:   Breast cancer of upper-outer quadrant of left female breast ([place])   07/14/2014 Mammogram Left breast UOQ 2:00 position: 1.4 x 1.1 x 1.2 cm mass with indistinct, spiculated margins. Right breast negative.   07/14/2014 Breast US 1.5 cm hypoechoic, irregular mass with spiculated margins UOQ left breast at 2:00, 15 cm from nipple. Intramammary LN 1.4 cm superior to the mass (thin cortex) with another IM LN 3 cm inferior to the index mass with mild focal cortical thickening    07/14/2014 Initial Biopsy Left breast needle core biopsy (UOQ, 2:00 position): IDC with DCIS; grade 2, ER 100%, PR 96%, HER-2 negative ratio 1.17, Ki-67 17%;  Intramammary LN needle core biopsy (outer left breast, posterior 3:00 position): negative for malignancy.   07/24/2014 Breast MRI Left breast: 1.4 x 1.4 x 1.3 cm enhancing mass with irregular borders at 3 o'clock position. Right breast with no abnormal enhancement. No abnormal appearing LNs.    07/29/2014 Procedure Genetic counseling/testing: Breast/Ovarian cancer panel ([facility]) shows no clinically significant variants in ATM, BARD1, BRCA1, BRCA2, BRIP1, CDH1, CHEK2, EPCAM, FANCC, MLH1, MSH2, MSH6, NBN, PALB2, PMS2, PTEN, RAD51C, RAD51D, STK11, TP53, and XRCC2.     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 Negative margins. ER+ (100%), PR+ (96%), HER-2 repeated and negative by FISH.  Ki-67 17%.   08/06/2014 Clinical Stage Stage IA: T1c, N0 M0   08/06/2014 Oncotype testing Recurrence score: 7 (6% ROR).  No chemo (Penny Frisbie).    09/16/2014 - 11/09/2014 Radiation Therapy Adjuvant RT completed Pablo Ledger). Left breast/ 50.4 Gy at 1.8 Gy per fraction x 28 fractions.  Left breast boost/ 10 Gy at 2 Gy per fraction x 5 fractions   11/25/2014 -  Anti-estrogen oral therapy Anastrozole 1 mg daily stopped due to headaches switched to exemestane 02/02/2015. Planned duration of treatment 5 years.    CHIEF COMPLIANT: Left axillary pain and swelling  INTERVAL HISTORY: [name] is a 40 year old with above-mentioned history of left breast cancer treated with lumpectomy followed by adjuvant radiation and is currently on antiestrogen therapy with anastrozole. She started noticing since yesterday pain and discomfort in the left axilla along with a swollen feeling. She was unable to lift her left arm above the shoulder. Because of this she made an urgent appointment to see [place] today. She had used hot packs as well as cold packs and this seemed to improve it certainly. She has not lifted any heavy weights. He is going to the [employer] live strong program and spends most of the time on treadmills. There is no clear aggravating or relieving factors. Pain comes on when she lifts her arm above the shoulder. The breasts also feel slightly uncomfortable with the burning sensation in the left breast.  REVIEW  OF SYSTEMS:   Constitutional: Denies fevers, chills or abnormal weight loss Eyes: Denies blurriness of vision Ears, nose, mouth, throat, and face: Denies mucositis or sore throat Respiratory: Denies cough, dyspnea or wheezes Cardiovascular: Denies palpitation, chest discomfort Gastrointestinal:  Denies  nausea, heartburn or change in bowel habits Skin: Denies abnormal skin rashes Lymphatics: Denies new lymphadenopathy or easy bruising Neurological:Denies numbness, tingling or new weaknesses Behavioral/Psych: Mood is stable, no new changes  Extremities: No lower extremity edema Breast: Left axillary and left breast discomfort All other systems were reviewed with the patient and are negative.  I have reviewed the past medical history, past surgical history, social history and family history with the patient and they are unchanged from previous note.  ALLERGIES:  has No Known Allergies.  MEDICATIONS:  Current Outpatient Prescriptions  Medication Sig Dispense Refill  . ALPRAZolam (XANAX) 0.25 MG tablet Take 1 tablet (0.25 mg total) by mouth at bedtime as needed for anxiety. 45 tablet 0  . benzonatate (TESSALON) 100 MG capsule Take 1 capsule (100 mg total) by mouth every 8 (eight) hours. 21 capsule 0  . chlorpheniramine-HYDROcodone (TUSSIONEX PENNKINETIC ER) 10-8 MG/5ML SUER Take 5 mLs by mouth at bedtime as needed for cough. 140 mL 0  . exemestane (AROMASIN) 25 MG tablet Take 1 tablet (25 mg total) by mouth daily after breakfast. 90 tablet 3  . ibuprofen (ADVIL,MOTRIN) 800 MG tablet Take 1 tablet (800 mg total) by mouth 3 (three) times daily. 21 tablet 0  . prochlorperazine (COMPAZINE) 5 MG tablet Take 1 tablet (5 mg total) by mouth every 6 (six) hours as needed for nausea or vomiting. 30 tablet 0   No current facility-administered medications for this visit.    PHYSICAL EXAMINATION: ECOG PERFORMANCE STATUS: 1 - Symptomatic but completely ambulatory  Filed Vitals:   08/10/15 0841  BP: 119/60  Pulse: 67  Temp: 97.8 F (36.6 C)  Resp: 19   Filed Weights   08/10/15 0841  Weight: 245 lb 14.4 oz (111.54 kg)    GENERAL:alert, no distress and comfortable SKIN: skin color, texture, turgor are normal, no rashes or significant lesions EYES: normal, Conjunctiva are pink and  non-injected, sclera clear OROPHARYNX:no exudate, no erythema and lips, buccal mucosa, and tongue normal  NECK: supple, thyroid normal size, non-tender, without nodularity LYMPH:  no palpable lymphadenopathy in the cervical, axillary or inguinal LUNGS: clear to auscultation and percussion with normal breathing effort HEART: regular rate & rhythm and no murmurs and no lower extremity edema ABDOMEN:abdomen soft, non-tender and normal bowel sounds MUSCULOSKELETAL:no cyanosis of digits and no clubbing  NEURO: alert & oriented x 3 with fluent speech, no focal motor/sensory deficits EXTREMITIES: No lower extremity edema BREAST: Slight fullness in the left axilla, tender to palpation. (exam performed in the presence of a chaperone)  LABORATORY DATA:  I have reviewed the data as listed   Chemistry      Component Value Date/Time   NA 141 02/02/2015 1508   NA 139 12/09/2014 1422   K 3.7 02/02/2015 1508   K 4.4 12/09/2014 1422   CL 105 12/09/2014 1422   CO2 28 02/02/2015 1508   CO2 28 12/09/2014 1422   BUN 8.6 02/02/2015 1508   BUN 8 12/09/2014 1422   CREATININE 1.0 02/02/2015 1508   CREATININE 0.91 12/09/2014 1422      Component Value Date/Time   CALCIUM 9.3 02/02/2015 1508   CALCIUM 9.3 12/09/2014 1422   ALKPHOS 102 02/02/2015 1508   ALKPHOS 86 12/09/2014 1422  AST 21 02/02/2015 1508   AST 22 12/09/2014 1422   ALT 19 02/02/2015 1508   ALT 18 12/09/2014 1422   BILITOT 0.28 02/02/2015 1508   BILITOT 0.4 12/09/2014 1422       Lab Results  Component Value Date   WBC 6.3 02/02/2015   HGB 12.5 02/02/2015   HCT 38.5 02/02/2015   MCV 82.4 02/02/2015   PLT 298 02/02/2015   NEUTROABS 4.5 02/02/2015   ASSESSMENT & PLAN:  Breast cancer of upper-outer quadrant of left female breast ([place]) Left breast lumpectomy: Invasive ductal carcinoma with DCIS, no LVI, 2 sentinel nodes negative, 1.3 cm, grade 1, 100%, PR 96 wasn't, HER-2 negative Ki-67 17% T1 cN0 M0 stage IA Oncotype DX  recurrence score is 7, 6% ROR status post radiation completed May 2016, patient had hysterectomy with bilateral salpingo-oophorectomy 2011) Anastrozole 1 mg daily started June 2016 stopped July 2016 due to severe headaches  Current treatment: Exemestane 25 mg once daily started 02/02/2015 Exemestane toxicities:  1. Fatigue She does not have any significant hot flashes or myalgias. Oophorectomy was over 6 years ago. Recommendation: Join the [employer] live strong program as well as [facility] program.  Breast Lump: Left axillary swelling discomfort pain and burning sensation in the left breast. I would like up in her left breast mammogram and left axillary ultrasound for further evaluation. This could be either lymphedema are musculoskeletal pain. Of course we need to rule out malignancy as well. Return to clinic based upon the findings from the mammogram and ultrasound.  Severe anxiety: On Xanax  Return to clinic as scheduled in July for follow-up. If the ultrasound or mammogram shows abnormality that needs to be discussed, we will see her immediately.   No orders of the defined types were placed in this encounter.   The patient has a good understanding of the overall plan. she agrees with it. she will call with any problems that may develop before the next visit here.   Rulon Eisenmenger, MD 08/10/2015

## 2015-08-10 NOTE — Progress Notes (Signed)
Unable to get in to exam room prior to MD.  No assessment performed.  

## 2015-08-12 ENCOUNTER — Telehealth: Payer: Self-pay | Admitting: Hematology and Oncology

## 2015-08-12 NOTE — Telephone Encounter (Signed)
Patient already on schedule for mammo/us 2/20. No other orders per 2/14 pof

## 2015-08-16 ENCOUNTER — Ambulatory Visit
Admission: RE | Admit: 2015-08-16 | Discharge: 2015-08-16 | Disposition: A | Discharge status: Home / Self Care | Payer: PRIVATE HEALTH INSURANCE | Source: Ambulatory Visit | Attending: Hematology and Oncology | Admitting: Hematology and Oncology

## 2015-08-16 ENCOUNTER — Other Ambulatory Visit: Payer: Self-pay | Admitting: Hematology and Oncology

## 2015-08-16 DIAGNOSIS — C50412 Malignant neoplasm of upper-outer quadrant of left female breast: Secondary | ICD-10-CM

## 2015-09-06 ENCOUNTER — Other Ambulatory Visit: Payer: Self-pay | Admitting: Hematology and Oncology

## 2015-09-06 DIAGNOSIS — M7989 Other specified soft tissue disorders: Secondary | ICD-10-CM

## 2015-09-18 ENCOUNTER — Emergency Department (HOSPITAL_COMMUNITY)
Admission: EM | Admit: 2015-09-18 | Discharge: 2015-09-18 | Disposition: A | Discharge status: Home / Self Care | Payer: PRIVATE HEALTH INSURANCE | Attending: Emergency Medicine | Admitting: Emergency Medicine

## 2015-09-18 ENCOUNTER — Encounter (HOSPITAL_COMMUNITY): Payer: Self-pay

## 2015-09-18 DIAGNOSIS — R1110 Vomiting, unspecified: Secondary | ICD-10-CM | POA: Diagnosis not present

## 2015-09-18 DIAGNOSIS — R69 Illness, unspecified: Secondary | ICD-10-CM

## 2015-09-18 DIAGNOSIS — J111 Influenza due to unidentified influenza virus with other respiratory manifestations: Secondary | ICD-10-CM | POA: Diagnosis not present

## 2015-09-18 DIAGNOSIS — E669 Obesity, unspecified: Secondary | ICD-10-CM | POA: Insufficient documentation

## 2015-09-18 DIAGNOSIS — Z79899 Other long term (current) drug therapy: Secondary | ICD-10-CM | POA: Diagnosis not present

## 2015-09-18 DIAGNOSIS — R1084 Generalized abdominal pain: Secondary | ICD-10-CM | POA: Diagnosis not present

## 2015-09-18 DIAGNOSIS — R011 Cardiac murmur, unspecified: Secondary | ICD-10-CM | POA: Diagnosis not present

## 2015-09-18 DIAGNOSIS — Z853 Personal history of malignant neoplasm of breast: Secondary | ICD-10-CM | POA: Diagnosis not present

## 2015-09-18 DIAGNOSIS — Z8659 Personal history of other mental and behavioral disorders: Secondary | ICD-10-CM | POA: Insufficient documentation

## 2015-09-18 DIAGNOSIS — R509 Fever, unspecified: Secondary | ICD-10-CM | POA: Diagnosis present

## 2015-09-18 DIAGNOSIS — J45909 Unspecified asthma, uncomplicated: Secondary | ICD-10-CM | POA: Diagnosis not present

## 2015-09-18 LAB — URINALYSIS, ROUTINE W REFLEX MICROSCOPIC
Bilirubin Urine: NEGATIVE
GLUCOSE, UA: NEGATIVE mg/dL
Hgb urine dipstick: NEGATIVE
Ketones, ur: NEGATIVE mg/dL
LEUKOCYTES UA: NEGATIVE
Nitrite: NEGATIVE
PH: 8 (ref 5.0–8.0)
PROTEIN: NEGATIVE mg/dL
Specific Gravity, Urine: 1.017 (ref 1.005–1.030)

## 2015-09-18 MED ORDER — ONDANSETRON 4 MG PO TBDP
8.0000 mg | ORAL_TABLET | Freq: Once | ORAL | Status: AC
  Administered 2015-09-18: 8 mg via ORAL
  Filled 2015-09-18: qty 2

## 2015-09-18 MED ORDER — IBUPROFEN 400 MG PO TABS
600.0000 mg | ORAL_TABLET | Freq: Once | ORAL | Status: AC
  Administered 2015-09-18: 600 mg via ORAL
  Filled 2015-09-18: qty 1

## 2015-09-18 MED ORDER — BENZONATATE 100 MG PO CAPS: 100.0000 mg | ORAL_CAPSULE | Freq: Three times a day (TID) | ORAL | Status: DC

## 2015-09-18 NOTE — ED Notes (Signed)
Pt reports fever, aches, sore throat, vomiting starting four days ago. She felt better yesterday but woke up today with worsened symptoms. Pt took tylenol at 8am this morning.

## 2015-09-18 NOTE — Discharge Instructions (Signed)
There does not appear to be an emergent cause. Symptoms at this time. Your symptoms are likely due to a virus and should resolve on their own. It is important to stay well hydrated and drink plenty of water or Gatorade as we discussed. He may also try Alka-Seltzer plus during the day for your symptoms, NyQuil at night. Use your cough medication as prescribed. Follow-up with your doctor next week. Return to ED for new or worsening symptoms as we discussed

## 2015-09-18 NOTE — ED Provider Notes (Signed)
CSN: [id number]     Arrival date & time 09/18/15  1234 History   First MD Initiated Contact with Patient 09/18/15 1318     Chief Complaint  Patient presents with  . Fever  . URI  . Emesis     (Consider location/radiation/quality/duration/timing/severity/associated sxs/prior Treatment) HPI [name] is a 40 y.o. female history of breast cancer status post lumpectomy January 2016, status post hysterectomy 2011, who comes in for evaluation of fever, chills, dry cough, sore throat, vomiting and body aches over the past 4 days. She reports initially feeling better yesterday with Tylenol and NyQuil, but symptoms returned today. Denies urinary symptoms, back pain, overt abdominal pain, diarrhea or constipation, bloody emesis. Did not receive flu shot this year.No other modifying factors.  Past Medical History  Diagnosis Date  . History of seizures     during pregnancies; last seizure 3 yrs. ago  . Stress headaches   . Asthma     no current med.  . Breast cancer of upper-outer quadrant of left female breast ([place]) 07/16/2014  . Heart murmur     as a child  . Seizures ([place])     no medications   Past Surgical History  Procedure Laterality Date  . Cesarean section  [id number]  . Tympanostomy tube placement    . Total abdominal hysterectomy w/ bilateral salpingoophorectomy  12/20/2009  . Hysteroscopy w/ endometrial ablation  11/11/2007  . Lysis of adhesion  12/20/2009  . Cysto  12/20/2009  . Cesarean section with bilateral tubal ligation  03/03/2003  . Abdominal hysterectomy    . Breast surgery      lumpectomy and lymph nodes   Family History  Problem Relation Age of Onset  . Hypertension Mother   . Breast cancer Other     MGM's sister  . Breast cancer Maternal Grandmother     dx in her 74s   Social History  Substance Use Topics  . Smoking status: Never Smoker   . Smokeless tobacco: Never Used  . Alcohol Use: No   OB History    No data available     Review of Systems A  10 point review of systems was completed and was negative except for pertinent positives and negatives as mentioned in the history of present illness     Allergies  Review of patient's allergies indicates no known allergies.  Home Medications   Prior to Admission medications   Medication Sig Start Date End Date Taking? Authorizing Provider  ALPRAZolam (XANAX) 0.25 MG tablet Take 1 tablet (0.25 mg total) by mouth at bedtime as needed for anxiety. 07/05/15   Nicholas Lose, MD  benzonatate (TESSALON) 100 MG capsule Take 1 capsule (100 mg total) by mouth every 8 (eight) hours. 09/18/15   Comer Locket, PA-C  chlorpheniramine-HYDROcodone (TUSSIONEX PENNKINETIC ER) 10-8 MG/5ML SUER Take 5 mLs by mouth at bedtime as needed for cough. 07/25/15   Shawn C Joy, PA-C  exemestane (AROMASIN) 25 MG tablet Take 1 tablet (25 mg total) by mouth daily after breakfast. 07/05/15   Nicholas Lose, MD  ibuprofen (ADVIL,MOTRIN) 800 MG tablet Take 1 tablet (800 mg total) by mouth 3 (three) times daily. 07/25/15   Shawn C Joy, PA-C  prochlorperazine (COMPAZINE) 5 MG tablet Take 1 tablet (5 mg total) by mouth every 6 (six) hours as needed for nausea or vomiting. 12/02/14   Chauncey Cruel, MD   BP 112/64 mmHg  Pulse 86  Temp(Src) 99.1 F (37.3 C) (Oral)  Resp 16  Ht 5\' 2"  (1.575 m)  SpO2 98% Physical Exam  Constitutional: She is oriented to person, place, and time. She appears well-developed and well-nourished.  Obese Caucasian female  HENT:  Head: Normocephalic and atraumatic.  Mouth/Throat: Oropharynx is clear and moist. No oropharyngeal exudate.  Eyes: Conjunctivae are normal. Pupils are equal, round, and reactive to light. Right eye exhibits no discharge. Left eye exhibits no discharge. No scleral icterus.  Neck: Normal range of motion. Neck supple.  No meningismus or nuchal rigidity  Cardiovascular: Normal rate, regular rhythm and normal heart sounds.   Pulmonary/Chest: Effort normal and breath sounds normal.  No respiratory distress. She has no wheezes. She has no rales.  Abdominal: Soft.  Mildly diffuse abdominal discomfort with deep palpation. No focal abdominal tenderness. No rebound or guarding, no other abnormalities.  Musculoskeletal: Normal range of motion. She exhibits no edema or tenderness.  Neurological: She is alert and oriented to person, place, and time. No cranial nerve deficit. Coordination normal.  Cranial Nerves II-XII grossly intact  Skin: Skin is warm and dry. No rash noted.  Psychiatric: She has a normal mood and affect.  Nursing note and vitals reviewed.   ED Course  Procedures (including critical care time) Labs Review Labs Reviewed  URINALYSIS, ROUTINE W REFLEX MICROSCOPIC (NOT AT [facility])    Imaging Review No results found. I have personally reviewed and evaluated these images and lab results as part of my medical decision-making.   EKG Interpretation None     Meds given in ED:  Medications  ibuprofen (ADVIL,MOTRIN) tablet 600 mg (600 mg Oral Given 09/18/15 1344)  ondansetron (ZOFRAN-ODT) disintegrating tablet 8 mg (8 mg Oral Given 09/18/15 1345)    Discharge Medication List as of 09/18/2015  3:40 PM      Filed Vitals:   09/18/15 1332 09/18/15 1446  BP: 120/48 112/64  Pulse: 96 86  Temp: 101.7 F (38.7 C) 99.1 F (37.3 C)  TempSrc: Oral Oral  Resp: 20 16  Height: 5\' 2"  (1.575 m)   SpO2: 98% 98%    MDM  Patient with symptoms consistent with Influenza-like illness.  Vitals are stable, low-grade fever.  No signs of dehydration, tolerating PO's.  Lungs are clear. Due to patient's presentation and physical exam a chest x-ray was not ordered bc likely diagnosis of flu. She did have some mild, diffuse abdominal discomfort, ordered urinalysis. No peritoneal signs or evidence of surgical abdomen. Urinalysis unremarkable and without evidence of infection. Discussed the cost versus benefit of Tamiflu treatment with the patient.  The patient understands that  symptoms are greater than the recommended 24-48 hour window of treatment.  Patient will be discharged with instructions to orally hydrate, rest, and use over-the-counter medications such as anti-inflammatories ibuprofen and Aleve for muscle aches and Tylenol for fever.    Final diagnoses:  Influenza-like illness        Comer Locket, PA-C 09/18/15 [place], MD 09/18/15 [phone]

## 2015-09-27 ENCOUNTER — Other Ambulatory Visit: Payer: PRIVATE HEALTH INSURANCE

## 2015-10-11 ENCOUNTER — Other Ambulatory Visit: Payer: PRIVATE HEALTH INSURANCE

## 2015-10-19 ENCOUNTER — Ambulatory Visit
Admission: RE | Admit: 2015-10-19 | Discharge: 2015-10-19 | Disposition: A | Discharge status: Home / Self Care | Payer: PRIVATE HEALTH INSURANCE | Source: Ambulatory Visit | Attending: Hematology and Oncology | Admitting: Hematology and Oncology

## 2015-10-19 DIAGNOSIS — M7989 Other specified soft tissue disorders: Secondary | ICD-10-CM

## 2015-12-12 ENCOUNTER — Encounter (HOSPITAL_COMMUNITY): Payer: Self-pay

## 2015-12-12 ENCOUNTER — Emergency Department (HOSPITAL_COMMUNITY)
Admission: EM | Admit: 2015-12-12 | Discharge: 2015-12-12 | Disposition: A | Discharge status: Home / Self Care | Payer: PRIVATE HEALTH INSURANCE | Attending: Emergency Medicine | Admitting: Emergency Medicine

## 2015-12-12 DIAGNOSIS — Z791 Long term (current) use of non-steroidal anti-inflammatories (NSAID): Secondary | ICD-10-CM | POA: Insufficient documentation

## 2015-12-12 DIAGNOSIS — Y929 Unspecified place or not applicable: Secondary | ICD-10-CM | POA: Diagnosis not present

## 2015-12-12 DIAGNOSIS — S60562A Insect bite (nonvenomous) of left hand, initial encounter: Secondary | ICD-10-CM | POA: Insufficient documentation

## 2015-12-12 DIAGNOSIS — Y939 Activity, unspecified: Secondary | ICD-10-CM | POA: Diagnosis not present

## 2015-12-12 DIAGNOSIS — Z79899 Other long term (current) drug therapy: Secondary | ICD-10-CM | POA: Diagnosis not present

## 2015-12-12 DIAGNOSIS — T63461A Toxic effect of venom of wasps, accidental (unintentional), initial encounter: Secondary | ICD-10-CM

## 2015-12-12 DIAGNOSIS — J45909 Unspecified asthma, uncomplicated: Secondary | ICD-10-CM | POA: Diagnosis not present

## 2015-12-12 DIAGNOSIS — S60561A Insect bite (nonvenomous) of right hand, initial encounter: Secondary | ICD-10-CM | POA: Insufficient documentation

## 2015-12-12 DIAGNOSIS — Y999 Unspecified external cause status: Secondary | ICD-10-CM | POA: Insufficient documentation

## 2015-12-12 DIAGNOSIS — W57XXXA Bitten or stung by nonvenomous insect and other nonvenomous arthropods, initial encounter: Secondary | ICD-10-CM | POA: Insufficient documentation

## 2015-12-12 MED ORDER — DIPHENHYDRAMINE HCL 25 MG PO CAPS
25.0000 mg | ORAL_CAPSULE | Freq: Once | ORAL | Status: AC
  Administered 2015-12-12: 25 mg via ORAL

## 2015-12-12 MED ORDER — HYDROCODONE-ACETAMINOPHEN 5-325 MG PO TABS
1.0000 | ORAL_TABLET | Freq: Once | ORAL | Status: AC
  Administered 2015-12-12: 1 via ORAL

## 2015-12-12 MED ORDER — DIPHENHYDRAMINE HCL 25 MG PO CAPS
ORAL_CAPSULE | ORAL | Status: AC
  Filled 2015-12-12: qty 1

## 2015-12-12 MED ORDER — HYDROCODONE-ACETAMINOPHEN 5-325 MG PO TABS
ORAL_TABLET | ORAL | Status: AC
  Filled 2015-12-12: qty 1

## 2015-12-12 NOTE — ED Notes (Signed)
See providers assessment.  

## 2015-12-12 NOTE — ED Provider Notes (Signed)
CSN: [id number]     Arrival date & time 12/12/15  2235 History  By signing my name below, I, Soijett Blue, attest that this documentation has been prepared under the direction and in the presence of Montine Circle, PA-C Electronically Signed: [place], ED Scribe. 12/12/2015. 10:52 PM.   Chief Complaint  Patient presents with  . Insect Bite      The history is provided by the patient. No language interpreter was used.     [name] is a 40 y.o. female with a PMHx of breast CA, who presents to the Emergency Department complaining of an insect bite onset 20 minutes PTA. Pt reports that she was stung by 8 times by yellow jackets to her bilateral hands. Denies being stung anywhere else. Pt is having associated symptoms of redness at the site. Pt reports that she has not tried any medications for the relief of her symptoms. Pt denies SOB, trouble swallowing, and any other symptoms.  Past Medical History  Diagnosis Date  . History of seizures     during pregnancies; last seizure 3 yrs. ago  . Stress headaches   . Asthma     no current med.  . Breast cancer of upper-outer quadrant of left female breast ([place]) 07/16/2014  . Heart murmur     as a child  . Seizures ([place])     no medications   Past Surgical History  Procedure Laterality Date  . Cesarean section  [id number]  . Tympanostomy tube placement    . Total abdominal hysterectomy w/ bilateral salpingoophorectomy  12/20/2009  . Hysteroscopy w/ endometrial ablation  11/11/2007  . Lysis of adhesion  12/20/2009  . Cysto  12/20/2009  . Cesarean section with bilateral tubal ligation  03/03/2003  . Abdominal hysterectomy    . Breast surgery      lumpectomy and lymph nodes   Family History  Problem Relation Age of Onset  . Hypertension Mother   . Breast cancer Other     MGM's sister  . Breast cancer Maternal Grandmother     dx in her 24s   Social History  Substance Use Topics  . Smoking status: Never Smoker   . Smokeless  tobacco: Never Used  . Alcohol Use: Yes     Comment: occ   OB History    No data available     Review of Systems  Constitutional: Negative for fever and chills.  HENT: Negative for trouble swallowing.   Respiratory: Negative for shortness of breath.   Skin: Positive for color change (redness to the site of insect bite).  All other systems reviewed and are negative.     Allergies  Review of patient's allergies indicates no known allergies.  Home Medications   Prior to Admission medications   Medication Sig Start Date End Date Taking? Authorizing Provider  ALPRAZolam (XANAX) 0.25 MG tablet Take 1 tablet (0.25 mg total) by mouth at bedtime as needed for anxiety. 07/05/15   Nicholas Lose, MD  benzonatate (TESSALON) 100 MG capsule Take 1 capsule (100 mg total) by mouth every 8 (eight) hours. 09/18/15   Comer Locket, PA-C  chlorpheniramine-HYDROcodone (TUSSIONEX PENNKINETIC ER) 10-8 MG/5ML SUER Take 5 mLs by mouth at bedtime as needed for cough. 07/25/15   Shawn C Joy, PA-C  exemestane (AROMASIN) 25 MG tablet Take 1 tablet (25 mg total) by mouth daily after breakfast. 07/05/15   Nicholas Lose, MD  ibuprofen (ADVIL,MOTRIN) 800 MG tablet Take 1 tablet (800 mg total) by mouth  3 (three) times daily. 07/25/15   Shawn C Joy, PA-C  prochlorperazine (COMPAZINE) 5 MG tablet Take 1 tablet (5 mg total) by mouth every 6 (six) hours as needed for nausea or vomiting. 12/02/14   Chauncey Cruel, MD   BP 121/70 mmHg  Pulse 62  Temp(Src) 97.6 F (36.4 C) (Oral)  Resp 18  SpO2 97% Physical Exam  Constitutional: She is oriented to person, place, and time. She appears well-developed and well-nourished. No distress.  HENT:  Head: Normocephalic and atraumatic.  Oropharynx is clear Airway is patent No stridor No swelling  Eyes: EOM are normal.  Neck: Neck supple.  Cardiovascular: Normal rate, regular rhythm and normal heart sounds.  Exam reveals no gallop and no friction rub.   No murmur  heard. Pulmonary/Chest: Effort normal and breath sounds normal. No respiratory distress. She has no wheezes. She has no rales.  CTAB  Abdominal: She exhibits no distension.  Musculoskeletal: Normal range of motion.  Neurological: She is alert and oriented to person, place, and time.  Skin: Skin is warm and dry.  Local inflammatory reactions around sting sites on fingers and extremities  Psychiatric: She has a normal mood and affect. Her behavior is normal.  Nursing note and vitals reviewed.   ED Course  Procedures (including critical care time) DIAGNOSTIC STUDIES: Oxygen Saturation is 97% on RA, nl by my interpretation.    COORDINATION OF CARE: 10:52 PM Discussed treatment plan with pt at bedside which includes benadryl and norco and pt agreed to plan.     MDM   Final diagnoses:  Yellow jacket sting, accidental or unintentional, initial encounter    Patient with yellow jacket stings on right hand/fingers, left leg, and left arm.  No evidence of anaphylaxis.  Symptomatic care.  I personally performed the services described in this documentation, which was scribed in my presence. The recorded information has been reviewed and is accurate.       Montine Circle, PA-C 12/12/15 2317  Forde Dandy, MD 12/13/15 0040

## 2015-12-12 NOTE — Discharge Instructions (Signed)
Bee, Wasp, or [employer], wasps, and hornets are part of a family of insects that can sting people. These stings can cause pain and inflammation, but they are usually not serious. However, some people may have an allergic reaction to a sting. This can cause the symptoms to be more severe.  SYMPTOMS  Common symptoms of this condition include:   A red lump in the skin that sometimes has a tiny hole in the center. In some cases, a stinger may be in the center of the wound.  Pain and itching at the sting site.  Redness and swelling around the sting site. If you have an allergic reaction (localized allergic reaction), the swelling and redness may spread out from the sting site. In some cases, this reaction can continue to develop over the next 12-36 hours. In rare cases, a person may have a severe allergic reaction (anaphylactic reaction) to a sting. Symptoms of an anaphylactic reaction may include:   Wheezing or difficulty breathing.  Raised, itchy, red patches on the skin.  Nausea or vomiting.  Abdominal cramping.  Diarrhea.  Chest pain.  Fainting.  Redness of the face (flushing). DIAGNOSIS  This condition is usually diagnosed based on symptoms, medical history, and a physical exam. TREATMENT  Most stings can be treated with:   Icing to reduce swelling.  Medicines (antihistamines) to treat itching or an allergic reaction.  Medicines to help reduce pain. These may be medicines that you take by mouth, or medicated creams or lotions that you apply to your skin. If you were stung by a bee, the stinger and a small sac of poison may be in the wound. This may be removed by brushing across it with a flat card, such as a credit card. Another method is to pinch the area and pull it out. These methods can help reduce the severity of the body's reaction to the sting.  HOME CARE INSTRUCTIONS   Wash the sting site daily with soap and water as told by your health care provider.  Apply  or take over-the-counter and prescription medicines only as told by your health care provider.  If directed, apply ice to the sting area.  Put ice in a plastic bag.  Place a towel between your skin and the bag.  Leave the ice on for 20 minutes, 2-3 times per day.  Do not scratch the sting area.  To lessen pain, try using a paste that is made of water and baking soda. Rub the paste on the sting area and leave it on for 5 minutes.  If you had a severe allergic reaction to a sting, you may need:  To wear a medical bracelet or necklace that lists the allergy.  To learn when and how to use an anaphylaxis kit or epinephrine injection. Your family members may also need to learn this.  To carry an anaphylaxis kit with you at all times. SEEK MEDICAL CARE IF:   Your symptoms do not get better in 2-3 days.  You have redness, swelling, or pain that spreads beyond the area of the sting.  You have a fever. SEEK IMMEDIATE MEDICAL CARE IF:  You have symptoms of a severe allergic reaction. These include:   Wheezing or difficulty breathing.  Chest pain.  Light-headedness or fainting.  Itchy, raised, red patches on the skin.  Nausea or vomiting.  Abdominal cramping.  Diarrhea.   This information is not intended to replace advice given to you by your health care provider.  Make sure you discuss any questions you have with your health care provider. °  °Document Released: 06/12/2005 Document Revised: 03/03/2015 Document Reviewed: 10/28/2014 °Elsevier Interactive Patient Education ©2016 Elsevier Inc. ° °

## 2015-12-12 NOTE — ED Notes (Signed)
Onset 20 min PTA stung by 8 yellow jackets, right hand, right middle finger, left lower leg, left arm.  No lip, tongue, throat swelling.  No benadryl.

## 2015-12-21 ENCOUNTER — Telehealth: Payer: Self-pay | Admitting: *Deleted

## 2015-12-21 NOTE — Telephone Encounter (Signed)
Pt called stating that she is having some pain and request to be seen sooner.  Confirmed her to see Dr. Lindi Adie on 12/24/15.

## 2015-12-24 ENCOUNTER — Ambulatory Visit: Payer: PRIVATE HEALTH INSURANCE | Admitting: Hematology and Oncology

## 2015-12-24 ENCOUNTER — Telehealth: Payer: Self-pay | Admitting: *Deleted

## 2015-12-24 NOTE — Telephone Encounter (Signed)
Pt called stating she cannot make her appt today.  Rescheduled & confirmed 12/29/15 appt w/ pt.

## 2015-12-24 NOTE — Assessment & Plan Note (Signed)
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 Fatigue She does not have any significant hot flashes or myalgias. Oophorectomy was over 6 years ago. Recommendation: Join the [employer] live strong program as well as [facility] program.  Breast Lump: Left axillary swelling discomfort pain and burning sensation in the left breast. Left breast mammogram and ultrasound 10/19/2015, no evidence of malignancy stable postoperative changes with benign fat necrosis within the lower axilla has resolved indicating that its benign  Severe anxiety: On Xanax  Return to clinic 6 months for follow-up

## 2015-12-27 ENCOUNTER — Other Ambulatory Visit: Payer: Self-pay | Admitting: Hematology and Oncology

## 2015-12-28 NOTE — Assessment & Plan Note (Signed)
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 Fatigue She does not have any significant hot flashes or myalgias. Oophorectomy was over 6 years ago. Recommendation: Join the [employer] live strong program as well as [facility] program.  Breast Lump: Left axillary swelling discomfort pain and burning sensation in the left breast. Mammogram and U/S: Post surg changes, fat necrosis Severe anxiety: On Xanax  Return to clinic in 6 months

## 2015-12-29 ENCOUNTER — Encounter: Payer: Self-pay | Admitting: Hematology and Oncology

## 2015-12-29 ENCOUNTER — Telehealth: Payer: Self-pay | Admitting: Hematology and Oncology

## 2015-12-29 ENCOUNTER — Ambulatory Visit (HOSPITAL_BASED_OUTPATIENT_CLINIC_OR_DEPARTMENT_OTHER): Payer: PRIVATE HEALTH INSURANCE | Admitting: Hematology and Oncology

## 2015-12-29 VITALS — BP 119/68 | HR 72 | Temp 98.3°F | Resp 18 | Ht 62.0 in | Wt 249.3 lb

## 2015-12-29 DIAGNOSIS — C50412 Malignant neoplasm of upper-outer quadrant of left female breast: Secondary | ICD-10-CM

## 2015-12-29 DIAGNOSIS — R51 Headache: Secondary | ICD-10-CM

## 2015-12-29 DIAGNOSIS — F411 Generalized anxiety disorder: Secondary | ICD-10-CM | POA: Diagnosis not present

## 2015-12-29 NOTE — Progress Notes (Signed)
Patient Care Team: No Pcp Per Patient as PCP - General (General Practice) Alphonsa Overall, MD as Consulting Physician (General Surgery) Nicholas Lose, MD as Consulting Physician (Hematology and Oncology) Thea Silversmith, MD as Consulting Physician (Radiation Oncology) Rockwell Germany, RN as Registered Nurse Mauro Kaufmann, RN as Registered Nurse Holley Bouche, NP as Nurse Practitioner (Nurse Practitioner)  DIAGNOSIS: Breast cancer of upper-outer quadrant of left female breast [facility])   Staging form: Breast, AJCC 7th Edition     Clinical stage from 07/22/2014: Stage IA (T1c, N0, M0) - Unsigned     Pathologic: Stage IA (T1c, N0, cM0) - Unsigned   SUMMARY OF ONCOLOGIC HISTORY:   Breast cancer of upper-outer quadrant of left female breast ([place])   07/14/2014 Mammogram Left breast UOQ 2:00 position: 1.4 x 1.1 x 1.2 cm mass with indistinct, spiculated margins. Right breast negative.   07/14/2014 Breast US 1.5 cm hypoechoic, irregular mass with spiculated margins UOQ left breast at 2:00, 15 cm from nipple. Intramammary LN 1.4 cm superior to the mass (thin cortex) with another IM LN 3 cm inferior to the index mass with mild focal cortical thickening    07/14/2014 Initial Biopsy Left breast needle core biopsy (UOQ, 2:00 position): IDC with DCIS; grade 2, ER 100%, PR 96%, HER-2 negative ratio 1.17, Ki-67 17%;  Intramammary LN needle core biopsy (outer left breast, posterior 3:00 position): negative for malignancy.   07/24/2014 Breast MRI Left breast: 1.4 x 1.4 x 1.3 cm enhancing mass with irregular borders at 3 o'clock position. Right breast with no abnormal enhancement. No abnormal appearing LNs.    07/29/2014 Procedure Genetic counseling/testing: Breast/Ovarian cancer panel ([facility]) shows no clinically significant variants in ATM, BARD1, BRCA1, BRCA2, BRIP1, CDH1, CHEK2, EPCAM, FANCC, MLH1, MSH2, MSH6, NBN, PALB2, PMS2, PTEN, RAD51C, RAD51D, STK11, TP53, and XRCC2.     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 Negative margins. ER+ (100%), PR+ (96%), HER-2 repeated and negative by FISH.  Ki-67 17%.   08/06/2014 Clinical Stage Stage IA: T1c, N0 M0   08/06/2014 Oncotype testing Recurrence score: 7 (6% ROR).  No chemo ([facility]).    09/16/2014 - 11/09/2014 Radiation Therapy Adjuvant RT completed Pablo Ledger). Left breast/ 50.4 Gy at 1.8 Gy per fraction x 28 fractions.  Left breast boost/ 10 Gy at 2 Gy per fraction x 5 fractions   11/25/2014 -  Anti-estrogen oral therapy Anastrozole 1 mg daily stopped due to headaches switched to exemestane 02/02/2015. Planned duration of treatment 5 years.    CHIEF COMPLIANT: Follow-up on exemestane  INTERVAL HISTORY: [name] is a 40 year old with above-mentioned history of left breast cancer currently on adjuvant antiestrogen therapy. Initially she was on anastrozole which caused a lot of headaches and was switched to exemestane. She appears with tolerating exemestane well. Denies any significant hot flashes or myalgias. Patient has profound anxiety and she is planning to meet with a primary care physician to talk about antianxiety treatments.  REVIEW OF SYSTEMS:   Constitutional: Denies fevers, chills or abnormal weight loss Eyes: Denies blurriness of vision Ears, nose, mouth, throat, and face: Denies mucositis or sore throat Respiratory: Denies cough, dyspnea or wheezes Cardiovascular: Denies palpitation, chest discomfort Gastrointestinal:  Denies nausea, heartburn or change in bowel habits Skin: Denies abnormal skin rashes Lymphatics: Denies new lymphadenopathy or easy bruising Neurological:Denies numbness, tingling or new weaknesses Behavioral/Psych: Mood is stable, no new changes  Extremities:  No lower extremity edema Breast:  Complaining of pain in the breast All other systems were reviewed with the patient and are negative.  I  have reviewed the past medical history, past surgical history, social history and family history with the patient and they are unchanged from previous note.  ALLERGIES:  has No Known Allergies.  MEDICATIONS:  Current Outpatient Prescriptions  Medication Sig Dispense Refill  . exemestane (AROMASIN) 25 MG tablet Take 1 tablet (25 mg total) by mouth daily after breakfast. 90 tablet 3   No current facility-administered medications for this visit.    PHYSICAL EXAMINATION: ECOG PERFORMANCE STATUS: 1 - Symptomatic but completely ambulatory  Filed Vitals:   12/29/15 1017  BP: 119/68  Pulse: 72  Temp: 98.3 F (36.8 C)  Resp: 18   Filed Weights   12/29/15 1017  Weight: 249 lb 4.8 oz (113.082 kg)    GENERAL:alert, no distress and comfortable SKIN: skin color, texture, turgor are normal, no rashes or significant lesions EYES: normal, Conjunctiva are pink and non-injected, sclera clear OROPHARYNX:no exudate, no erythema and lips, buccal mucosa, and tongue normal  NECK: supple, thyroid normal size, non-tender, without nodularity LYMPH:  no palpable lymphadenopathy in the cervical, axillary or inguinal LUNGS: clear to auscultation and percussion with normal breathing effort HEART: regular rate & rhythm and no murmurs and no lower extremity edema ABDOMEN:abdomen soft, non-tender and normal bowel sounds MUSCULOSKELETAL:no cyanosis of digits and no clubbing  NEURO: alert & oriented x 3 with fluent speech, no focal motor/sensory deficits EXTREMITIES: No lower extremity edema  LABORATORY DATA:  I have reviewed the data as listed   Chemistry      Component Value Date/Time   NA 141 02/02/2015 1508   NA 139 12/09/2014 1422   K 3.7 02/02/2015 1508   K 4.4 12/09/2014 1422   CL 105 12/09/2014 1422   CO2 28 02/02/2015 1508   CO2 28 12/09/2014 1422   BUN 8.6 02/02/2015 1508   BUN 8 12/09/2014 1422   CREATININE 1.0 02/02/2015 1508   CREATININE 0.91 12/09/2014 1422      Component Value  Date/Time   CALCIUM 9.3 02/02/2015 1508   CALCIUM 9.3 12/09/2014 1422   ALKPHOS 102 02/02/2015 1508   ALKPHOS 86 12/09/2014 1422   AST 21 02/02/2015 1508   AST 22 12/09/2014 1422   ALT 19 02/02/2015 1508   ALT 18 12/09/2014 1422   BILITOT 0.28 02/02/2015 1508   BILITOT 0.4 12/09/2014 1422       Lab Results  Component Value Date   WBC 6.3 02/02/2015   HGB 12.5 02/02/2015   HCT 38.5 02/02/2015   MCV 82.4 02/02/2015   PLT 298 02/02/2015   NEUTROABS 4.5 02/02/2015     ASSESSMENT & PLAN:  Breast cancer of upper-outer quadrant of left female breast ([facility]) Left breast lumpectomy: Invasive ductal carcinoma with DCIS, 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 Fatigue She does not have any significant hot flashes or myalgias. Oophorectomy was over 6 years ago. Recommendation: Join the [employer] live strong program as well as [facility] program.  Breast Lump: Left axillary swelling discomfort pain  and burning sensation in the left breast. Mammogram and U/S: Post surg changes, fat necrosis Severe anxiety: On Xanax And patient's plan to meet with the primary care physician to take up the care of for anxiety. Weight reduction: I discussed with her extensively about intermittent fasting and encouraged her to lose weight. Severe fatigue: Patient reports to me that she has had fatigue long-term.  Return to clinic in 6 months    No orders of the defined types were placed in this encounter.   The patient has a good understanding of the overall plan. she agrees with it. she will call with any problems  that may develop before the next visit here.   Rulon Eisenmenger, MD 12/29/2015

## 2015-12-29 NOTE — Telephone Encounter (Signed)
cld & spoke to pt and gave pt cime & date of next appt 07/03/16@2 :30

## 2016-01-04 ENCOUNTER — Ambulatory Visit: Payer: PRIVATE HEALTH INSURANCE | Admitting: Hematology and Oncology

## 2016-05-17 ENCOUNTER — Telehealth: Payer: Self-pay | Admitting: *Deleted

## 2016-05-17 ENCOUNTER — Encounter: Payer: Self-pay | Admitting: Radiation Oncology

## 2016-05-17 ENCOUNTER — Other Ambulatory Visit: Payer: Self-pay

## 2016-05-17 ENCOUNTER — Encounter: Payer: Self-pay | Admitting: Hematology and Oncology

## 2016-05-17 ENCOUNTER — Ambulatory Visit
Admission: RE | Admit: 2016-05-17 | Discharge: 2016-05-17 | Disposition: A | Discharge status: Home / Self Care | Payer: PRIVATE HEALTH INSURANCE | Source: Ambulatory Visit | Attending: Radiation Oncology | Admitting: Radiation Oncology

## 2016-05-17 ENCOUNTER — Telehealth: Payer: Self-pay | Admitting: Oncology

## 2016-05-17 DIAGNOSIS — Z170 Estrogen receptor positive status [ER+]: Secondary | ICD-10-CM | POA: Diagnosis not present

## 2016-05-17 DIAGNOSIS — C50412 Malignant neoplasm of upper-outer quadrant of left female breast: Secondary | ICD-10-CM | POA: Insufficient documentation

## 2016-05-17 DIAGNOSIS — Z79899 Other long term (current) drug therapy: Secondary | ICD-10-CM | POA: Diagnosis not present

## 2016-05-17 DIAGNOSIS — Z923 Personal history of irradiation: Secondary | ICD-10-CM | POA: Insufficient documentation

## 2016-05-17 MED ORDER — EXEMESTANE 25 MG PO TABS: 25.0000 mg | ORAL_TABLET | Freq: Every day | ORAL | 3 refills | Status: DC

## 2016-05-17 NOTE — Progress Notes (Signed)
[name] is here for follow up after treatment to her left breast.  She reports having aching pain in her left underarm and scar area that started a month and a half ago.  She also said her left breast is burning and itching.  She saw her surgeon who said it may be radiation related.  She has been taking Aromasin but ran out 2 days ago.  Dr. Geralyn Flash office will be called to refill it.  She reports having fatigue.  Her last mammogram was 5 months ago.  The skin on her left breast is intact.  She does have a firm area near her left underarm.  BP 112/72 (BP Location: Right Arm, Patient Position: Sitting)   Pulse 79   Temp 98.2 F (36.8 C) (Oral)   Ht 5\' 2"  (1.575 m)   Wt 249 lb (112.9 kg)   SpO2 100%   BMI 45.54 kg/m    Wt Readings from Last 3 Encounters:  05/17/16 249 lb (112.9 kg)  12/29/15 249 lb 4.8 oz (113.1 kg)  08/10/15 245 lb 14.4 oz (111.5 kg)

## 2016-05-17 NOTE — Telephone Encounter (Signed)
Left a message for Dr. Geralyn Flash nurse to refill patient's Aromasin.

## 2016-05-17 NOTE — Telephone Encounter (Signed)
CALLED PATIENT TO INFORM OF MAMMOGRAM AND [place] FOR 05-23-16 - ARRIVAL TIME - 1:40 PM @ [facility], LVM FOR A RETURN CALL

## 2016-05-17 NOTE — Progress Notes (Signed)
Radiation Oncology         [phone] ________________________________  Name: [name] MRN: [id number]  Date: 05/17/2016  DOB: Nov 09, 1975  Follow-Up Visit Note  Outpatient  CC: No PCP Per Patient  No ref. provider found  Diagnosis: Stage IA (pT1c, pN0) grade 1 invasive ductal carcinoma with DCIS of the left breast (ER/PR+, HER2-)   ICD-9-CM ICD-10-CM   1. Malignant neoplasm of upper-outer quadrant of left female breast, unspecified estrogen receptor status ([place]) 174.4 C50.412     Chief Complaint: "I feel a new knot in my left underarm with new pain."  Previous Radiation:  09/16/2014-11/09/2014 Left breast/ 50.4 Gy at 1.8 Gy per fraction x 28 fractions.  Left breast boost/ 10 Gy at 2 Gy per fraction x 5 fractions  Narrative:  The patient returns today for routine follow-up.  She is doing well. The patient was on Anastrozole on 11/25/14, but it was stopped due to headaches. She was switched to exemestane on 02/02/15. The patient followed up with Dr. Lissa Morales on 12/29/15.  The patient had a mammogram and [place] in February 2017. She reported focal tenderness in the UOQ left breast towards the axilla at that time. Imaging showed a 8 x 5 x 5 mm echogenic mass in the site of tenderness. It was probable left breast fat necrosis. Repeat imaging in April 2017 showed that the probable benign fat necrosis has resolved.  The patient saw Dr. Lucia Gaskins on 04/06/16 who palapted no discreet masses. The patient still reports feeling a knot and burning in the left axilla.  ALLERGIES:  has No Known Allergies.  Meds: Current Outpatient Prescriptions  Medication Sig Dispense Refill  . exemestane (AROMASIN) 25 MG tablet Take 1 tablet (25 mg total) by mouth daily after breakfast. 90 tablet 3   No current facility-administered medications for this encounter.     Physical Findings: GEN The patient is in no acute distress. Patient is alert and oriented.  height is 5' 2"  (1.575 m) and weight is 249 lb  (112.9 kg). Her oral temperature is 98.2 F (36.8 C). Her blood pressure is 112/72 and her pulse is 79. Her oxygen saturation is 100%.   BREASTS She has some thickening in the left axilla without an obvious mass. This feels most consistent with scar tissue. There is no palpable lymph in the left axilla. The left breast demonstrates mild edema with darkening of the hair follicles. No palpable masses in the breasts bilaterally. No palpables masses in the right axilla. NECK No palpable cervical or supraclavicular adenopathy.  SKIM without erythema or rash.   HEENT Oral cavity is notable for a tongue piercing, but otherwise clear.  EXT No Upper extremity edema   Lab Findings: Lab Results  Component Value Date   WBC 6.3 02/02/2015   HGB 12.5 02/02/2015   HCT 38.5 02/02/2015   MCV 82.4 02/02/2015   PLT 298 02/02/2015      Radiographic Findings:  NED on April 2017 [place] / mammogram of left axilla , breast  Impression/Plan: The patient subjectively noted a" knot" in the left axilla superior where she felt changes in her left breast last April. She had negative mammography and [place] last April. While her physical exam is reassuring and her history is most consistent with scar tissue and soreness from some recent mild trauma to that region, I think it's a good idea as a precaution to order some further studies from the [facility]. Of note, the patient has run out of her  anti-estrogen pill again and reports insurance problems with refills. I will notify Dr. Lindi Adie of this. I will see her back on a PRN basis. I encouraged her to continue follow up with medical oncology as scheduled.  I asked if she would like PT referral to help with her symptoms. She said that she just wants reassurance - and if this imaging is benign, she doesn't need PT for symptom management. _____________________________________   Eppie Gibson, MD  This document serves as a record of services personally performed by Eppie Gibson, MD. It was created on her behalf by Darcus Austin, a trained medical scribe. The creation of this record is based on the scribe's personal observations and the provider's statements to them. This document has been checked and approved by the attending provider.

## 2016-05-17 NOTE — Progress Notes (Signed)
Called patient to discuss financial concerns for anti-estrogen medication. No answer and no voicemail option. Patient does not qualify for the [employer] grant due to not being in chemo,radiation,or oral chemo. She may apply for assistance through [employer] but income information is needed.

## 2016-05-22 ENCOUNTER — Encounter: Payer: Self-pay | Admitting: Hematology and Oncology

## 2016-05-22 NOTE — Progress Notes (Signed)
Attempted to contact patient again regarding her financial concerns, no answer but was able to leave a voicemail for her to return my call.

## 2016-05-23 ENCOUNTER — Other Ambulatory Visit: Payer: PRIVATE HEALTH INSURANCE

## 2016-06-29 ENCOUNTER — Ambulatory Visit
Admission: RE | Admit: 2016-06-29 | Discharge: 2016-06-29 | Disposition: A | Discharge status: Home / Self Care | Payer: PRIVATE HEALTH INSURANCE | Source: Ambulatory Visit | Attending: Radiation Oncology | Admitting: Radiation Oncology

## 2016-06-29 DIAGNOSIS — C50412 Malignant neoplasm of upper-outer quadrant of left female breast: Secondary | ICD-10-CM

## 2016-07-03 ENCOUNTER — Ambulatory Visit: Payer: PRIVATE HEALTH INSURANCE | Admitting: Hematology and Oncology

## 2016-07-11 NOTE — Assessment & Plan Note (Deleted)
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 Fatigue She does not have any significant hot flashes or myalgias. Oophorectomy was over 6 years ago. Recommendation: Join the [employer] live strong program as well as [facility] program.  Breast Lump: Left axillary swelling discomfort pain and burning sensation in the left breast. I would like up in her left breast mammogram and left axillary ultrasound for further evaluation. This could be either lymphedema are musculoskeletal pain.  Severe anxiety: On Xanax

## 2016-07-12 ENCOUNTER — Telehealth: Payer: Self-pay | Admitting: Hematology and Oncology

## 2016-07-12 ENCOUNTER — Ambulatory Visit: Payer: PRIVATE HEALTH INSURANCE | Admitting: Hematology and Oncology

## 2016-07-12 NOTE — Telephone Encounter (Signed)
Pt called to r/s 1/17 appt due to weather conditions. Gave pt new appt date/time

## 2016-07-17 NOTE — Assessment & Plan Note (Deleted)
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 Fatigue She does not have any significant hot flashes or myalgias. Oophorectomy was over 6 years ago. Recommendation: Join the [employer] live strong program as well as [facility] program.  Breast Lump: Left axillary swelling discomfort pain and burning sensation in the left breast. Mammogram and U/S: Post surg changes, fat necrosis  Severe anxiety: On Xanax And patient's plan to meet with the primary care physician to take up the care of for anxiety.  Weight reduction: I discussed with her extensively about intermittent fasting and encouraged her to lose weight. Severe fatigue: Patient reports to me that she has had fatigue long-term.  Return to clinic in 6 months

## 2016-07-18 ENCOUNTER — Telehealth: Payer: Self-pay | Admitting: Hematology and Oncology

## 2016-07-18 ENCOUNTER — Ambulatory Visit: Payer: PRIVATE HEALTH INSURANCE | Admitting: Hematology and Oncology

## 2016-07-18 NOTE — Telephone Encounter (Signed)
Pt called to r/s 1/24 appt to 1/31. Gave pt new appt date/time per request

## 2016-07-25 NOTE — Assessment & Plan Note (Deleted)
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 Fatigue She does not have any significant hot flashes or myalgias. Oophorectomy was over 6 years ago.  Severe anxiety: On Xanax Weight reduction: I discussed with her extensively about intermittent fasting and encouraged her to lose weight. Severe fatigue: Patient reports to me that she has had fatigue long-term.  Return to clinic in 1 year

## 2016-07-26 ENCOUNTER — Ambulatory Visit: Payer: PRIVATE HEALTH INSURANCE | Admitting: Hematology and Oncology

## 2016-08-06 ENCOUNTER — Emergency Department (HOSPITAL_COMMUNITY)
Admission: EM | Admit: 2016-08-06 | Discharge: 2016-08-06 | Disposition: A | Discharge status: Home / Self Care | Payer: PRIVATE HEALTH INSURANCE | Attending: Emergency Medicine | Admitting: Emergency Medicine

## 2016-08-06 ENCOUNTER — Encounter (HOSPITAL_COMMUNITY): Payer: Self-pay

## 2016-08-06 DIAGNOSIS — N644 Mastodynia: Secondary | ICD-10-CM | POA: Diagnosis present

## 2016-08-06 DIAGNOSIS — Z79899 Other long term (current) drug therapy: Secondary | ICD-10-CM | POA: Insufficient documentation

## 2016-08-06 DIAGNOSIS — Z853 Personal history of malignant neoplasm of breast: Secondary | ICD-10-CM | POA: Diagnosis not present

## 2016-08-06 DIAGNOSIS — J45909 Unspecified asthma, uncomplicated: Secondary | ICD-10-CM | POA: Diagnosis not present

## 2016-08-06 DIAGNOSIS — Z7982 Long term (current) use of aspirin: Secondary | ICD-10-CM | POA: Diagnosis not present

## 2016-08-06 NOTE — ED Provider Notes (Signed)
[facility] DEPT Provider Note   CSN: [id number] Arrival date & time: 08/06/16  1627     History   Chief Complaint Chief Complaint  Patient presents with  . Breast Pain    LEFT  . Fatigue    HPI [name] is a 41 y.o. female.  HPI    [name] is a 41 y.o. female, with a history of breast cancer, presenting to the ED with a "lump" in her left armpit/left breast that was noticed four months ago. She has been followed by a surgeon at the cancer center, Dr. Isidore Moos, and a medical oncologst, Nicholas Lose. She has noticed the mass growing accompanied by increasing tenderness over the past few months. She decided to come into the ED today because she noticed a rash to the left breast this morning. Rash is non-pruritic. She has been taking naproxen with minimal relief. Pain is moderate, aching, radiating across the left breast. She underwent a mammogram and breast US June 29, 2016. She received a letter in the mail stating that mass was ruled to be suspicious, but that they would keep an eye on it.  Pt endorses a tightness in the breast that has been constant over the last week. This tightness does not change with exertion or activities.  Pt had a f/u appt scheduled with her oncologist on 1/24. She cancelled and rescheduled for 1/31. She then missed this appt and was scheduled for sometime in March.   She was diagnosed with breast cancer in the left breast two years ago. She had a lumpectomy and two lymph nodes removed. She underwent radiation afterward.   Denies fever/chills, cough,   Past Medical History:  Diagnosis Date  . Asthma    no current med.  . Breast cancer of upper-outer quadrant of left female breast ([place]) 07/16/2014  . Heart murmur    as a child  . History of seizures    during pregnancies; last seizure 3 yrs. ago  . Seizures (HCC)    no medications  . Stress headaches     Patient Active Problem List   Diagnosis Date Noted  . Anxiety  07/05/2015  . Depression 03/23/2015  . Genetic testing 08/18/2014  . Family history of breast cancer   . Breast cancer of upper-outer quadrant of left female breast ([place]) 07/16/2014  . 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 12/20/2009  . SEIZURE DISORDER 07/07/2009  . DEPRESSION, RECURRENT 08/24/2008  . HYPERSOMNIA, IDIOPATHIC 08/24/2008  . 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 ABNORMAL RESULT, FUNCTION STUDY NEC 03/21/2007    Past Surgical History:  Procedure Laterality Date  . ABDOMINAL HYSTERECTOMY    . BREAST SURGERY     lumpectomy and lymph nodes  . CESAREAN SECTION  [id number]  . CESAREAN SECTION WITH BILATERAL TUBAL LIGATION  03/03/2003  . CYSTO  12/20/2009  . HYSTEROSCOPY W/ ENDOMETRIAL ABLATION  11/11/2007  . LYSIS OF ADHESION  12/20/2009  . TOTAL ABDOMINAL HYSTERECTOMY W/ BILATERAL SALPINGOOPHORECTOMY  12/20/2009  . TYMPANOSTOMY TUBE PLACEMENT      OB History    No data available       Home Medications    Prior to Admission medications   Medication Sig Start Date End Date Taking? Authorizing Provider  Aspirin-Acetaminophen-Caffeine (GOODY HEADACHE PO) Take 1 packet by mouth as needed (headache).   Yes Historical Provider, MD  Aspirin-Salicylamide-Caffeine (BC HEADACHE POWDER PO) Take 1 packet by mouth as needed (headache).   Yes Historical Provider, MD  naproxen sodium (  ANAPROX) 220 MG tablet Take 220 mg by mouth 2 (two) times daily with a meal.   Yes Historical Provider, MD  exemestane (AROMASIN) 25 MG tablet Take 1 tablet (25 mg total) by mouth daily after breakfast. Patient not taking: Reported on 08/06/2016 05/17/16   Nicholas Lose, MD    Family History Family History  Problem Relation Age of Onset  . Hypertension Mother   . Breast cancer Other     MGM's sister  . Breast cancer Maternal Grandmother     dx in her 38s    Social History Social History  Substance Use Topics  . Smoking status: Never Smoker  . Smokeless tobacco: Never Used    . Alcohol use Yes     Comment: occ     Allergies   Patient has no known allergies.   Review of Systems Review of Systems  Constitutional: Negative for chills and fever.  Respiratory: Negative for cough.   All other systems reviewed and are negative.    Physical Exam Updated Vital Signs BP 144/74 (BP Location: Right Arm)   Pulse 60   Temp 98 F (36.7 C) (Oral)   Resp 14   Ht 5\' 3"  (1.6 m)   Wt 90.7 kg   SpO2 99%   BMI 35.43 kg/m   Physical Exam  Constitutional: She appears well-developed and well-nourished. No distress.  HENT:  Head: Normocephalic and atraumatic.  Eyes: Conjunctivae are normal.  Neck: Neck supple.  Cardiovascular: Normal rate, regular rhythm, normal heart sounds and intact distal pulses.   Pulmonary/Chest: Effort normal and breath sounds normal. No respiratory distress. She exhibits tenderness. Left breast exhibits tenderness. Left breast exhibits no nipple discharge and no skin change.  Tenderness to the entire left breast. Point tenderness over the patient's lumpectomy site at the left lateral breast. Tenderness to the left axilla without noted lymphadenopathy.  No noted swelling or abnormal skin texture. No discrete mass noted. No area of erythema to suggest an abscess or cellulitis. No discharge from the nipple. Scattered, flat, erythematous rash noted to the left breast.  RN, Morey Hummingbird, served as chaperone during exam.  Abdominal: Soft. There is no tenderness. There is no guarding.  Musculoskeletal: She exhibits no edema.  Lymphadenopathy:    She has no cervical adenopathy.  Neurological: She is alert.  Skin: Skin is warm and dry. She is not diaphoretic.  Psychiatric: She has a normal mood and affect. Her behavior is normal.  Nursing note and vitals reviewed.    ED Treatments / Results  Labs (all labs ordered are listed, but only abnormal results are displayed) Labs Reviewed - No data to display  EKG  EKG Interpretation None        Radiology No results found.  Procedures Procedures (including critical care time)  Medications Ordered in ED Medications - No data to display   Initial Impression / Assessment and Plan / ED Course  I have reviewed the triage vital signs and the nursing notes.  Pertinent labs & imaging results that were available during my care of the patient were reviewed by me and considered in my medical decision making (see chart for details).     Patient presents with left breast tenderness for the past 4 months. Patient is nontoxic appearing, afebrile, not tachycardic, not tachypneic, not hypotensive, maintains SPO2 of 99% on room air, and is in no apparent distress. Patient has no signs of sepsis or other serious or life-threatening condition. Pt had appropriate follow up scheduled, but missed  these appointments. I do not note any signs of oncologic emergency. Pt was under the impression that we could evaluate her like her oncologist can. Patient was educated on what we could and could not evaluate and look for in the ED. Patient was offered a basic workup including lab work, chest xray, and EKG with the purpose and limitations of each item explained. Pt declined this evaluation. Pt was advised to call her oncologist's office tomorrow. Return precautions discussed. Pt voices understanding of all instructions and is comfortable with discharge.   Findings and plan of care discussed with Virgel Manifold, MD.    Vitals:   08/06/16 1630 08/06/16 1638  BP: 144/74   Pulse: 60   Resp: 14   Temp: 98 F (36.7 C)   TempSrc: Oral   SpO2: 99%   Weight:  90.7 kg  Height:  5\' 3"  (1.6 m)     Final Clinical Impressions(s) / ED Diagnoses   Final diagnoses:  Breast pain    New Prescriptions New Prescriptions   No medications on file     Layla Maw 08/06/16 1805    Virgel Manifold, MD 08/19/16 1039

## 2016-08-06 NOTE — Discharge Instructions (Signed)
Please call your oncologist tomorrow and update them on your current complaint.

## 2016-08-06 NOTE — ED Triage Notes (Signed)
PT C/O A "KNOT" UNDER THE LEFT ARM THAT APPEARED AFTER LYMPH NODE REMOVAL 1 1/2 YEARS AGO.PT STS HER DOCTOR HAS BEEN WATCHING IT, BUT IT HAS GROWN IN SIZE AND HAS BECOME PAINFUL. PT ALSO NOTICED A RED AND PAINFUL RASH TO THE LEFT BREAST SINCE THIS MORNING. PT STS SHE HAS HAD NO ENERGY FOR THE PAST WEEK. DENIES FEVER, COUGH, OR CHILLS.

## 2016-08-07 ENCOUNTER — Telehealth: Payer: Self-pay | Admitting: Hematology and Oncology

## 2016-08-07 NOTE — Telephone Encounter (Signed)
Pt called to r/s missed appt. Gave pt new appt date/time per request

## 2016-08-08 ENCOUNTER — Encounter: Payer: Self-pay | Admitting: Hematology and Oncology

## 2016-08-08 ENCOUNTER — Ambulatory Visit (HOSPITAL_BASED_OUTPATIENT_CLINIC_OR_DEPARTMENT_OTHER): Payer: PRIVATE HEALTH INSURANCE | Admitting: Hematology and Oncology

## 2016-08-08 DIAGNOSIS — Z170 Estrogen receptor positive status [ER+]: Secondary | ICD-10-CM | POA: Diagnosis not present

## 2016-08-08 DIAGNOSIS — F411 Generalized anxiety disorder: Secondary | ICD-10-CM

## 2016-08-08 DIAGNOSIS — C50412 Malignant neoplasm of upper-outer quadrant of left female breast: Secondary | ICD-10-CM | POA: Diagnosis not present

## 2016-08-08 MED ORDER — VALACYCLOVIR HCL 1 G PO TABS: 1000.0000 mg | ORAL_TABLET | Freq: Two times a day (BID) | ORAL | 0 refills | Status: DC

## 2016-08-08 MED ORDER — TAMOXIFEN CITRATE 20 MG PO TABS: 20.0000 mg | ORAL_TABLET | Freq: Every day | ORAL | 3 refills | Status: DC

## 2016-08-08 NOTE — Progress Notes (Signed)
Patient Care Team: No Pcp Per Patient as PCP - General (General Practice) Alphonsa Overall, MD as Consulting Physician (General Surgery) Nicholas Lose, MD as Consulting Physician (Hematology and Oncology) Thea Silversmith, MD (Inactive) as Consulting Physician (Radiation Oncology) Rockwell Germany, RN as Registered Nurse Mauro Kaufmann, RN as Registered Nurse Holley Bouche, NP as Nurse Practitioner (Nurse Practitioner)  DIAGNOSIS:  Encounter Diagnosis  Name Primary?  . Malignant neoplasm of upper-outer quadrant of left breast in female, estrogen receptor positive ([place])     SUMMARY OF ONCOLOGIC HISTORY:   Breast cancer of upper-outer quadrant of left female breast ([place])   07/14/2014 Mammogram    Left breast UOQ 2:00 position: 1.4 x 1.1 x 1.2 cm mass with indistinct, spiculated margins. Right breast negative.      07/14/2014 Breast US    1.5 cm hypoechoic, irregular mass with spiculated margins UOQ left breast at 2:00, 15 cm from nipple. Intramammary LN 1.4 cm superior to the mass (thin cortex) with another IM LN 3 cm inferior to the index mass with mild focal cortical thickening       07/14/2014 Initial Biopsy    Left breast needle core biopsy (UOQ, 2:00 position): IDC with DCIS; grade 2, ER 100%, PR 96%, HER-2 negative ratio 1.17, Ki-67 17%;  Intramammary LN needle core biopsy (outer left breast, posterior 3:00 position): negative for malignancy.      07/24/2014 Breast MRI    Left breast: 1.4 x 1.4 x 1.3 cm enhancing mass with irregular borders at 3 o'clock position. Right breast with no abnormal enhancement. No abnormal appearing LNs.       07/29/2014 Procedure    Genetic counseling/testing: Breast/Ovarian cancer panel ([facility]) shows no clinically significant variants in ATM, BARD1, BRCA1, BRCA2, BRIP1, CDH1, CHEK2, EPCAM, FANCC, MLH1, MSH2, MSH6, NBN, PALB2, PMS2, PTEN, RAD51C, RAD51D, STK11, TP53, and XRCC2.        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 Negative margins. ER+ (100%), PR+ (96%), HER-2 repeated and negative by FISH.  Ki-67 17%.      08/06/2014 Clinical Stage    Stage IA: T1c, N0 M0      08/06/2014 Oncotype testing    Recurrence score: 7 (6% ROR).  No chemo ([facility]).       09/16/2014 - 11/09/2014 Radiation Therapy    Adjuvant RT completed Pablo Ledger). Left breast/ 50.4 Gy at 1.8 Gy per fraction x 28 fractions.  Left breast boost/ 10 Gy at 2 Gy per fraction x 5 fractions      11/25/2014 -  Anti-estrogen oral therapy    Anastrozole 1 mg daily stopped due to headaches switched to exemestane 02/02/2015. Planned duration of treatment 5 years.       CHIEF COMPLIANT: Patient stop antiestrogen therapy. Complaining of left breast rash  INTERVAL HISTORY: [name] is a 41 year old with above-mentioned history of left breast cancer treated with lumpectomy followed by radiation and is currently unable to take antiestrogen therapy. Initially she was on anastrozole which was then exchanged exemestane but her insurance was not going to cover it so she has not been on any antiestrogen therapy. Today she comes in complaining of a rash in the left breast is a maculopapular rash extending from the left axilla and probably frontal lobe breast. It is not bothering her but she certainly worried about it. She also has a palpable lump in  the left axilla.  REVIEW OF SYSTEMS:   Constitutional: Denies fevers, chills or abnormal weight loss, complains of severe fatigue Eyes: Denies blurriness of vision Ears, nose, mouth, throat, and face: Denies mucositis or sore throat Respiratory: Denies cough, dyspnea or wheezes Cardiovascular: Denies palpitation, chest discomfort Gastrointestinal:  Denies nausea, heartburn or change in bowel habits Skin: Denies abnormal skin rashes Lymphatics: Denies new lymphadenopathy or easy bruising Neurological:Denies  numbness, tingling or new weaknesses Behavioral/Psych: Mood is stable, no new changes  Extremities: No lower extremity edema Breast: Maculopapular rash on the left breast All other systems were reviewed with the patient and are negative.  I have reviewed the past medical history, past surgical history, social history and family history with the patient and they are unchanged from previous note.  ALLERGIES:  has No Known Allergies.  MEDICATIONS:  Current Outpatient Prescriptions  Medication Sig Dispense Refill  . Aspirin-Acetaminophen-Caffeine (GOODY HEADACHE PO) Take 1 packet by mouth as needed (headache).    . Aspirin-Salicylamide-Caffeine (BC HEADACHE POWDER PO) Take 1 packet by mouth as needed (headache).    Marland Kitchen exemestane (AROMASIN) 25 MG tablet Take 1 tablet (25 mg total) by mouth daily after breakfast. (Patient not taking: Reported on 08/06/2016) 90 tablet 3  . naproxen sodium (ANAPROX) 220 MG tablet Take 220 mg by mouth 2 (two) times daily with a meal.    . tamoxifen (NOLVADEX) 20 MG tablet Take 1 tablet (20 mg total) by mouth daily. 90 tablet 3  . valACYclovir (VALTREX) 1000 MG tablet Take 1 tablet (1,000 mg total) by mouth 2 (two) times daily. 14 tablet 0   No current facility-administered medications for this visit.     PHYSICAL EXAMINATION: ECOG PERFORMANCE STATUS: 1 - Symptomatic but completely ambulatory  Vitals:   08/08/16 1428  BP: 123/68  Pulse: 74  Resp: 17  Temp: 97.5 F (36.4 C)   Filed Weights   08/08/16 1428  Weight: 256 lb 12.8 oz (116.5 kg)    GENERAL:alert, no distress and comfortable SKIN: skin color, texture, turgor are normal, no rashes or significant lesions EYES: normal, Conjunctiva are pink and non-injected, sclera clear OROPHARYNX:no exudate, no erythema and lips, buccal mucosa, and tongue normal  NECK: supple, thyroid normal size, non-tender, without nodularity LYMPH:  no palpable lymphadenopathy in the cervical, axillary or inguinal LUNGS:  clear to auscultation and percussion with normal breathing effort HEART: regular rate & rhythm and no murmurs and no lower extremity edema ABDOMEN:abdomen soft, non-tender and normal bowel sounds MUSCULOSKELETAL:no cyanosis of digits and no clubbing  NEURO: alert & oriented x 3 with fluent speech, no focal motor/sensory deficits EXTREMITIES: No lower extremity edema BREAST: Maculopapular rash in the left breast. Small palpable left axillary lymph node. (exam performed in the presence of a chaperone)  LABORATORY DATA:  I have reviewed the data as listed   Chemistry      Component Value Date/Time   NA 141 02/02/2015 1508   K 3.7 02/02/2015 1508   CL 105 12/09/2014 1422   CO2 28 02/02/2015 1508   BUN 8.6 02/02/2015 1508   CREATININE 1.0 02/02/2015 1508      Component Value Date/Time   CALCIUM 9.3 02/02/2015 1508   ALKPHOS 102 02/02/2015 1508   AST 21 02/02/2015 1508   ALT 19 02/02/2015 1508   BILITOT 0.28 02/02/2015 1508       Lab Results  Component Value Date   WBC 6.3 02/02/2015   HGB 12.5 02/02/2015   HCT 38.5 02/02/2015   MCV  82.4 02/02/2015   PLT 298 02/02/2015   NEUTROABS 4.5 02/02/2015    ASSESSMENT & PLAN:  Breast cancer of upper-outer quadrant of left female breast ([place]) Left breast lumpectomy: Invasive ductal carcinoma with DCIS, no LVI, 2 sentinel nodes negative, 1.3 cm, grade 1, 100%, PR 96 wasn't, HER-2 negative Ki-67 17% T1 cN0 M0 stage IA Oncotype DX recurrence score is 7, 6% ROR status post radiation completed May 2016, patient had hysterectomy with bilateral salpingo-oophorectomy 2011) Anastrozole 1 mg daily started June 2016 stopped July 2016 due to severe headaches  Current treatment: Unable to tolerate antiestrogen therapy. We may initiate treatment with tamoxifen when she returns.  Breast Lump:  Mammogram and U/S: To be repeated. Previously it showed postsurgical changes and fat necrosis. Severe anxiety: On Xanax  Weight reduction:. Severe fatigue:  Patient reports to me that she has had fatigue long-term.  Rash on the breast: I suspect this may be shingles. I sent a prescription for Valtrex. If it does not get better then she'll need to see dermatology. Return to clinic in 3 months for follow-up to discuss starting tamoxifen.   I spent 25 minutes talking to the patient of which more than half was spent in counseling and coordination of care.  Orders Placed This Encounter  Procedures  . MM DIAG BREAST TOMO UNI LEFT    Standing Status:   Future    Standing Expiration Date:   10/06/2017    Order Specific Question:   Reason for Exam (SYMPTOM  OR DIAGNOSIS REQUIRED)    Answer:   left breast pain wwith H/O breast cancer    Order Specific Question:   Is the patient pregnant?    Answer:   No    Order Specific Question:   Preferred imaging location?    Answer:   [facility]  . US BREAST LTD UNI LEFT INC AXILLA    Standing Status:   Future    Standing Expiration Date:   10/06/2017    Order Specific Question:   Reason for Exam (SYMPTOM  OR DIAGNOSIS REQUIRED)    Answer:   left axillary LN with h/o breast cancer    Order Specific Question:   Preferred imaging location?    Answer:   [facility]   The patient has a good understanding of the overall plan. she agrees with it. she will call with any problems that may develop before the next visit here.   Rulon Eisenmenger, MD 08/08/16

## 2016-08-08 NOTE — Assessment & Plan Note (Signed)
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 Fatigue She does not have any significant hot flashes or myalgias. Oophorectomy was over 6 years ago. Recommendation: Join the [employer] live strong program as well as [facility] program.  Breast Lump: Left axillary swelling discomfort pain and burning sensation in the left breast. Mammogram and U/S: Post surg changes, fat necrosis Severe anxiety: On Xanax  Weight reduction: I discussed with her extensively about intermittent fasting and encouraged her to lose weight. Severe fatigue: Patient reports to me that she has had fatigue long-term.  Return to clinic in 1 yr

## 2016-08-12 ENCOUNTER — Telehealth: Payer: Self-pay | Admitting: Hematology and Oncology

## 2016-08-12 NOTE — Telephone Encounter (Signed)
Lvm advising appt 5/14 @ 2.45pm.

## 2016-08-14 ENCOUNTER — Ambulatory Visit (HOSPITAL_COMMUNITY)
Admission: EM | Admit: 2016-08-14 | Discharge: 2016-08-14 | Discharge status: Against Medical Advice | Payer: PRIVATE HEALTH INSURANCE

## 2016-08-14 ENCOUNTER — Emergency Department (HOSPITAL_COMMUNITY)
Admission: EM | Admit: 2016-08-14 | Discharge: 2016-08-14 | Discharge status: Against Medical Advice | Payer: PRIVATE HEALTH INSURANCE

## 2016-08-14 NOTE — ED Notes (Signed)
Pt called for triage x2. No answer  

## 2016-08-15 NOTE — Telephone Encounter (Signed)
Spoke with patient today re mammo/us at the [facility] 08/17/17 @ 9:20 am. Also confirmed 5/14 f/u. Spoke with Cherish at the [facility] and patient is due for her annual [facility] will perform bilateral mammo as patient is now due and follow with the left breast US. Patient aware.

## 2016-08-17 ENCOUNTER — Other Ambulatory Visit: Payer: PRIVATE HEALTH INSURANCE

## 2016-08-18 ENCOUNTER — Ambulatory Visit
Admission: RE | Admit: 2016-08-18 | Discharge: 2016-08-18 | Disposition: A | Discharge status: Home / Self Care | Payer: PRIVATE HEALTH INSURANCE | Source: Ambulatory Visit | Attending: Hematology and Oncology | Admitting: Hematology and Oncology

## 2016-08-18 ENCOUNTER — Other Ambulatory Visit: Payer: Self-pay | Admitting: Hematology and Oncology

## 2016-08-18 DIAGNOSIS — C50412 Malignant neoplasm of upper-outer quadrant of left female breast: Secondary | ICD-10-CM

## 2016-08-18 DIAGNOSIS — Z170 Estrogen receptor positive status [ER+]: Principal | ICD-10-CM

## 2016-08-18 DIAGNOSIS — N6320 Unspecified lump in the left breast, unspecified quadrant: Secondary | ICD-10-CM

## 2016-08-21 ENCOUNTER — Ambulatory Visit
Admission: RE | Admit: 2016-08-21 | Discharge: 2016-08-21 | Disposition: A | Discharge status: Home / Self Care | Payer: PRIVATE HEALTH INSURANCE | Source: Ambulatory Visit | Attending: Hematology and Oncology | Admitting: Hematology and Oncology

## 2016-08-21 ENCOUNTER — Other Ambulatory Visit: Payer: Self-pay | Admitting: Hematology and Oncology

## 2016-08-21 DIAGNOSIS — C50412 Malignant neoplasm of upper-outer quadrant of left female breast: Secondary | ICD-10-CM

## 2016-08-21 DIAGNOSIS — N6320 Unspecified lump in the left breast, unspecified quadrant: Secondary | ICD-10-CM

## 2016-08-21 DIAGNOSIS — Z170 Estrogen receptor positive status [ER+]: Principal | ICD-10-CM

## 2016-08-21 HISTORY — PX: BREAST BIOPSY: SHX20

## 2016-08-23 ENCOUNTER — Encounter (HOSPITAL_COMMUNITY): Payer: Self-pay | Admitting: *Deleted

## 2016-08-23 ENCOUNTER — Emergency Department (HOSPITAL_COMMUNITY)
Admission: EM | Admit: 2016-08-23 | Discharge: 2016-08-23 | Disposition: A | Discharge status: Home / Self Care | Payer: PRIVATE HEALTH INSURANCE | Attending: Emergency Medicine | Admitting: Emergency Medicine

## 2016-08-23 DIAGNOSIS — M79602 Pain in left arm: Secondary | ICD-10-CM | POA: Diagnosis present

## 2016-08-23 DIAGNOSIS — J45909 Unspecified asthma, uncomplicated: Secondary | ICD-10-CM | POA: Insufficient documentation

## 2016-08-23 DIAGNOSIS — L03114 Cellulitis of left upper limb: Secondary | ICD-10-CM | POA: Diagnosis not present

## 2016-08-23 DIAGNOSIS — R590 Localized enlarged lymph nodes: Secondary | ICD-10-CM

## 2016-08-23 DIAGNOSIS — C50412 Malignant neoplasm of upper-outer quadrant of left female breast: Secondary | ICD-10-CM | POA: Diagnosis not present

## 2016-08-23 MED ORDER — DOXYCYCLINE HYCLATE 100 MG PO CAPS: 100.0000 mg | ORAL_CAPSULE | Freq: Two times a day (BID) | ORAL | 0 refills | Status: DC

## 2016-08-23 MED ORDER — DOXYCYCLINE HYCLATE 100 MG PO TABS
100.0000 mg | ORAL_TABLET | Freq: Once | ORAL | Status: AC
  Administered 2016-08-23: 100 mg via ORAL
  Filled 2016-08-23: qty 1

## 2016-08-23 MED ORDER — NAPROXEN 500 MG PO TABS
500.0000 mg | ORAL_TABLET | Freq: Once | ORAL | Status: AC
  Administered 2016-08-23: 500 mg via ORAL
  Filled 2016-08-23: qty 1

## 2016-08-23 MED ORDER — NAPROXEN 500 MG PO TABS
500.0000 mg | ORAL_TABLET | Freq: Two times a day (BID) | ORAL | 0 refills | Status: DC
Start: 2016-08-23 — End: 2016-11-09

## 2016-08-23 NOTE — Discharge Instructions (Signed)
Take the antibiotic as directed for the next 7 days. Take the Naprosyn twice a day as needed. If symptoms do not improve over the next few days to make appointment to follow-up with your hematology oncology doctor. Return for any new or worse symptoms. Symptoms seem to be consistent with a swollen lymph node at the elbow and cellulitis which is a skin infection.

## 2016-08-23 NOTE — ED Provider Notes (Signed)
[facility] DEPT Provider Note   CSN: [id number] Arrival date & time: 08/23/16  1935     History   Chief Complaint Chief Complaint  Patient presents with  . Arm Pain    HPI [name] is a 41 y.o. female.  Patient followed by hematology oncology here for breast cancer. Patient's had several biopsies of the left breast area recently that a been negative for malignancy. Patient recently seen by her hematologist oncologist around mid-February there was a rash on the left breast and there was a palpable axillary lymph node. Patient states that was biopsied one week ago. Patient now with concern for the left elbow area with some redness and tenderness and there seems to be a swollen bump there. No history of any injury. There is an abrasion distally on the left forearm patient states she does not have any cats and not sure how she got that abrasion.      Past Medical History:  Diagnosis Date  . Asthma    no current med.  . Breast cancer of upper-outer quadrant of left female breast ([place]) 07/16/2014  . Heart murmur    as a child  . History of seizures    during pregnancies; last seizure 3 yrs. ago  . Seizures (HCC)    no medications  . Stress headaches     Patient Active Problem List   Diagnosis Date Noted  . Anxiety 07/05/2015  . Depression 03/23/2015  . Genetic testing 08/18/2014  . Family history of breast cancer   . Breast cancer of upper-outer quadrant of left female breast ([place]) 07/16/2014  . 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 12/20/2009  . SEIZURE DISORDER 07/07/2009  . DEPRESSION, RECURRENT 08/24/2008  . HYPERSOMNIA, IDIOPATHIC 08/24/2008  . 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 ABNORMAL RESULT, FUNCTION STUDY NEC 03/21/2007    Past Surgical History:  Procedure Laterality Date  . ABDOMINAL HYSTERECTOMY    . BREAST SURGERY     lumpectomy and lymph nodes  . CESAREAN SECTION  [id number]  . CESAREAN SECTION WITH BILATERAL TUBAL  LIGATION  03/03/2003  . CYSTO  12/20/2009  . HYSTEROSCOPY W/ ENDOMETRIAL ABLATION  11/11/2007  . LYSIS OF ADHESION  12/20/2009  . TOTAL ABDOMINAL HYSTERECTOMY W/ BILATERAL SALPINGOOPHORECTOMY  12/20/2009  . TYMPANOSTOMY TUBE PLACEMENT      OB History    No data available       Home Medications    Prior to Admission medications   Medication Sig Start Date End Date Taking? Authorizing Provider  acetaminophen (TYLENOL) 500 MG tablet Take 1,000 mg by mouth every 6 (six) hours as needed for mild pain.   Yes Historical Provider, MD  tamoxifen (NOLVADEX) 20 MG tablet Take 1 tablet (20 mg total) by mouth daily. 08/08/16  Yes Nicholas Lose, MD  doxycycline (VIBRAMYCIN) 100 MG capsule Take 1 capsule (100 mg total) by mouth 2 (two) times daily. 08/23/16   Fredia Sorrow, MD  exemestane (AROMASIN) 25 MG tablet Take 1 tablet (25 mg total) by mouth daily after breakfast. Patient not taking: Reported on 08/06/2016 05/17/16   Nicholas Lose, MD  naproxen (NAPROSYN) 500 MG tablet Take 1 tablet (500 mg total) by mouth 2 (two) times daily. 08/23/16   Fredia Sorrow, MD  valACYclovir (VALTREX) 1000 MG tablet Take 1 tablet (1,000 mg total) by mouth 2 (two) times daily. Patient not taking: Reported on 08/23/2016 08/08/16   Nicholas Lose, MD    Family History Family History  Problem Relation Age of  Onset  . Hypertension Mother   . Breast cancer Other     MGM's sister  . Breast cancer Maternal Grandmother     dx in her 1s    Social History Social History  Substance Use Topics  . Smoking status: Never Smoker  . Smokeless tobacco: Never Used  . Alcohol use Yes     Comment: occ     Allergies   Patient has no known allergies.   Review of Systems Review of Systems  Constitutional: Negative for fever.  Eyes: Negative for redness.  Respiratory: Negative for shortness of breath.   Cardiovascular: Negative for chest pain.  Gastrointestinal: Negative for abdominal pain.  Skin: Positive for rash and  wound.  Neurological: Negative for headaches.  Hematological: Does not bruise/bleed easily.  Psychiatric/Behavioral: Negative for confusion.     Physical Exam Updated Vital Signs BP (!) 108/52 (BP Location: Right Arm)   Pulse 83   Temp 98.3 F (36.8 C)   Resp 18   SpO2 96%   Physical Exam  Constitutional: She is oriented to person, place, and time. She appears well-developed and well-nourished. No distress.  HENT:  Head: Normocephalic and atraumatic.  Eyes: EOM are normal. Pupils are equal, round, and reactive to light.  Neck: Normal range of motion. Neck supple.  Cardiovascular: Normal rate, regular rhythm and normal heart sounds.   Pulmonary/Chest: Effort normal and breath sounds normal.  Abdominal: Soft. There is no tenderness.  Musculoskeletal: She exhibits tenderness.  Left breast area without the any tenderness no exudate axillary tenderness no axillary lymph nodes palpable. Left elbow area with the palpable area tenderness it probably represents a lymph node. Measuring about 1 cm. Surrounding area of erythema about 7 cm. Good radial pulse distally. No sniffing pain with range of motion of the elbow shoulder or wrist. No red streaking. Distal on the left forearm there is a linear scratch measuring about 4 cm without any secondary infection.  Neurological: She is alert and oriented to person, place, and time. No cranial nerve deficit or sensory deficit. She exhibits normal muscle tone. Coordination normal.  Skin: Skin is warm.  Nursing note and vitals reviewed.    ED Treatments / Results  Labs (all labs ordered are listed, but only abnormal results are displayed) Labs Reviewed - No data to display  EKG  EKG Interpretation None       Radiology No results found.  Procedures Procedures (including critical care time)  Medications Ordered in ED Medications  doxycycline (VIBRA-TABS) tablet 100 mg (not administered)  naproxen (NAPROSYN) tablet 500 mg (not  administered)     Initial Impression / Assessment and Plan / ED Course  I have reviewed the triage vital signs and the nursing notes.  Pertinent labs & imaging results that were available during my care of the patient were reviewed by me and considered in my medical decision making (see chart for details).     Patient with swollen lymph node at the epi condyler aspect of her left elbow with some surrounding erythema. Seems to be consistent with a cellulitis distally on the forearm there is a linear scratch or abrasion. Patient states that she does not have cats or has not been exposed to cats.  Patient no longer has any axillary lymph nodes. Patient's recent visit where there was some swelling there and a concern for the rash on the left breast both have resolved. Patient also did have a biopsy of that area within the last week.  The  axillary area or the concerns with the left breast have the no correlation to what is going on with the left elbow. Feel that this needs to be treated with doxycycline close follow-up and will treat with Naprosyn for the pain.  Final Clinical Impressions(s) / ED Diagnoses   Final diagnoses:  Left arm pain  Cellulitis of left upper extremity  Epitrochlear adenopathy    New Prescriptions New Prescriptions   DOXYCYCLINE (VIBRAMYCIN) 100 MG CAPSULE    Take 1 capsule (100 mg total) by mouth 2 (two) times daily.   NAPROXEN (NAPROSYN) 500 MG TABLET    Take 1 tablet (500 mg total) by mouth 2 (two) times daily.     Fredia Sorrow, MD 08/23/16 2253

## 2016-09-22 ENCOUNTER — Emergency Department (HOSPITAL_COMMUNITY): Payer: PRIVATE HEALTH INSURANCE

## 2016-09-22 ENCOUNTER — Encounter (HOSPITAL_COMMUNITY): Payer: Self-pay | Admitting: Emergency Medicine

## 2016-09-22 ENCOUNTER — Emergency Department (HOSPITAL_COMMUNITY)
Admission: EM | Admit: 2016-09-22 | Discharge: 2016-09-22 | Disposition: A | Discharge status: Home / Self Care | Payer: PRIVATE HEALTH INSURANCE | Attending: Emergency Medicine | Admitting: Emergency Medicine

## 2016-09-22 DIAGNOSIS — J069 Acute upper respiratory infection, unspecified: Secondary | ICD-10-CM | POA: Insufficient documentation

## 2016-09-22 DIAGNOSIS — J3089 Other allergic rhinitis: Secondary | ICD-10-CM | POA: Diagnosis not present

## 2016-09-22 DIAGNOSIS — B9789 Other viral agents as the cause of diseases classified elsewhere: Secondary | ICD-10-CM

## 2016-09-22 DIAGNOSIS — Z853 Personal history of malignant neoplasm of breast: Secondary | ICD-10-CM | POA: Insufficient documentation

## 2016-09-22 DIAGNOSIS — R05 Cough: Secondary | ICD-10-CM | POA: Diagnosis present

## 2016-09-22 MED ORDER — FLUTICASONE PROPIONATE 50 MCG/ACT NA SUSP: 2.0000 | Freq: Every day | NASAL | 0 refills | Status: DC

## 2016-09-22 MED ORDER — IPRATROPIUM-ALBUTEROL 0.5-2.5 (3) MG/3ML IN SOLN
3.0000 mL | Freq: Once | RESPIRATORY_TRACT | Status: AC
  Administered 2016-09-22: 3 mL via RESPIRATORY_TRACT
  Filled 2016-09-22: qty 3

## 2016-09-22 MED ORDER — CETIRIZINE HCL 10 MG PO TABS: 10.0000 mg | ORAL_TABLET | Freq: Every day | ORAL | 1 refills | Status: DC

## 2016-09-22 MED ORDER — ALBUTEROL SULFATE HFA 108 (90 BASE) MCG/ACT IN AERS
2.0000 | INHALATION_SPRAY | Freq: Once | RESPIRATORY_TRACT | Status: AC
  Administered 2016-09-22: 2 via RESPIRATORY_TRACT
  Filled 2016-09-22: qty 6.7

## 2016-09-22 MED ORDER — BENZONATATE 100 MG PO CAPS: 100.0000 mg | ORAL_CAPSULE | Freq: Three times a day (TID) | ORAL | 0 refills | Status: DC | PRN

## 2016-09-22 NOTE — ED Provider Notes (Signed)
[facility] DEPT Provider Note   CSN: [id number] Arrival date & time: 09/22/16  1517  By signing my name below, I, Neta Mends, attest that this documentation has been prepared under the direction and in the presence of Waynetta Pean, PA-C. Electronically Signed: Neta Mends, ED Scribe. 09/22/2016. 9:04 AM.    History   Chief Complaint Chief Complaint  Patient presents with  . Cough    The history is provided by the patient and medical records. No language interpreter was used.   HPI Comments:  [name] is a 41 y.o. female with PMHx of asthma who presents to the Emergency Department complaining of constant nasal congestion and runny nose x 4 days. Pt complains of associated dry cough, ear pressure, post-nasal drip, chest tightness. Pt took OTC allergy medication with no relief. Denies fever, chest pain, trouble breathing, abdominal pain, or rashes.   Past Medical History:  Diagnosis Date  . Asthma    no current med.  . Breast cancer of upper-outer quadrant of left female breast ([place]) 07/16/2014  . Heart murmur    as a child  . History of seizures    during pregnancies; last seizure 3 yrs. ago  . Seizures (HCC)    no medications  . Stress headaches     Patient Active Problem List   Diagnosis Date Noted  . Anxiety 07/05/2015  . Depression 03/23/2015  . Genetic testing 08/18/2014  . Family history of breast cancer   . Breast cancer of upper-outer quadrant of left female breast ([place]) 07/16/2014  . 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 12/20/2009  . SEIZURE DISORDER 07/07/2009  . DEPRESSION, RECURRENT 08/24/2008  . HYPERSOMNIA, IDIOPATHIC 08/24/2008  . 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 ABNORMAL RESULT, FUNCTION STUDY NEC 03/21/2007    Past Surgical History:  Procedure Laterality Date  . ABDOMINAL HYSTERECTOMY    . BREAST SURGERY     lumpectomy and lymph nodes  . CESAREAN SECTION  [id number]  . CESAREAN SECTION WITH  BILATERAL TUBAL LIGATION  03/03/2003  . CYSTO  12/20/2009  . HYSTEROSCOPY W/ ENDOMETRIAL ABLATION  11/11/2007  . LYSIS OF ADHESION  12/20/2009  . TOTAL ABDOMINAL HYSTERECTOMY W/ BILATERAL SALPINGOOPHORECTOMY  12/20/2009  . TYMPANOSTOMY TUBE PLACEMENT      OB History    No data available       Home Medications    Prior to Admission medications   Medication Sig Start Date End Date Taking? Authorizing Provider  acetaminophen (TYLENOL) 500 MG tablet Take 1,000 mg by mouth every 6 (six) hours as needed for mild pain.    Historical Provider, MD  benzonatate (TESSALON) 100 MG capsule Take 1 capsule (100 mg total) by mouth 3 (three) times daily as needed for cough. 09/22/16   Waynetta Pean, PA-C  cetirizine (ZYRTEC ALLERGY) 10 MG tablet Take 1 tablet (10 mg total) by mouth daily. 09/22/16   Waynetta Pean, PA-C  doxycycline (VIBRAMYCIN) 100 MG capsule Take 1 capsule (100 mg total) by mouth 2 (two) times daily. 08/23/16   Fredia Sorrow, MD  exemestane (AROMASIN) 25 MG tablet Take 1 tablet (25 mg total) by mouth daily after breakfast. Patient not taking: Reported on 08/06/2016 05/17/16   Nicholas Lose, MD  fluticasone (FLONASE) 50 MCG/ACT nasal spray Place 2 sprays into both nostrils daily. 09/22/16   Waynetta Pean, PA-C  naproxen (NAPROSYN) 500 MG tablet Take 1 tablet (500 mg total) by mouth 2 (two) times daily. 08/23/16   Fredia Sorrow, MD  tamoxifen (  NOLVADEX) 20 MG tablet Take 1 tablet (20 mg total) by mouth daily. 08/08/16   Nicholas Lose, MD  valACYclovir (VALTREX) 1000 MG tablet Take 1 tablet (1,000 mg total) by mouth 2 (two) times daily. Patient not taking: Reported on 08/23/2016 08/08/16   Nicholas Lose, MD    Family History Family History  Problem Relation Age of Onset  . Hypertension Mother   . Breast cancer Other     MGM's sister  . Breast cancer Maternal Grandmother     dx in her 59s    Social History Social History  Substance Use Topics  . Smoking status: Never Smoker  .  Smokeless tobacco: Never Used  . Alcohol use Yes     Comment: occ     Allergies   Patient has no known allergies.   Review of Systems Review of Systems  Constitutional: Negative for chills and fever.  HENT: Positive for congestion, ear pain, postnasal drip, rhinorrhea and sneezing. Negative for sore throat and trouble swallowing.   Eyes: Negative for redness.  Respiratory: Positive for cough and wheezing. Negative for shortness of breath.   Cardiovascular: Negative for chest pain.  Gastrointestinal: Negative for abdominal pain and vomiting.  Skin: Negative for rash.  Neurological: Negative for light-headedness.     Physical Exam Updated Vital Signs BP 109/68   Pulse 73   Temp 98.1 F (36.7 C)   Resp 20   SpO2 99%   Physical Exam  Constitutional: She appears well-developed and well-nourished. No distress.  Nontoxic appearing.  HENT:  Head: Normocephalic and atraumatic.  Right Ear: External ear normal.  Left Ear: External ear normal.  Mild, clear middle ear effusion bilaterally. Boggy nasal turbinates and rhinorrhea bilaterally. No tonsillar hypertrophy or exudates. Post-nasal drip present. No TM erythema or loss of landmarks.   Eyes: Conjunctivae are normal. Pupils are equal, round, and reactive to light. Right eye exhibits no discharge. Left eye exhibits no discharge.  Neck: Normal range of motion. Neck supple. No JVD present. No tracheal deviation present.  Cardiovascular: Normal rate, regular rhythm, normal heart sounds and intact distal pulses.   Pulmonary/Chest: Effort normal and breath sounds normal. No stridor. No respiratory distress. She has no wheezes. She has no rales.  Lungs are clear to ascultation bilaterally. Symmetric chest expansion bilaterally. No increased work of breathing. No rales or rhonchi.   Abdominal: Soft. There is no tenderness.  Musculoskeletal: She exhibits no edema.  Lymphadenopathy:    She has no cervical adenopathy.  Neurological: She  is alert. Coordination normal.  Skin: Skin is warm and dry. Capillary refill takes less than 2 seconds. No rash noted. She is not diaphoretic. No erythema. No pallor.  Psychiatric: She has a normal mood and affect. Her behavior is normal.  Nursing note and vitals reviewed.    ED Treatments / Results  DIAGNOSTIC STUDIES:  Oxygen Saturation is 99% on RA, normal by my interpretation.    COORDINATION OF CARE:  9:03 AM Discussed treatment plan with pt at bedside and pt agreed to plan.   Labs (all labs ordered are listed, but only abnormal results are displayed) Labs Reviewed - No data to display  EKG  EKG Interpretation None       Radiology Dg Chest 2 View  Result Date: 09/22/2016 CLINICAL DATA:  Severe cough, shortness of breath, vomiting and sore throat. EXAM: CHEST  2 VIEW COMPARISON:  07/25/2015 FINDINGS: Heart and mediastinal contours are within normal limits. No focal opacities or effusions. No acute bony  abnormality. IMPRESSION: No active cardiopulmonary disease. Electronically Signed   By: Rolm Baptise M.D.   On: 09/22/2016 09:36    Procedures Procedures (including critical care time)  Medications Ordered in ED Medications  albuterol (PROVENTIL HFA;VENTOLIN HFA) 108 (90 Base) MCG/ACT inhaler 2 puff (not administered)  ipratropium-albuterol (DUONEB) 0.5-2.5 (3) MG/3ML nebulizer solution 3 mL (3 mLs Nebulization Given 09/22/16 0909)     Initial Impression / Assessment and Plan / ED Course  I have reviewed the triage vital signs and the nursing notes.  Pertinent labs & imaging results that were available during my care of the patient were reviewed by me and considered in my medical decision making (see chart for details).    This  is a 41 y.o. female with PMHx of asthma who presents to the Emergency Department complaining of constant nasal congestion and runny nose x 4 days. Pt complains of associated dry cough, ear pressure, post-nasal drip, chest tightness.  Pt CXR  negative for acute infiltrate. Patients symptoms are consistent with URI, likely viral etiology. Discussed that antibiotics are not indicated for viral infections. Pt will be discharged with symptomatic treatment.  I advised the patient to follow-up with their primary care provider this week. I advised the patient to return to the emergency department with new or worsening symptoms or new concerns. The patient verbalized understanding and agreement with plan.    Final Clinical Impressions(s) / ED Diagnoses   Final diagnoses:  Viral URI with cough  Acute allergic rhinitis due to other allergen, unspecified seasonality    New Prescriptions New Prescriptions   BENZONATATE (TESSALON) 100 MG CAPSULE    Take 1 capsule (100 mg total) by mouth 3 (three) times daily as needed for cough.   CETIRIZINE (ZYRTEC ALLERGY) 10 MG TABLET    Take 1 tablet (10 mg total) by mouth daily.   FLUTICASONE (FLONASE) 50 MCG/ACT NASAL SPRAY    Place 2 sprays into both nostrils daily.    I personally performed the services described in this documentation, which was scribed in my presence. The recorded information has been reviewed and is accurate.       Waynetta Pean, PA-C 09/22/16 [place], MD 09/22/16 [phone]

## 2016-09-22 NOTE — ED Triage Notes (Signed)
Pt reports non productive cough x4 days, denies fever or chills.

## 2016-09-22 NOTE — ED Notes (Signed)
Pt to xray

## 2016-11-06 ENCOUNTER — Ambulatory Visit: Payer: PRIVATE HEALTH INSURANCE | Admitting: Hematology and Oncology

## 2016-11-06 NOTE — Assessment & Plan Note (Deleted)
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 We may initiate treatment with tamoxifen when she returns.  Breast Lump:  Mammogram and U/S 08/18/2016:  indeterminate superficial 7 mm nodule adjacent to the prior lumpectomy site. Biopsy revealed dense stromal fibrosis no malignancy.  Severe anxiety: On Xanax  Severe fatigue: Patient reports to me that she has had fatigue long-term.  Rash on the breast: shingles treated with Valtrex. Return to clinic in 3 months for follow-up to discuss starting tamoxifen. 

## 2016-11-07 ENCOUNTER — Inpatient Hospital Stay (HOSPITAL_COMMUNITY): Payer: PRIVATE HEALTH INSURANCE

## 2016-11-07 ENCOUNTER — Emergency Department (HOSPITAL_COMMUNITY): Payer: PRIVATE HEALTH INSURANCE

## 2016-11-07 ENCOUNTER — Encounter (HOSPITAL_COMMUNITY): Payer: Self-pay | Admitting: Emergency Medicine

## 2016-11-07 ENCOUNTER — Inpatient Hospital Stay (HOSPITAL_COMMUNITY)
Admission: EM | Admit: 2016-11-07 | Discharge: 2016-11-09 | DRG: 100 | Disposition: A | Discharge status: Home / Self Care | Payer: PRIVATE HEALTH INSURANCE | Attending: Internal Medicine | Admitting: Internal Medicine

## 2016-11-07 DIAGNOSIS — G4089 Other seizures: Secondary | ICD-10-CM | POA: Diagnosis present

## 2016-11-07 DIAGNOSIS — E669 Obesity, unspecified: Secondary | ICD-10-CM | POA: Diagnosis present

## 2016-11-07 DIAGNOSIS — R109 Unspecified abdominal pain: Secondary | ICD-10-CM

## 2016-11-07 DIAGNOSIS — Z853 Personal history of malignant neoplasm of breast: Secondary | ICD-10-CM

## 2016-11-07 DIAGNOSIS — G40909 Epilepsy, unspecified, not intractable, without status epilepticus: Principal | ICD-10-CM | POA: Diagnosis present

## 2016-11-07 DIAGNOSIS — Z6841 Body Mass Index (BMI) 40.0 and over, adult: Secondary | ICD-10-CM

## 2016-11-07 DIAGNOSIS — Z90710 Acquired absence of both cervix and uterus: Secondary | ICD-10-CM | POA: Diagnosis not present

## 2016-11-07 DIAGNOSIS — Z923 Personal history of irradiation: Secondary | ICD-10-CM

## 2016-11-07 DIAGNOSIS — R569 Unspecified convulsions: Secondary | ICD-10-CM

## 2016-11-07 DIAGNOSIS — J181 Lobar pneumonia, unspecified organism: Secondary | ICD-10-CM

## 2016-11-07 DIAGNOSIS — J189 Pneumonia, unspecified organism: Secondary | ICD-10-CM | POA: Diagnosis present

## 2016-11-07 DIAGNOSIS — Z8249 Family history of ischemic heart disease and other diseases of the circulatory system: Secondary | ICD-10-CM

## 2016-11-07 DIAGNOSIS — R0902 Hypoxemia: Secondary | ICD-10-CM

## 2016-11-07 LAB — URINALYSIS, ROUTINE W REFLEX MICROSCOPIC
Bilirubin Urine: NEGATIVE
Glucose, UA: NEGATIVE mg/dL
Hgb urine dipstick: NEGATIVE
Ketones, ur: NEGATIVE mg/dL
LEUKOCYTES UA: NEGATIVE
NITRITE: NEGATIVE
PH: 7 (ref 5.0–8.0)
Protein, ur: NEGATIVE mg/dL
Specific Gravity, Urine: 1.018 (ref 1.005–1.030)

## 2016-11-07 LAB — I-STAT CG4 LACTIC ACID, ED: LACTIC ACID, VENOUS: 0.92 mmol/L (ref 0.5–1.9)

## 2016-11-07 LAB — COMPREHENSIVE METABOLIC PANEL
ALBUMIN: 3.7 g/dL (ref 3.5–5.0)
ALT: 20 U/L (ref 14–54)
ANION GAP: 7 (ref 5–15)
AST: 25 U/L (ref 15–41)
Alkaline Phosphatase: 89 U/L (ref 38–126)
BUN: 10 mg/dL (ref 6–20)
CALCIUM: 9 mg/dL (ref 8.9–10.3)
CO2: 22 mmol/L (ref 22–32)
Chloride: 105 mmol/L (ref 101–111)
Creatinine, Ser: 0.99 mg/dL (ref 0.44–1.00)
GLUCOSE: 122 mg/dL — AB (ref 65–99)
POTASSIUM: 3.8 mmol/L (ref 3.5–5.1)
Sodium: 134 mmol/L — ABNORMAL LOW (ref 135–145)
TOTAL PROTEIN: 7.3 g/dL (ref 6.5–8.1)
Total Bilirubin: 0.5 mg/dL (ref 0.3–1.2)

## 2016-11-07 LAB — I-STAT BETA HCG BLOOD, ED (MC, WL, AP ONLY): HCG, QUANTITATIVE: 6.8 m[IU]/mL — AB (ref <5)

## 2016-11-07 LAB — RAPID STREP SCREEN (MED CTR MEBANE ONLY): Streptococcus, Group A Screen (Direct): NEGATIVE

## 2016-11-07 LAB — CBC
HEMATOCRIT: 41.1 % (ref 36.0–46.0)
HEMOGLOBIN: 13.9 g/dL (ref 12.0–15.0)
MCH: 28.1 pg (ref 26.0–34.0)
MCHC: 33.8 g/dL (ref 30.0–36.0)
MCV: 83.2 fL (ref 78.0–100.0)
Platelets: 268 10*3/uL (ref 150–400)
RBC: 4.94 MIL/uL (ref 3.87–5.11)
RDW: 14.9 % (ref 11.5–15.5)
WBC: 14.9 10*3/uL — AB (ref 4.0–10.5)

## 2016-11-07 LAB — TROPONIN I: Troponin I: <0.03 ng/mL (ref <0.03)

## 2016-11-07 LAB — LIPASE, BLOOD: Lipase: 17 U/L (ref 11–51)

## 2016-11-07 MED ORDER — ONDANSETRON HCL 4 MG/2ML IJ SOLN
4.0000 mg | Freq: Once | INTRAMUSCULAR | Status: AC
  Administered 2016-11-07: 4 mg via INTRAVENOUS
  Filled 2016-11-07: qty 2

## 2016-11-07 MED ORDER — ACETAMINOPHEN 325 MG PO TABS
650.0000 mg | ORAL_TABLET | Freq: Four times a day (QID) | ORAL | Status: DC | PRN
  Administered 2016-11-08 – 2016-11-09 (×3): 650 mg via ORAL
  Filled 2016-11-07 (×3): qty 2

## 2016-11-07 MED ORDER — SODIUM CHLORIDE 0.9 % IV BOLUS (SEPSIS)
1000.0000 mL | Freq: Once | INTRAVENOUS | Status: AC
  Administered 2016-11-07: 1000 mL via INTRAVENOUS

## 2016-11-07 MED ORDER — ONDANSETRON HCL 4 MG PO TABS
4.0000 mg | ORAL_TABLET | Freq: Four times a day (QID) | ORAL | Status: DC | PRN
  Administered 2016-11-08: 4 mg via ORAL
  Filled 2016-11-07: qty 1

## 2016-11-07 MED ORDER — IPRATROPIUM-ALBUTEROL 0.5-2.5 (3) MG/3ML IN SOLN: 3.0000 mL | Freq: Four times a day (QID) | RESPIRATORY_TRACT | Status: DC

## 2016-11-07 MED ORDER — ONDANSETRON HCL 4 MG/2ML IJ SOLN
4.0000 mg | Freq: Four times a day (QID) | INTRAMUSCULAR | Status: DC | PRN
  Administered 2016-11-08: 4 mg via INTRAVENOUS
  Filled 2016-11-07: qty 2

## 2016-11-07 MED ORDER — MORPHINE SULFATE (PF) 4 MG/ML IV SOLN
2.0000 mg | INTRAVENOUS | Status: DC | PRN
  Administered 2016-11-08: 2 mg via INTRAVENOUS
  Filled 2016-11-07 (×2): qty 1

## 2016-11-07 MED ORDER — DEXTROSE 5 % IV SOLN
1.0000 g | Freq: Once | INTRAVENOUS | Status: AC
  Administered 2016-11-07: 1 g via INTRAVENOUS
  Filled 2016-11-07: qty 10

## 2016-11-07 MED ORDER — IPRATROPIUM-ALBUTEROL 0.5-2.5 (3) MG/3ML IN SOLN: 3.0000 mL | Freq: Four times a day (QID) | RESPIRATORY_TRACT | Status: DC | PRN

## 2016-11-07 MED ORDER — METOCLOPRAMIDE HCL 5 MG/ML IJ SOLN: 10.0000 mg | Freq: Once | INTRAMUSCULAR | Status: DC

## 2016-11-07 MED ORDER — MORPHINE SULFATE (PF) 4 MG/ML IV SOLN
4.0000 mg | Freq: Once | INTRAVENOUS | Status: AC
  Administered 2016-11-07: 4 mg via INTRAVENOUS
  Filled 2016-11-07: qty 1

## 2016-11-07 MED ORDER — SODIUM CHLORIDE 0.9 % IV SOLN
INTRAVENOUS | Status: AC
  Administered 2016-11-07 (×2): via INTRAVENOUS

## 2016-11-07 MED ORDER — IBUPROFEN 400 MG PO TABS
600.0000 mg | ORAL_TABLET | Freq: Once | ORAL | Status: AC
  Administered 2016-11-07: 14:00:00 600 mg via ORAL
  Filled 2016-11-07: qty 1

## 2016-11-07 MED ORDER — IBUPROFEN 400 MG PO TABS: 600.0000 mg | ORAL_TABLET | Freq: Once | ORAL | Status: DC

## 2016-11-07 MED ORDER — ASPIRIN EC 81 MG PO TBEC
81.0000 mg | DELAYED_RELEASE_TABLET | Freq: Every day | ORAL | Status: DC
  Administered 2016-11-07 – 2016-11-09 (×3): 81 mg via ORAL
  Filled 2016-11-07 (×3): qty 1

## 2016-11-07 MED ORDER — ENOXAPARIN SODIUM 40 MG/0.4ML ~~LOC~~ SOLN
40.0000 mg | SUBCUTANEOUS | Status: DC
  Administered 2016-11-07 – 2016-11-08 (×2): 40 mg via SUBCUTANEOUS
  Filled 2016-11-07 (×2): qty 0.4

## 2016-11-07 MED ORDER — CEFTRIAXONE SODIUM 1 G IJ SOLR
1.0000 g | INTRAMUSCULAR | Status: DC
  Administered 2016-11-08: 1 g via INTRAVENOUS
  Filled 2016-11-07 (×2): qty 10

## 2016-11-07 MED ORDER — DEXTROSE 5 % IV SOLN
500.0000 mg | Freq: Once | INTRAVENOUS | Status: AC
  Administered 2016-11-07: 500 mg via INTRAVENOUS
  Filled 2016-11-07: qty 500

## 2016-11-07 MED ORDER — ACETAMINOPHEN 650 MG RE SUPP: 650.0000 mg | Freq: Four times a day (QID) | RECTAL | Status: DC | PRN

## 2016-11-07 MED ORDER — AZITHROMYCIN 500 MG IV SOLR
250.0000 mg | INTRAVENOUS | Status: DC
  Administered 2016-11-08: 250 mg via INTRAVENOUS
  Filled 2016-11-07 (×2): qty 250

## 2016-11-07 NOTE — ED Notes (Signed)
ED Provider at bedside. 

## 2016-11-07 NOTE — H&P (Signed)
Date: 11/07/2016               Patient Name:  [name] MRN: [id number]  DOB: Sep 04, 1975 Age / Sex: 41 y.o., female   PCP: Patient, No Pcp Per         Medical Service: Internal Medicine Teaching Service         Attending Physician: Dr. Aldine Contes, MD    First Contact: Dr. Einar Gip, DO Pager: [phone]  Second Contact: Dr. Ignacia Marvel, MD Pager: [phone]       After Hours (After 5p/  First Contact Pager: [phone]  weekends / holidays): Second Contact Pager: [phone]   Chief Complaint: fever, seizure, right flank pain  History of Present Illness:  41 y/o F with Mhx significant for seizure disorder not on AED and hx of L breast cancer s/p lumpectomy + radiation 06/2014 who presents for evaluation of fevers, seizure and right flank pain. Reports she had been experiencing fatigue for the past several days however was otherwise in her usual state of health until she woke-up at 1 am this morning with nausea and NBNB vomiting x1. She went back to bed and developed persistent right flank pain with radiation around her side at about 6 am. She also endorsed subjective fevers and chills with onset around this time. At around 8 am this morning she endorsed an unwitnessed "seizure." She does not recall what happened but remembers feeling "outside of her body" for several minutes after. This is reportedly similar to her prior seizures. Endorsed mild neck stiffness and photophobia as well as central chest heaviness with onset shortly after emesis. She also endorses frontal headache which she usually experiences during seizures. She denies any dysuria, hematuria, SOB, cough, abdominal pain, diarrhea, constipation or sick contacts although she does work in Therapist, art.   On arrival to the ED she was febrile to 103*, tachycardic to 114, hypertensive to 143/82 and she was saturating well on RA. CMET wnl and CBC with leukocytosis to 14.9. Lactic acid 0.9. CXR concerning for LLL PNA. RUQ [place]  negative. Rapid strep was obtained due to sore throat which was negative. She was subsequently given IV Azithromycin and Ceftriaxone, IMTS called to admit.   Meds:  Current Meds  Medication Sig  . acetaminophen (TYLENOL) 500 MG tablet Take 1,000 mg by mouth every 6 (six) hours as needed for mild pain.  . cetirizine (ZYRTEC ALLERGY) 10 MG tablet Take 1 tablet (10 mg total) by mouth daily.  . naproxen sodium (ANAPROX) 220 MG tablet Take 220 mg by mouth 2 (two) times daily as needed (pain).   Allergies: Allergies as of 11/07/2016  . (No Known Allergies)   Past Medical History:  Diagnosis Date  . Asthma    no current med.  . Breast cancer of upper-outer quadrant of left female breast ([place]) 07/16/2014  . Heart murmur    as a child  . History of seizures    during pregnancies; last seizure 3 yrs. ago  . Seizures (HCC)    no medications  . Stress headaches    Family History: Mother alive with history of HTN. Father deceased.   Social History: She has never smoked and denies any recreational drug use. Occasional alcohol use. She lives at home with her boyfriend and 3 grown sons. Also has 1 granddaughter. Works in Therapist, art.   Review of Systems: A complete ROS was negative except as per HPI.   Physical Exam: Blood pressure (!) 90/55,  pulse 85, temperature 98.4 F (36.9 C), temperature source Oral, resp. rate 17, height 5\' 3"  (1.6 m), weight 240 lb (108.9 kg), SpO2 95 %. General: Obese caucasian female who appears older than stated age. Laying in bed without distress. Appears fatigued.   HENT: PERRL. EOMI. No conjunctival injection, icterus or ptosis. Poor dentition with moist mucous membranes. No cervical or supraclavicular lymphadenopathy appreciated.  Cardiovascular: Regular rate and rhythm. No murmur or rub appreciated. Pulmonary: Faint crackles left lower lung. Good air movement throughout. Normal WOB. Able to lay flat and speak in complete sentences without SOB. Abdomen:  Soft and non-distended. +bowel sounds. +L CVA tenderness. No rashes on back.   Extremities: Trace non-pitting peripheral edema BL LE. Intact distal pulses. No gross deformities. Skin: Warm, dry. No cyanosis.  Neuro: Strength and sensation grossly intact. No focal deficits.  Psych: Mood normal and affect was mood congruent. Responds to questions appropriately.   EKG: EKG with sinus tachycardia with rate of 106. No TWI, ST segment changes or QT prolongation.   CXR independently reviewed by myself: New left lower lobe consolidation when compared to prior SXR 09/22/16.  Assessment & Plan by Problem: Principal Problem:   Seizure ([place]) Active Problems:   Left lower lobe pneumonia (HCC)   Right flank pain  Seizure disorder: Self-discontinued Dilantin ~1 year ago per patient estimate due to medication side-effects. Chart review shows several ED presentations over the past several years for seizures, all occurring while under acute stress or illness. Believe patients seizure threshold decreased due to patients current infectious illness, fever and lack of maintenance medications. She does not follow with a PCP or neurology in the outpatient setting.  -Seizure precautions -Treat antagonizing conditions as below -Will need follow-up with neurology at discharge and will also need PCP as well -Zofran PRN  Left Lower Lobe Pneumonia: Appreciated on CXR and faint crackles appreciated on examination. Saturating well on RA. She is febrile with leukocytosis however without evidence of end-organ dysfunction. Does not meet sepsis criteria. ?Aspiration in this patient with recent seizure? -Blood cultures -Ceftriaxone and Azithromycin -Duonebs Q6H prn  Right Flank Pain: Sudden onset around 1 am this morning, associated with N/V. +CVA tenderness. UA is negative however I am concerned for kidney stone vs pyelonephritis. She received morphine 4 mg in ED with modest improvement of her pain.  -CT renal  stone  History of L Breast Cancer (Stage 1A T1c, N0, M0), s/p resection + radiation: Sees oncology for this. Had recent biopsy which was negative.   Diet: NPO except ice chips and sips with meds Code status: FULL - this was confirmed with patient upon admission.  IVF: NS @ 180mls/hr x 20 hrs. S/p 1L bolus in ED DVT ppx: Lovenox  Dispo: Admit patient to Inpatient with expected length of stay greater than 2 midnights.  SignedEinar Gip, DO 11/07/2016, 3:20 PM  Pager: [phone]

## 2016-11-07 NOTE — ED Notes (Signed)
2nd set of blood cultures done after starting antibiotics

## 2016-11-07 NOTE — ED Notes (Signed)
Attempted report x1. 

## 2016-11-07 NOTE — ED Provider Notes (Signed)
[facility] DEPT Provider Note   CSN: [id number] Arrival date & time: 11/07/16  6720     History   Chief Complaint Chief Complaint  Patient presents with  . Emesis  . Seizures  . Sore Throat    HPI [name] is a 41 y.o. female.  The history is provided by the patient.  Illness  This is a new problem. Episode onset: started overnight. The problem occurs constantly. The problem has not changed since onset.Associated symptoms include abdominal pain (right flank, n/v, and fever) and headaches. Pertinent negatives include no chest pain and no shortness of breath. Nothing aggravates the symptoms. Nothing relieves the symptoms. She has tried nothing for the symptoms. The treatment provided no relief.    Past Medical History:  Diagnosis Date  . Asthma    no current med.  . Breast cancer of upper-outer quadrant of left female breast ([place]) 07/16/2014  . Heart murmur    as a child  . History of seizures    during pregnancies; last seizure 3 yrs. ago  . Seizures (HCC)    no medications  . Stress headaches     Patient Active Problem List   Diagnosis Date Noted  . Anxiety 07/05/2015  . Depression 03/23/2015  . Genetic testing 08/18/2014  . Family history of breast cancer   . Breast cancer of upper-outer quadrant of left female breast ([place]) 07/16/2014  . 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 12/20/2009  . SEIZURE DISORDER 07/07/2009  . DEPRESSION, RECURRENT 08/24/2008  . HYPERSOMNIA, IDIOPATHIC 08/24/2008  . 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 ABNORMAL RESULT, FUNCTION STUDY NEC 03/21/2007    Past Surgical History:  Procedure Laterality Date  . ABDOMINAL HYSTERECTOMY    . BREAST SURGERY     lumpectomy and lymph nodes  . CESAREAN SECTION  [id number]  . CESAREAN SECTION WITH BILATERAL TUBAL LIGATION  03/03/2003  . CYSTO  12/20/2009  . HYSTEROSCOPY W/ ENDOMETRIAL ABLATION  11/11/2007  . LYSIS OF ADHESION  12/20/2009  . TOTAL ABDOMINAL  HYSTERECTOMY W/ BILATERAL SALPINGOOPHORECTOMY  12/20/2009  . TYMPANOSTOMY TUBE PLACEMENT      OB History    No data available       Home Medications    Prior to Admission medications   Medication Sig Start Date End Date Taking? Authorizing Provider  acetaminophen (TYLENOL) 500 MG tablet Take 1,000 mg by mouth every 6 (six) hours as needed for mild pain.   Yes [provider]  cetirizine (ZYRTEC ALLERGY) 10 MG tablet Take 1 tablet (10 mg total) by mouth daily. 09/22/16  Yes Waynetta Pean, PA-C  naproxen sodium (ANAPROX) 220 MG tablet Take 220 mg by mouth 2 (two) times daily as needed (pain).   Yes [provider]  benzonatate (TESSALON) 100 MG capsule Take 1 capsule (100 mg total) by mouth 3 (three) times daily as needed for cough. Patient not taking: Reported on 11/07/2016 09/22/16   Waynetta Pean, PA-C  doxycycline (VIBRAMYCIN) 100 MG capsule Take 1 capsule (100 mg total) by mouth 2 (two) times daily. Patient not taking: Reported on 11/07/2016 08/23/16   Fredia Sorrow, MD  exemestane (AROMASIN) 25 MG tablet Take 1 tablet (25 mg total) by mouth daily after breakfast. Patient not taking: Reported on 08/06/2016 05/17/16   Nicholas Lose, MD  fluticasone (FLONASE) 50 MCG/ACT nasal spray Place 2 sprays into both nostrils daily. Patient not taking: Reported on 11/07/2016 09/22/16   Waynetta Pean, PA-C  naproxen (NAPROSYN) 500 MG tablet Take 1 tablet (  500 mg total) by mouth 2 (two) times daily. Patient not taking: Reported on 11/07/2016 08/23/16   Fredia Sorrow, MD  tamoxifen (NOLVADEX) 20 MG tablet Take 1 tablet (20 mg total) by mouth daily. Patient not taking: Reported on 11/07/2016 08/08/16   Nicholas Lose, MD  valACYclovir (VALTREX) 1000 MG tablet Take 1 tablet (1,000 mg total) by mouth 2 (two) times daily. Patient not taking: Reported on 08/23/2016 08/08/16   Nicholas Lose, MD    Family History Family History  Problem Relation Age of Onset  . Hypertension Mother   .  Breast cancer Other        MGM's sister  . Breast cancer Maternal Grandmother        dx in her 40s    Social History Social History  Substance Use Topics  . Smoking status: Never Smoker  . Smokeless tobacco: Never Used  . Alcohol use Yes     Comment: occ     Allergies   Patient has no known allergies.   Review of Systems Review of Systems  Constitutional: Positive for fever.  HENT: Positive for sore throat. Negative for trouble swallowing.   Respiratory: Negative for shortness of breath.   Cardiovascular: Negative for chest pain.  Gastrointestinal: Positive for abdominal pain (right flank, n/v, and fever), nausea and vomiting.  Genitourinary: Negative.   Neurological: Positive for seizures (had an unwitnessed seizure at 6 AM) and headaches.  All other systems reviewed and are negative.    Physical Exam Updated Vital Signs BP (!) 143/82 (BP Location: Right Arm)   Pulse (!) 114   Temp (!) 103 F (39.4 C) (Oral)   Resp 18   Ht 5\' 3"  (1.6 m)   Wt 108.9 kg   SpO2 95%   BMI 42.51 kg/m   Physical Exam  Constitutional: She is oriented to person, place, and time. She appears well-developed and well-nourished. No distress.  HENT:  Head: Normocephalic and atraumatic.  Mouth/Throat: Oropharynx is clear and moist.  Eyes: Conjunctivae and EOM are normal. Pupils are equal, round, and reactive to light.  Neck: Normal range of motion. Neck supple.  Swollen red tonsils with some exudates  Cardiovascular: Regular rhythm and normal heart sounds.   No murmur heard. tachycardic  Pulmonary/Chest: Effort normal and breath sounds normal. No respiratory distress.  Abdominal: Soft. There is no tenderness. There is no rebound and no guarding.  Right flank pain  Musculoskeletal: She exhibits no edema or tenderness.  Neurological: She is alert and oriented to person, place, and time. No cranial nerve deficit. She exhibits normal muscle tone. Coordination normal.  Skin: Skin is warm  and dry.  Psychiatric: She has a normal mood and affect.  Nursing note and vitals reviewed.    ED Treatments / Results  Labs (all labs ordered are listed, but only abnormal results are displayed) Labs Reviewed  COMPREHENSIVE METABOLIC PANEL - Abnormal; Notable for the following:       Result Value   Sodium 134 (*)    Glucose, Bld 122 (*)    All other components within normal limits  CBC - Abnormal; Notable for the following:    WBC 14.9 (*)    All other components within normal limits  I-STAT BETA HCG BLOOD, ED ([facility], [facility], [facility] ONLY) - Abnormal; Notable for the following:    I-stat hCG, quantitative 6.8 (*)    All other components within normal limits  RAPID STREP SCREEN (NOT AT [facility])  CULTURE, GROUP A STREP ([place])  LIPASE,  BLOOD  URINALYSIS, ROUTINE W REFLEX MICROSCOPIC  I-STAT CG4 LACTIC ACID, ED  I-STAT CG4 LACTIC ACID, ED    EKG  EKG Interpretation None       Radiology Dg Chest 2 View  Result Date: 11/07/2016 CLINICAL DATA:  Onset of vomiting and fever this morning. EXAM: CHEST  2 VIEW COMPARISON:  Chest x-ray of September 22, 2016 FINDINGS: The lungs are adequately inflated. There is new increased density in the left retrocardiac region. The heart is top-normal in size but stable. The pulmonary vascularity is not engorged. The mediastinum is normal in width. There is calcification in the wall of the aortic arch. There is no pleural effusion. The bony thorax exhibits no acute abnormality. IMPRESSION: Left lower lobe pneumonia. The findings are accentuated by hypoinflation. Followup PA and lateral chest X-ray is recommended in 3-4 weeks following trial of antibiotic therapy to ensure resolution and exclude underlying malignancy. Thoracic aortic atherosclerosis. Electronically Signed   By: David  Martinique M.D.   On: 11/07/2016 11:48   US Abdomen Limited  Result Date: 11/07/2016 CLINICAL DATA:  Upper abdominal pain EXAM: US ABDOMEN LIMITED - RIGHT UPPER QUADRANT COMPARISON:   Abdominal CT 03/26/2013 FINDINGS: Gallbladder: No gallstones or wall thickening visualized. No sonographic Murphy sign noted by sonographer. Common bile duct: Diameter: 4 mm. Limited visualization. Where visualized, no filling defect. Liver: No focal lesion identified. Within normal limits in parenchymal echogenicity. Antegrade flow in the main portal vein IMPRESSION: Negative right upper quadrant ultrasound. Electronically Signed   By: Monte Fantasia M.D.   On: 11/07/2016 12:51    Procedures Procedures (including critical care time)  Medications Ordered in ED Medications  azithromycin (ZITHROMAX) 500 mg in dextrose 5 % 250 mL IVPB (500 mg Intravenous New Bag/Given 11/07/16 1519)  metoCLOPramide (REGLAN) injection 10 mg (not administered)  sodium chloride 0.9 % bolus 1,000 mL (0 mLs Intravenous Stopped 11/07/16 1230)  morphine 4 MG/ML injection 4 mg (4 mg Intravenous Given 11/07/16 1224)  ondansetron (ZOFRAN) injection 4 mg (4 mg Intravenous Given 11/07/16 1224)  cefTRIAXone (ROCEPHIN) 1 g in dextrose 5 % 50 mL IVPB (0 g Intravenous Stopped 11/07/16 1516)  ibuprofen (ADVIL,MOTRIN) tablet 600 mg (600 mg Oral Given 11/07/16 1349)     Initial Impression / Assessment and Plan / ED Course  I have reviewed the triage vital signs and the nursing notes.  Pertinent labs & imaging results that were available during my care of the patient were reviewed by me and considered in my medical decision making (see chart for details).     Patient is a 41 year old female with a history of seizures who presents with nausea, vomiting, right flank pain, fever, sore throat, headache that began overnight and reportedly had a seizure today. She does not take seizure medications and last seizure was one year ago. Further history and exam as above with fever and tachycardia here otherwise on exam has full neck range of motion, no neuro deficits, and she does have right flank pain. Given vital sign abnormalities concerned  for sepsis. Blood work and cxr obtained with possible LLL pna. Given right flank pain ruq US obtained with acute biliary pathology. UA appears clean without blood or signs of uti.  Chemistry reassuring. During her stay she did develop 2L o2 req after morphine which persisted after some time. Unclear if 2/2 morphine (low dose) vs pna. Also with persistent n/v as well as tachycardia, tachypnea, and fever. C/f sepsis 2/2 pna. Pt will be admitted to medicine for further  management and evaluation. Given ceftriaxone and azithro.    Final Clinical Impressions(s) / ED Diagnoses   Final diagnoses:  None    New Prescriptions New Prescriptions   No medications on file     Heriberto Antigua, MD 11/08/16 0177    Julianne Rice, MD 11/15/16 [phone]

## 2016-11-07 NOTE — ED Triage Notes (Signed)
Pt sts vomiting and fever this am; pt sts seizure this am and hit head; pt noted to have fever but unable to tolerate PO due to vomiting

## 2016-11-08 DIAGNOSIS — Z90710 Acquired absence of both cervix and uterus: Secondary | ICD-10-CM

## 2016-11-08 DIAGNOSIS — G40909 Epilepsy, unspecified, not intractable, without status epilepticus: Principal | ICD-10-CM

## 2016-11-08 DIAGNOSIS — R112 Nausea with vomiting, unspecified: Secondary | ICD-10-CM

## 2016-11-08 DIAGNOSIS — R7989 Other specified abnormal findings of blood chemistry: Secondary | ICD-10-CM

## 2016-11-08 DIAGNOSIS — J189 Pneumonia, unspecified organism: Secondary | ICD-10-CM

## 2016-11-08 DIAGNOSIS — Z90722 Acquired absence of ovaries, bilateral: Secondary | ICD-10-CM

## 2016-11-08 DIAGNOSIS — J029 Acute pharyngitis, unspecified: Secondary | ICD-10-CM

## 2016-11-08 DIAGNOSIS — R109 Unspecified abdominal pain: Secondary | ICD-10-CM

## 2016-11-08 LAB — COMPREHENSIVE METABOLIC PANEL
ALBUMIN: 2.6 g/dL — AB (ref 3.5–5.0)
ALK PHOS: 63 U/L (ref 38–126)
ALT: 13 U/L — ABNORMAL LOW (ref 14–54)
AST: 14 U/L — AB (ref 15–41)
Anion gap: 7 (ref 5–15)
BILIRUBIN TOTAL: 0.4 mg/dL (ref 0.3–1.2)
BUN: 8 mg/dL (ref 6–20)
CALCIUM: 7.5 mg/dL — AB (ref 8.9–10.3)
CO2: 21 mmol/L — AB (ref 22–32)
Chloride: 111 mmol/L (ref 101–111)
Creatinine, Ser: 0.86 mg/dL (ref 0.44–1.00)
GFR calc Af Amer: >60 mL/min (ref >60)
GFR calc non Af Amer: >60 mL/min (ref >60)
GLUCOSE: 105 mg/dL — AB (ref 65–99)
POTASSIUM: 3.6 mmol/L (ref 3.5–5.1)
Sodium: 139 mmol/L (ref 135–145)
TOTAL PROTEIN: 5.2 g/dL — AB (ref 6.5–8.1)

## 2016-11-08 LAB — CBC
HEMATOCRIT: 35.8 % — AB (ref 36.0–46.0)
HEMOGLOBIN: 11.2 g/dL — AB (ref 12.0–15.0)
MCH: 26.3 pg (ref 26.0–34.0)
MCHC: 31.3 g/dL (ref 30.0–36.0)
MCV: 84 fL (ref 78.0–100.0)
Platelets: 202 10*3/uL (ref 150–400)
RBC: 4.26 MIL/uL (ref 3.87–5.11)
RDW: 15.3 % (ref 11.5–15.5)
WBC: 9.8 10*3/uL (ref 4.0–10.5)

## 2016-11-08 LAB — RESPIRATORY PANEL BY PCR
Adenovirus: NOT DETECTED
Bordetella pertussis: NOT DETECTED
CHLAMYDOPHILA PNEUMONIAE-RVPPCR: NOT DETECTED
CORONAVIRUS 229E-RVPPCR: NOT DETECTED
CORONAVIRUS OC43-RVPPCR: NOT DETECTED
Coronavirus HKU1: NOT DETECTED
Coronavirus NL63: NOT DETECTED
INFLUENZA A-RVPPCR: NOT DETECTED
INFLUENZA B-RVPPCR: NOT DETECTED
MYCOPLASMA PNEUMONIAE-RVPPCR: NOT DETECTED
Metapneumovirus: NOT DETECTED
PARAINFLUENZA VIRUS 4-RVPPCR: NOT DETECTED
Parainfluenza Virus 1: NOT DETECTED
Parainfluenza Virus 2: NOT DETECTED
Parainfluenza Virus 3: NOT DETECTED
RESPIRATORY SYNCYTIAL VIRUS-RVPPCR: NOT DETECTED
Rhinovirus / Enterovirus: NOT DETECTED

## 2016-11-08 LAB — HIV ANTIBODY (ROUTINE TESTING W REFLEX): HIV SCREEN 4TH GENERATION: NONREACTIVE

## 2016-11-08 MED ORDER — LEVETIRACETAM ER 500 MG PO TB24
500.0000 mg | ORAL_TABLET | Freq: Every day | ORAL | Status: DC
  Administered 2016-11-08 – 2016-11-09 (×2): 500 mg via ORAL
  Filled 2016-11-08 (×3): qty 1

## 2016-11-08 MED ORDER — MORPHINE SULFATE (PF) 4 MG/ML IV SOLN
2.0000 mg | INTRAVENOUS | Status: DC | PRN
  Administered 2016-11-09: 2 mg via INTRAVENOUS
  Filled 2016-11-08: qty 1

## 2016-11-08 MED ORDER — IBUPROFEN 200 MG PO TABS: 800.0000 mg | ORAL_TABLET | Freq: Once | ORAL | Status: DC

## 2016-11-08 MED ORDER — GUAIFENESIN ER 600 MG PO TB12
600.0000 mg | ORAL_TABLET | Freq: Two times a day (BID) | ORAL | Status: DC
  Administered 2016-11-08 – 2016-11-09 (×3): 600 mg via ORAL
  Filled 2016-11-08 (×3): qty 1

## 2016-11-08 MED ORDER — IBUPROFEN 200 MG PO TABS
800.0000 mg | ORAL_TABLET | Freq: Four times a day (QID) | ORAL | Status: DC | PRN
  Administered 2016-11-08 (×2): 800 mg via ORAL
  Filled 2016-11-08 (×2): qty 4

## 2016-11-08 MED ORDER — PHENOL 1.4 % MT LIQD: 1.0000 | OROMUCOSAL | Status: DC | PRN

## 2016-11-08 NOTE — Progress Notes (Signed)
Patient bloody emesis x1.  Temp 100.7, pt with complaints of h/a. Zofran and tylenol given. No new orders at this time. Continue to monitor.

## 2016-11-08 NOTE — Care Management Note (Signed)
Case Management Note  Patient Details  Name: [name] MRN: [id number] Date of Birth: 02/06/1976  Subjective/Objective:   Pt admitted with seizure. She is from home with her boyfriend and sons. Pt without PCP and insurance listed.                  Action/Plan: Plan is for patient to return home when medically ready. CM will f/u with patient about hospital f/u and medication assistance. CM following.   Expected Discharge Date:                  Expected Discharge Plan:  Home/Self Care  In-House Referral:     Discharge planning Services     Post Acute Care Choice:    Choice offered to:     DME Arranged:    DME Agency:     HH Arranged:    HH Agency:     Status of Service:  In process, will continue to follow  If discussed at Long Length of Stay Meetings, dates discussed:    Additional Comments:  Pollie Friar, RN 11/08/2016, 11:11 AM

## 2016-11-08 NOTE — Progress Notes (Signed)
   Subjective: Seen and evaluated today at bedside. Requests diet. No more vomiting since overnight when she felt gagged by her collar of her gown. Denies any nausea since admission. Continues to complain or sore throat and back pain. Coughing up phlegm. Feels back pain is due to sleeping on that side.  Objective:  Vital signs in last 24 hours: Vitals:   11/07/16 2056 11/08/16 0102 11/08/16 0529 11/08/16 1042  BP: 104/61 (!) 137/59 114/72 (!) 96/46  Pulse: 78 94 (!) 102 88  Resp: 20 18 20 20   Temp: 98.5 F (36.9 C) (!) 100.7 F (38.2 C) 98.4 F (36.9 C) 98.5 F (36.9 C)  TempSrc: Oral Oral Oral Oral  SpO2: 97% 97% 97% 98%  Weight:      Height:       General: In no acute distress. Resting comfortably in bed. Looks fatigued. HENT: No conjunctival injections. No ptosis. Posterior pharynx with erythema however no exudates appreciated.  Cardiovascular: Regular rate and rhythm. No murmur or rub appreciated. Pulmonary: CTA BL, no wheezing, crackles or rhonchi appreciated. Unlabored breathing.  Abdomen: Soft. Minimally tender to palpation throughout. MINIMAL right CVA tenderness.+bs Skin: Warm, dry. No cyanosis.  Psych: Flat affect. Responds to questions appropriately however appears to be poor historian.   Assessment/Plan:  Principal Problem:   Seizure ([place]) Active Problems:   Left lower lobe pneumonia (HCC)   Right flank pain  Left Lower Lobe Pneumonia, CAP: Febrile overnight to 100.7* however has remained afebrile throughout the morning. Leukocytosis has resolved. Doing well on IV Ceftriaxone and Azithromycin. Complains of sore throat and phlegm however denies cough. Lungs clear on todays examination with good air movement. Saturating well on RA.  -Ceftriaxone + Azithromycin. Will transition to PO tomorrow if patient able to tolerate todays diet -Mucinex -Duoneb PRN -Follow WBC and fever trends -Bcx pending  Sore throat, Nausea/vomiting: She has viral-like syndrome with  malaise, sore throat, nausea, vomiting and fever. Symptoms have improved since admission. Rapid strep negative and exam not consistent with strep.  Will check respiratory viral panel.  -Resp viral panel -Chloraseptic spray PRN -Tylenol and Ibuprofen for fever/body aches -Zofran PRN however denies any nausea.  -RN note reports 1 episode of emesis last night with blood, confirmed this was a small streak.    Seizure Disorder: Threshold likely lowered by acute illness. Chart review shows consistent presentations in the past. She appears to have been prescribed dilantin 10/2015 however patient self-discontinued due to side effects. -Keppra XR 500mg  and she will require follow-up in outpatient setting -Continue seizure precautions  Right Flank Pain: Exam less impressive today. Minimal TTP. UA negative. CT renal stone pending however my overview is without perinephric stranding, stone or hydronephrosis.  -FU official CT read  Dispo: Anticipated discharge tomorrow if tolerating PO.   Roshan Salamon, DO 11/08/2016, 1:16 PM Pager: [phone]

## 2016-11-09 ENCOUNTER — Encounter: Payer: Self-pay | Admitting: Neurology

## 2016-11-09 LAB — CULTURE, GROUP A STREP (THRC)

## 2016-11-09 MED ORDER — ONDANSETRON HCL 4 MG PO TABS: 4.0000 mg | ORAL_TABLET | Freq: Four times a day (QID) | ORAL | 0 refills | Status: DC | PRN

## 2016-11-09 MED ORDER — IBUPROFEN 800 MG PO TABS: 800.0000 mg | ORAL_TABLET | Freq: Four times a day (QID) | ORAL | 0 refills | Status: DC | PRN

## 2016-11-09 MED ORDER — AZITHROMYCIN 500 MG PO TABS
500.0000 mg | ORAL_TABLET | Freq: Every day | ORAL | Status: DC
  Administered 2016-11-09: 500 mg via ORAL
  Filled 2016-11-09 (×2): qty 1

## 2016-11-09 MED ORDER — DEXTROSE 5 % IV SOLN
1.0000 g | INTRAVENOUS | Status: DC
  Administered 2016-11-09: 1 g via INTRAVENOUS
  Filled 2016-11-09: qty 10

## 2016-11-09 MED ORDER — CEFDINIR 300 MG PO CAPS: 300.0000 mg | ORAL_CAPSULE | Freq: Two times a day (BID) | ORAL | 0 refills | Status: AC

## 2016-11-09 MED ORDER — GUAIFENESIN ER 600 MG PO TB12: 600.0000 mg | ORAL_TABLET | Freq: Two times a day (BID) | ORAL | 0 refills | Status: DC

## 2016-11-09 MED ORDER — LEVETIRACETAM ER 500 MG PO TB24: 500.0000 mg | ORAL_TABLET | Freq: Every day | ORAL | 0 refills | Status: DC

## 2016-11-09 MED ORDER — KETOROLAC TROMETHAMINE 30 MG/ML IJ SOLN
45.0000 mg | Freq: Once | INTRAMUSCULAR | Status: AC
  Administered 2016-11-09: 45 mg via INTRAVENOUS
  Filled 2016-11-09: qty 2

## 2016-11-09 MED ORDER — ASPIRIN 81 MG PO TBEC: 81.0000 mg | DELAYED_RELEASE_TABLET | Freq: Every day | ORAL | 0 refills | Status: DC

## 2016-11-09 MED ORDER — AZITHROMYCIN 500 MG PO TABS: 500.0000 mg | ORAL_TABLET | Freq: Every day | ORAL | 0 refills | Status: AC

## 2016-11-09 NOTE — Discharge Instructions (Signed)
Ms. Iversen, you were admitted after having a febrile seizure. You have a history of a seizure disorder and you seem to have seizures when you are sick. That being said, it is important that you are on a medication that will help prevent future seizures. It is also important that you establish with a primary care physician and a neurologist as well shortly after discharge. We have scheduled you appointments for both shortly after discharge. Please do not miss these appointments. If you cannot make it, please call and reschedule.  You were started on an anti-seizure medicine called KEPPRA. Please take this medication once daily and do not miss a dose.  You are being treated for pneumonia. You have had 3 days of IV antibiotics while you were admitted and we are discharging you home with another 2 days of oral antibiotics. Please take CEFDINIR 300 mg twice daily for 2 days starting 11/10/16.

## 2016-11-09 NOTE — Progress Notes (Signed)
Pt discharged home. Work note given to pt. Discharge instructions were reviewed with the pt. Pt verbalized understanding.

## 2016-11-09 NOTE — Progress Notes (Signed)
   Subjective: Seen and evaluated today at bedside. Complains of headache and sore throat but otherwise well. Eating and drinking without issue. Feels ready for discharge.   Objective:  Vital signs in last 24 hours: Vitals:   11/08/16 2117 11/09/16 0053 11/09/16 0517 11/09/16 1002  BP: (!) 111/49 (!) 110/57 (!) 108/55 (!) 97/47  Pulse: 71 77 72 65  Resp: 18 18 20 18   Temp: 97.7 F (36.5 C) 97.9 F (36.6 C) 97.5 F (36.4 C) 98.1 F (36.7 C)  TempSrc: Oral Oral Oral Oral  SpO2: 98% 100% 100% 97%  Weight:      Height:       General: In no acute distress. Resting comfortably in bed. Looks fatigued. HENT:  Posterior pharynx again with erythema however no exudates appreciated.  Cardiovascular: Regular rate and rhythm. No murmur or rub appreciated. Pulmonary: Faint crackle LLL. Unlabored breathing.  Abdomen: Soft. NTND.+bs Skin: Warm, dry. No cyanosis.  Psych: Alert. Flat affect.  Assessment/Plan:  Principal Problem:   Seizure ([place]) Active Problems:   Left lower lobe pneumonia (HCC)   Right flank pain  Left Lower Lobe Pneumonia, CAP:  Afebrile for past 24 hours and feels better however complains of sore throat and headache. Tolerating antibiotics well. Respiratory viral panel negative.  -Continue Ceftriaxone + Azithromycin today -Will d/c with 2 more days of abx - Will do Cefdinir -Mucinex  Seizure Disorder: Threshold likely lowered by acute illness. This is consistent with her previous presentations. No seizures since admission. Started Keppra XR 500mg  yesterday after [facility] neuro. Counseled on importance of compliance.  -Outpatient neurology follow-up has been scheduled.   Right Flank Pain: No right flank pain today. UA negative and CT renal stone negative as well.   Dispo: Anticipated discharge today.   Catherin Doorn, DO 11/09/2016, 1:18 PM Pager: [phone]

## 2016-11-09 NOTE — Progress Notes (Signed)
Patient educated on reasoning for bed alarm, especially with seizure precautions; bed alarm left on each time staff leaves room, however staff returning to room to find bed alarm has been turned off.  Continue to attempt educating patient about safety and seizure precautions and get patient to adhere.

## 2016-11-09 NOTE — Care Management Note (Signed)
Case Management Note  Patient Details  Name: [name] MRN: [id number] Date of Birth: [birth year]  Subjective/Objective:                    Action/Plan: CM met with the patient and she was able to provide an insurance card. Card copied and sent to financial counseling.  CM inquired about a PCP. Pt does not have PCP and is interested in having one close to the hospital. CM was able to obtain her an appointment with [facility]. Appointment placed on the AVS.  Per patients insurance card she has [facility] for medication coverage.  Family to provide transportation home.   Expected Discharge Date:                  Expected Discharge Plan:  Home/Self Care  In-House Referral:     Discharge planning Services  CM Consult  Post Acute Care Choice:    Choice offered to:     DME Arranged:    DME Agency:     HH Arranged:    [place] Agency:     Status of Service:  Completed, signed off  If discussed at [employer] of Stay Meetings, dates discussed:    Additional Comments:  Pollie Friar, RN 11/09/2016, 10:55 AM

## 2016-11-09 NOTE — Discharge Summary (Signed)
Name: [name] MRN: [id number] DOB: 12/10/1975 41 y.o. PCP: Patient, No Pcp Per  Date of Admission: 11/07/2016 10:14 AM Date of Discharge: 11/09/2016 Attending Physician: Aldine Contes, MD  Discharge Diagnosis: 1. Seizure 2. Left lower lobe pneumonia 3. Right flank pain 4. Elevated beta hCG Principal Problem:   Seizure ([place]) Active Problems:   Left lower lobe pneumonia (HCC)   Right flank pain   Discharge Medications: Allergies as of 11/09/2016   No Known Allergies     Medication List    STOP taking these medications   benzonatate 100 MG capsule Commonly known as:  TESSALON   doxycycline 100 MG capsule Commonly known as:  VIBRAMYCIN   naproxen 500 MG tablet Commonly known as:  NAPROSYN   tamoxifen 20 MG tablet Commonly known as:  NOLVADEX   valACYclovir 1000 MG tablet Commonly known as:  VALTREX     TAKE these medications   acetaminophen 500 MG tablet Commonly known as:  TYLENOL Take 1,000 mg by mouth every 6 (six) hours as needed for mild pain.   aspirin 81 MG EC tablet Take 1 tablet (81 mg total) by mouth daily. Start taking on:  11/10/2016   cefdinir 300 MG capsule Commonly known as:  OMNICEF Take 1 capsule (300 mg total) by mouth 2 (two) times daily. Start taking on:  11/10/2016   cetirizine 10 MG tablet Commonly known as:  ZYRTEC ALLERGY Take 1 tablet (10 mg total) by mouth daily.   exemestane 25 MG tablet Commonly known as:  AROMASIN Take 1 tablet (25 mg total) by mouth daily after breakfast.   fluticasone 50 MCG/ACT nasal spray Commonly known as:  FLONASE Place 2 sprays into both nostrils daily.   guaiFENesin 600 MG 12 hr tablet Commonly known as:  MUCINEX Take 1 tablet (600 mg total) by mouth 2 (two) times daily.   ibuprofen 800 MG tablet Commonly known as:  ADVIL,MOTRIN Take 1 tablet (800 mg total) by mouth every 6 (six) hours as needed for fever, headache, mild pain or moderate pain (sore throat).   levETIRAcetam 500  MG 24 hr tablet Commonly known as:  KEPPRA XR Take 1 tablet (500 mg total) by mouth daily. Start taking on:  11/10/2016   naproxen sodium 220 MG tablet Commonly known as:  ANAPROX Take 220 mg by mouth 2 (two) times daily as needed (pain).   ondansetron 4 MG tablet Commonly known as:  ZOFRAN Take 1 tablet (4 mg total) by mouth every 6 (six) hours as needed for nausea.       Disposition and follow-up:   Ms.[name] was discharged from [facility] in Stable condition.  At the hospital follow up visit please address:  1.  Seizures: Insure compliance with Keppra 500 mg extended release. She has an appointment with neurology and please insure attendance. Left lower lobe pneumonia: She was discharged home with 2 days of cefdinir and azithromycin to complete a 5 day course. Please insure compliance. Elevated beta hCG: The patient status post hysterectomy. Please repeat level and evaluate if remains elevated  2.  Labs / imaging needed at time of follow-up: HCG  3.  Pending labs/ test needing follow-up: None  Follow-up Appointments: Follow-up Information    [facility]. Go on 11/24/2016.   Why:  Establish care with [facility] at your scheduled appointment 11/24/2016 @ 12:45 pm. Please call and reschedule if needed. DO NOT NO-SHOW.  Contact information: [place], [place]  [place] [place] [facility] Course by problem list: Principal Problem:   Seizure [facility]) Active Problems:   Left lower lobe pneumonia ([place])   Right flank pain   1. Seizure in setting of LLL This 41 year old female with history of seizure disorder presented 5/15 following a seizure. She woke up at 1 AM day of admission with nausea and vomiting and return to sleep. At 6 AM she awoke with what felt like fevers and chills and at approximately 8 AM she had an unwitnessed seizure. She does not recall the exact events however reported  feeling outside of her body afterwards for several minutes. This seemed to be consistent with her prior history of seizures. On admission she was febrile to 103 and was found have a left lower lobe pneumonia. This was treated with IV ceftriaxone and azithromycin with resolution of fever. She did not have any further seizure-like activity during admission. She was started on Keppra X are 5 mg once daily and referred to neurology for close outpatient follow-up. Chart review shows that she has had several seizures in the setting of acute illness or acute stressors in that she has self discontinued herself from Dilantin in the past. She was discharged home with Ceftin ear and azithromycin to complete a 5 day course of oral antibiotics she was also scheduled appointment to establish with a primary care physician in the family medicine clinic.  2. Right flank pain Patient reports onset of right flank pain the morning of admission. Denied any dysuria or increased frequency. UA was negative for infection. CT renal stone was without renal stone hydronephrosis or pyelonephritis. Patient later admitted in her history that she feels it was due to laying on her right side too much. Her right flank pain had improved during admission.  Discharge Vitals:   BP (!) 97/47 (BP Location: Right Arm)   Pulse 65   Temp 98.1 F (36.7 C) (Oral)   Resp 18   Ht 5\' 3"  (1.6 m)   Wt 240 lb (108.9 kg)   SpO2 97%   BMI 42.51 kg/m   Pertinent Labs, Studies, and Procedures:  Chest x-ray 11/07/16: Left lower lobe consolidation suspicious for pneumonia Critical upper quadrant ultrasound: Normal CT renal stone: Normal  Discharge Instructions: You were admitted after a seizure in the setting of acute illness. It appears you have history of seizures during similar events that you need to be on a long-term antiepileptic medication as well as follow-up with neurology after discharge. He was started on Keppra 500 mg once daily. You  were treated with IV antibiotics for your pneumonia in you're discharged home with a 2 day course of oral antibiotics as well. Follow-up appointments were scheduled to establish with a new primary care physician as well as a neurologist. Please attend these appointments. If you're unable to make them, please call and reschedule.  SignedEinar Gip, DO 11/09/2016, 2:30 PM   Pager: [phone]

## 2016-11-09 NOTE — Progress Notes (Signed)
Transitions of Care Pharmacy Note  Plan:  -Educated patient on new start Keppra, D/C acyclovir, and antibiotic regimen to continue for 2 days -Addressed concerns regarding Discharge AVS stating to discontinue Tamoxifen - resolved with Dr. Danford Bad and pt will remain on tamoxifen --------------------------------------------- [name] is an 41 y.o. female who presents with a chief complaint seizure/flank pain. In anticipation of discharge, pharmacy has reviewed this patient's prior to admission medication history, as well as current inpatient medications listed per the [facility].  Current medication indications, dosing, frequency, and notable side effects reviewed with patient and family. patient and family verbalized understanding of current inpatient medication regimen and is aware that the After Visit Summary when presented, will represent the most accurate medication list at discharge.   Assessment: Understanding of regimen: excellent Understanding of indications: excellent Potential of compliance: good Barriers to Obtaining Medications: No  Patient instructed to contact inpatient pharmacy team with further questions or concerns if needed.    Time spent preparing for discharge counseling: 20 Time spent counseling patient: 39   Thank you for allowing pharmacy to be a part of this patient's care.  Arrie Senate, PharmD PGY-1 Pharmacy Resident Pager: [phone] 11/09/2016

## 2016-11-12 LAB — CULTURE, BLOOD (ROUTINE X 2)
CULTURE: NO GROWTH
Culture: NO GROWTH
SPECIAL REQUESTS: ADEQUATE
Special Requests: ADEQUATE

## 2016-11-15 ENCOUNTER — Inpatient Hospital Stay: Payer: PRIVATE HEALTH INSURANCE | Admitting: Family Medicine

## 2016-11-27 ENCOUNTER — Ambulatory Visit: Payer: PRIVATE HEALTH INSURANCE | Admitting: Neurology

## 2017-01-17 ENCOUNTER — Ambulatory Visit: Payer: PRIVATE HEALTH INSURANCE | Admitting: Neurology

## 2017-03-12 ENCOUNTER — Ambulatory Visit: Payer: PRIVATE HEALTH INSURANCE | Admitting: Neurology

## 2017-03-23 ENCOUNTER — Other Ambulatory Visit: Payer: Self-pay | Admitting: Hematology and Oncology

## 2017-03-23 DIAGNOSIS — N6032 Fibrosclerosis of left breast: Secondary | ICD-10-CM

## 2017-03-28 ENCOUNTER — Ambulatory Visit: Admission: RE | Admit: 2017-03-28 | Payer: PRIVATE HEALTH INSURANCE | Source: Ambulatory Visit

## 2017-03-28 ENCOUNTER — Ambulatory Visit
Admission: RE | Admit: 2017-03-28 | Discharge: 2017-03-28 | Disposition: A | Discharge status: Home / Self Care | Payer: PRIVATE HEALTH INSURANCE | Source: Ambulatory Visit | Attending: Hematology and Oncology | Admitting: Hematology and Oncology

## 2017-03-28 DIAGNOSIS — N6032 Fibrosclerosis of left breast: Secondary | ICD-10-CM

## 2017-04-09 ENCOUNTER — Ambulatory Visit (HOSPITAL_BASED_OUTPATIENT_CLINIC_OR_DEPARTMENT_OTHER): Payer: PRIVATE HEALTH INSURANCE | Admitting: Hematology and Oncology

## 2017-04-09 ENCOUNTER — Telehealth: Payer: Self-pay | Admitting: Hematology and Oncology

## 2017-04-09 DIAGNOSIS — Z170 Estrogen receptor positive status [ER+]: Secondary | ICD-10-CM

## 2017-04-09 DIAGNOSIS — C50412 Malignant neoplasm of upper-outer quadrant of left female breast: Secondary | ICD-10-CM | POA: Diagnosis not present

## 2017-04-09 DIAGNOSIS — R21 Rash and other nonspecific skin eruption: Secondary | ICD-10-CM | POA: Diagnosis not present

## 2017-04-09 MED ORDER — VALACYCLOVIR HCL 1 G PO TABS: 1000.0000 mg | ORAL_TABLET | Freq: Two times a day (BID) | ORAL | 0 refills | Status: DC

## 2017-04-09 NOTE — Progress Notes (Signed)
Patient Care Team: Patient, No Pcp Per as PCP - General (General Practice) Alphonsa Overall, MD as Consulting Physician (General Surgery) Nicholas Lose, MD as Consulting Physician (Hematology and Oncology) Thea Silversmith, MD (Inactive) as Consulting Physician (Radiation Oncology) Rockwell Germany, RN as Registered Nurse Mauro Kaufmann, RN as Registered Nurse Holley Bouche, NP as Nurse Practitioner (Nurse Practitioner)  DIAGNOSIS:  Encounter Diagnosis  Name Primary?  . Malignant neoplasm of upper-outer quadrant of left breast in female, estrogen receptor positive ([place])     SUMMARY OF ONCOLOGIC HISTORY:   Breast cancer of upper-outer quadrant of left female breast ([place])   07/14/2014 Mammogram    Left breast UOQ 2:00 position: 1.4 x 1.1 x 1.2 cm mass with indistinct, spiculated margins. Right breast negative.      07/14/2014 Breast US    1.5 cm hypoechoic, irregular mass with spiculated margins UOQ left breast at 2:00, 15 cm from nipple. Intramammary LN 1.4 cm superior to the mass (thin cortex) with another IM LN 3 cm inferior to the index mass with mild focal cortical thickening       07/14/2014 Initial Biopsy    Left breast needle core biopsy (UOQ, 2:00 position): IDC with DCIS; grade 2, ER 100%, PR 96%, HER-2 negative ratio 1.17, Ki-67 17%;  Intramammary LN needle core biopsy (outer left breast, posterior 3:00 position): negative for malignancy.      07/24/2014 Breast MRI    Left breast: 1.4 x 1.4 x 1.3 cm enhancing mass with irregular borders at 3 o'clock position. Right breast with no abnormal enhancement. No abnormal appearing LNs.       07/29/2014 Procedure    Genetic counseling/testing: Breast/Ovarian cancer panel ([facility]) shows no clinically significant variants in ATM, BARD1, BRCA1, BRCA2, BRIP1, CDH1, CHEK2, EPCAM, FANCC, MLH1, MSH2, MSH6, NBN, PALB2, PMS2, PTEN, RAD51C, RAD51D, STK11, TP53, and XRCC2.        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 Negative margins. ER+ (100%), PR+ (96%), HER-2 repeated and negative by FISH.  Ki-67 17%.      08/06/2014 Clinical Stage    Stage IA: T1c, N0 M0      08/06/2014 Oncotype testing    Recurrence score: 7 (6% ROR).  No chemo (Janiyah Beery).       09/16/2014 - 11/09/2014 Radiation Therapy    Adjuvant RT completed Pablo Ledger). Left breast/ 50.4 Gy at 1.8 Gy per fraction x 28 fractions.  Left breast boost/ 10 Gy at 2 Gy per fraction x 5 fractions      11/25/2014 - 01/08/2015 Anti-estrogen oral therapy    Anastrozole 1 mg daily stopped due to headaches       CHIEF COMPLIANT: complaining of a rash on the left breast  INTERVAL HISTORY: [name] is a 41 year old with above-mentioned history of left breast cancer treated with lumpectomy followed by adjuvant radiation and could not tolerate antiestrogen therapy and she did not want to take tamoxifen because of risk of blood clots. She was in the hospital recently with seizures and is currently on Keppra. She noticed a rash on the left breast that started yesterday. It is also sedated with the burning sensation in the breasts.  REVIEW OF SYSTEMS:   Constitutional: Denies fevers, chills or abnormal weight loss Eyes: Denies blurriness of vision Ears, nose, mouth, throat, and face: Denies mucositis or sore throat Respiratory: Denies cough, dyspnea or  wheezes Cardiovascular: Denies palpitation, chest discomfort Gastrointestinal:  Denies nausea, heartburn or change in bowel habits Skin: Denies abnormal skin rashes Lymphatics: Denies new lymphadenopathy or easy bruising Neurological:Denies numbness, tingling or new weaknesses Behavioral/Psych: Mood is stable, no new changes  Extremities: No lower extremity edema Breast: left breast rash maculopapular nature All other systems were reviewed with the patient and are negative.  I have reviewed  the past medical history, past surgical history, social history and family history with the patient and they are unchanged from previous note.  ALLERGIES:  has No Known Allergies.  MEDICATIONS:  Current Outpatient Prescriptions  Medication Sig Dispense Refill  . acetaminophen (TYLENOL) 500 MG tablet Take 1,000 mg by mouth every 6 (six) hours as needed for mild pain.    Marland Kitchen levETIRAcetam (KEPPRA XR) 500 MG 24 hr tablet Take 1 tablet (500 mg total) by mouth daily. 30 tablet 0  . naproxen sodium (ANAPROX) 220 MG tablet Take 220 mg by mouth 2 (two) times daily as needed (pain).    . valACYclovir (VALTREX) 1000 MG tablet Take 1 tablet (1,000 mg total) by mouth 2 (two) times daily. 20 tablet 0   No current facility-administered medications for this visit.     PHYSICAL EXAMINATION: ECOG PERFORMANCE STATUS: 1 - Symptomatic but completely ambulatory  Vitals:   04/09/17 1144  BP: (!) 166/119  Pulse: (!) 56  Resp: 20  Temp: 98.2 F (36.8 C)  SpO2: 100%   Filed Weights   04/09/17 1144  Weight: 257 lb 6.4 oz (116.8 kg)    GENERAL:alert, no distress and comfortable SKIN: skin color, texture, turgor are normal, no rashes or significant lesions EYES: normal, Conjunctiva are pink and non-injected, sclera clear OROPHARYNX:no exudate, no erythema and lips, buccal mucosa, and tongue normal  NECK: supple, thyroid normal size, non-tender, without nodularity LYMPH:  no palpable lymphadenopathy in the cervical, axillary or inguinal LUNGS: clear to auscultation and percussion with normal breathing effort HEART: regular rate & rhythm and no murmurs and no lower extremity edema ABDOMEN:abdomen soft, non-tender and normal bowel sounds MUSCULOSKELETAL:no cyanosis of digits and no clubbing  NEURO: alert & oriented x 3 with fluent speech, no focal motor/sensory deficits EXTREMITIES: No lower extremity edema BREAST:there is a maculopapular rash in the left breast and grouped vesicles and extending from the  front to the axilla. (exam performed in the presence of a chaperone)  LABORATORY DATA:  I have reviewed the data as listed   Chemistry      Component Value Date/Time   NA 139 11/08/2016 0612   NA 141 02/02/2015 1508   K 3.6 11/08/2016 0612   K 3.7 02/02/2015 1508   CL 111 11/08/2016 0612   CO2 21 (L) 11/08/2016 0612   CO2 28 02/02/2015 1508   BUN 8 11/08/2016 0612   BUN 8.6 02/02/2015 1508   CREATININE 0.86 11/08/2016 0612   CREATININE 1.0 02/02/2015 1508      Component Value Date/Time   CALCIUM 7.5 (L) 11/08/2016 0612   CALCIUM 9.3 02/02/2015 1508   ALKPHOS 63 11/08/2016 0612   ALKPHOS 102 02/02/2015 1508   AST 14 (L) 11/08/2016 0612   AST 21 02/02/2015 1508   ALT 13 (L) 11/08/2016 0612   ALT 19 02/02/2015 1508   BILITOT 0.4 11/08/2016 0612   BILITOT 0.28 02/02/2015 1508       Lab Results  Component Value Date   WBC 9.8 11/08/2016   HGB 11.2 (L) 11/08/2016   HCT 35.8 (L) 11/08/2016  MCV 84.0 11/08/2016   PLT 202 11/08/2016   NEUTROABS 4.5 02/02/2015    ASSESSMENT & PLAN:  Breast cancer of upper-outer quadrant of left female breast (HCC) Left breast lumpectomy: Invasive ductal carcinoma with DCIS, no LVI, 2 sentinel nodes negative, 1.3 cm, grade 1, 100%, PR 96 wasn't, HER-2 negative Ki-67 17% T1 cN0 M0 stage IA Oncotype DX recurrence score is 7, 6% ROR status post radiation completed May 2016, patient had hysterectomy with bilateral salpingo-oophorectomy 2011) Anastrozole 1 mg daily started June 2016 stopped July 2016 due to severe headaches  Current treatment: Unable to tolerate antiestrogen therapy. Severe anxiety on Xanax Severe fatigue which is chronic  Skin rash: It is maculopapular in group areas on the left breast extending from the mid chest wall to the anterior axillary fold highly suspicious for shingles. I sent a prescription for Valtrex 1000 last by mouth twice a day 10 days I instructed her to call us if the rash does not get better.  Return to  clinic in 1 year for follow-up   I spent 25 minutes talking to the patient of which more than half was spent in counseling and coordination of care.  No orders of the defined types were placed in this encounter.  The patient has a good understanding of the overall plan. she agrees with it. she will call with any problems that may develop before the next visit here.   Rulon Eisenmenger, MD 04/09/17

## 2017-04-09 NOTE — Assessment & Plan Note (Addendum)
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 Severe anxiety on Xanax Severe fatigue which is chronic  Skin rash: It is maculopapular in group areas on the left breast extending from the mid chest wall to the anterior axillary fold highly suspicious for shingles. I sent a prescription for Valtrex 1000 last by mouth twice a day 10 days I instructed her to call us if the rash does not get better.  Return to clinic in 1 year for follow-up

## 2017-04-09 NOTE — Telephone Encounter (Signed)
Gave patient avs and calendar with appts per 10/15 los.

## 2017-05-03 ENCOUNTER — Telehealth: Payer: Self-pay

## 2017-05-03 ENCOUNTER — Other Ambulatory Visit: Payer: Self-pay

## 2017-05-03 MED ORDER — VALACYCLOVIR HCL 1 G PO TABS: 1000.0000 mg | ORAL_TABLET | Freq: Two times a day (BID) | ORAL | 0 refills | Status: DC

## 2017-05-03 NOTE — Telephone Encounter (Signed)
Spoke with [name] regarding her VM, her shingles is not improving. Informed her I would make Dr. Lindi Adie aware and get back to her.  Cyndia Bent RN

## 2017-05-03 NOTE — Telephone Encounter (Signed)
Pt called concerning her shingles. She saw Dr. Lindi Adie 3 weeks ago and he started her on Valtrex 1000mg  BID for 10 days. Symptoms have not subsided. Valtrex refilled and pt told to try that again. Informed pt that if her shingles continues to get worse she should go to the emergency dept as she may need IV antivirals per Dr. Lindi Adie. Pt has good understanding of plan.  Cyndia Bent RN

## 2017-05-04 ENCOUNTER — Emergency Department (HOSPITAL_COMMUNITY): Payer: PRIVATE HEALTH INSURANCE

## 2017-05-04 ENCOUNTER — Emergency Department (HOSPITAL_COMMUNITY)
Admission: EM | Admit: 2017-05-04 | Discharge: 2017-05-04 | Disposition: A | Discharge status: Home / Self Care | Payer: PRIVATE HEALTH INSURANCE | Attending: Emergency Medicine | Admitting: Emergency Medicine

## 2017-05-04 ENCOUNTER — Other Ambulatory Visit: Payer: Self-pay

## 2017-05-04 ENCOUNTER — Encounter (HOSPITAL_COMMUNITY): Payer: Self-pay | Admitting: Emergency Medicine

## 2017-05-04 DIAGNOSIS — Z79899 Other long term (current) drug therapy: Secondary | ICD-10-CM | POA: Insufficient documentation

## 2017-05-04 DIAGNOSIS — J181 Lobar pneumonia, unspecified organism: Secondary | ICD-10-CM | POA: Diagnosis not present

## 2017-05-04 DIAGNOSIS — J45909 Unspecified asthma, uncomplicated: Secondary | ICD-10-CM | POA: Diagnosis not present

## 2017-05-04 DIAGNOSIS — J189 Pneumonia, unspecified organism: Secondary | ICD-10-CM

## 2017-05-04 DIAGNOSIS — R109 Unspecified abdominal pain: Secondary | ICD-10-CM | POA: Diagnosis present

## 2017-05-04 LAB — CBC WITH DIFFERENTIAL/PLATELET
BASOS PCT: 0 %
Basophils Absolute: 0 10*3/uL (ref 0.0–0.1)
Eosinophils Absolute: 0 10*3/uL (ref 0.0–0.7)
Eosinophils Relative: 0 %
HEMATOCRIT: 43.2 % (ref 36.0–46.0)
HEMOGLOBIN: 14.7 g/dL (ref 12.0–15.0)
Lymphocytes Relative: 5 %
Lymphs Abs: 0.8 10*3/uL (ref 0.7–4.0)
MCH: 27.4 pg (ref 26.0–34.0)
MCHC: 34 g/dL (ref 30.0–36.0)
MCV: 80.4 fL (ref 78.0–100.0)
MONOS PCT: 5 %
Monocytes Absolute: 0.7 10*3/uL (ref 0.1–1.0)
NEUTROS ABS: 14.9 10*3/uL — AB (ref 1.7–7.7)
NEUTROS PCT: 90 %
Platelets: 286 10*3/uL (ref 150–400)
RBC: 5.37 MIL/uL — ABNORMAL HIGH (ref 3.87–5.11)
RDW: 15.6 % — ABNORMAL HIGH (ref 11.5–15.5)
WBC: 16.4 10*3/uL — ABNORMAL HIGH (ref 4.0–10.5)

## 2017-05-04 LAB — COMPREHENSIVE METABOLIC PANEL
ALBUMIN: 4.2 g/dL (ref 3.5–5.0)
ALK PHOS: 97 U/L (ref 38–126)
ALT: 22 U/L (ref 14–54)
AST: 28 U/L (ref 15–41)
Anion gap: 10 (ref 5–15)
BILIRUBIN TOTAL: 0.7 mg/dL (ref 0.3–1.2)
BUN: 13 mg/dL (ref 6–20)
CALCIUM: 9.3 mg/dL (ref 8.9–10.3)
CO2: 23 mmol/L (ref 22–32)
CREATININE: 1.13 mg/dL — AB (ref 0.44–1.00)
Chloride: 104 mmol/L (ref 101–111)
GFR calc Af Amer: >60 mL/min (ref >60)
GFR calc non Af Amer: 60 mL/min — ABNORMAL LOW (ref >60)
GLUCOSE: 132 mg/dL — AB (ref 65–99)
Potassium: 4.1 mmol/L (ref 3.5–5.1)
Sodium: 137 mmol/L (ref 135–145)
Total Protein: 8.5 g/dL — ABNORMAL HIGH (ref 6.5–8.1)

## 2017-05-04 LAB — PREGNANCY, URINE: PREG TEST UR: NEGATIVE

## 2017-05-04 LAB — URINALYSIS, ROUTINE W REFLEX MICROSCOPIC
BILIRUBIN URINE: NEGATIVE
Glucose, UA: NEGATIVE mg/dL
KETONES UR: NEGATIVE mg/dL
Leukocytes, UA: NEGATIVE
NITRITE: NEGATIVE
PROTEIN: NEGATIVE mg/dL
Specific Gravity, Urine: 1.023 (ref 1.005–1.030)
pH: 5 (ref 5.0–8.0)

## 2017-05-04 LAB — I-STAT CG4 LACTIC ACID, ED: Lactic Acid, Venous: 1.13 mmol/L (ref 0.5–1.9)

## 2017-05-04 MED ORDER — ONDANSETRON HCL 4 MG/2ML IJ SOLN
4.0000 mg | Freq: Once | INTRAMUSCULAR | Status: AC
  Administered 2017-05-04: 4 mg via INTRAVENOUS
  Filled 2017-05-04: qty 2

## 2017-05-04 MED ORDER — IOPAMIDOL (ISOVUE-300) INJECTION 61%
INTRAVENOUS | Status: AC
  Filled 2017-05-04: qty 100

## 2017-05-04 MED ORDER — ACETAMINOPHEN 325 MG PO TABS
650.0000 mg | ORAL_TABLET | Freq: Once | ORAL | Status: AC | PRN
  Administered 2017-05-04: 650 mg via ORAL
  Filled 2017-05-04: qty 2

## 2017-05-04 MED ORDER — MORPHINE SULFATE (PF) 4 MG/ML IV SOLN
4.0000 mg | Freq: Once | INTRAVENOUS | Status: AC
  Administered 2017-05-04: 4 mg via INTRAVENOUS
  Filled 2017-05-04: qty 1

## 2017-05-04 MED ORDER — IOPAMIDOL (ISOVUE-300) INJECTION 61%
100.0000 mL | Freq: Once | INTRAVENOUS | Status: AC | PRN
  Administered 2017-05-04: 100 mL via INTRAVENOUS

## 2017-05-04 MED ORDER — AZITHROMYCIN 250 MG PO TABS
500.0000 mg | ORAL_TABLET | Freq: Once | ORAL | Status: AC
  Administered 2017-05-04: 500 mg via ORAL
  Filled 2017-05-04: qty 2

## 2017-05-04 MED ORDER — SODIUM CHLORIDE 0.9 % IV BOLUS (SEPSIS)
1000.0000 mL | Freq: Once | INTRAVENOUS | Status: AC
Start: 2017-05-04 — End: 2017-05-04
  Administered 2017-05-04: 1000 mL via INTRAVENOUS

## 2017-05-04 MED ORDER — AZITHROMYCIN 250 MG PO TABS: ORAL_TABLET | ORAL | 0 refills | Status: DC

## 2017-05-04 MED ORDER — DEXTROSE 5 % IV SOLN
1.0000 g | Freq: Once | INTRAVENOUS | Status: AC
  Administered 2017-05-04: 1 g via INTRAVENOUS
  Filled 2017-05-04: qty 10

## 2017-05-04 MED ORDER — AMOXICILLIN 500 MG PO CAPS: 1000.0000 mg | ORAL_CAPSULE | Freq: Two times a day (BID) | ORAL | 0 refills | Status: DC

## 2017-05-04 NOTE — ED Provider Notes (Signed)
[facility] DEPT Provider Note   CSN: [id number] Arrival date & time: 05/04/17  0857     History   Chief Complaint Chief Complaint  Patient presents with  . Abdominal Pain    HPI [name] is a 41 y.o. female.  The history is provided by the patient and medical records. No language interpreter was used.  Abdominal Pain   Associated symptoms include fever, nausea and vomiting. Pertinent negatives include diarrhea and dysuria.   [name] is a 41 y.o. female  with a PMH of seizures, asthma, history of breast cancer who presents to the Emergency Department complaining of cough, congestion x 1 week. Yesterday, she started experiencing nausea and RLQ abdominal pain. Two episodes of emesis yesterday. + fever and chills since yesterday as well. No medication prior to arrival for symptoms. No known sick contacts. Not on immune suppressants / steroids. No back pain or urinary symptoms. No chest pain or shortness of breath.   Past Medical History:  Diagnosis Date  . Asthma    no current med.  . Breast cancer of upper-outer quadrant of left female breast ([place]) 07/16/2014  . Heart murmur    as a child  . History of seizures    during pregnancies; last seizure 3 yrs. ago  . Seizures (HCC)    no medications  . Stress headaches     Patient Active Problem List   Diagnosis Date Noted  . Seizure ([place]) 11/07/2016  . Left lower lobe pneumonia ([place]) 11/07/2016  . Right flank pain 11/07/2016  . Anxiety 07/05/2015  . Genetic testing 08/18/2014  . Breast cancer of upper-outer quadrant of left female breast ([place]) 07/16/2014  . 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 12/20/2009  . SEIZURE DISORDER 07/07/2009  . DEPRESSION, RECURRENT 08/24/2008  . HYPERSOMNIA, IDIOPATHIC 08/24/2008  . 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 ABDOMINAL HYSTERECTOMY    . BREAST SURGERY     lumpectomy and lymph nodes  . CESAREAN SECTION  [id number]  . CESAREAN SECTION WITH BILATERAL TUBAL LIGATION  03/03/2003  . CYSTO  12/20/2009  . HYSTEROSCOPY W/ ENDOMETRIAL ABLATION  11/11/2007  . LYSIS OF ADHESION  12/20/2009  . TOTAL ABDOMINAL HYSTERECTOMY W/ BILATERAL SALPINGOOPHORECTOMY  12/20/2009  . TYMPANOSTOMY TUBE PLACEMENT      OB History    No data available       Home Medications    Prior to Admission medications   Medication Sig Start Date End Date Taking? Authorizing Provider  acetaminophen (TYLENOL) 500 MG tablet Take 1,000 mg by mouth every 6 (six) hours as needed for mild pain.   Yes [provider]  amoxicillin (AMOXIL) 500 MG capsule Take 2 capsules (1,000 mg total) 2 (two) times daily by mouth. 05/04/17   Ward, Ozella Almond, PA-C  azithromycin (ZITHROMAX) 250 MG tablet Take 1 tablet (250 mg total) by mouth daily beginning tomorrow, 05/05/17.  You received your first dose in the ER today. 05/04/17   Ward, Ozella Almond, PA-C  levETIRAcetam (KEPPRA XR) 500 MG 24 hr tablet Take 1 tablet (500 mg total) by mouth daily. Patient not taking: Reported on 05/04/2017 11/10/16   Molt, Bethany, DO  naproxen sodium (ANAPROX) 220 MG tablet Take 220 mg by mouth 2 (two) times daily as needed (pain).    [provider]  valACYclovir (VALTREX) 1000 MG tablet Take 1 tablet (1,000 mg total) 2 (two) times daily by mouth. Patient  not taking: Reported on 05/04/2017 05/03/17   Nicholas Lose, MD    Family History Family History  Problem Relation Age of Onset  . Hypertension Mother   . Breast cancer Other        MGM's sister  . Breast cancer Maternal Grandmother        dx in her 29s    Social History Social History   Tobacco Use  . Smoking status: Never Smoker  . Smokeless tobacco: Never Used  Substance Use Topics  . Alcohol use: Yes    Comment: occ  . Drug use: No     Allergies   Patient has no known allergies.   Review of Systems Review of Systems    Constitutional: Positive for chills and fever.  HENT: Positive for congestion.   Respiratory: Positive for cough. Negative for shortness of breath.   Gastrointestinal: Positive for abdominal pain, nausea and vomiting. Negative for blood in stool and diarrhea.  Genitourinary: Negative for difficulty urinating, dysuria and vaginal discharge.  Musculoskeletal: Negative for back pain.  All other systems reviewed and are negative.    Physical Exam Updated Vital Signs BP (!) 111/46   Pulse 89   Temp (!) 101.1 F (38.4 C) (Oral)   Resp 14   SpO2 97%   Physical Exam  Constitutional: She is oriented to person, place, and time. She appears well-developed and well-nourished. No distress.  HENT:  Head: Normocephalic and atraumatic.  Neck: Neck supple.  Cardiovascular: Regular rhythm and normal heart sounds.  No murmur heard. Tachycardic.   Pulmonary/Chest: Effort normal. No respiratory distress.  Faint bibasilar crackles. 100% O2 on RA. Speaking in full sentences without difficulty.  Abdominal: Soft. She exhibits no distension. There is tenderness (RLQ).  Neurological: She is alert and oriented to person, place, and time.  Skin: Skin is warm and dry.  Nursing note and vitals reviewed.    ED Treatments / Results  Labs (all labs ordered are listed, but only abnormal results are displayed) Labs Reviewed  COMPREHENSIVE METABOLIC PANEL - Abnormal; Notable for the following components:      Result Value   Glucose, Bld 132 (*)    Creatinine, Ser 1.13 (*)    Total Protein 8.5 (*)    GFR calc non Af Amer 60 (*)    All other components within normal limits  CBC WITH DIFFERENTIAL/PLATELET - Abnormal; Notable for the following components:   WBC 16.4 (*)    RBC 5.37 (*)    RDW 15.6 (*)    Neutro Abs 14.9 (*)    All other components within normal limits  URINALYSIS, ROUTINE W REFLEX MICROSCOPIC - Abnormal; Notable for the following components:   Hgb urine dipstick MODERATE (*)     Bacteria, UA RARE (*)    Squamous Epithelial / LPF 0-5 (*)    All other components within normal limits  CULTURE, BLOOD (ROUTINE X 2)  CULTURE, BLOOD (ROUTINE X 2)  PREGNANCY, URINE  I-STAT CG4 LACTIC ACID, ED    EKG  EKG Interpretation None       Radiology Dg Chest 2 View  Result Date: 05/04/2017 CLINICAL DATA:  New onset fever, body aches, lower abd pain since yesterday, nonsmoker, states hx of lung ca, no other chest complaints EXAM: CHEST  2 VIEW COMPARISON:  11/07/2016 FINDINGS: Heart size is normal. There are patchy infiltrates in the lung bases bilaterally, left greater than right. No pleural effusions. No pulmonary edema. Visualized osseous structures have a normal appearance. IMPRESSION: Bilateral  lower lobe infiltrates, left greater than right. Followup PA and lateral chest X-ray is recommended in 3-4 weeks following trial of antibiotic therapy to ensure resolution and exclude underlying malignancy. Electronically Signed   By: Nolon Nations M.D.   On: 05/04/2017 11:25   Ct Abdomen Pelvis W Contrast  Result Date: 05/04/2017 CLINICAL DATA:  Right lower chronic pain with nausea vomiting and diarrhea. EXAM: CT ABDOMEN AND PELVIS WITH CONTRAST TECHNIQUE: Multidetector CT imaging of the abdomen and pelvis was performed using the standard protocol following bolus administration of intravenous contrast. CONTRAST:  Contrast 100 cc Isovue-300 COMPARISON:  11/07/2016 FINDINGS: Lower chest:  Basilar atelectasis noted bilaterally. Hepatobiliary: No focal abnormality within the liver parenchyma. There is no evidence for gallstones, gallbladder wall thickening, or pericholecystic fluid. No intrahepatic or extrahepatic biliary dilation. Pancreas: No focal mass lesion. No dilatation of the main duct. No intraparenchymal cyst. No peripancreatic edema. Spleen: No splenomegaly. No focal mass lesion. Adrenals/Urinary Tract: No adrenal nodule or mass. Kidneys are unremarkable. No evidence for  hydroureter. The urinary bladder appears normal for the degree of distention. Stomach/Bowel: Stomach is nondistended. No gastric wall thickening. No evidence of outlet obstruction. Duodenum is normally positioned as is the ligament of Treitz. No small bowel wall thickening. No small bowel dilatation. The terminal ileum is normal. The appendix is normal. No gross colonic mass. No colonic wall thickening. No substantial diverticular change. Vascular/Lymphatic: No abdominal aortic aneurysm. No abdominal aortic atherosclerotic calcification. There is no gastrohepatic or hepatoduodenal ligament lymphadenopathy. No intraperitoneal or retroperitoneal lymphadenopathy. No pelvic sidewall lymphadenopathy. Prominent right groin lymph nodes are similar to prior. Reproductive: Uterus surgically absent.  There is no adnexal mass. Other: No intraperitoneal free fluid. Musculoskeletal: Bone windows reveal no worrisome lytic or sclerotic osseous lesions. IMPRESSION: 1. No acute findings in the abdomen or pelvis to explain the right lower quadrant pain. Terminal ileum and appendix are normal. No right adnexal mass. Electronically Signed   By: Misty Stanley M.D.   On: 05/04/2017 11:47    Procedures Procedures (including critical care time)  Medications Ordered in ED Medications  iopamidol (ISOVUE-300) 61 % injection (not administered)  acetaminophen (TYLENOL) tablet 650 mg (650 mg Oral Given 05/04/17 1049)  sodium chloride 0.9 % bolus 1,000 mL (0 mLs Intravenous Stopped 05/04/17 1313)  morphine 4 MG/ML injection 4 mg (4 mg Intravenous Given 05/04/17 1049)  ondansetron (ZOFRAN) injection 4 mg (4 mg Intravenous Given 05/04/17 1049)  iopamidol (ISOVUE-300) 61 % injection 100 mL (100 mLs Intravenous Contrast Given 05/04/17 1116)  cefTRIAXone (ROCEPHIN) 1 g in dextrose 5 % 50 mL IVPB (1 g Intravenous New Bag/Given 05/04/17 1347)  azithromycin (ZITHROMAX) tablet 500 mg (500 mg Oral Given 05/04/17 1347)     Initial Impression /  Assessment and Plan / ED Course  I have reviewed the triage vital signs and the nursing notes.  Pertinent labs & imaging results that were available during my care of the patient were reviewed by me and considered in my medical decision making (see chart for details).    [name] is a 41 y.o. female who presents to ED for cough, congestion and RLQ abdominal pain. Upon ER arrival, patient was febrile and mildly tachycardic. Bibasilar crackles to lower lung fields. 100% O2 on RA.  Speaking in full sentences without difficulty.  She does have a focal area of tenderness to the right lower quadrant.  CT abdomen and pelvis was negative.  Pelvic pathology considered, however patient has had bilateral BSO and hysterectomy.  No vaginal discharge or pelvic complaints. Community acquired pneumonia likely source. Given Rocephin and azithro in ED. Tolerating PO. Spoke at length about return precautions and home care instructions. PCP follow up encouraged. All questions answered.    Patient discussed with Dr. Ellender Hose who agrees with treatment plan.    Final Clinical Impressions(s) / ED Diagnoses   Final diagnoses:  Pneumonia of both lower lobes due to infectious organism [facility])    ED Discharge Orders        Ordered    azithromycin (ZITHROMAX) 250 MG tablet     05/04/17 1247    amoxicillin (AMOXIL) 500 MG capsule  2 times daily     05/04/17 1247       Ward, Ozella Almond, PA-C 05/04/17 1507    Duffy Bruce, MD 05/05/17 1348

## 2017-05-04 NOTE — ED Triage Notes (Signed)
Pt complaint of right abdominal pain with n/v/d onset yesterday; pt continues to verbalizes fever.

## 2017-05-04 NOTE — ED Notes (Signed)
Bed: WA04 Expected date:  Expected time:  Means of arrival:  Comments: 

## 2017-05-04 NOTE — Discharge Instructions (Signed)
Please take all of your antibiotics until finished! Follow up with your doctor in regards to your hospital visit. Return to the emergency department if symptoms worsen, become progressive, or become more concerning. ° °Pneumonia, Adult °Pneumonia is an infection of the lungs.  ° °CAUSES °Pneumonia may be caused by bacteria or a virus. Usually, these infections are caused by breathing infectious particles into the lungs (respiratory tract). ° °SYMPTOMS  °Cough.  °Fever.  °Chest pain.  °Increased rate of breathing.  °Wheezing.  °Mucus production.  ° °DIAGNOSIS  °If you have the common symptoms of pneumonia, your caregiver will typically confirm the diagnosis with a chest X-ray. The X-ray will show an abnormality in the lung (pulmonary infiltrate) if you have pneumonia. Other tests of your blood, urine, or sputum may be done to find the specific cause of your pneumonia. Your caregiver may also do tests (blood gases or pulse oximetry) to see how well your lungs are working. ° °TREATMENT  °Some forms of pneumonia may be spread to other people when you cough or sneeze. You may be asked to wear a mask before and during your exam. Pneumonia that is caused by bacteria is treated with antibiotic medicine. Pneumonia that is caused by the influenza virus may be treated with an antiviral medicine. Most other viral infections must run their course. These infections will not respond to antibiotics. ° °HOME CARE INSTRUCTIONS  °Cough suppressants may be used if you are losing too much rest. However, coughing protects you by clearing your lungs. You should avoid using cough suppressants if you can.  °Your healthcare provider may have prescribed medicine if he or she thinks your pneumonia is caused by a bacteria or influenza. Finish your medicine even if you start to feel better.  °Do not smoke. Smoking is a common cause of bronchitis and can contribute to pneumonia. If you are a smoker and continue to smoke, your cough may last  several weeks after your pneumonia has cleared.  °A cold steam vaporizer or humidifier in your room or home may help loosen mucus.  °Coughing is often worse at night. Sleeping in a semi-upright position in a recliner or using a couple pillows under your head will help with this.  °Get rest as you feel it is needed. Your body will usually let you know when you need to rest.  ° °SEEK IMMEDIATE MEDICAL CARE IF:  °Your illness becomes worse. This is especially true if you are elderly or weakened from any other disease.  °You cannot control your cough with suppressants and are losing sleep.  °You begin coughing up blood.  °You develop pain which is getting worse or is uncontrolled with medicines.  °You have a fever.  °Any of the symptoms which initially brought you in for treatment are getting worse rather than better.  °You develop shortness of breath or chest pain.  ° °MAKE SURE YOU:  °Understand these instructions.  °Will watch your condition.  °Will get help right away if you are not doing well or get worse.  °

## 2017-05-09 LAB — CULTURE, BLOOD (ROUTINE X 2)
CULTURE: NO GROWTH
CULTURE: NO GROWTH
SPECIAL REQUESTS: ADEQUATE
Special Requests: ADEQUATE

## 2017-07-12 ENCOUNTER — Other Ambulatory Visit: Payer: Self-pay | Admitting: Hematology and Oncology

## 2017-07-12 DIAGNOSIS — Z9889 Other specified postprocedural states: Secondary | ICD-10-CM

## 2017-08-29 ENCOUNTER — Ambulatory Visit: Payer: PRIVATE HEALTH INSURANCE

## 2017-08-29 ENCOUNTER — Ambulatory Visit
Admission: RE | Admit: 2017-08-29 | Discharge: 2017-08-29 | Disposition: A | Discharge status: Home / Self Care | Payer: PRIVATE HEALTH INSURANCE | Source: Ambulatory Visit | Attending: Hematology and Oncology | Admitting: Hematology and Oncology

## 2017-08-29 DIAGNOSIS — Z9889 Other specified postprocedural states: Secondary | ICD-10-CM

## 2017-08-29 HISTORY — DX: Personal history of irradiation: Z92.3

## 2017-12-16 ENCOUNTER — Other Ambulatory Visit: Payer: Self-pay

## 2017-12-16 ENCOUNTER — Emergency Department (HOSPITAL_COMMUNITY)
Admission: EM | Admit: 2017-12-16 | Discharge: 2017-12-16 | Disposition: A | Discharge status: Home / Self Care | Payer: PRIVATE HEALTH INSURANCE | Attending: Emergency Medicine | Admitting: Emergency Medicine

## 2017-12-16 ENCOUNTER — Encounter (HOSPITAL_COMMUNITY): Payer: Self-pay | Admitting: Emergency Medicine

## 2017-12-16 DIAGNOSIS — Z923 Personal history of irradiation: Secondary | ICD-10-CM | POA: Insufficient documentation

## 2017-12-16 DIAGNOSIS — Z853 Personal history of malignant neoplasm of breast: Secondary | ICD-10-CM | POA: Diagnosis not present

## 2017-12-16 DIAGNOSIS — R21 Rash and other nonspecific skin eruption: Secondary | ICD-10-CM | POA: Diagnosis present

## 2017-12-16 DIAGNOSIS — R609 Edema, unspecified: Secondary | ICD-10-CM | POA: Diagnosis not present

## 2017-12-16 DIAGNOSIS — L259 Unspecified contact dermatitis, unspecified cause: Secondary | ICD-10-CM | POA: Diagnosis not present

## 2017-12-16 DIAGNOSIS — J45909 Unspecified asthma, uncomplicated: Secondary | ICD-10-CM | POA: Insufficient documentation

## 2017-12-16 LAB — BASIC METABOLIC PANEL
ANION GAP: 7 (ref 5–15)
BUN: 16 mg/dL (ref 6–20)
CALCIUM: 9.2 mg/dL (ref 8.9–10.3)
CO2: 27 mmol/L (ref 22–32)
Chloride: 106 mmol/L (ref 101–111)
Creatinine, Ser: 0.91 mg/dL (ref 0.44–1.00)
GFR calc Af Amer: >60 mL/min (ref >60)
GLUCOSE: 106 mg/dL — AB (ref 65–99)
Potassium: 3.9 mmol/L (ref 3.5–5.1)
Sodium: 140 mmol/L (ref 135–145)

## 2017-12-16 LAB — CBC WITH DIFFERENTIAL/PLATELET
BASOS ABS: 0 10*3/uL (ref 0.0–0.1)
Basophils Relative: 1 %
Eosinophils Absolute: 0.1 10*3/uL (ref 0.0–0.7)
Eosinophils Relative: 2 %
HCT: 40.1 % (ref 36.0–46.0)
Hemoglobin: 13.1 g/dL (ref 12.0–15.0)
Lymphocytes Relative: 24 %
Lymphs Abs: 1.5 10*3/uL (ref 0.7–4.0)
MCH: 27 pg (ref 26.0–34.0)
MCHC: 32.7 g/dL (ref 30.0–36.0)
MCV: 82.5 fL (ref 78.0–100.0)
MONO ABS: 0.4 10*3/uL (ref 0.1–1.0)
Monocytes Relative: 6 %
Neutro Abs: 4.1 10*3/uL (ref 1.7–7.7)
Neutrophils Relative %: 67 %
PLATELETS: 305 10*3/uL (ref 150–400)
RBC: 4.86 MIL/uL (ref 3.87–5.11)
RDW: 15.2 % (ref 11.5–15.5)
WBC: 6.1 10*3/uL (ref 4.0–10.5)

## 2017-12-16 MED ORDER — HYDROCORTISONE 1 % EX CREA: TOPICAL_CREAM | CUTANEOUS | 0 refills | Status: DC

## 2017-12-16 MED ORDER — DIPHENHYDRAMINE HCL 25 MG PO TABS: 25.0000 mg | ORAL_TABLET | Freq: Four times a day (QID) | ORAL | 0 refills | Status: DC

## 2017-12-16 NOTE — ED Triage Notes (Signed)
Pt arriving with pain and rash on left breast. Pt reports having breast cancer on left side and had lymphnodes removed on that side. Treatment ended in 2016. Rash is warm to the touch.

## 2017-12-16 NOTE — ED Provider Notes (Signed)
[facility] DEPT Provider Note   CSN: [id number] Arrival date & time: 12/16/17  0355     History   Chief Complaint Chief Complaint  Patient presents with  . Rash    HPI [name] is a 42 y.o. female.  HPI [name] is a 42 y.o. female with history of left breast cancer, currently in remission, presents to emergency department complaining of rash to the left chest wall.  Patient states the rash initially started over the breast area 2 days ago.  Since then has spread down to the upper abdomen.  Rash is very itchy, red.  She states her breast is very painful as well.  She denies any fever or chills.  No injuries.  No new products including no new soaps, detergents, lotions, clothing.  She denies a history of similar rash in the past.  No treatment prior to coming in.  Past Medical History:  Diagnosis Date  . Asthma    no current med.  . Breast cancer of upper-outer quadrant of left female breast ([place]) 07/16/2014  . Heart murmur    as a child  . History of seizures    during pregnancies; last seizure 3 yrs. ago  . Personal history of radiation therapy 2016  . Seizures (HCC)    no medications  . Stress headaches     Patient Active Problem List   Diagnosis Date Noted  . Seizure ([place]) 11/07/2016  . Left lower lobe pneumonia ([place]) 11/07/2016  . Right flank pain 11/07/2016  . Anxiety 07/05/2015  . Genetic testing 08/18/2014  . Breast cancer of upper-outer quadrant of left female breast ([place]) 07/16/2014  . 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 12/20/2009  . SEIZURE DISORDER 07/07/2009  . DEPRESSION, RECURRENT 08/24/2008  . HYPERSOMNIA, IDIOPATHIC 08/24/2008  . 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 ABDOMINAL HYSTERECTOMY    . BREAST BIOPSY Left 07/14/2014   x2  . BREAST BIOPSY Left 08/21/2016  . BREAST LUMPECTOMY Left 08/06/2014  . BREAST SURGERY       lumpectomy and lymph nodes  . CESAREAN SECTION  [id number]  . CESAREAN SECTION WITH BILATERAL TUBAL LIGATION  03/03/2003  . CYSTO  12/20/2009  . HYSTEROSCOPY W/ ENDOMETRIAL ABLATION  11/11/2007  . LYSIS OF ADHESION  12/20/2009  . TOTAL ABDOMINAL HYSTERECTOMY W/ BILATERAL SALPINGOOPHORECTOMY  12/20/2009  . TYMPANOSTOMY TUBE PLACEMENT       OB History   None      Home Medications    Prior to Admission medications   Medication Sig Start Date End Date Taking? Authorizing Provider  acetaminophen (TYLENOL) 500 MG tablet Take 1,000 mg by mouth every 6 (six) hours as needed for mild pain.    [provider]  amoxicillin (AMOXIL) 500 MG capsule Take 2 capsules (1,000 mg total) 2 (two) times daily by mouth. 05/04/17   Ward, Ozella Almond, PA-C  azithromycin (ZITHROMAX) 250 MG tablet Take 1 tablet (250 mg total) by mouth daily beginning tomorrow, 05/05/17.  You received your first dose in the ER today. 05/04/17   Ward, Ozella Almond, PA-C  levETIRAcetam (KEPPRA XR) 500 MG 24 hr tablet Take 1 tablet (500 mg total) by mouth daily. Patient not taking: Reported on 05/04/2017 11/10/16   Molt, Bethany, DO  naproxen sodium (ANAPROX) 220 MG tablet Take 220 mg by mouth 2 (two) times daily as needed (pain).    [provider]  valACYclovir Estell Harpin)  1000 MG tablet Take 1 tablet (1,000 mg total) 2 (two) times daily by mouth. Patient not taking: Reported on 05/04/2017 05/03/17   Nicholas Lose, MD    Family History Family History  Problem Relation Age of Onset  . Hypertension Mother   . Breast cancer Other        MGM's sister  . Breast cancer Maternal Grandmother        dx in her 94s    Social History Social History   Tobacco Use  . Smoking status: Never Smoker  . Smokeless tobacco: Never Used  Substance Use Topics  . Alcohol use: Yes    Comment: occ  . Drug use: No     Allergies   Patient has no known allergies.   Review of Systems Review of Systems  Constitutional:  Negative for chills and fever.  Respiratory: Negative for cough, chest tightness and shortness of breath.   Cardiovascular: Negative for chest pain, palpitations and leg swelling.  Gastrointestinal: Negative for abdominal pain, diarrhea, nausea and vomiting.  Genitourinary: Negative for dysuria, flank pain, pelvic pain, vaginal bleeding, vaginal discharge and vaginal pain.  Musculoskeletal: Negative for arthralgias, myalgias, neck pain and neck stiffness.  Skin: Negative for rash.  Neurological: Negative for dizziness, weakness and headaches.  All other systems reviewed and are negative.    Physical Exam Updated Vital Signs BP 124/72 (BP Location: Left Arm)   Pulse 62   Temp 98.1 F (36.7 C) (Oral)   Resp 18   Ht 5\' 3"  (1.6 m)   Wt 117.8 kg (259 lb 9.6 oz)   SpO2 100%   BMI 45.99 kg/m    Physical Exam  Constitutional: She appears well-developed and well-nourished. No distress.  HENT:  Head: Normocephalic.  Eyes: Conjunctivae are normal.  Neck: Neck supple.  Cardiovascular: Normal rate, regular rhythm and normal heart sounds.  Pulmonary/Chest: Effort normal and breath sounds normal. No respiratory distress. She has no wheezes. She has no rales.  Left breast is erythematous diffusely with small papular rash. Nipple normal with no discharge. No areas of increased induration or palpable mass or an abscess although exam is somewhat limited to breast size and tenderness.  Rash does extend into the abdomen.  Very itchy.  Abdominal: Soft. Bowel sounds are normal. She exhibits no distension. There is no tenderness. There is no rebound.  Musculoskeletal: She exhibits no edema.  Neurological: She is alert.  Skin: Skin is warm and dry.  Psychiatric: She has a normal mood and affect. Her behavior is normal.  Nursing note and vitals reviewed.    ED Treatments / Results  Labs (all labs ordered are listed, but only abnormal results are displayed) Labs Reviewed  BASIC METABOLIC PANEL -  Abnormal; Notable for the following components:      Result Value   Glucose, Bld 106 (*)    All other components within normal limits  CBC WITH DIFFERENTIAL/PLATELET    EKG None  Radiology No results found.  Procedures Procedures (including critical care time)  Medications Ordered in ED Medications - No data to display   Initial Impression / Assessment and Plan / ED Course  I have reviewed the triage vital signs and the nursing notes.  Pertinent labs & imaging results that were available during my care of the patient were reviewed by me and considered in my medical decision making (see chart for details).     Patient with left breast rash, and some tenderness.  I do not feel any masses  or definite abscess formation my exam.  Initially ordered ultrasound, however ultrasound tech here complaint to me that she can only do a focal ultrasound on the area of most induration.  There is no such area, bedside ultrasound performed by Dr. Leonette Monarch.  No obvious cellulitic changes or abscess on bedside ultrasound.  Will treat for contact dermatitis at this time.  I will give her hydrocortisone cream and Benadryl.  Instructed to call her oncologist on Monday which is tomorrow if continues to have pain and swelling to the breast or if the rash gets worse.  She will need to go to the breast center for further imaging at that time. PT agrees. Blood work is normal today.   Vitals:   12/16/17 0408  BP: 124/72  Pulse: 62  Resp: 18  Temp: 98.1 F (36.7 C)  TempSrc: Oral  SpO2: 100%  Weight: 117.8 kg (259 lb 9.6 oz)  Height: 5\' 3"  (1.6 m)     Final Clinical Impressions(s) / ED Diagnoses   Final diagnoses:  Swelling  Contact dermatitis, unspecified contact dermatitis type, unspecified trigger    ED Discharge Orders        Ordered    diphenhydrAMINE (BENADRYL) 25 MG tablet  Every 6 hours     12/16/17 0632    hydrocortisone cream 1 %     12/16/17 0632      Jeannett Senior,  PA-C 12/16/17 7078  Fatima Blank, MD 12/16/17 [phone]

## 2017-12-16 NOTE — ED Notes (Signed)
ED Provider at bedside. 

## 2017-12-16 NOTE — Discharge Instructions (Addendum)
Hydrocortisone cream to the rash area.  Benadryl for itching and rash.  Follow-up with your oncologist and breast center on Monday if symptoms are worsening.

## 2018-04-11 ENCOUNTER — Inpatient Hospital Stay: Payer: PRIVATE HEALTH INSURANCE | Attending: Hematology and Oncology | Admitting: Hematology and Oncology

## 2018-04-11 ENCOUNTER — Inpatient Hospital Stay: Payer: PRIVATE HEALTH INSURANCE

## 2018-04-11 ENCOUNTER — Telehealth: Payer: Self-pay | Admitting: Hematology and Oncology

## 2018-04-11 VITALS — BP 117/69 | HR 63 | Temp 97.5°F | Resp 18 | Ht 63.0 in | Wt 262.1 lb

## 2018-04-11 DIAGNOSIS — R5382 Chronic fatigue, unspecified: Secondary | ICD-10-CM

## 2018-04-11 DIAGNOSIS — Z923 Personal history of irradiation: Secondary | ICD-10-CM | POA: Insufficient documentation

## 2018-04-11 DIAGNOSIS — C50412 Malignant neoplasm of upper-outer quadrant of left female breast: Secondary | ICD-10-CM

## 2018-04-11 DIAGNOSIS — F419 Anxiety disorder, unspecified: Secondary | ICD-10-CM | POA: Insufficient documentation

## 2018-04-11 DIAGNOSIS — Z170 Estrogen receptor positive status [ER+]: Secondary | ICD-10-CM | POA: Diagnosis not present

## 2018-04-11 DIAGNOSIS — Z853 Personal history of malignant neoplasm of breast: Secondary | ICD-10-CM | POA: Insufficient documentation

## 2018-04-11 DIAGNOSIS — Z90722 Acquired absence of ovaries, bilateral: Secondary | ICD-10-CM | POA: Insufficient documentation

## 2018-04-11 DIAGNOSIS — Z9079 Acquired absence of other genital organ(s): Secondary | ICD-10-CM | POA: Diagnosis not present

## 2018-04-11 DIAGNOSIS — Z90710 Acquired absence of both cervix and uterus: Secondary | ICD-10-CM | POA: Insufficient documentation

## 2018-04-11 DIAGNOSIS — Z79899 Other long term (current) drug therapy: Secondary | ICD-10-CM | POA: Diagnosis not present

## 2018-04-11 LAB — CBC WITH DIFFERENTIAL (CANCER CENTER ONLY)
ABS IMMATURE GRANULOCYTES: 0.01 10*3/uL (ref 0.00–0.07)
BASOS PCT: 1 %
Basophils Absolute: 0 10*3/uL (ref 0.0–0.1)
Eosinophils Absolute: 0.1 10*3/uL (ref 0.0–0.5)
Eosinophils Relative: 2 %
HCT: 40.8 % (ref 36.0–46.0)
HEMOGLOBIN: 13.1 g/dL (ref 12.0–15.0)
IMMATURE GRANULOCYTES: 0 %
LYMPHS ABS: 1 10*3/uL (ref 0.7–4.0)
LYMPHS PCT: 18 %
MCH: 26.7 pg (ref 26.0–34.0)
MCHC: 32.1 g/dL (ref 30.0–36.0)
MCV: 83.1 fL (ref 80.0–100.0)
MONOS PCT: 8 %
Monocytes Absolute: 0.5 10*3/uL (ref 0.1–1.0)
NEUTROS ABS: 4.1 10*3/uL (ref 1.7–7.7)
NEUTROS PCT: 71 %
PLATELETS: 276 10*3/uL (ref 150–400)
RBC: 4.91 MIL/uL (ref 3.87–5.11)
RDW: 15 % (ref 11.5–15.5)
WBC Count: 5.8 10*3/uL (ref 4.0–10.5)
nRBC: 0 % (ref 0.0–0.2)

## 2018-04-11 LAB — CMP (CANCER CENTER ONLY)
ALBUMIN: 3.6 g/dL (ref 3.5–5.0)
ALK PHOS: 94 U/L (ref 38–126)
ALT: 23 U/L (ref 0–44)
AST: 21 U/L (ref 15–41)
Anion gap: 8 (ref 5–15)
BILIRUBIN TOTAL: 0.4 mg/dL (ref 0.3–1.2)
BUN: 11 mg/dL (ref 6–20)
CALCIUM: 9.5 mg/dL (ref 8.9–10.3)
CO2: 28 mmol/L (ref 22–32)
CREATININE: 0.89 mg/dL (ref 0.44–1.00)
Chloride: 105 mmol/L (ref 98–111)
GFR, Est AFR Am: >60 mL/min (ref >60)
Glucose, Bld: 82 mg/dL (ref 70–99)
POTASSIUM: 3.6 mmol/L (ref 3.5–5.1)
Sodium: 141 mmol/L (ref 135–145)
TOTAL PROTEIN: 7.2 g/dL (ref 6.5–8.1)

## 2018-04-11 MED ORDER — NAPROXEN SODIUM 220 MG PO TABS: 220.0000 mg | ORAL_TABLET | Freq: Two times a day (BID) | ORAL | Status: DC | PRN

## 2018-04-11 NOTE — Progress Notes (Signed)
Patient Care Team: Patient, No Pcp Per as PCP - General (General Practice) Alphonsa Overall, MD as Consulting Physician (General Surgery) Nicholas Lose, MD as Consulting Physician (Hematology and Oncology) Thea Silversmith, MD as Consulting Physician (Radiation Oncology) Rockwell Germany, RN as Registered Nurse Mauro Kaufmann, RN as Registered Nurse Holley Bouche, NP (Inactive) as Nurse Practitioner (Nurse Practitioner)  DIAGNOSIS:  Encounter Diagnoses  Name Primary?  . Malignant neoplasm of upper-outer quadrant of left breast in female, estrogen receptor positive ([place])   . Chronic fatigue Yes    SUMMARY OF ONCOLOGIC HISTORY:   Breast cancer of upper-outer quadrant of left female breast ([place])   07/14/2014 Mammogram    Left breast UOQ 2:00 position: 1.4 x 1.1 x 1.2 cm mass with indistinct, spiculated margins. Right breast negative.    07/14/2014 Breast US    1.5 cm hypoechoic, irregular mass with spiculated margins UOQ left breast at 2:00, 15 cm from nipple. Intramammary LN 1.4 cm superior to the mass (thin cortex) with another IM LN 3 cm inferior to the index mass with mild focal cortical thickening     07/14/2014 Initial Biopsy    Left breast needle core biopsy (UOQ, 2:00 position): IDC with DCIS; grade 2, ER 100%, PR 96%, HER-2 negative ratio 1.17, Ki-67 17%;  Intramammary LN needle core biopsy (outer left breast, posterior 3:00 position): negative for malignancy.    07/24/2014 Breast MRI    Left breast: 1.4 x 1.4 x 1.3 cm enhancing mass with irregular borders at 3 o'clock position. Right breast with no abnormal enhancement. No abnormal appearing LNs.     07/29/2014 Procedure    Genetic counseling/testing: Breast/Ovarian cancer panel ([facility]) shows no clinically significant variants in ATM, BARD1, BRCA1, BRCA2, BRIP1, CDH1, CHEK2, EPCAM, FANCC, MLH1, MSH2, MSH6, NBN, PALB2, PMS2, PTEN, RAD51C, RAD51D, STK11, TP53, and XRCC2.      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 Negative margins. ER+ (100%), PR+ (96%), HER-2 repeated and negative by FISH.  Ki-67 17%.    08/06/2014 Clinical Stage    Stage IA: T1c, N0 M0    08/06/2014 Oncotype testing    Recurrence score: 7 (6% ROR).  No chemo (Jeanet Lupe).     09/16/2014 - 11/09/2014 Radiation Therapy    Adjuvant RT completed Pablo Ledger). Left breast/ 50.4 Gy at 1.8 Gy per fraction x 28 fractions.  Left breast boost/ 10 Gy at 2 Gy per fraction x 5 fractions    11/25/2014 - 01/08/2015 Anti-estrogen oral therapy    Anastrozole 1 mg daily stopped due to headaches     CHIEF COMPLIANT: Surveillance of breast cancer  INTERVAL HISTORY: [name] is a 42 year old with above-mentioned history of left breast cancer treated with lumpectomy followed by radiation and could not take anastrozole so she stopped it.  She is currently in surveillance.  She complains of fatigue.  She does not have a primary care physician.  Intermittent pains in the breast  REVIEW OF SYSTEMS:   Constitutional: Denies fevers, chills or abnormal weight loss Eyes: Denies blurriness of vision Ears, nose, mouth, throat, and face: Denies mucositis or sore throat Respiratory: Denies cough, dyspnea or wheezes Cardiovascular: Denies palpitation, chest discomfort Gastrointestinal:  Denies nausea, heartburn or change in bowel habits Skin: Denies abnormal skin rashes Lymphatics: Denies new lymphadenopathy or easy bruising Neurological:Denies numbness, tingling or new weaknesses Behavioral/Psych: Mood is stable, no new  changes  Extremities: No lower extremity edema Breast: Occasional pain in the left breast All other systems were reviewed with the patient and are negative.  I have reviewed the past medical history, past surgical history, social history and family history with the patient and they are unchanged from previous  note.  ALLERGIES:  has No Known Allergies.  MEDICATIONS:  Current Outpatient Medications  Medication Sig Dispense Refill  . acetaminophen (TYLENOL) 500 MG tablet Take 1,000 mg by mouth every 6 (six) hours as needed for mild pain.    . naproxen sodium (ALEVE) 220 MG tablet Take 1 tablet (220 mg total) by mouth 2 (two) times daily as needed (pain).     No current facility-administered medications for this visit.     PHYSICAL EXAMINATION: ECOG PERFORMANCE STATUS: 1 - Symptomatic but completely ambulatory  Vitals:   04/11/18 1154  BP: 117/69  Pulse: 63  Resp: 18  Temp: (!) 97.5 F (36.4 C)  SpO2: 100%   Filed Weights   04/11/18 1154  Weight: 262 lb 1.6 oz (118.9 kg)    GENERAL:alert, no distress and comfortable SKIN: skin color, texture, turgor are normal, no rashes or significant lesions EYES: normal, Conjunctiva are pink and non-injected, sclera clear OROPHARYNX:no exudate, no erythema and lips, buccal mucosa, and tongue normal  NECK: supple, thyroid normal size, non-tender, without nodularity LYMPH:  no palpable lymphadenopathy in the cervical, axillary or inguinal LUNGS: clear to auscultation and percussion with normal breathing effort HEART: regular rate & rhythm and no murmurs and no lower extremity edema ABDOMEN:abdomen soft, non-tender and normal bowel sounds MUSCULOSKELETAL:no cyanosis of digits and no clubbing  NEURO: alert & oriented x 3 with fluent speech, no focal motor/sensory deficits EXTREMITIES: No lower extremity edema BREAST: No palpable masses or nodules in either right or left breasts. No palpable axillary supraclavicular or infraclavicular adenopathy no breast tenderness or nipple discharge. (exam performed in the presence of a chaperone)  LABORATORY DATA:  I have reviewed the data as listed CMP Latest Ref Rng & Units 12/16/2017 05/04/2017 11/08/2016  Glucose 65 - 99 mg/dL 106(H) 132(H) 105(H)  BUN 6 - 20 mg/dL 16 13 8   Creatinine 0.44 - 1.00 mg/dL 0.91  1.13(H) 0.86  Sodium 135 - 145 mmol/L 140 137 139  Potassium 3.5 - 5.1 mmol/L 3.9 4.1 3.6  Chloride 101 - 111 mmol/L 106 104 111  CO2 22 - 32 mmol/L 27 23 21(L)  Calcium 8.9 - 10.3 mg/dL 9.2 9.3 7.5(L)  Total Protein 6.5 - 8.1 g/dL - 8.5(H) 5.2(L)  Total Bilirubin 0.3 - 1.2 mg/dL - 0.7 0.4  Alkaline Phos 38 - 126 U/L - 97 63  AST 15 - 41 U/L - 28 14(L)  ALT 14 - 54 U/L - 22 13(L)    Lab Results  Component Value Date   WBC 6.1 12/16/2017   HGB 13.1 12/16/2017   HCT 40.1 12/16/2017   MCV 82.5 12/16/2017   PLT 305 12/16/2017   NEUTROABS 4.1 12/16/2017    ASSESSMENT & PLAN:  Breast cancer of upper-outer quadrant of left female breast ([place]) Left breast lumpectomy: Invasive ductal carcinoma with DCIS, no LVI, 2 sentinel nodes negative, 1.3 cm, grade 1, 100%, PR 96 wasn't, HER-2 negative Ki-67 17% T1 cN0 M0 stage IA Oncotype DX recurrence score is 7, 6% ROR status post radiation completed May 2016, patient had hysterectomy with bilateral salpingo-oophorectomy 2011) Anastrozole 1 mg daily started June 2016 stopped July 2016 due to severe headaches  Current treatment: Unable to  tolerate antiestrogen therapy. Severe anxiety on Xanax Severe fatigue which is chronic  Breast cancer surveillance: 1.  Breast exam 04/11/2018: Benign 2. mammogram 08/29/2017: No evidence of malignancy breast density category B  Return to clinic in 1 year for follow-up    Orders Placed This Encounter  Procedures  . CBC with Differential (Cancer Center Only)    Standing Status:   Future    Standing Expiration Date:   04/12/2019  . CMP ([facility] only)    Standing Status:   Future    Standing Expiration Date:   04/12/2019  . Thyroid Panel With TSH    Standing Status:   Future    Standing Expiration Date:   04/11/2019   The patient has a good understanding of the overall plan. she agrees with it. she will call with any problems that may develop before the next visit here.   Harriette Ohara,  MD 04/11/18

## 2018-04-11 NOTE — Assessment & Plan Note (Signed)
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 Severe anxiety on Xanax Severe fatigue which is chronic  Skin rash: highly suspicious for shingles.  Valtrex 1000 last by mouth twice a day 10 days  Breast cancer surveillance: 1.  Breast exam 04/11/2018: Benign 2. mammogram 08/29/2017: No evidence of malignancy breast density category B  Return to clinic in 1 year for follow-up

## 2018-04-11 NOTE — Telephone Encounter (Signed)
Gave avs and calendar ° °

## 2018-04-12 LAB — THYROID PANEL WITH TSH
Free Thyroxine Index: 2.4 (ref 1.2–4.9)
T3 Uptake Ratio: 26 % (ref 24–39)
T4, Total: 9.4 ug/dL (ref 4.5–12.0)
TSH: 2.43 u[IU]/mL (ref 0.450–4.500)

## 2018-05-02 ENCOUNTER — Telehealth: Payer: Self-pay

## 2018-05-02 NOTE — Telephone Encounter (Signed)
Returned patient's call.  Patient requesting results of recent lab work.  Nurse informed patient on normal lab values.  No further needs at this time.

## 2018-06-08 ENCOUNTER — Emergency Department (HOSPITAL_COMMUNITY)
Admission: EM | Admit: 2018-06-08 | Discharge: 2018-06-08 | Disposition: A | Discharge status: Home / Self Care | Payer: PRIVATE HEALTH INSURANCE | Attending: Emergency Medicine | Admitting: Emergency Medicine

## 2018-06-08 ENCOUNTER — Encounter (HOSPITAL_COMMUNITY): Payer: Self-pay

## 2018-06-08 DIAGNOSIS — J45909 Unspecified asthma, uncomplicated: Secondary | ICD-10-CM | POA: Insufficient documentation

## 2018-06-08 DIAGNOSIS — J101 Influenza due to other identified influenza virus with other respiratory manifestations: Secondary | ICD-10-CM | POA: Insufficient documentation

## 2018-06-08 DIAGNOSIS — R6889 Other general symptoms and signs: Secondary | ICD-10-CM

## 2018-06-08 DIAGNOSIS — R05 Cough: Secondary | ICD-10-CM | POA: Diagnosis present

## 2018-06-08 MED ORDER — ALBUTEROL SULFATE HFA 108 (90 BASE) MCG/ACT IN AERS: 1.0000 | INHALATION_SPRAY | Freq: Four times a day (QID) | RESPIRATORY_TRACT | 0 refills | Status: DC | PRN

## 2018-06-08 MED ORDER — IBUPROFEN 800 MG PO TABS: 800.0000 mg | ORAL_TABLET | Freq: Three times a day (TID) | ORAL | 0 refills | Status: DC | PRN

## 2018-06-08 MED ORDER — OSELTAMIVIR PHOSPHATE 75 MG PO CAPS: 75.0000 mg | ORAL_CAPSULE | Freq: Two times a day (BID) | ORAL | 0 refills | Status: AC

## 2018-06-08 MED ORDER — FLUTICASONE PROPIONATE 50 MCG/ACT NA SUSP: 2.0000 | Freq: Every day | NASAL | 2 refills | Status: DC

## 2018-06-08 MED ORDER — BENZONATATE 100 MG PO CAPS: 100.0000 mg | ORAL_CAPSULE | Freq: Three times a day (TID) | ORAL | 0 refills | Status: DC | PRN

## 2018-06-08 NOTE — ED Provider Notes (Signed)
Emergency Department Provider Note   I have reviewed the triage vital signs and the nursing notes.   HISTORY  Chief Complaint Illness   HPI [name] is a 42 y.o. female with PMH of asthma and seizure disorder resents to the emergency department for evaluation of flulike symptoms.  Symptoms began 36 hours ago and have been gradually worsening.  She describes cough, body aches, sore throat, headache.  She is having chest discomfort with coughing only.  No productive cough or hemoptysis.  No significant shortness of breath.  She is feeling generalized fatigue.  No radiation of symptoms or modifying factors.  She did have a family member who was diagnosed with flu last week.   Past Medical History:  Diagnosis Date  . Asthma    no current med.  . Breast cancer of upper-outer quadrant of left female breast ([place]) 07/16/2014  . Heart murmur    as a child  . History of seizures    during pregnancies; last seizure 3 yrs. ago  . Personal history of radiation therapy 2016  . Seizures ([facility])    no medications  . Stress headaches     Patient Active Problem List   Diagnosis Date Noted  . Seizure ([place]) 11/07/2016  . Left lower lobe pneumonia ([place]) 11/07/2016  . Right flank pain 11/07/2016  . Anxiety 07/05/2015  . Genetic testing 08/18/2014  . Breast cancer of upper-outer quadrant of left female breast ([place]) 07/16/2014  . 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 12/20/2009  . SEIZURE DISORDER 07/07/2009  . DEPRESSION, RECURRENT 08/24/2008  . HYPERSOMNIA, IDIOPATHIC 08/24/2008  . 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 ABDOMINAL HYSTERECTOMY    . BREAST BIOPSY Left 07/14/2014   x2  . BREAST BIOPSY Left 08/21/2016  . BREAST LUMPECTOMY Left 08/06/2014  . BREAST SURGERY     lumpectomy and lymph nodes  . CESAREAN SECTION  [id number]  . CESAREAN SECTION WITH BILATERAL TUBAL LIGATION  03/03/2003  . CYSTO   12/20/2009  . HYSTEROSCOPY W/ ENDOMETRIAL ABLATION  11/11/2007  . LYSIS OF ADHESION  12/20/2009  . TOTAL ABDOMINAL HYSTERECTOMY W/ BILATERAL SALPINGOOPHORECTOMY  12/20/2009  . TYMPANOSTOMY TUBE PLACEMENT     Allergies Patient has no known allergies.  Family History  Problem Relation Age of Onset  . Hypertension Mother   . Breast cancer Other        MGM's sister  . Breast cancer Maternal Grandmother        dx in her 60s    Social History Social History   Tobacco Use  . Smoking status: Never Smoker  . Smokeless tobacco: Never Used  Substance Use Topics  . Alcohol use: Yes    Comment: occ  . Drug use: No    Review of Systems  Constitutional: Positive fever/chills and body aches.  Eyes: No visual changes. ENT: Positive sore throat. Cardiovascular: Positive CP with coughing only.  Respiratory: Denies shortness of breath. Positive cough.  Gastrointestinal: No abdominal pain.  No nausea, no vomiting.  No diarrhea.  No constipation. Genitourinary: Negative for dysuria. Musculoskeletal: Negative for back pain. Skin: Negative for rash. Neurological: Negative for headaches, focal weakness or numbness.  10-point ROS otherwise negative.  ____________________________________________   PHYSICAL EXAM:  VITAL SIGNS: ED Triage Vitals  Enc Vitals Group     BP 06/08/18 1332 128/68     Pulse Rate 06/08/18 1332 81     Resp 06/08/18 1332 18  Temp 06/08/18 1332 98.1 F (36.7 C)     Temp Source 06/08/18 1332 Oral     SpO2 06/08/18 1332 96 %     Weight 06/08/18 1325 205 lb (93 kg)     Height 06/08/18 1325 5\' 3"  (1.6 m)     Pain Score 06/08/18 1325 8   Constitutional: Alert and oriented. Well appearing and in no acute distress. Eyes: Conjunctivae are normal.  Head: Atraumatic. Nose: Positive congestion/rhinnorhea. Mouth/Throat: Mucous membranes are moist.  Oropharynx with erythema. No exudate.  Neck: No stridor.   Cardiovascular: Normal rate, regular rhythm. Good peripheral  circulation. Grossly normal heart sounds.   Respiratory: Normal respiratory effort.  No retractions. Lungs CTAB. Gastrointestinal: Soft and nontender. No distention.  Musculoskeletal: No lower extremity tenderness nor edema. No gross deformities of extremities. Neurologic:  Normal speech and language. No gross focal neurologic deficits are appreciated.  Skin:  Skin is warm, dry and intact. No rash noted.  ____________________________________________  RADIOLOGY  None ____________________________________________   PROCEDURES  Procedure(s) performed:   Procedures  None ____________________________________________   INITIAL IMPRESSION / ASSESSMENT AND PLAN / ED COURSE  Pertinent labs & imaging results that were available during my care of the patient were reviewed by me and considered in my medical decision making (see chart for details).  Patient presents to the emergency department for evaluation of flulike symptoms.  She has had 36 hours of symptoms.  No hypoxemia, or trouble breathing, or chest pain without coughing.  No findings on exam or vitals to suspect acute pneumonia.  I discussed Tamiflu with the patient including the potential risks but also benefits.  Patient is interested in taking Tamiflu.  Plan for symptom management medication and close PCP follow-up.  Discussed ED return precautions in detail.   ____________________________________________  FINAL CLINICAL IMPRESSION(S) / ED DIAGNOSES  Final diagnoses:  Flu-like symptoms    NEW OUTPATIENT MEDICATIONS STARTED DURING THIS VISIT:  New Prescriptions   ALBUTEROL (PROVENTIL HFA;VENTOLIN HFA) 108 (90 BASE) MCG/ACT INHALER    Inhale 1-2 puffs into the lungs every 6 (six) hours as needed for wheezing or shortness of breath.   BENZONATATE (TESSALON) 100 MG CAPSULE    Take 1 capsule (100 mg total) by mouth 3 (three) times daily as needed for cough.   FLUTICASONE (FLONASE) 50 MCG/ACT NASAL SPRAY    Place 2 sprays into  both nostrils daily for 7 days.   IBUPROFEN (ADVIL,MOTRIN) 800 MG TABLET    Take 1 tablet (800 mg total) by mouth every 8 (eight) hours as needed for fever, headache, moderate pain or cramping.   OSELTAMIVIR (TAMIFLU) 75 MG CAPSULE    Take 1 capsule (75 mg total) by mouth every 12 (twelve) hours for 5 days.    Note:  This document was prepared using Dragon voice recognition software and may include unintentional dictation errors.  Nanda Quinton, MD Emergency Medicine    [place], [facility], MD 06/08/18 [phone]

## 2018-06-08 NOTE — ED Triage Notes (Signed)
Pt reports flu like symptoms worsening over the last 2 days

## 2018-06-08 NOTE — Discharge Instructions (Signed)

## 2018-06-08 NOTE — ED Notes (Signed)
Pt stable, ambulatory, states understanding of discharge instructions 

## 2018-07-17 ENCOUNTER — Emergency Department (HOSPITAL_COMMUNITY): Payer: PRIVATE HEALTH INSURANCE

## 2018-07-17 ENCOUNTER — Other Ambulatory Visit: Payer: Self-pay

## 2018-07-17 ENCOUNTER — Encounter (HOSPITAL_COMMUNITY): Payer: Self-pay

## 2018-07-17 ENCOUNTER — Emergency Department (HOSPITAL_COMMUNITY)
Admission: EM | Admit: 2018-07-17 | Discharge: 2018-07-17 | Disposition: A | Discharge status: Home / Self Care | Payer: PRIVATE HEALTH INSURANCE | Attending: Emergency Medicine | Admitting: Emergency Medicine

## 2018-07-17 DIAGNOSIS — J45909 Unspecified asthma, uncomplicated: Secondary | ICD-10-CM | POA: Insufficient documentation

## 2018-07-17 DIAGNOSIS — R05 Cough: Secondary | ICD-10-CM | POA: Diagnosis present

## 2018-07-17 DIAGNOSIS — Z853 Personal history of malignant neoplasm of breast: Secondary | ICD-10-CM | POA: Insufficient documentation

## 2018-07-17 DIAGNOSIS — F329 Major depressive disorder, single episode, unspecified: Secondary | ICD-10-CM | POA: Diagnosis not present

## 2018-07-17 DIAGNOSIS — J069 Acute upper respiratory infection, unspecified: Secondary | ICD-10-CM | POA: Insufficient documentation

## 2018-07-17 DIAGNOSIS — B9789 Other viral agents as the cause of diseases classified elsewhere: Secondary | ICD-10-CM

## 2018-07-17 DIAGNOSIS — F419 Anxiety disorder, unspecified: Secondary | ICD-10-CM | POA: Insufficient documentation

## 2018-07-17 LAB — COMPREHENSIVE METABOLIC PANEL
ALT: 23 U/L (ref 0–44)
AST: 23 U/L (ref 15–41)
Albumin: 3.8 g/dL (ref 3.5–5.0)
Alkaline Phosphatase: 71 U/L (ref 38–126)
Anion gap: 7 (ref 5–15)
BUN: 15 mg/dL (ref 6–20)
CO2: 25 mmol/L (ref 22–32)
Calcium: 8.9 mg/dL (ref 8.9–10.3)
Chloride: 105 mmol/L (ref 98–111)
Creatinine, Ser: 0.9 mg/dL (ref 0.44–1.00)
GFR calc Af Amer: >60 mL/min (ref >60)
GFR calc non Af Amer: >60 mL/min (ref >60)
GLUCOSE: 109 mg/dL — AB (ref 70–99)
Potassium: 3.8 mmol/L (ref 3.5–5.1)
Sodium: 137 mmol/L (ref 135–145)
Total Bilirubin: 0.5 mg/dL (ref 0.3–1.2)
Total Protein: 7.1 g/dL (ref 6.5–8.1)

## 2018-07-17 LAB — URINALYSIS, ROUTINE W REFLEX MICROSCOPIC
Bilirubin Urine: NEGATIVE
Glucose, UA: NEGATIVE mg/dL
Hgb urine dipstick: NEGATIVE
Ketones, ur: NEGATIVE mg/dL
Leukocytes, UA: NEGATIVE
Nitrite: NEGATIVE
PH: 7 (ref 5.0–8.0)
Protein, ur: NEGATIVE mg/dL
Specific Gravity, Urine: 1.006 (ref 1.005–1.030)

## 2018-07-17 LAB — CBC
HEMATOCRIT: 39.8 % (ref 36.0–46.0)
Hemoglobin: 12.3 g/dL (ref 12.0–15.0)
MCH: 26.1 pg (ref 26.0–34.0)
MCHC: 30.9 g/dL (ref 30.0–36.0)
MCV: 84.5 fL (ref 80.0–100.0)
Platelets: 293 10*3/uL (ref 150–400)
RBC: 4.71 MIL/uL (ref 3.87–5.11)
RDW: 15.6 % — ABNORMAL HIGH (ref 11.5–15.5)
WBC: 4.2 10*3/uL (ref 4.0–10.5)
nRBC: 0 % (ref 0.0–0.2)

## 2018-07-17 LAB — I-STAT BETA HCG BLOOD, ED (MC, WL, AP ONLY): I-stat hCG, quantitative: <5 m[IU]/mL (ref <5)

## 2018-07-17 LAB — LIPASE, BLOOD: LIPASE: 28 U/L (ref 11–51)

## 2018-07-17 MED ORDER — HYDROCODONE-HOMATROPINE 5-1.5 MG/5ML PO SYRP: 5.0000 mL | ORAL_SOLUTION | Freq: Four times a day (QID) | ORAL | 0 refills | Status: DC | PRN

## 2018-07-17 MED ORDER — ALBUTEROL SULFATE HFA 108 (90 BASE) MCG/ACT IN AERS
2.0000 | INHALATION_SPRAY | Freq: Once | RESPIRATORY_TRACT | Status: AC
  Administered 2018-07-17: 2 via RESPIRATORY_TRACT
  Filled 2018-07-17: qty 6.7

## 2018-07-17 MED ORDER — AEROCHAMBER PLUS FLO-VU MEDIUM MISC
1.0000 | Freq: Once | Status: AC
  Administered 2018-07-17: 1
  Filled 2018-07-17: qty 1

## 2018-07-17 MED ORDER — SODIUM CHLORIDE 0.9% FLUSH: 3.0000 mL | Freq: Once | INTRAVENOUS | Status: DC

## 2018-07-17 NOTE — ED Provider Notes (Signed)
[facility] DEPT Provider Note   CSN: [id number] Arrival date & time: 07/17/18  1823     History   Chief Complaint Chief Complaint  Patient presents with  . Abdominal Pain  . Cough  . Generalized Body Aches    HPI [name] is a 43 y.o. female with past medical history of asthma, seizure disorder, presenting to the emergency department with complaint of URI symptoms began yesterday.  Patient states she had an influenza-like illness about 3 weeks ago.  Was seen in the ED, treated with symptomatic management and Tamiflu.  She states the Tessalon somewhat helped her cough, and she finished Tamiflu course.  States she felt better in the interim, however had a lingering dry cough.  Yesterday she began feeling ill again with body aches and worsening cough.  Cough is productive of a clear sputum.  She has soreness to her chest and abdomen from the persistent coughing.  Her throat is also sore.  No fevers.  No nausea, vomiting, diarrhea, difficulty breathing or swallowing.  Treating with over-the-counter cold medications and Aleve.  Does not currently have a PCP, however is awaiting her initial new patient appointment next month.  The history is provided by the patient and medical records.    Past Medical History:  Diagnosis Date  . Asthma    no current med.  . Breast cancer of upper-outer quadrant of left female breast ([place]) 07/16/2014  . Heart murmur    as a child  . History of seizures    during pregnancies; last seizure 3 yrs. ago  . Personal history of radiation therapy 2016  . Seizures (HCC)    no medications  . Stress headaches     Patient Active Problem List   Diagnosis Date Noted  . Seizure ([place]) 11/07/2016  . Left lower lobe pneumonia ([place]) 11/07/2016  . Right flank pain 11/07/2016  . Anxiety 07/05/2015  . Genetic testing 08/18/2014  . Breast cancer of upper-outer quadrant of left female breast ([place]) 07/16/2014  . Premature  surgical menopause on hormone replacement therapy 12/20/2009  . SEIZURE DISORDER 07/07/2009  . DEPRESSION, RECURRENT 08/24/2008  . HYPERSOMNIA, IDIOPATHIC 08/24/2008  . 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 ABDOMINAL HYSTERECTOMY    . BREAST BIOPSY Left 07/14/2014   x2  . BREAST BIOPSY Left 08/21/2016  . BREAST LUMPECTOMY Left 08/06/2014  . BREAST SURGERY     lumpectomy and lymph nodes  . CESAREAN SECTION  [id number]  . CESAREAN SECTION WITH BILATERAL TUBAL LIGATION  03/03/2003  . CYSTO  12/20/2009  . HYSTEROSCOPY W/ ENDOMETRIAL ABLATION  11/11/2007  . LYSIS OF ADHESION  12/20/2009  . TOTAL ABDOMINAL HYSTERECTOMY W/ BILATERAL SALPINGOOPHORECTOMY  12/20/2009  . TYMPANOSTOMY TUBE PLACEMENT       OB History   No obstetric history on file.      Home Medications    Prior to Admission medications   Medication Sig Start Date End Date Taking? Authorizing Provider  acetaminophen (TYLENOL) 500 MG tablet Take 1,000 mg by mouth every 6 (six) hours as needed for mild pain.   Yes [provider]  naproxen sodium (ALEVE) 220 MG tablet Take 220 mg by mouth 2 (two) times daily as needed (pain).   Yes [provider]  albuterol (PROVENTIL HFA;VENTOLIN HFA) 108 (90 Base) MCG/ACT inhaler Inhale 1-2 puffs into the lungs every 6 (six) hours as needed for wheezing or shortness of breath. 06/08/18  Long, Wonda Olds, MD  benzonatate (TESSALON) 100 MG capsule Take 1 capsule (100 mg total) by mouth 3 (three) times daily as needed for cough. Patient not taking: Reported on 07/17/2018 06/08/18   Long, Wonda Olds, MD  fluticasone [facility]) 50 MCG/ACT nasal spray Place 2 sprays into both nostrils daily for 7 days. 06/08/18 06/15/18  Long, Wonda Olds, MD  HYDROcodone-homatropine [facility]) 5-1.5 MG/5ML syrup Take 5 mLs by mouth every 6 (six) hours as needed for cough. 07/17/18   Robinson, [place] N, PA-C  ibuprofen (ADVIL,MOTRIN) 800 MG tablet Take 1  tablet (800 mg total) by mouth every 8 (eight) hours as needed for fever, headache, moderate pain or cramping. Patient not taking: Reported on 07/17/2018 06/08/18   Long, Wonda Olds, MD  naproxen sodium (ALEVE) 220 MG tablet Take 1 tablet (220 mg total) by mouth 2 (two) times daily as needed (pain). Patient not taking: Reported on 07/17/2018 04/11/18   Nicholas Lose, MD    Family History Family History  Problem Relation Age of Onset  . Hypertension Mother   . Breast cancer Other        MGM's sister  . Breast cancer Maternal Grandmother        dx in her 40s    Social History Social History   Tobacco Use  . Smoking status: Never Smoker  . Smokeless tobacco: Never Used  Substance Use Topics  . Alcohol use: Yes    Comment: occ  . Drug use: No     Allergies   Patient has no known allergies.   Review of Systems Review of Systems  Constitutional: Negative for fever.  HENT: Positive for sore throat. Negative for ear pain, trouble swallowing and voice change.   Respiratory: Positive for cough. Negative for shortness of breath.   All other systems reviewed and are negative.    Physical Exam Updated Vital Signs BP 125/83 (BP Location: Left Arm)   Pulse 66   Temp 98.4 F (36.9 C) (Oral)   Resp 20   SpO2 99%   Physical Exam Vitals signs and nursing note reviewed.  Constitutional:      General: She is not in acute distress.    Appearance: She is well-developed. She is not ill-appearing.  HENT:     Head: Normocephalic and atraumatic.     Right Ear: Tympanic membrane and ear canal normal.     Left Ear: Tympanic membrane and ear canal normal.     Mouth/Throat:     Mouth: Mucous membranes are moist.     Pharynx: Posterior oropharyngeal erythema present. No oropharyngeal exudate.     Comments: Tolerating secretions.  Uvula midline.  No trismus. Eyes:     Conjunctiva/sclera: Conjunctivae normal.  Neck:     Musculoskeletal: Normal range of motion and neck supple. No neck  rigidity.  Cardiovascular:     Rate and Rhythm: Normal rate and regular rhythm.  Pulmonary:     Effort: Pulmonary effort is normal.     Breath sounds: Normal breath sounds.  Abdominal:     General: Bowel sounds are normal.     Palpations: Abdomen is soft.     Tenderness: There is no abdominal tenderness. There is no guarding or rebound.  Lymphadenopathy:     Cervical: No cervical adenopathy.  Skin:    General: Skin is warm.  Neurological:     Mental Status: She is alert.  Psychiatric:        Behavior: Behavior normal.      ED  Treatments / Results  Labs (all labs ordered are listed, but only abnormal results are displayed) Labs Reviewed  COMPREHENSIVE METABOLIC PANEL - Abnormal; Notable for the following components:      Result Value   Glucose, Bld 109 (*)    All other components within normal limits  CBC - Abnormal; Notable for the following components:   RDW 15.6 (*)    All other components within normal limits  URINALYSIS, ROUTINE W REFLEX MICROSCOPIC - Abnormal; Notable for the following components:   Color, Urine STRAW (*)    All other components within normal limits  LIPASE, BLOOD  I-STAT BETA HCG BLOOD, ED ([facility], [facility], [facility] ONLY)    EKG None  Radiology Dg Chest 2 View  Result Date: 07/17/2018 CLINICAL DATA:  Cough EXAM: CHEST - 2 VIEW COMPARISON:  05/14/2017 FINDINGS: The heart size and mediastinal contours are within normal limits. Both lungs are clear. The visualized skeletal structures are unremarkable. Mildly low lung volumes. IMPRESSION: No active cardiopulmonary disease. Electronically Signed   By: Donavan Foil M.D.   On: 07/17/2018 22:10    Procedures Procedures (including critical care time)  Medications Ordered in ED Medications  sodium chloride flush (NS) 0.9 % injection 3 mL (has no administration in time range)  albuterol (PROVENTIL HFA;VENTOLIN HFA) 108 (90 Base) MCG/ACT inhaler 2 puff (has no administration in time range)  AEROCHAMBER PLUS FLO-VU  MEDIUM MISC 1 each (has no administration in time range)     Initial Impression / Assessment and Plan / ED Course  I have reviewed the triage vital signs and the nursing notes.  Pertinent labs & imaging results that were available during my care of the patient were reviewed by me and considered in my medical decision making (see chart for details).     Patients symptoms are consistent with URI, likely viral etiology. Afebrile, tolerating secretions.  Lungs clear to auscultation bilaterally. CXR negative for acute infiltrate. TMs normal. [place] new viral illness. Discussed that antibiotics are not indicated for viral infections. Pt will be discharged with symptomatic treatment.  Verbalizes understanding and is agreeable with plan. Pt is hemodynamically stable & in NAD prior to dc.  Discussed results, findings, treatment and follow up. Patient advised of return precautions. Patient verbalized understanding and agreed with plan.  [place] Controlled Substance reporting System queried  Final Clinical Impressions(s) / ED Diagnoses   Final diagnoses:  Viral URI with cough    ED Discharge Orders         Ordered    HYDROcodone-homatropine (HYCODAN) 5-1.5 MG/5ML syrup  Every 6 hours PRN     07/17/18 2229           Robinson, [place] N, PA-C 07/17/18 2231    Charlesetta Shanks, MD 07/18/18 [phone]

## 2018-07-17 NOTE — Discharge Instructions (Signed)
Please read instructions below.  You can take tylenol or ibuprofen as needed for sore throat or fever.  Drink plenty of water.  Use saline nasal spray for congestion. You can take hycodan every 6 hours as needed for cough. Be aware this medication can make you drowsy, do not drive or drink alcohol while taking. Use the albuterol every 4-6 hours as needed for chest tightness. Follow up with your primary care provider as needed.  Return to the ER for inability to swallow liquids, difficulty breathing, or new or worsening symptoms.

## 2018-07-17 NOTE — ED Triage Notes (Signed)
Pt reports cough, sore throat, generalized body aches, and abdominal pain since yesterday. Pt states that she felt same symptoms about 3 weeks ago but they went away and started back yesterday.

## 2018-07-17 NOTE — ED Notes (Signed)
Pt verbalized discharge instructions and follow up care. Verbalized and demonstrated knowledge of inhaler and spacer. Alert and ambulatory

## 2018-07-19 ENCOUNTER — Emergency Department (HOSPITAL_COMMUNITY): Payer: PRIVATE HEALTH INSURANCE

## 2018-07-19 ENCOUNTER — Emergency Department (HOSPITAL_COMMUNITY)
Admission: EM | Admit: 2018-07-19 | Discharge: 2018-07-19 | Disposition: A | Discharge status: Home / Self Care | Payer: PRIVATE HEALTH INSURANCE | Attending: Emergency Medicine | Admitting: Emergency Medicine

## 2018-07-19 DIAGNOSIS — J209 Acute bronchitis, unspecified: Secondary | ICD-10-CM | POA: Insufficient documentation

## 2018-07-19 DIAGNOSIS — J101 Influenza due to other identified influenza virus with other respiratory manifestations: Secondary | ICD-10-CM | POA: Insufficient documentation

## 2018-07-19 DIAGNOSIS — J45909 Unspecified asthma, uncomplicated: Secondary | ICD-10-CM | POA: Diagnosis not present

## 2018-07-19 DIAGNOSIS — Z79899 Other long term (current) drug therapy: Secondary | ICD-10-CM | POA: Insufficient documentation

## 2018-07-19 DIAGNOSIS — J208 Acute bronchitis due to other specified organisms: Secondary | ICD-10-CM

## 2018-07-19 DIAGNOSIS — J111 Influenza due to unidentified influenza virus with other respiratory manifestations: Secondary | ICD-10-CM

## 2018-07-19 DIAGNOSIS — R51 Headache: Secondary | ICD-10-CM | POA: Diagnosis present

## 2018-07-19 DIAGNOSIS — R69 Illness, unspecified: Secondary | ICD-10-CM

## 2018-07-19 LAB — URINALYSIS, ROUTINE W REFLEX MICROSCOPIC
Bilirubin Urine: NEGATIVE
Glucose, UA: NEGATIVE mg/dL
Hgb urine dipstick: NEGATIVE
Ketones, ur: NEGATIVE mg/dL
Leukocytes, UA: NEGATIVE
Nitrite: NEGATIVE
Protein, ur: NEGATIVE mg/dL
Specific Gravity, Urine: 1.014 (ref 1.005–1.030)
pH: 7 (ref 5.0–8.0)

## 2018-07-19 LAB — COMPREHENSIVE METABOLIC PANEL
ALT: 25 U/L (ref 0–44)
AST: 28 U/L (ref 15–41)
Albumin: 3.6 g/dL (ref 3.5–5.0)
Alkaline Phosphatase: 72 U/L (ref 38–126)
Anion gap: 9 (ref 5–15)
BUN: 9 mg/dL (ref 6–20)
CO2: 24 mmol/L (ref 22–32)
Calcium: 9 mg/dL (ref 8.9–10.3)
Chloride: 104 mmol/L (ref 98–111)
Creatinine, Ser: 1.03 mg/dL — ABNORMAL HIGH (ref 0.44–1.00)
GFR calc Af Amer: >60 mL/min (ref >60)
GFR calc non Af Amer: >60 mL/min (ref >60)
Glucose, Bld: 100 mg/dL — ABNORMAL HIGH (ref 70–99)
Potassium: 3.7 mmol/L (ref 3.5–5.1)
Sodium: 137 mmol/L (ref 135–145)
Total Bilirubin: 0.3 mg/dL (ref 0.3–1.2)
Total Protein: 7 g/dL (ref 6.5–8.1)

## 2018-07-19 LAB — CBC
HEMATOCRIT: 41.3 % (ref 36.0–46.0)
Hemoglobin: 12.8 g/dL (ref 12.0–15.0)
MCH: 26 pg (ref 26.0–34.0)
MCHC: 31 g/dL (ref 30.0–36.0)
MCV: 83.8 fL (ref 80.0–100.0)
Platelets: 279 10*3/uL (ref 150–400)
RBC: 4.93 MIL/uL (ref 3.87–5.11)
RDW: 15.4 % (ref 11.5–15.5)
WBC: 3.5 10*3/uL — AB (ref 4.0–10.5)
nRBC: 0 % (ref 0.0–0.2)

## 2018-07-19 LAB — LIPASE, BLOOD: Lipase: 22 U/L (ref 11–51)

## 2018-07-19 LAB — I-STAT BETA HCG BLOOD, ED (MC, WL, AP ONLY): I-stat hCG, quantitative: <5 m[IU]/mL (ref <5)

## 2018-07-19 MED ORDER — ONDANSETRON HCL 4 MG PO TABS: 4.0000 mg | ORAL_TABLET | Freq: Three times a day (TID) | ORAL | 0 refills | Status: DC | PRN

## 2018-07-19 MED ORDER — ALBUTEROL SULFATE HFA 108 (90 BASE) MCG/ACT IN AERS
4.0000 | INHALATION_SPRAY | Freq: Once | RESPIRATORY_TRACT | Status: AC
  Administered 2018-07-19: 4 via RESPIRATORY_TRACT
  Filled 2018-07-19: qty 6.7

## 2018-07-19 MED ORDER — SODIUM CHLORIDE 0.9% FLUSH: 3.0000 mL | Freq: Once | INTRAVENOUS | Status: DC

## 2018-07-19 MED ORDER — DEXAMETHASONE 4 MG PO TABS
10.0000 mg | ORAL_TABLET | Freq: Once | ORAL | Status: AC
  Administered 2018-07-19: 10 mg via ORAL
  Filled 2018-07-19: qty 3

## 2018-07-19 MED ORDER — ONDANSETRON 4 MG PO TBDP
4.0000 mg | ORAL_TABLET | Freq: Once | ORAL | Status: AC | PRN
  Administered 2018-07-19: 4 mg via ORAL
  Filled 2018-07-19: qty 1

## 2018-07-19 NOTE — ED Provider Notes (Signed)
[facility] EMERGENCY DEPARTMENT Provider Note   CSN: [id number] Arrival date & time: 07/19/18  1525     History   Chief Complaint Chief Complaint  Patient presents with  . Emesis  . URI    HPI [name] is a 43 y.o. female.  HPI   [name] is a 43 y.o. female with PMH of asthma, breast cancer status post surgery and radiation, history of seizures, stress headaches who presents from home with about 6 to 7 days of persistent cough which is been really productive of a very small amount of yellow or green sputum.  Largely nonproductive.  She has had subjective fevers and the highest temperature of 100.1 F at home during this time.  Has no history of COPD but does report a history of asthma as a child child with no recurrent exacerbations in the last several years.  She has had some mild chest discomfort in the anterior upper chest only after coughing.  No dyspnea on exertion or chest pain on exertion.  No chest pressure.  She has had some crampy abdominal discomfort in the upper epigastric region which has been stable.  Able to tolerate only small amounts of p.o.  Few episodes of nonbloody nonbilious emesis in the last few days.  Was seen previously about 2 days ago and told she had a viral illness.  Given prescription for albuterol inhaler and has been using it about 2 puffs every 6 hours with some improvement.  Has not been given any steroids.  Does not have a primary care doctor at this time.  Normal bowel movements and normal urination.  No pelvic complaints.  Past Medical History:  Diagnosis Date  . Asthma    no current med.  . Breast cancer of upper-outer quadrant of left female breast ([place]) 07/16/2014  . Heart murmur    as a child  . History of seizures    during pregnancies; last seizure 3 yrs. ago  . Personal history of radiation therapy 2016  . Seizures (HCC)    no medications  . Stress headaches     Patient Active Problem List   Diagnosis Date Noted  . Seizure ([place]) 11/07/2016  . Left lower lobe pneumonia ([place]) 11/07/2016  . Right flank pain 11/07/2016  . Anxiety 07/05/2015  . Genetic testing 08/18/2014  . Breast cancer of upper-outer quadrant of left female breast ([place]) 07/16/2014  . 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 12/20/2009  . SEIZURE DISORDER 07/07/2009  . DEPRESSION, RECURRENT 08/24/2008  . HYPERSOMNIA, IDIOPATHIC 08/24/2008  . 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 ABDOMINAL HYSTERECTOMY    . BREAST BIOPSY Left 07/14/2014   x2  . BREAST BIOPSY Left 08/21/2016  . BREAST LUMPECTOMY Left 08/06/2014  . BREAST SURGERY     lumpectomy and lymph nodes  . CESAREAN SECTION  [id number]  . CESAREAN SECTION WITH BILATERAL TUBAL LIGATION  03/03/2003  . CYSTO  12/20/2009  . HYSTEROSCOPY W/ ENDOMETRIAL ABLATION  11/11/2007  . LYSIS OF ADHESION  12/20/2009  . TOTAL ABDOMINAL HYSTERECTOMY W/ BILATERAL SALPINGOOPHORECTOMY  12/20/2009  . TYMPANOSTOMY TUBE PLACEMENT       OB History   No obstetric history on file.      Home Medications    Prior to Admission medications   Medication Sig Start Date End Date Taking? Authorizing Provider  acetaminophen (TYLENOL) 500 MG tablet Take 1,000 mg by mouth every 6 (  six) hours as needed for mild pain.   Yes [provider]  albuterol (PROVENTIL HFA;VENTOLIN HFA) 108 (90 Base) MCG/ACT inhaler Inhale 1-2 puffs into the lungs every 6 (six) hours as needed for wheezing or shortness of breath. Patient not taking: Reported on 07/19/2018 06/08/18   Long, Wonda Olds, MD  benzonatate (TESSALON) 100 MG capsule Take 1 capsule (100 mg total) by mouth 3 (three) times daily as needed for cough. Patient not taking: Reported on 07/19/2018 06/08/18   Long, Wonda Olds, MD  HYDROcodone-homatropine [facility]) 5-1.5 MG/5ML syrup Take 5 mLs by mouth every 6 (six) hours as needed for cough. Patient not taking: Reported on  07/19/2018 07/17/18   Robinson, [place] N, PA-C  ibuprofen (ADVIL,MOTRIN) 800 MG tablet Take 1 tablet (800 mg total) by mouth every 8 (eight) hours as needed for fever, headache, moderate pain or cramping. Patient not taking: Reported on 07/17/2018 06/08/18   Long, Wonda Olds, MD  naproxen sodium (ALEVE) 220 MG tablet Take 1 tablet (220 mg total) by mouth 2 (two) times daily as needed (pain). Patient not taking: Reported on 07/17/2018 04/11/18   Nicholas Lose, MD  ondansetron (ZOFRAN) 4 MG tablet Take 1 tablet (4 mg total) by mouth every 8 (eight) hours as needed for nausea or vomiting. 07/19/18   Darryl Willner, Rodena Goldmann, MD    Family History Family History  Problem Relation Age of Onset  . Hypertension Mother   . Breast cancer Other        MGM's sister  . Breast cancer Maternal Grandmother        dx in her 70s    Social History Social History   Tobacco Use  . Smoking status: Never Smoker  . Smokeless tobacco: Never Used  Substance Use Topics  . Alcohol use: Yes    Comment: occ  . Drug use: No     Allergies   Patient has no known allergies.   Review of Systems Review of Systems  Constitutional: Positive for fever. Negative for activity change, appetite change and chills.  HENT: Positive for congestion and sore throat. Negative for ear pain and facial swelling.   Eyes: Negative for pain and visual disturbance.  Respiratory: Positive for cough. Negative for shortness of breath.   Cardiovascular: Negative for chest pain and palpitations.  Gastrointestinal: Positive for abdominal pain, nausea and vomiting.  Genitourinary: Negative for dysuria and hematuria.  Musculoskeletal: Positive for myalgias. Negative for arthralgias and back pain.  Skin: Negative for color change and rash.  Neurological: Negative for seizures and syncope.  All other systems reviewed and are negative.    Physical Exam Updated Vital Signs BP (!) 106/56   Pulse 66   Temp 99.6 F (37.6 C) (Oral)   Resp 20    SpO2 98%   Physical Exam Vitals signs and nursing note reviewed.  Constitutional:      General: She is not in acute distress.    Appearance: Normal appearance. She is well-developed. She is not ill-appearing.  HENT:     Head: Normocephalic and atraumatic.     Mouth/Throat:     Lips: Pink.     Mouth: Mucous membranes are moist.     Palate: No mass.     Pharynx: Oropharynx is clear. No oropharyngeal exudate, posterior oropharyngeal erythema or uvula swelling.     Tonsils: No tonsillar exudate or tonsillar abscesses. Swelling: 1+ on the right. 1+ on the left.  Eyes:     Conjunctiva/sclera: Conjunctivae normal.  Neck:  Musculoskeletal: Neck supple.  Cardiovascular:     Rate and Rhythm: Normal rate and regular rhythm.     Heart sounds: No murmur.  Pulmonary:     Effort: Pulmonary effort is normal. No tachypnea, accessory muscle usage or respiratory distress.     Breath sounds: No decreased air movement. Wheezing (Bilateral, only expiratory.  Equal) present. No decreased breath sounds.  Abdominal:     Palpations: Abdomen is soft.     Tenderness: There is no abdominal tenderness. There is no guarding or rebound. Negative signs include Murphy's sign, Rovsing's sign and McBurney's sign.  Skin:    General: Skin is warm and dry.  Neurological:     General: No focal deficit present.     Mental Status: She is alert.     GCS: GCS eye subscore is 4. GCS verbal subscore is 5. GCS motor subscore is 6.     Cranial Nerves: Cranial nerves are intact.     Sensory: Sensation is intact.     Motor: Motor function is intact.     Coordination: Coordination is intact.     Gait: Gait is intact.  Psychiatric:        Behavior: Behavior is cooperative.      ED Treatments / Results  Labs (all labs ordered are listed, but only abnormal results are displayed) Labs Reviewed  COMPREHENSIVE METABOLIC PANEL - Abnormal; Notable for the following components:      Result Value   Glucose, Bld 100 (*)     Creatinine, Ser 1.03 (*)    All other components within normal limits  CBC - Abnormal; Notable for the following components:   WBC 3.5 (*)    All other components within normal limits  LIPASE, BLOOD  URINALYSIS, ROUTINE W REFLEX MICROSCOPIC  I-STAT BETA HCG BLOOD, ED ([facility], [facility], [facility] ONLY)    EKG None  Radiology Dg Chest 2 View  Result Date: 07/19/2018 CLINICAL DATA:  Cough for 1 week.  Asthma. EXAM: CHEST - 2 VIEW COMPARISON:  07/17/2018 FINDINGS: Low lung volumes are present, causing crowding of the pulmonary vasculature. Mild cardiomegaly. No edema. Airway thickening is present, suggesting bronchitis or reactive airways disease. No pleural effusion. IMPRESSION: 1. Airway thickening is present, suggesting bronchitis or reactive airways disease. 2. Mild cardiomegaly. 3. Low lung volumes. Electronically Signed   By: Van Clines M.D.   On: 07/19/2018 19:23   Dg Chest 2 View  Result Date: 07/17/2018 CLINICAL DATA:  Cough EXAM: CHEST - 2 VIEW COMPARISON:  05/14/2017 FINDINGS: The heart size and mediastinal contours are within normal limits. Both lungs are clear. The visualized skeletal structures are unremarkable. Mildly low lung volumes. IMPRESSION: No active cardiopulmonary disease. Electronically Signed   By: Donavan Foil M.D.   On: 07/17/2018 22:10    Procedures Procedures (including critical care time)  Medications Ordered in ED Medications  sodium chloride flush (NS) 0.9 % injection 3 mL (has no administration in time range)  ondansetron (ZOFRAN-ODT) disintegrating tablet 4 mg (4 mg Oral Given 07/19/18 1538)  dexamethasone (DECADRON) tablet 10 mg (10 mg Oral Given 07/19/18 1955)  albuterol (PROVENTIL HFA;VENTOLIN HFA) 108 (90 Base) MCG/ACT inhaler 4 puff (4 puffs Inhalation Given 07/19/18 1955)     Initial Impression / Assessment and Plan / ED Course  I have reviewed the triage vital signs and the nursing notes.  Pertinent labs & imaging results that were available during  my care of the patient were reviewed by me and considered in my medical decision  making (see chart for details).     MDM:  Imaging: Chest x-ray shows airway thickening suggestive of bronchitis or reactive airway disease.  Mild cardiomegaly.  ED Provider Interpretation of EKG: None indicated at this time.    Labs: CBC and CMP normal, lipase normal, beta-hCG negative  On initial evaluation, patient appears well. Afebrile and hemodynamically stable. Alert and oriented x4, pleasant, and cooperative.  Patient presents with cough, wheezing, sputum production, sore throat, abdominal symptoms as detailed above.  On exam, patient has no focal normality with exception of an expiratory wheezing bilaterally.  No increased work of breathing.  Satting well on room air without tachypnea or tachycardia.  Normotensive.  The patient was seen recently and diagnosed with viral illness which I agree with.  Chart review shows patient was diagnosed with viral illness but no influenza was tested at that time.  Given that she has no other high risk features will not test for influenza at this time as she has had symptoms for 7 days.  Chest x-ray repeated as patient is a persistent cough and subjective fevers.  No objective fever here.  Repeat chest x-ray shows no evidence for pneumonia or pneumothorax but does show findings consistent with bronchitis.  Low suspicion for ACS and PE given her presentation and symptoms.  Abdominal exam is normal.  No evidence for DVT on exam.  Suspect patient has viral syndrome and likely viral bronchitis.  No known history of COPD.  Given her wheezing on exam and some improvement with lower doses albuterol at home she was given an additional 4+ albuterol in the ED and a single dose of 10 mg of p.o. Decadron.  She had some improvement after albuterol.  Given the most likely setting of viral bronchitis she was counseled that the symptoms may last up to 21 to 28 days but was counseled extensively  on worsening signs or symptoms consistent with a possible pneumonia or other pathology.  Discharged home with prescription for albuterol counseled to use as needed for possibly 4 hours for the next 48 hours.  She was given permission to follow-up with a PCP as she requires additional outpatient follow-up and possibly additional testing.  At this time she does not require antibiotics or additional intervention.  She was agreeable to this plan.  Given a short course of Zofran as well.  Patient was discharged in stable condition with return precautions.  The plan for this patient was discussed with Dr. Zenia Resides who voiced agreement and who oversaw evaluation and treatment of this patient.   The patient was fully informed and involved with the history taking, evaluation, workup including labs/images, and plan. The patient's concerns and questions were addressed to the patient's satisfaction and she expressed agreement with the plan to DC home.    Final Clinical Impressions(s) / ED Diagnoses   Final diagnoses:  Viral bronchitis  Influenza-like illness    ED Discharge Orders         Ordered    ondansetron (ZOFRAN) 4 MG tablet  Every 8 hours PRN     07/19/18 2011           Aiya Keach, Rodena Goldmann, MD 07/19/18 2012    Lacretia Leigh, MD 07/22/18 1401

## 2018-07-19 NOTE — ED Triage Notes (Signed)
Patient to ED c/o persistent URI symptoms and new N/V and abdominal pain with fever that began today. Seen at [facility] 3 days for URI and given medications for cough/symptoms. Resp e/u.

## 2018-07-19 NOTE — ED Notes (Signed)
Reviewed d/c instructions with pt, who verbalized understanding and had no outstanding questions. Pt departed in NAD, refused use of wheelchair.   

## 2018-07-19 NOTE — Discharge Instructions (Addendum)
Your repeat chest x-ray is most consistent with bronchitis which we spoke about earlier.  Most common cause of bronchitis, and well is in your case, is a viral illness.  You do not need antibiotics for this.    You may use your albuterol inhaler for 4 puffs every 4 hours as needed for the next 48 hours.  Please call the above number and schedule follow-up with a primary care physician.  Use the Zofran (nausea medicine) only as needed.

## 2018-07-19 NOTE — ED Provider Notes (Signed)
I saw and evaluated the patient, reviewed the resident's note and I agree with the findings and plan.  EKG: None 43 year old female presents with productive cough and yellow sputum x6 days.  Had some wheezing here and treated with albuterol.  Appears to have a virus possible bronchitis versus pneumonia.  Chest x-ray is pending.  Patient likely to be discharged   Lacretia Leigh, MD 07/19/18 1918

## 2018-07-19 NOTE — ED Notes (Addendum)
Pt endorses productive cough with yellow sputum x 6 days, went to [facility] 3 days ago and told it was virus. Pt has had vomiting and some abd pain since yesterday. Has hx of asthma and given inhaler at [facility] and "i've already used all of it" States that her asthma has not acted up in a long time. VSS, NAD, Afebrile. Denies pain.

## 2018-07-19 NOTE — ED Notes (Signed)
Patient transported to X-ray 

## 2018-08-05 ENCOUNTER — Other Ambulatory Visit: Payer: Self-pay | Admitting: Hematology and Oncology

## 2018-08-05 DIAGNOSIS — Z9889 Other specified postprocedural states: Secondary | ICD-10-CM

## 2018-08-15 ENCOUNTER — Encounter (HOSPITAL_COMMUNITY): Payer: Self-pay | Admitting: Emergency Medicine

## 2018-08-15 ENCOUNTER — Emergency Department (HOSPITAL_COMMUNITY)
Admission: EM | Admit: 2018-08-15 | Discharge: 2018-08-15 | Disposition: A | Discharge status: Home / Self Care | Payer: PRIVATE HEALTH INSURANCE | Attending: Emergency Medicine | Admitting: Emergency Medicine

## 2018-08-15 ENCOUNTER — Other Ambulatory Visit: Payer: Self-pay

## 2018-08-15 DIAGNOSIS — J45909 Unspecified asthma, uncomplicated: Secondary | ICD-10-CM | POA: Diagnosis not present

## 2018-08-15 DIAGNOSIS — Z79899 Other long term (current) drug therapy: Secondary | ICD-10-CM | POA: Insufficient documentation

## 2018-08-15 DIAGNOSIS — R51 Headache: Secondary | ICD-10-CM | POA: Insufficient documentation

## 2018-08-15 DIAGNOSIS — R519 Headache, unspecified: Secondary | ICD-10-CM

## 2018-08-15 MED ORDER — DIPHENHYDRAMINE HCL 25 MG PO CAPS
25.0000 mg | ORAL_CAPSULE | Freq: Once | ORAL | Status: AC
  Administered 2018-08-15: 25 mg via ORAL
  Filled 2018-08-15: qty 1

## 2018-08-15 MED ORDER — METOCLOPRAMIDE HCL 10 MG PO TABS
5.0000 mg | ORAL_TABLET | Freq: Once | ORAL | Status: AC
  Administered 2018-08-15: 5 mg via ORAL
  Filled 2018-08-15: qty 1

## 2018-08-15 MED ORDER — KETOROLAC TROMETHAMINE 30 MG/ML IJ SOLN
30.0000 mg | Freq: Once | INTRAMUSCULAR | Status: AC
  Administered 2018-08-15: 30 mg via INTRAMUSCULAR
  Filled 2018-08-15: qty 1

## 2018-08-15 NOTE — Discharge Instructions (Addendum)
Please read attached information. If you experience any new or worsening signs or symptoms please return to the emergency room for evaluation. Please follow-up with your primary care provider or specialist as discussed.  °

## 2018-08-15 NOTE — ED Provider Notes (Signed)
[facility] EMERGENCY DEPARTMENT Provider Note   CSN: [id number] Arrival date & time: 08/15/18  1245    History   Chief Complaint Chief Complaint  Patient presents with  . Migraine    HPI [name] is a 43 y.o. female.     HPI   43 year old female presents today with complaints of headache.  She notes 2 days ago she developed a tightness around her head and her neck.  She notes the pain is continued to persist with associated light sensitivity.  She denies any head trauma, fever, neck stiffness, or any neurological deficits.  Patient notes previous history of headaches in the past that felt similar that were not quite as severe in intensity.  She notes taking ibuprofen, Goody powder, Tylenol at home without symptomatic improvement.  She denies any aggravating factors.  She went to urgent care and was referred to the emergency room.  She is not on any anticoagulants.  Past Medical History:  Diagnosis Date  . Asthma    no current med.  . Breast cancer of upper-outer quadrant of left female breast ([place]) 07/16/2014  . Heart murmur    as a child  . History of seizures    during pregnancies; last seizure 3 yrs. ago  . Personal history of radiation therapy 2016  . Seizures ([facility])    no medications  . Stress headaches     Patient Active Problem List   Diagnosis Date Noted  . Seizure ([place]) 11/07/2016  . Left lower lobe pneumonia ([place]) 11/07/2016  . Right flank pain 11/07/2016  . Anxiety 07/05/2015  . Genetic testing 08/18/2014  . Breast cancer of upper-outer quadrant of left female breast ([place]) 07/16/2014  . 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 12/20/2009  . SEIZURE DISORDER 07/07/2009  . DEPRESSION, RECURRENT 08/24/2008  . HYPERSOMNIA, IDIOPATHIC 08/24/2008  . 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 ABDOMINAL HYSTERECTOMY    . BREAST BIOPSY Left 07/14/2014   x2  . BREAST  BIOPSY Left 08/21/2016  . BREAST LUMPECTOMY Left 08/06/2014  . BREAST SURGERY     lumpectomy and lymph nodes  . CESAREAN SECTION  [id number]  . CESAREAN SECTION WITH BILATERAL TUBAL LIGATION  03/03/2003  . CYSTO  12/20/2009  . HYSTEROSCOPY W/ ENDOMETRIAL ABLATION  11/11/2007  . LYSIS OF ADHESION  12/20/2009  . TOTAL ABDOMINAL HYSTERECTOMY W/ BILATERAL SALPINGOOPHORECTOMY  12/20/2009  . TYMPANOSTOMY TUBE PLACEMENT       OB History   No obstetric history on file.      Home Medications    Prior to Admission medications   Medication Sig Start Date End Date Taking? Authorizing Provider  acetaminophen (TYLENOL) 500 MG tablet Take 1,000 mg by mouth every 6 (six) hours as needed for mild pain.    [provider]  albuterol (PROVENTIL HFA;VENTOLIN HFA) 108 (90 Base) MCG/ACT inhaler Inhale 1-2 puffs into the lungs every 6 (six) hours as needed for wheezing or shortness of breath. Patient not taking: Reported on 07/19/2018 06/08/18   Long, Wonda Olds, MD  benzonatate (TESSALON) 100 MG capsule Take 1 capsule (100 mg total) by mouth 3 (three) times daily as needed for cough. Patient not taking: Reported on 07/19/2018 06/08/18   Long, Wonda Olds, MD  HYDROcodone-homatropine [facility]) 5-1.5 MG/5ML syrup Take 5 mLs by mouth every 6 (six) hours as needed for cough. Patient not taking: Reported on 07/19/2018 07/17/18   Robinson, [place] N, PA-C  ibuprofen (ADVIL,MOTRIN) 800 MG tablet Take 1 tablet (800 mg total) by mouth every 8 (eight) hours as needed for fever, headache, moderate pain or cramping. Patient not taking: Reported on 07/17/2018 06/08/18   Long, Wonda Olds, MD  naproxen sodium (ALEVE) 220 MG tablet Take 1 tablet (220 mg total) by mouth 2 (two) times daily as needed (pain). Patient not taking: Reported on 07/17/2018 04/11/18   Nicholas Lose, MD  ondansetron (ZOFRAN) 4 MG tablet Take 1 tablet (4 mg total) by mouth every 8 (eight) hours as needed for nausea or vomiting. 07/19/18   Scheidler, Rodena Goldmann, MD    Family History Family History  Problem Relation Age of Onset  . Hypertension Mother   . Breast cancer Other        MGM's sister  . Breast cancer Maternal Grandmother        dx in her 67s    Social History Social History   Tobacco Use  . Smoking status: Never Smoker  . Smokeless tobacco: Never Used  Substance Use Topics  . Alcohol use: Yes    Comment: occ  . Drug use: No     Allergies   Patient has no known allergies.   Review of Systems Review of Systems  All other systems reviewed and are negative.    Physical Exam Updated Vital Signs BP (!) 109/50   Pulse 67   Temp 98.1 F (36.7 C)   Resp 16   SpO2 99%   Physical Exam Vitals signs and nursing note reviewed.  Constitutional:      General: She is not in acute distress.    Appearance: She is well-developed. She is not diaphoretic.  HENT:     Head: Normocephalic and atraumatic.  Eyes:     General: No scleral icterus.       Right eye: No discharge.        Left eye: No discharge.     Conjunctiva/sclera: Conjunctivae normal.     Pupils: Pupils are equal, round, and reactive to light.  Neck:     Musculoskeletal: Normal range of motion and neck supple.     Vascular: No JVD.     Trachea: No tracheal deviation.  Pulmonary:     Effort: Pulmonary effort is normal.     Breath sounds: No stridor.  Musculoskeletal: Normal range of motion.        General: No tenderness.  Lymphadenopathy:     Cervical: No cervical adenopathy.  Neurological:     Mental Status: She is alert and oriented to person, place, and time.     GCS: GCS eye subscore is 4. GCS verbal subscore is 5. GCS motor subscore is 6.     Cranial Nerves: No cranial nerve deficit.     Sensory: No sensory deficit.     Motor: No tremor, atrophy, abnormal muscle tone or seizure activity.     Coordination: Coordination normal.     Gait: Gait normal.     Deep Tendon Reflexes:     Reflex Scores:      Patellar reflexes are 2+ on the right side  and 2+ on the left side. Psychiatric:        Behavior: Behavior normal.        Thought Content: Thought content normal.        Judgment: Judgment normal.      ED Treatments / Results  Labs (all labs ordered are listed, but only abnormal results are displayed) Labs Reviewed -  No data to display  EKG None  Radiology No results found.  Procedures Procedures (including critical care time)  Medications Ordered in ED Medications  ketorolac (TORADOL) 30 MG/ML injection 30 mg (30 mg Intramuscular Given 08/15/18 1313)  metoCLOPramide (REGLAN) tablet 5 mg (5 mg Oral Given 08/15/18 1313)  diphenhydrAMINE (BENADRYL) capsule 25 mg (25 mg Oral Given 08/15/18 1313)     Initial Impression / Assessment and Plan / ED Course  I have reviewed the triage vital signs and the nursing notes.  Pertinent labs & imaging results that were available during my care of the patient were reviewed by me and considered in my medical decision making (see chart for details).        43 year old female presents today with complaints of headache, she has no acute neurological deficits, no red flags.  Symptoms improved with Toradol Reglan Benadryl.  Discharged with return precautions.  She verbalized understanding and agreement to today's plan had no further questions or concerns.  Final Clinical Impressions(s) / ED Diagnoses   Final diagnoses:  Acute nonintractable headache, unspecified headache type    ED Discharge Orders    None       Francee Gentile 08/15/18 [place], MD 08/15/18 1726

## 2018-08-15 NOTE — ED Triage Notes (Signed)
Pt being sent from UC with c/o migraine headache x2 days. She denies n/v. A/O NAD.

## 2018-08-15 NOTE — ED Notes (Signed)
Pt ambulatory to and from radiology with son, pt tolerated well.

## 2018-09-03 ENCOUNTER — Ambulatory Visit
Admission: RE | Admit: 2018-09-03 | Discharge: 2018-09-03 | Disposition: A | Discharge status: Home / Self Care | Payer: PRIVATE HEALTH INSURANCE | Source: Ambulatory Visit | Attending: Hematology and Oncology | Admitting: Hematology and Oncology

## 2018-09-03 DIAGNOSIS — Z9889 Other specified postprocedural states: Secondary | ICD-10-CM

## 2018-09-06 ENCOUNTER — Encounter: Payer: Self-pay | Admitting: *Deleted

## 2018-09-06 ENCOUNTER — Telehealth: Payer: Self-pay | Admitting: *Deleted

## 2018-09-06 NOTE — Progress Notes (Signed)
Pt came into clinic today requesting a doctors note to return to work following mammogram.  Pt states she is feeling fine and is having no issues. Note given to patient, no other needs at this time.

## 2018-09-06 NOTE — Telephone Encounter (Signed)
Error entry

## 2018-09-09 ENCOUNTER — Telehealth: Payer: Self-pay | Admitting: Hematology and Oncology

## 2018-09-09 NOTE — Telephone Encounter (Signed)
Returned call to patient re appointment. Per patient she needs an appointment sooner that October due to pain/swelling in arm on the side where she has surgery. Patient transferred to desk nurse voicemail. Also this message routed to VG/desk nurse.

## 2018-09-10 NOTE — Telephone Encounter (Signed)
Will reach out to pt regarding arm pain to see if she needs to be seen soon. Thank you.

## 2018-11-12 IMAGING — DX DG CHEST 2V
2 series · 2 of 2 positions shown · non-contrast
Comparison: 07/25/2015

CLINICAL DATA: Severe cough, shortness of breath, vomiting and sore
throat.

EXAM:
CHEST  2 VIEW

[chest pa]
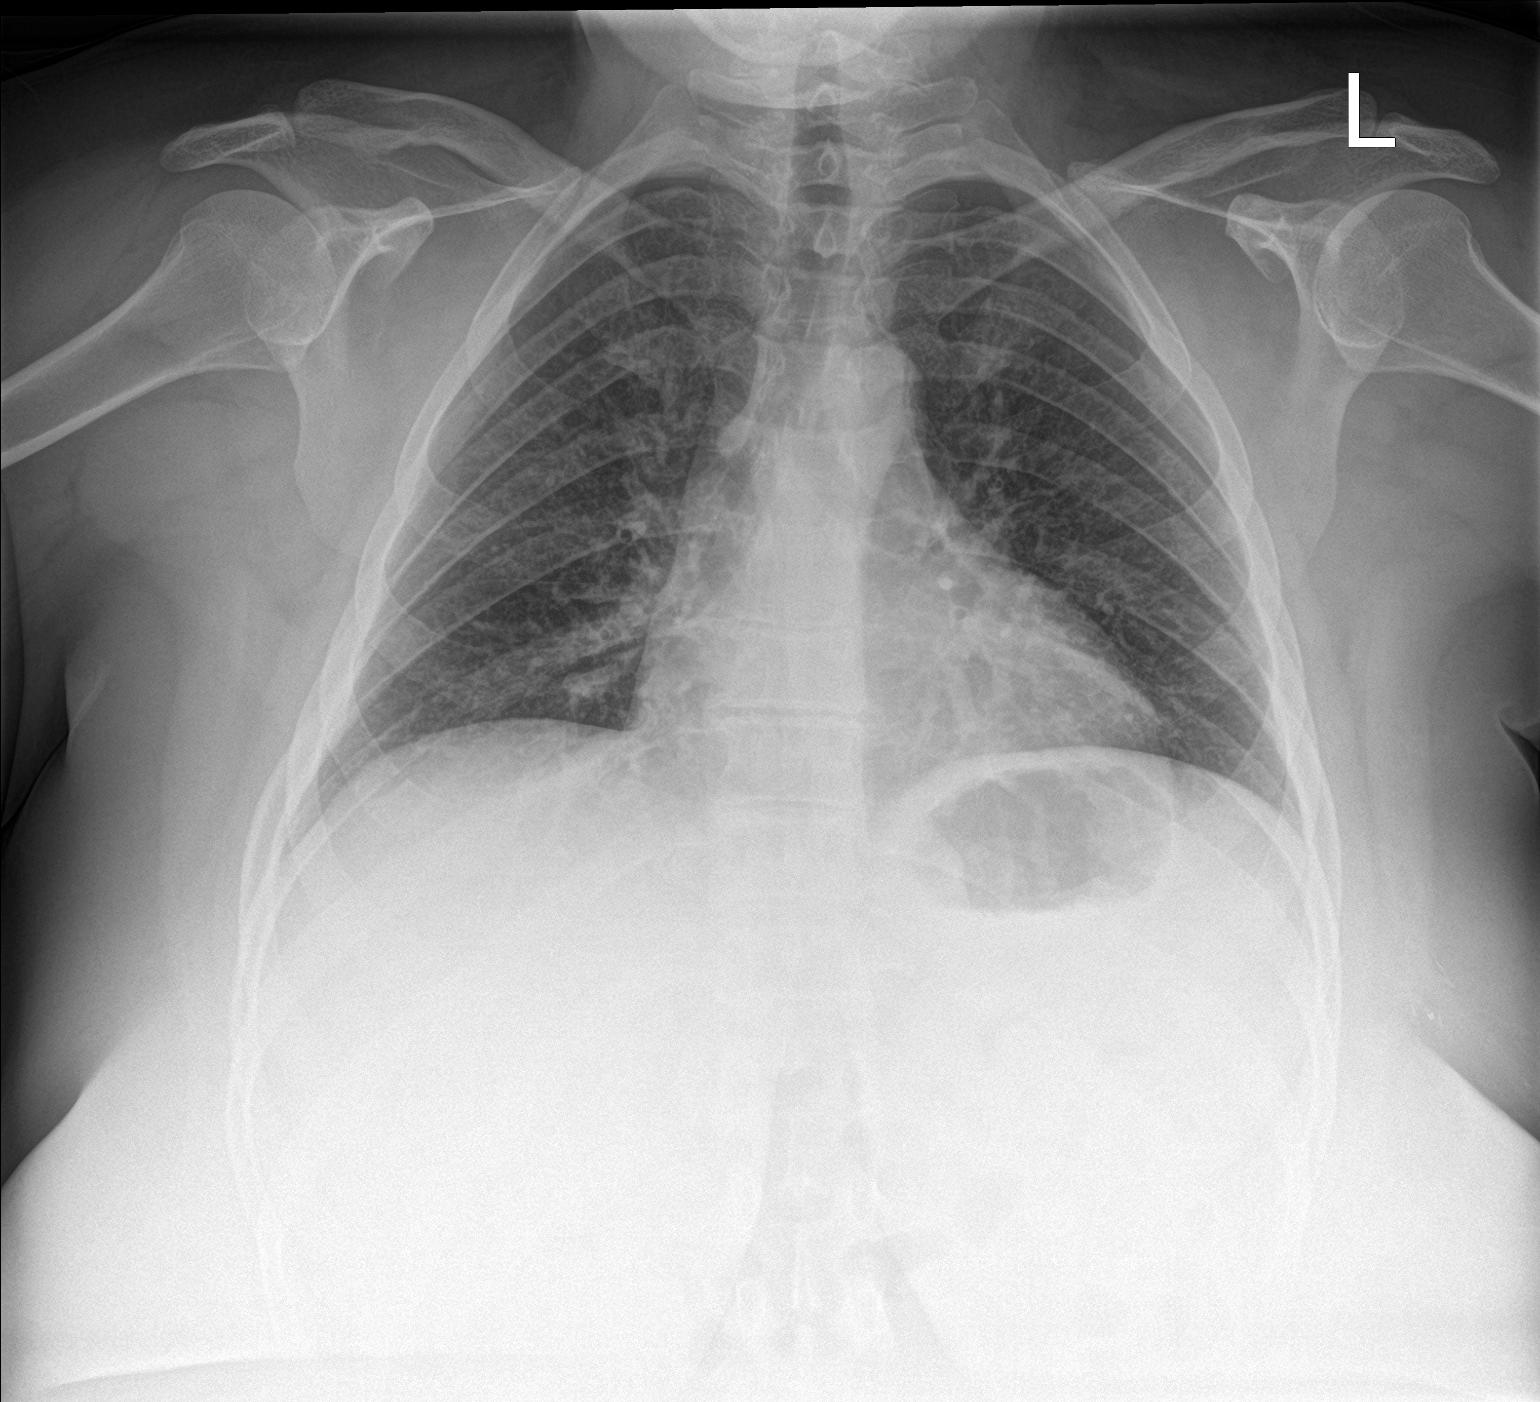

[chest lat]
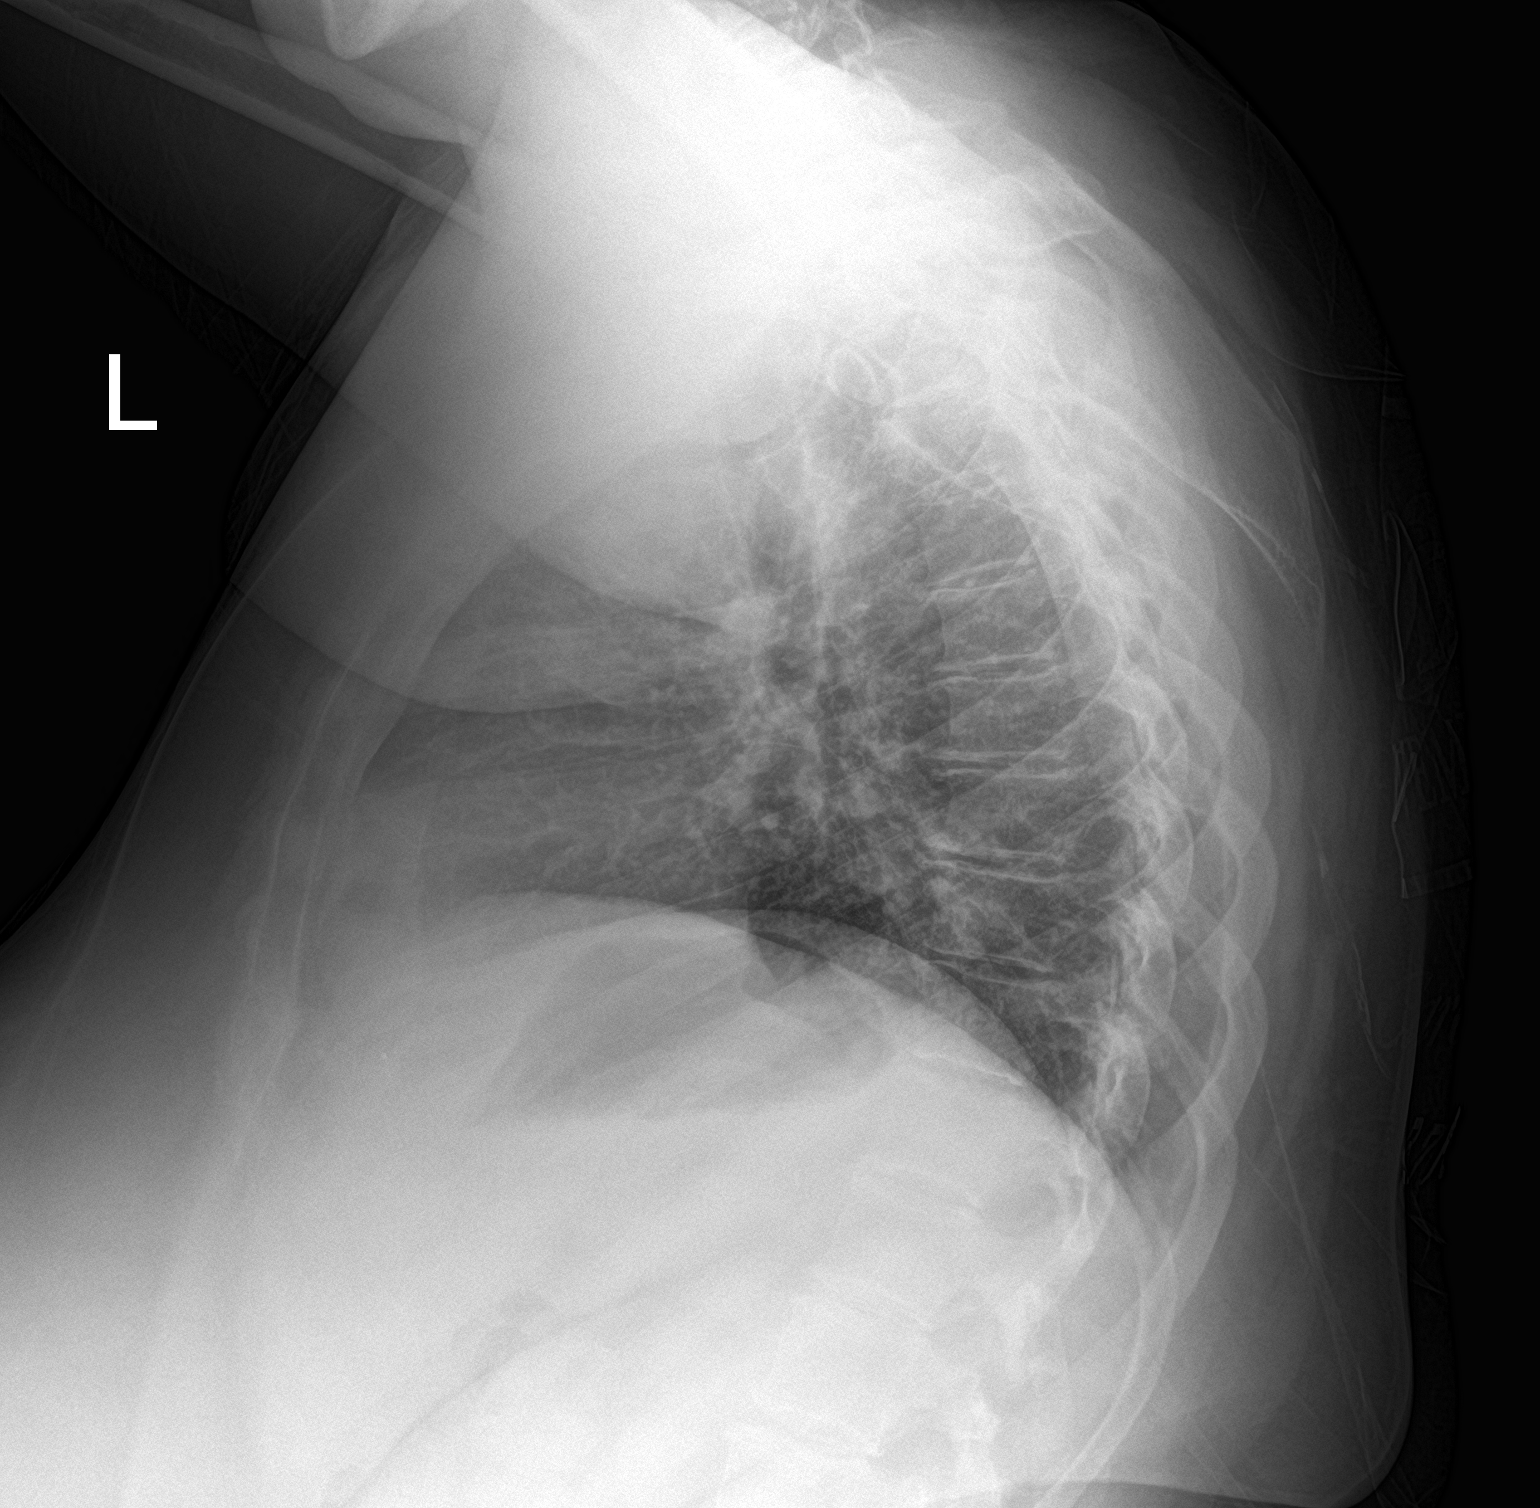

[2 of 2 positions shown; findings below may reference images not displayed]

FINDINGS: Heart and mediastinal contours are within normal limits. No focal
opacities or effusions. No acute bony abnormality.
IMPRESSION: No active cardiopulmonary disease.

## 2018-12-04 ENCOUNTER — Telehealth: Payer: Self-pay | Admitting: *Deleted

## 2018-12-04 NOTE — Telephone Encounter (Signed)
Received call from pt with hx of left breast lumpectomy stating she has been experiencing severe pain and swelling in her entire left arm.   Pt denies redness, streaking, fever, or recent trama.  Pt states her surgeon is no longer part of the practice and would like to see Dr. Lindi Adie. Apt made for pt this Friday 12/06/2018.  Pt appreciative of call and being able to schedule her.

## 2018-12-05 NOTE — Progress Notes (Signed)
Patient Care Team: Patient, No Pcp Per as PCP - General (General Practice) Alphonsa Overall, MD as Consulting Physician (General Surgery) Nicholas Lose, MD as Consulting Physician (Hematology and Oncology) Thea Silversmith, MD as Consulting Physician (Radiation Oncology) Rockwell Germany, RN as Registered Nurse Mauro Kaufmann, RN as Registered Nurse Holley Bouche, NP (Inactive) as Nurse Practitioner (Nurse Practitioner)  DIAGNOSIS:    ICD-10-CM   1. Malignant neoplasm of upper-outer quadrant of left breast in female, estrogen receptor positive ([place])  C50.412 Ambulatory referral to Physical Therapy   Z17.0     SUMMARY OF ONCOLOGIC HISTORY: Oncology History  Breast cancer of upper-outer quadrant of left female breast ([place])  07/14/2014 Mammogram   Left breast UOQ 2:00 position: 1.4 x 1.1 x 1.2 cm mass with indistinct, spiculated margins. Right breast negative.   07/14/2014 Breast US   1.5 cm hypoechoic, irregular mass with spiculated margins UOQ left breast at 2:00, 15 cm from nipple. Intramammary LN 1.4 cm superior to the mass (thin cortex) with another IM LN 3 cm inferior to the index mass with mild focal cortical thickening    07/14/2014 Initial Biopsy   Left breast needle core biopsy (UOQ, 2:00 position): IDC with DCIS; grade 2, ER 100%, PR 96%, HER-2 negative ratio 1.17, Ki-67 17%;  Intramammary LN needle core biopsy (outer left breast, posterior 3:00 position): negative for malignancy.   07/24/2014 Breast MRI   Left breast: 1.4 x 1.4 x 1.3 cm enhancing mass with irregular borders at 3 o'clock position. Right breast with no abnormal enhancement. No abnormal appearing LNs.    07/29/2014 Procedure   Genetic counseling/testing: Breast/Ovarian cancer panel ([facility]) shows no clinically significant variants in ATM, BARD1, BRCA1, BRCA2, BRIP1, CDH1, CHEK2, EPCAM, FANCC, MLH1, MSH2, MSH6, NBN, PALB2, PMS2, PTEN, RAD51C, RAD51D, STK11, TP53, and XRCC2.     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 Negative margins. ER+ (100%), PR+ (96%), HER-2 repeated and negative by FISH.  Ki-67 17%.   08/06/2014 Clinical Stage   Stage IA: T1c, N0 M0   08/06/2014 Oncotype testing   Recurrence score: 7 (6% ROR).  No chemo ([facility]).    09/16/2014 - 11/09/2014 Radiation Therapy   Adjuvant RT completed Pablo Ledger). Left breast/ 50.4 Gy at 1.8 Gy per fraction x 28 fractions.  Left breast boost/ 10 Gy at 2 Gy per fraction x 5 fractions   11/25/2014 - 01/08/2015 Anti-estrogen oral therapy   Anastrozole 1 mg daily stopped due to headaches     CHIEF COMPLIANT: Surveillance of breast cancer and left arm lymphedema  INTERVAL HISTORY: [name] is a 43 y.o. with above-mentioned history of left breast cancer treated with lumpectomy, radiation, and who could not tolerate anastrozole. She is currently on surveillance. I last saw her 8 months ago. Her most recent mammogram on 09/03/18 showed no evidence of malignancy. She presents to the clinic today for follow-up of pain and swelling in her left arm.   REVIEW OF SYSTEMS:   Constitutional: Denies fevers, chills or abnormal weight loss Eyes: Denies blurriness of vision Ears, nose, mouth, throat, and face: Denies mucositis or sore throat Respiratory: Denies cough, dyspnea or wheezes Cardiovascular: Denies palpitation, chest discomfort Gastrointestinal: Denies nausea, heartburn or change in bowel habits Skin: Denies abnormal skin rashes Lymphatics: Denies new lymphadenopathy or easy bruising Neurological: Denies numbness, tingling or new weaknesses Behavioral/Psych: Mood is stable, no  new changes  Extremities: No lower extremity edema Breast: denies any pain or lumps or nodules in either breasts All other systems were reviewed with the patient and are negative.  I have reviewed the past medical history, past surgical  history, social history and family history with the patient and they are unchanged from previous note.  ALLERGIES:  has No Known Allergies.  MEDICATIONS:  Current Outpatient Medications  Medication Sig Dispense Refill  . acetaminophen (TYLENOL) 500 MG tablet Take 1,000 mg by mouth every 6 (six) hours as needed for mild pain.    Marland Kitchen albuterol (PROVENTIL HFA;VENTOLIN HFA) 108 (90 Base) MCG/ACT inhaler Inhale 1-2 puffs into the lungs every 6 (six) hours as needed for wheezing or shortness of breath. (Patient not taking: Reported on 07/19/2018) 1 Inhaler 0  . benzonatate (TESSALON) 100 MG capsule Take 1 capsule (100 mg total) by mouth 3 (three) times daily as needed for cough. (Patient not taking: Reported on 07/19/2018) 21 capsule 0  . HYDROcodone-homatropine (HYCODAN) 5-1.5 MG/5ML syrup Take 5 mLs by mouth every 6 (six) hours as needed for cough. (Patient not taking: Reported on 07/19/2018) 120 mL 0  . ibuprofen (ADVIL,MOTRIN) 800 MG tablet Take 1 tablet (800 mg total) by mouth every 8 (eight) hours as needed for fever, headache, moderate pain or cramping. (Patient not taking: Reported on 07/17/2018) 21 tablet 0  . naproxen sodium (ALEVE) 220 MG tablet Take 1 tablet (220 mg total) by mouth 2 (two) times daily as needed (pain). (Patient not taking: Reported on 07/17/2018)    . ondansetron (ZOFRAN) 4 MG tablet Take 1 tablet (4 mg total) by mouth every 8 (eight) hours as needed for nausea or vomiting. 12 tablet 0   No current facility-administered medications for this visit.     PHYSICAL EXAMINATION: ECOG PERFORMANCE STATUS: 1 - Symptomatic but completely ambulatory  There were no vitals filed for this visit. There were no vitals filed for this visit.  GENERAL: alert, no distress and comfortable SKIN: skin color, texture, turgor are normal, no rashes or significant lesions EYES: normal, Conjunctiva are pink and non-injected, sclera clear OROPHARYNX: no exudate, no erythema and lips, buccal mucosa, and  tongue normal  NECK: supple, thyroid normal size, non-tender, without nodularity LYMPH: no palpable lymphadenopathy in the cervical, axillary or inguinal LUNGS: clear to auscultation and percussion with normal breathing effort HEART: regular rate & rhythm and no murmurs and no lower extremity edema ABDOMEN: abdomen soft, non-tender and normal bowel sounds MUSCULOSKELETAL: no cyanosis of digits and no clubbing  NEURO: alert & oriented x 3 with fluent speech, no focal motor/sensory deficits EXTREMITIES: No lower extremity edema BREAST:Swelling under left arm and pain  LABORATORY DATA:  I have reviewed the data as listed CMP Latest Ref Rng & Units 07/19/2018 07/17/2018 04/11/2018  Glucose 70 - 99 mg/dL 100(H) 109(H) 82  BUN 6 - 20 mg/dL 9 15 11   Creatinine 0.44 - 1.00 mg/dL 1.03(H) 0.90 0.89  Sodium 135 - 145 mmol/L 137 137 141  Potassium 3.5 - 5.1 mmol/L 3.7 3.8 3.6  Chloride 98 - 111 mmol/L 104 105 105  CO2 22 - 32 mmol/L 24 25 28   Calcium 8.9 - 10.3 mg/dL 9.0 8.9 9.5  Total Protein 6.5 - 8.1 g/dL 7.0 7.1 7.2  Total Bilirubin 0.3 - 1.2 mg/dL 0.3 0.5 0.4  Alkaline Phos 38 - 126 U/L 72 71 94  AST 15 - 41 U/L 28 23 21   ALT 0 - 44 U/L 25 23 23  Lab Results  Component Value Date   WBC 3.5 (L) 07/19/2018   HGB 12.8 07/19/2018   HCT 41.3 07/19/2018   MCV 83.8 07/19/2018   PLT 279 07/19/2018   NEUTROABS 4.1 04/11/2018    ASSESSMENT & PLAN:  Breast cancer of upper-outer quadrant of left female breast ([place]) Left breast lumpectomy: Invasive ductal carcinoma with DCIS, no LVI, 2 sentinel nodes negative, 1.3 cm, grade 1, 100%, PR 96 wasn't, HER-2 negative Ki-67 17% T1 cN0 M0 stage IA Oncotype DX recurrence score is 7, 6% ROR status post radiation completed May 2016, patient had hysterectomy with bilateral salpingo-oophorectomy 2011) Anastrozole 1 mg daily started June 2016 stopped July 2016 due to severe headaches  Current treatment: Unable to tolerate antiestrogen therapy. Severe  anxiety: PCP taking care. Shes contemplating on seeing a physicatrist Severe fatigue which is chronic  Breast cancer surveillance: 1.  Breast exam 04/11/2018: Benign 2. mammogram 09/03/2018: No evidence of malignancy breast density category A  Left arm axilla: Consulted physical therapy.  Return to clinic in 1 year for follow-up  Orders Placed This Encounter  Procedures  . Ambulatory referral to Physical Therapy    Referral Priority:   Routine    Referral Type:   Physical Medicine    Referral Reason:   Specialty Services Required    Requested Specialty:   Physical Therapy    Number of Visits Requested:   1   The patient has a good understanding of the overall plan. she agrees with it. she will call with any problems that may develop before the next visit here.  Nicholas Lose, MD 12/06/2018  Julious Oka Dorshimer am acting as scribe for Dr. Nicholas Lose.  I have reviewed the above documentation for accuracy and completeness, and I agree with the above.

## 2018-12-06 ENCOUNTER — Inpatient Hospital Stay: Payer: PRIVATE HEALTH INSURANCE | Attending: Hematology and Oncology | Admitting: Hematology and Oncology

## 2018-12-06 DIAGNOSIS — Z923 Personal history of irradiation: Secondary | ICD-10-CM | POA: Diagnosis not present

## 2018-12-06 DIAGNOSIS — C50412 Malignant neoplasm of upper-outer quadrant of left female breast: Secondary | ICD-10-CM | POA: Diagnosis not present

## 2018-12-06 DIAGNOSIS — Z170 Estrogen receptor positive status [ER+]: Secondary | ICD-10-CM | POA: Diagnosis not present

## 2018-12-06 DIAGNOSIS — Z79811 Long term (current) use of aromatase inhibitors: Secondary | ICD-10-CM

## 2018-12-06 NOTE — Assessment & Plan Note (Signed)
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 Severe anxiety on Xanax Severe fatigue which is chronic  Breast cancer surveillance: 1.  Breast exam 04/11/2018: Benign 2. mammogram 09/03/2018: No evidence of malignancy breast density category A  Return to clinic in 1 year for follow-up

## 2018-12-09 ENCOUNTER — Ambulatory Visit: Payer: PRIVATE HEALTH INSURANCE | Attending: Hematology and Oncology | Admitting: Physical Therapy

## 2019-02-06 ENCOUNTER — Ambulatory Visit
Admission: EM | Admit: 2019-02-06 | Discharge: 2019-02-06 | Disposition: A | Discharge status: Home / Self Care | Payer: PRIVATE HEALTH INSURANCE | Attending: Physician Assistant | Admitting: Physician Assistant

## 2019-02-06 ENCOUNTER — Other Ambulatory Visit: Payer: Self-pay

## 2019-02-06 DIAGNOSIS — R0981 Nasal congestion: Secondary | ICD-10-CM | POA: Diagnosis not present

## 2019-02-06 DIAGNOSIS — R05 Cough: Secondary | ICD-10-CM | POA: Diagnosis not present

## 2019-02-06 DIAGNOSIS — J029 Acute pharyngitis, unspecified: Secondary | ICD-10-CM

## 2019-02-06 DIAGNOSIS — Z20828 Contact with and (suspected) exposure to other viral communicable diseases: Secondary | ICD-10-CM | POA: Diagnosis not present

## 2019-02-06 DIAGNOSIS — Z20822 Contact with and (suspected) exposure to covid-19: Secondary | ICD-10-CM

## 2019-02-06 LAB — POCT RAPID STREP A (OFFICE): Rapid Strep A Screen: NEGATIVE

## 2019-02-06 MED ORDER — LIDOCAINE VISCOUS HCL 2 % MT SOLN: OROMUCOSAL | 0 refills | Status: DC

## 2019-02-06 MED ORDER — BENZONATATE 100 MG PO CAPS: 100.0000 mg | ORAL_CAPSULE | Freq: Three times a day (TID) | ORAL | 0 refills | Status: DC

## 2019-02-06 NOTE — ED Provider Notes (Signed)
[facility]    CSN: [id number] Arrival date & time: 02/06/19  1525     History   Chief Complaint Chief Complaint  Patient presents with   Sore Throat    HPI [name] is a 43 y.o. female.    43 year old female with history of asthma, breast cancer status post radiation in remission, seizures, comes in for 3-day history of URI symptoms.  She has had cough, sore throat, nasal congestion, body aches.  Denies fever, chills.  She has suprapubic cramping that is intermittent without obvious aggravating or alleviating factor.  Nausea without vomiting.  Denies diarrhea or constipation.  Denies urinary symptoms such as frequency, dysuria, hematuria.  Denies loss of taste or smell.  She feels short of breath only when wearing masks.  Denies chest pain, orthopnea.  History of hysterectomy.  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  No antipyretics in the last 8 hours.     Past Medical History:  Diagnosis Date   Asthma    no current med.   Breast cancer of upper-outer quadrant of left female breast ([place]) 07/16/2014   Heart murmur    as a child   History of seizures    during pregnancies; last seizure 3 yrs. 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      Home Medications    Prior to Admission medications   Medication Sig Start Date End Date Taking? Authorizing Provider  acetaminophen (TYLENOL) 500 MG tablet Take 1,000 mg by mouth every 6 (six) hours as needed for mild pain.    [provider]  albuterol (PROVENTIL HFA;VENTOLIN HFA) 108 (90 Base) MCG/ACT inhaler Inhale 1-2 puffs into the lungs every 6 (six) hours as needed for wheezing or shortness of breath. Patient not taking: Reported on 07/19/2018 06/08/18   Long, Wonda Olds, MD  benzonatate (TESSALON) 100 MG capsule Take 1 capsule (100 mg total) by mouth every 8 (eight) hours. 02/06/19   Tasia Catchings, Zaila Crew V, PA-C  HYDROcodone-homatropine (HYCODAN) 5-1.5 MG/5ML syrup Take 5 mLs by mouth every 6 (six) hours as needed for cough. Patient not taking: Reported on 07/19/2018 07/17/18   Robinson, [place] N, PA-C  ibuprofen (ADVIL,MOTRIN) 800 MG tablet Take 1 tablet (  800 mg total) by mouth every 8 (eight) hours as needed for fever, headache, moderate pain or cramping. Patient not taking: Reported on 07/17/2018 06/08/18   Long, Wonda Olds, MD  lidocaine (XYLOCAINE) 2 % solution 5-15 mL to gurgle as needed. 02/06/19   Tasia Catchings, Dejay Kronk V, PA-C  naproxen sodium (ALEVE) 220 MG tablet Take 1 tablet (220 mg total) by mouth 2 (two) times daily as needed (pain). Patient not taking: Reported on 07/17/2018 04/11/18   Nicholas Lose, MD  ondansetron (ZOFRAN) 4 MG tablet Take 1 tablet (4 mg total) by mouth every 8 (eight) hours as needed for nausea or  vomiting. 07/19/18   Scheidler, Rodena Goldmann, MD    Family History Family History  Problem Relation Age of Onset   Hypertension Mother    Breast cancer Other        MGM's sister   Breast cancer Maternal Grandmother        dx in her 60s    Social History Social History   Tobacco Use   Smoking status: Never Smoker   Smokeless tobacco: Never Used  Substance Use Topics   Alcohol use: Yes    Comment: occ   Drug use: No     Allergies   Patient has no known allergies.   Review of Systems Review of Systems  Reason unable to perform ROS: See HPI as above.     Physical Exam Triage Vital Signs ED Triage Vitals  Enc Vitals Group     BP 02/06/19 1539 121/60     Pulse Rate 02/06/19 1539 74     Resp 02/06/19 1539 18     Temp 02/06/19 1539 98.1 F (36.7 C)     Temp Source 02/06/19 1539 Oral     SpO2 --      Weight --      Height --      Head Circumference --      Peak Flow --      Pain Score 02/06/19 1540 0     Pain Loc --      Pain Edu? --      Excl. in [place]? --    No data found.  Updated Vital Signs BP 121/60 (BP Location: Left Arm)    Pulse 74    Temp 98.1 F (36.7 C) (Oral)    Resp 18   Physical Exam Constitutional:      General: She is not in acute distress.    Appearance: Normal appearance. She is well-developed. She is not ill-appearing, toxic-appearing or diaphoretic.  HENT:     Head: Normocephalic and atraumatic.     Mouth/Throat:     Mouth: Mucous membranes are moist.     Pharynx: Oropharynx is clear. Uvula midline.  Eyes:     Conjunctiva/sclera: Conjunctivae normal.     Pupils: Pupils are equal, round, and reactive to light.  Neck:     Musculoskeletal: Normal range of motion and neck supple.  Cardiovascular:     Rate and Rhythm: Normal rate and regular rhythm.     Heart sounds: Normal heart sounds. No murmur. No friction rub. No gallop.   Pulmonary:     Effort: Pulmonary effort is normal. No accessory muscle usage, prolonged expiration,  respiratory distress or retractions.     Breath sounds: Normal breath sounds. No wheezing or rales.     Comments: Lungs clear to auscultation without adventitious lung sounds. Abdominal:     General: Bowel sounds are normal.  Palpations: Abdomen is soft.     Tenderness: There is no abdominal tenderness. There is no right CVA tenderness, left CVA tenderness, guarding or rebound.  Skin:    General: Skin is warm and dry.  Neurological:     General: No focal deficit present.     Mental Status: She is alert and oriented to person, place, and time.  Psychiatric:        Behavior: Behavior normal.        Judgment: Judgment normal.      UC Treatments / Results  Labs (all labs ordered are listed, but only abnormal results are displayed) Labs Reviewed  POCT RAPID STREP A (OFFICE) - Normal    EKG   Radiology No results found.  Procedures Procedures (including critical care time)  Medications Ordered in UC Medications - No data to display  Initial Impression / Assessment and Plan / UC Course  I have reviewed the triage vital signs and the nursing notes.  Pertinent labs & imaging results that were available during my care of the patient were reviewed by me and considered in my medical decision making (see chart for details).    Rapid strep negative.  COVID testing sent.  Patient to remain quarantined until results return.  Discussed symptomatic treatment.  Push fluids.  Return precautions given.  Patient expresses understanding and agrees with plan.  Final Clinical Impressions(s) / UC Diagnoses   Final diagnoses:  Suspected Covid-19 Virus Infection   ED Prescriptions    Medication Sig Dispense Auth. Provider   benzonatate (TESSALON) 100 MG capsule Take 1 capsule (100 mg total) by mouth every 8 (eight) hours. 21 capsule Jushua Waltman V, PA-C   lidocaine (XYLOCAINE) 2 % solution 5-15 mL to gurgle as needed. 150 mL Tobin Chad, [place] 02/06/19 1655

## 2019-02-06 NOTE — ED Triage Notes (Signed)
Pt c/o sore throat, lower abdominal cramping, and dry cough x3 days

## 2019-02-06 NOTE — Discharge Instructions (Addendum)
Rapid strep negative. As discussed, cannot rule out COVID. Currently, no alarming signs. Testing ordered. I would like you to quarantine until testing results. Tessalon for cough. You can use lidocaine for sore throat, do not eat or drink for the next 40 mins after use as it can stunt your gag reflex. If experiencing shortness of breath, trouble breathing, call 911 and provide them with your current situation.

## 2019-02-07 LAB — NOVEL CORONAVIRUS, NAA: SARS-CoV-2, NAA: NOT DETECTED

## 2019-02-10 ENCOUNTER — Encounter (HOSPITAL_COMMUNITY): Payer: Self-pay

## 2019-04-14 ENCOUNTER — Inpatient Hospital Stay: Payer: PRIVATE HEALTH INSURANCE | Attending: Hematology and Oncology | Admitting: Hematology and Oncology

## 2019-04-14 NOTE — Assessment & Plan Note (Deleted)
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 Severe anxiety: PCP taking care. Shes contemplating on seeing a physicatrist Severe fatigue which is chronic  Breast cancer surveillance: 1.Breast exam 04/14/2019: Benign 2.mammogram 09/03/2018: No evidence of malignancy breast density category A  Return to clinic in 1 year for follow-up

## 2019-04-30 ENCOUNTER — Ambulatory Visit
Admission: EM | Admit: 2019-04-30 | Discharge: 2019-04-30 | Disposition: A | Discharge status: Home / Self Care | Payer: PRIVATE HEALTH INSURANCE | Attending: Physician Assistant | Admitting: Physician Assistant

## 2019-04-30 DIAGNOSIS — J069 Acute upper respiratory infection, unspecified: Secondary | ICD-10-CM

## 2019-04-30 DIAGNOSIS — R059 Cough, unspecified: Secondary | ICD-10-CM

## 2019-04-30 DIAGNOSIS — R05 Cough: Secondary | ICD-10-CM

## 2019-04-30 DIAGNOSIS — Z20828 Contact with and (suspected) exposure to other viral communicable diseases: Secondary | ICD-10-CM | POA: Diagnosis not present

## 2019-04-30 MED ORDER — ALBUTEROL SULFATE HFA 108 (90 BASE) MCG/ACT IN AERS: 2.0000 | INHALATION_SPRAY | Freq: Once | RESPIRATORY_TRACT | Status: DC

## 2019-04-30 MED ORDER — HYDROCODONE-HOMATROPINE 5-1.5 MG/5ML PO SYRP: 5.0000 mL | ORAL_SOLUTION | Freq: Four times a day (QID) | ORAL | 0 refills | Status: DC | PRN

## 2019-04-30 MED ORDER — PREDNISONE 50 MG PO TABS: 50.0000 mg | ORAL_TABLET | Freq: Every day | ORAL | 0 refills | Status: DC

## 2019-04-30 NOTE — Discharge Instructions (Signed)
As discussed, cannot rule out COVID. Currently, no alarming signs. Testing ordered. I would like you to quarantine until testing results. Prednisone for cough and inflammation. Hycodan cough syrup for further relief. Albuterol inhaler as needed for shortness of breath, wheezing. You can take over the counter flonase/nasacort to help with nasal congestion/drainage. If experiencing shortness of breath, trouble breathing, go to the emergency department for further evaluation needed.

## 2019-04-30 NOTE — ED Triage Notes (Signed)
Pt c/o cough, sore throat and chills x3 days

## 2019-04-30 NOTE — ED Provider Notes (Signed)
[facility]    CSN: [id number] Arrival date & time: 04/30/19  1712      History   Chief Complaint Chief Complaint  Patient presents with  . Cough    HPI [name] is a 43 y.o. female.   43 year old female with history of asthma, breast cancer status post radiation currently in remission, seizures comes in for 3-day history of URI symptoms.  She has a productive cough, sore throat.  Has had body aches, chills without known fever.  States she has had dyspnea on exertion.  Has chest soreness that is worse with coughing.  Has nausea without vomiting.  Denies loss of taste or smell.  Denies abdominal pain, diarrhea.  Never smoker.  Husband with positive Covid contact, currently coughing, unknown Covid status.     Past Medical History:  Diagnosis Date  . Asthma    no current med.  . Breast cancer of upper-outer quadrant of left female breast ([place]) 07/16/2014  . Heart murmur    as a child  . History of seizures    during pregnancies; last seizure 3 yrs. ago  . Personal history of radiation therapy 2016  . Seizures ([facility])    no medications  . Stress headaches     Patient Active Problem List   Diagnosis Date Noted  . Seizure ([place]) 11/07/2016  . Left lower lobe pneumonia 11/07/2016  . Right flank pain 11/07/2016  . Anxiety 07/05/2015  . Genetic testing 08/18/2014  . Breast cancer of upper-outer quadrant of left female breast ([place]) 07/16/2014  . 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 12/20/2009  . SEIZURE DISORDER 07/07/2009  . DEPRESSION, RECURRENT 08/24/2008  . HYPERSOMNIA, IDIOPATHIC 08/24/2008  . 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 ABDOMINAL HYSTERECTOMY    . BREAST BIOPSY Left 07/14/2014   x2  . BREAST BIOPSY Left 08/21/2016  . BREAST LUMPECTOMY Left 08/06/2014  . BREAST SURGERY     lumpectomy and lymph nodes  . CESAREAN SECTION  [id number]  . CESAREAN SECTION WITH  BILATERAL TUBAL LIGATION  03/03/2003  . CYSTO  12/20/2009  . HYSTEROSCOPY W/ ENDOMETRIAL ABLATION  11/11/2007  . LYSIS OF ADHESION  12/20/2009  . TOTAL ABDOMINAL HYSTERECTOMY W/ BILATERAL SALPINGOOPHORECTOMY  12/20/2009  . TYMPANOSTOMY TUBE PLACEMENT      OB History   No obstetric history on file.      Home Medications    Prior to Admission medications   Medication Sig Start Date End Date Taking? Authorizing Provider  acetaminophen (TYLENOL) 500 MG tablet Take 1,000 mg by mouth every 6 (six) hours as needed for mild pain.    [provider]  HYDROcodone-homatropine (HYCODAN) 5-1.5 MG/5ML syrup Take 5 mLs by mouth every 6 (six) hours as needed for cough. 04/30/19   Tasia Catchings, Malcom Selmer V, PA-C  predniSONE (DELTASONE) 50 MG tablet Take 1 tablet (50 mg total) by mouth daily with breakfast. 04/30/19   Ok Edwards, PA-C    Family History Family History  Problem Relation Age of Onset  . Hypertension Mother   . Breast cancer Other        MGM's sister  . Breast cancer Maternal Grandmother        dx in her 62s    Social History Social History   Tobacco Use  . Smoking status: Never Smoker  . Smokeless tobacco: Never Used  Substance Use Topics  . Alcohol use: Not Currently    Comment:  occ  . Drug use: No     Allergies   Patient has no known allergies.   Review of Systems Review of Systems  Reason unable to perform ROS: See HPI as above.     Physical Exam Triage Vital Signs ED Triage Vitals [04/30/19 1721]  Enc Vitals Group     BP 133/76     Pulse Rate 69     Resp 18     Temp 98.4 F (36.9 C)     Temp Source Oral     SpO2 98 %     Weight      Height      Head Circumference      Peak Flow      Pain Score 0     Pain Loc      Pain Edu?      Excl. in [place]?    No data found.  Updated Vital Signs BP 133/76 (BP Location: Left Arm)   Pulse 69   Temp 98.4 F (36.9 C) (Oral)   Resp 18   SpO2 98%   Physical Exam Constitutional:      General: She is not in acute  distress.    Appearance: Normal appearance. She is not ill-appearing, toxic-appearing or diaphoretic.  HENT:     Head: Normocephalic and atraumatic.     Mouth/Throat:     Mouth: Mucous membranes are moist.     Pharynx: Oropharynx is clear. Uvula midline.  Neck:     Musculoskeletal: Normal range of motion and neck supple.  Cardiovascular:     Rate and Rhythm: Normal rate and regular rhythm.     Heart sounds: Normal heart sounds. No murmur. No friction rub. No gallop.   Pulmonary:     Effort: Pulmonary effort is normal. No accessory muscle usage, prolonged expiration, respiratory distress or retractions.     Comments: Coughing throughout exam. Exam limited as patient unable to take deep breaths without coughing. Lungs clear to auscultation without adventitious lung sounds.  Chest:     Chest wall: Tenderness present.  Neurological:     General: No focal deficit present.     Mental Status: She is alert and oriented to person, place, and time.      UC Treatments / Results  Labs (all labs ordered are listed, but only abnormal results are displayed) Labs Reviewed  NOVEL CORONAVIRUS, NAA    EKG   Radiology No results found.  Procedures Procedures (including critical care time)  Medications Ordered in UC Medications  albuterol (VENTOLIN HFA) 108 (90 Base) MCG/ACT inhaler 2 puff (has no administration in time range)    Initial Impression / Assessment and Plan / UC Course  I have reviewed the triage vital signs and the nursing notes.  Pertinent labs & imaging results that were available during my care of the patient were reviewed by me and considered in my medical decision making (see chart for details).    Albuterol 2 puffs with decrease in cough. LCTAB. Chest soreness reproducible. Patient with O2 sat 98%, ambulated in room without decrease in sat. COVID testing sent, will have patient quarantined until testing results return. Will treat for bronchitis/broncospasm with  prednisone. Albuterol as needed. Hycodan as needed for cough. Strict return precautions given. Patient expresses understanding and agrees to plan.  Final Clinical Impressions(s) / UC Diagnoses   Final diagnoses:  Cough  Viral URI   ED Prescriptions    Medication Sig Dispense Auth. Provider   HYDROcodone-homatropine (HYCODAN) 5-1.5 MG/5ML  syrup Take 5 mLs by mouth every 6 (six) hours as needed for cough. 120 mL Tyler Cubit V, PA-C   predniSONE (DELTASONE) 50 MG tablet Take 1 tablet (50 mg total) by mouth daily with breakfast. 5 tablet Tasia Catchings, Bronco Mcgrory V, PA-C     I have reviewed the PDMP during this encounter.   Ok Edwards, PA-C 04/30/19 1750

## 2019-05-01 LAB — NOVEL CORONAVIRUS, NAA: SARS-CoV-2, NAA: NOT DETECTED

## 2019-05-02 ENCOUNTER — Telehealth: Payer: PRIVATE HEALTH INSURANCE

## 2019-05-02 ENCOUNTER — Ambulatory Visit (INDEPENDENT_AMBULATORY_CARE_PROVIDER_SITE_OTHER): Payer: PRIVATE HEALTH INSURANCE

## 2019-05-02 ENCOUNTER — Telehealth (HOSPITAL_COMMUNITY): Payer: Self-pay | Admitting: Emergency Medicine

## 2019-05-02 ENCOUNTER — Ambulatory Visit
Admission: EM | Admit: 2019-05-02 | Discharge: 2019-05-02 | Disposition: A | Discharge status: Home / Self Care | Payer: PRIVATE HEALTH INSURANCE | Attending: Emergency Medicine | Admitting: Emergency Medicine

## 2019-05-02 DIAGNOSIS — R05 Cough: Secondary | ICD-10-CM | POA: Diagnosis not present

## 2019-05-02 DIAGNOSIS — Z20828 Contact with and (suspected) exposure to other viral communicable diseases: Secondary | ICD-10-CM | POA: Diagnosis not present

## 2019-05-02 DIAGNOSIS — R059 Cough, unspecified: Secondary | ICD-10-CM

## 2019-05-02 NOTE — ED Triage Notes (Signed)
Pt seen here 2 days ago for cough, states now its worse. C/o productive cough x6days now. Had a neg COVID test

## 2019-05-02 NOTE — Telephone Encounter (Signed)
Pt called requesting covid test results. Results reported as negative.  

## 2019-05-02 NOTE — Discharge Instructions (Addendum)
Continue current medication regimen. Continue monitoring symptoms. Go to hospital for further evaluation for worsening cough, fever, chest pain, shortness of breath.

## 2019-05-02 NOTE — ED Provider Notes (Signed)
[facility]    CSN: [id number] Arrival date & time: 05/02/19  1312      History   Chief Complaint Chief Complaint  Patient presents with  . Cough    HPI [name] is a 43 y.o. female with history of breast cancer, asthma, seizures presenting for persistent/worsening cough.  Patient was initially seen for this on 11/4: Records reviewed by me at time of appointment.  Patient given Hycodan, Deltasone for bronchitis/bronchospasm.  Patient also given albuterol inhaler in office.  Since initial evaluation on 11/4, patient reports compliance with medications, supportive care.  States her cough has gotten worse.  Denies chest pain, shortness of breath, fevers.   Past Medical History:  Diagnosis Date  . Asthma    no current med.  . Breast cancer of upper-outer quadrant of left female breast ([place]) 07/16/2014  . Heart murmur    as a child  . History of seizures    during pregnancies; last seizure 3 yrs. ago  . Personal history of radiation therapy 2016  . Seizures ([facility])    no medications  . Stress headaches     Patient Active Problem List   Diagnosis Date Noted  . Seizure ([place]) 11/07/2016  . Left lower lobe pneumonia 11/07/2016  . Right flank pain 11/07/2016  . Anxiety 07/05/2015  . Genetic testing 08/18/2014  . Breast cancer of upper-outer quadrant of left female breast ([place]) 07/16/2014  . 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 12/20/2009  . SEIZURE DISORDER 07/07/2009  . DEPRESSION, RECURRENT 08/24/2008  . HYPERSOMNIA, IDIOPATHIC 08/24/2008  . 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 ABDOMINAL HYSTERECTOMY    . BREAST BIOPSY Left 07/14/2014   x2  . BREAST BIOPSY Left 08/21/2016  . BREAST LUMPECTOMY Left 08/06/2014  . BREAST SURGERY     lumpectomy and lymph nodes  . CESAREAN SECTION  [id number]  . CESAREAN SECTION WITH BILATERAL TUBAL LIGATION  03/03/2003  . CYSTO  12/20/2009  .  HYSTEROSCOPY W/ ENDOMETRIAL ABLATION  11/11/2007  . LYSIS OF ADHESION  12/20/2009  . TOTAL ABDOMINAL HYSTERECTOMY W/ BILATERAL SALPINGOOPHORECTOMY  12/20/2009  . TYMPANOSTOMY TUBE PLACEMENT      OB History   No obstetric history on file.      Home Medications    Prior to Admission medications   Medication Sig Start Date End Date Taking? Authorizing Provider  acetaminophen (TYLENOL) 500 MG tablet Take 1,000 mg by mouth every 6 (six) hours as needed for mild pain.    [provider]  HYDROcodone-homatropine (HYCODAN) 5-1.5 MG/5ML syrup Take 5 mLs by mouth every 6 (six) hours as needed for cough. 04/30/19   Tasia Catchings, Amy V, PA-C  predniSONE (DELTASONE) 50 MG tablet Take 1 tablet (50 mg total) by mouth daily with breakfast. 04/30/19   Ok Edwards, PA-C    Family History Family History  Problem Relation Age of Onset  . Hypertension Mother   . Breast cancer Other        MGM's sister  . Breast cancer Maternal Grandmother        dx in her 77s    Social History Social History   Tobacco Use  . Smoking status: Never Smoker  . Smokeless tobacco: Never Used  Substance Use Topics  . Alcohol use: Not Currently    Comment: occ  . Drug use: No     Allergies   Patient has no known allergies.   Review  of Systems Review of Systems  Constitutional: Negative for fatigue and fever.  HENT: Negative for congestion, dental problem, ear pain, facial swelling, hearing loss, sinus pain, sore throat, trouble swallowing and voice change.   Eyes: Negative for photophobia, pain and visual disturbance.  Respiratory: Positive for cough and wheezing. Negative for shortness of breath.   Cardiovascular: Negative for chest pain and palpitations.  Gastrointestinal: Negative for diarrhea and vomiting.  Musculoskeletal: Negative for arthralgias and myalgias.  Neurological: Negative for dizziness and headaches.     Physical Exam Triage Vital Signs ED Triage Vitals  Enc Vitals Group     BP       Pulse      Resp      Temp      Temp src      SpO2      Weight      Height      Head Circumference      Peak Flow      Pain Score      Pain Loc      Pain Edu?      Excl. in [place]?    No data found.  Updated Vital Signs BP 111/70 (BP Location: Left Arm)   Pulse 72   Temp 97.8 F (36.6 C) (Oral)   Resp 18   SpO2 96%   Visual Acuity Right Eye Distance:   Left Eye Distance:   Bilateral Distance:    Right Eye Near:   Left Eye Near:    Bilateral Near:     Physical Exam Constitutional:      General: She is not in acute distress.    Appearance: She is obese. She is not toxic-appearing.  HENT:     Head: Normocephalic and atraumatic.     Mouth/Throat:     Mouth: Mucous membranes are moist.     Pharynx: Oropharynx is clear.  Eyes:     General: No scleral icterus.    Conjunctiva/sclera: Conjunctivae normal.     Pupils: Pupils are equal, round, and reactive to light.  Neck:     Musculoskeletal: Neck supple. No muscular tenderness.  Cardiovascular:     Rate and Rhythm: Normal rate and regular rhythm.     Heart sounds: No murmur. No gallop.   Pulmonary:     Effort: Pulmonary effort is normal. No respiratory distress.     Breath sounds: Wheezing and rhonchi present.     Comments: Adventitious sounds in all fields Lymphadenopathy:     Cervical: No cervical adenopathy.  Skin:    Capillary Refill: Capillary refill takes less than 2 seconds.     Coloration: Skin is not jaundiced or pale.     Findings: No rash.  Neurological:     General: No focal deficit present.     Mental Status: She is alert and oriented to person, place, and time.      UC Treatments / Results  Labs (all labs ordered are listed, but only abnormal results are displayed) Labs Reviewed  NOVEL CORONAVIRUS, NAA    EKG   Radiology Dg Chest 2 View  Result Date: 05/02/2019 CLINICAL DATA:  Productive cough with thick yellow sputum, fatigue, and chills for 6 days, known COVID-19 exposure, cough which  is worse when lying down EXAM: CHEST - 2 VIEW COMPARISON:  07/19/2018 FINDINGS: Upper normal heart size. Mediastinal contours and pulmonary vascularity normal. Lungs clear. No definite infiltrate, pleural effusion or pneumothorax. Bones unremarkable. IMPRESSION: No acute abnormalities. Electronically Signed   By:  Lavonia Dana M.D.   On: 05/02/2019 14:07    Procedures Procedures (including critical care time)  Medications Ordered in UC Medications - No data to display  Initial Impression / Assessment and Plan / UC Course  I have reviewed the triage vital signs and the nursing notes.  Pertinent labs & imaging results that were available during my care of the patient were reviewed by me and considered in my medical decision making (see chart for details).     Patient afebrile, nontoxic.  Patient with diffuse rhonchi, wheezing in all lung fields on exam despite prednisone: Chest x-ray obtained, reviewed by me radiology: No definite infiltrate, consolidation, pleural effusion, edema.  Given patient's exam findings in setting of compliance with appropriate regimen, and now 5 days since symptom onset, repeat Covid testing performed.  Patient tolerated this well, will continue quarantine, current medication regimen, and monitor symptoms.  Return precautions discussed, patient verbalized understanding and is agreeable to plan. Final Clinical Impressions(s) / UC Diagnoses   Final diagnoses:  Cough     Discharge Instructions     Continue current medication regimen. Continue monitoring symptoms. Go to hospital for further evaluation for worsening cough, fever, chest pain, shortness of breath.    ED Prescriptions    None     PDMP not reviewed this encounter.   Hall-Potvin, [place], [place] 05/02/19 1744

## 2019-05-03 LAB — NOVEL CORONAVIRUS, NAA: SARS-CoV-2, NAA: NOT DETECTED

## 2019-05-05 ENCOUNTER — Telehealth: Payer: Self-pay | Admitting: Emergency Medicine

## 2019-05-05 NOTE — Telephone Encounter (Signed)
Pt called requesting note for work, note written for patient in her Mychart

## 2019-06-24 IMAGING — CT CT ABD-PELV W/ CM
2 of 5 series · 16 of 46 positions shown, 18 images · IV contrast (agent unspecified)
Comparison: 11/07/2016

CLINICAL DATA: Right lower chronic pain with nausea vomiting and
diarrhea.

EXAM:
CT ABDOMEN AND PELVIS WITH CONTRAST
TECHNIQUE: Multidetector CT imaging of the abdomen and pelvis was performed
using the standard protocol following bolus administration of
intravenous contrast.
CONTRAST:  Contrast 100 cc Asovue-QSS

[Series 2: abd/pel with · axial · 0.98mm/px · z∈[+860,+1300]mm · 13 of 102 slices shown, 15 images]
[im 7/102  soft-tissue]
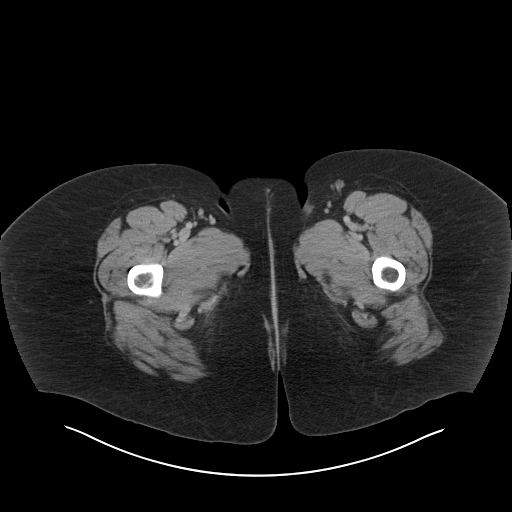
[im 7/102  bone]
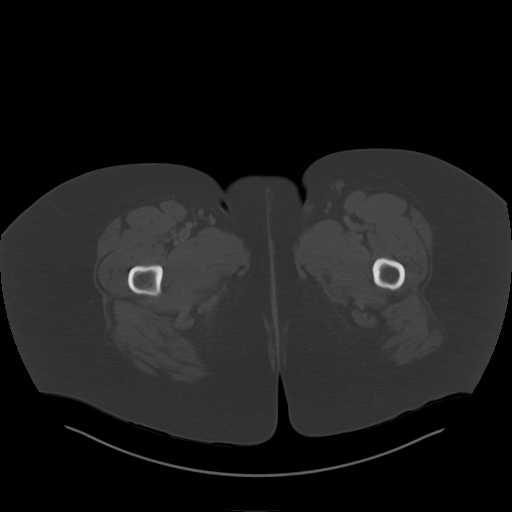
[im 13/102  soft-tissue]
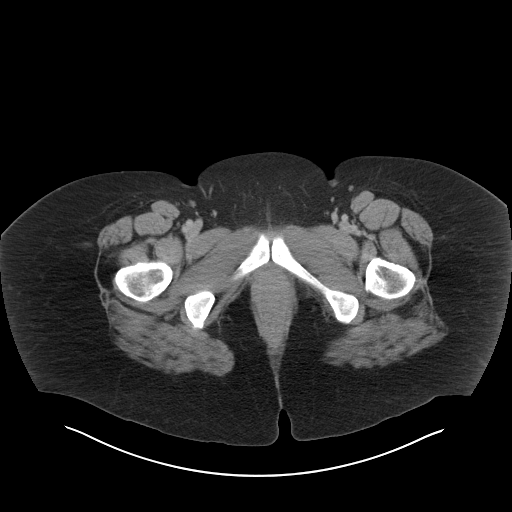
[im 19/102  soft-tissue]
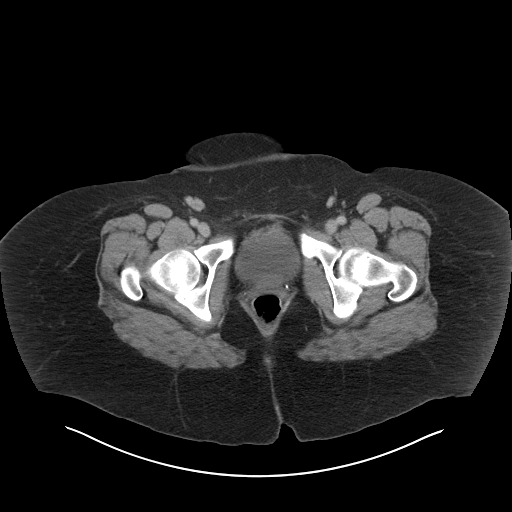
[im 32/102  soft-tissue]
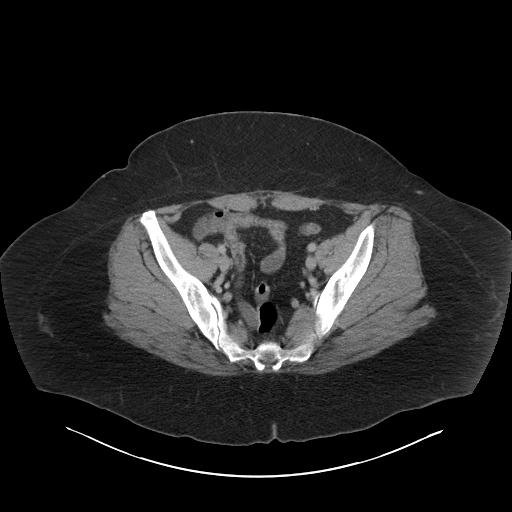
[im 38/102  soft-tissue]
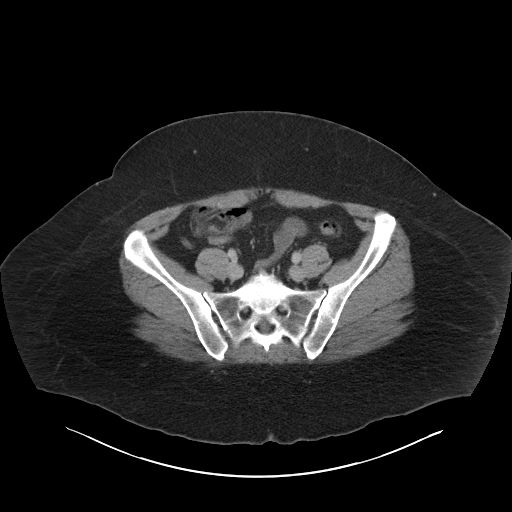
[im 45/102  soft-tissue]
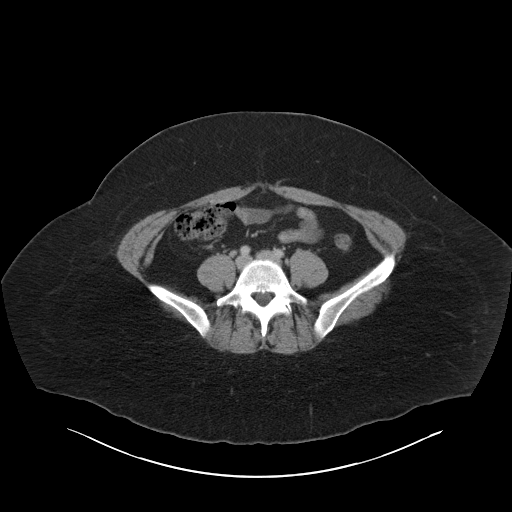
[im 51/102  soft-tissue]
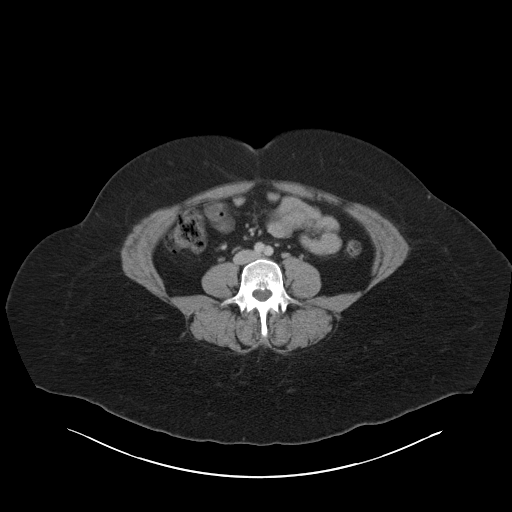
[im 57/102  soft-tissue]
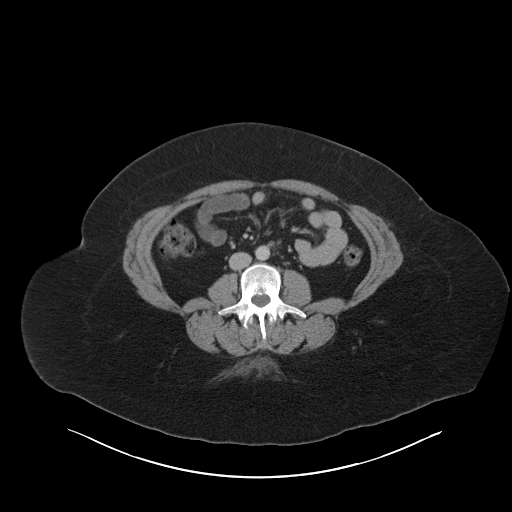
[im 64/102  soft-tissue]
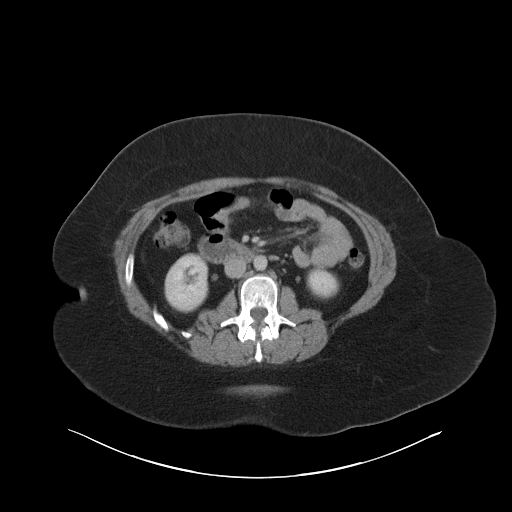
[im 64/102  bone]
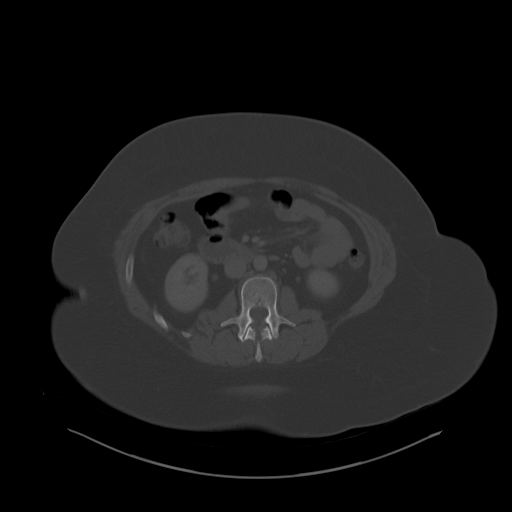
[im 70/102  soft-tissue]
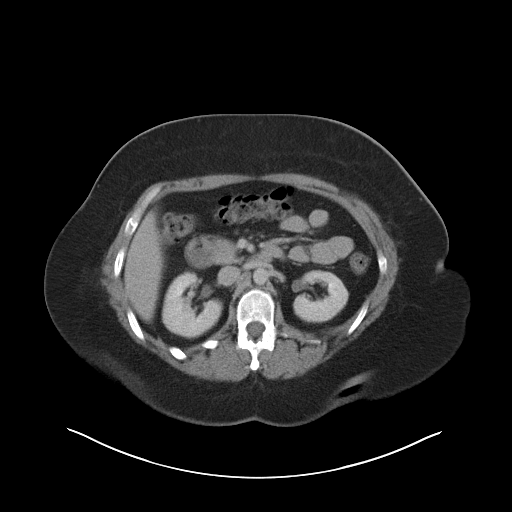
[im 83/102  soft-tissue]
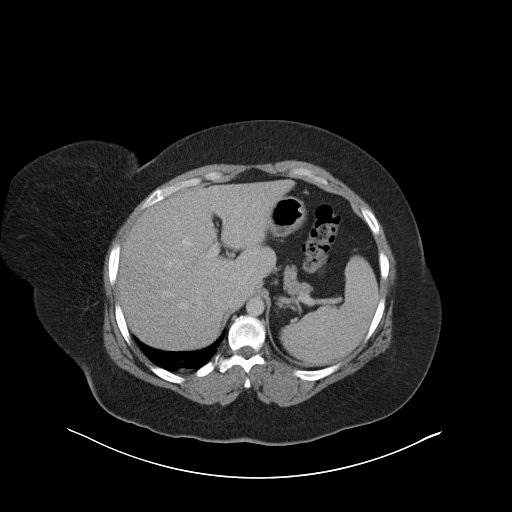
[im 89/102  soft-tissue]
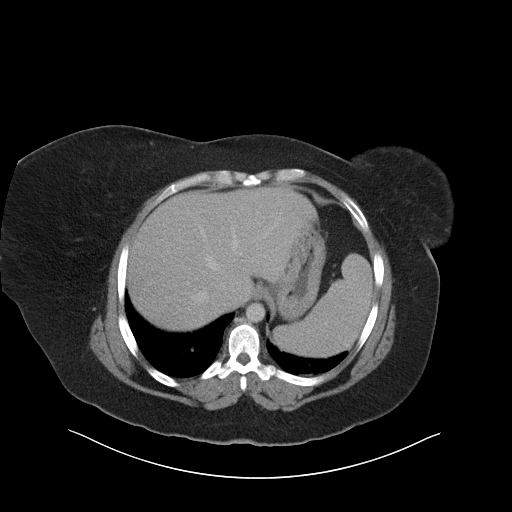
[im 95/102  soft-tissue]
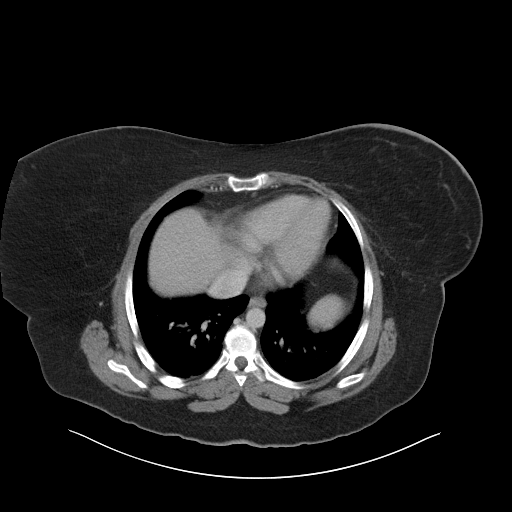

[Series 3: coronal a/|p · coronal · 0.94mm/px · 3 of 168 slices shown]
[im 56/168  soft-tissue]
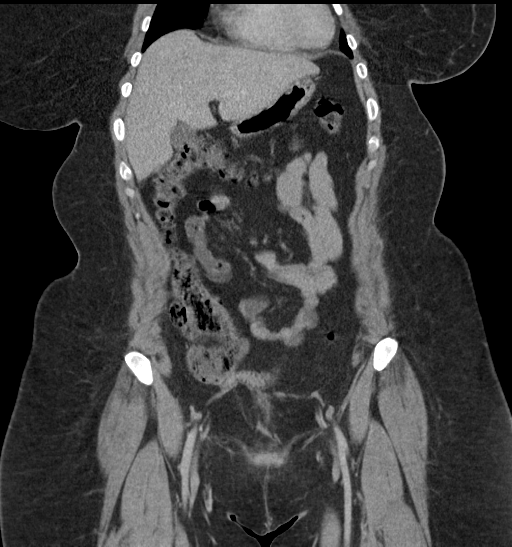
[im 75/168  soft-tissue]
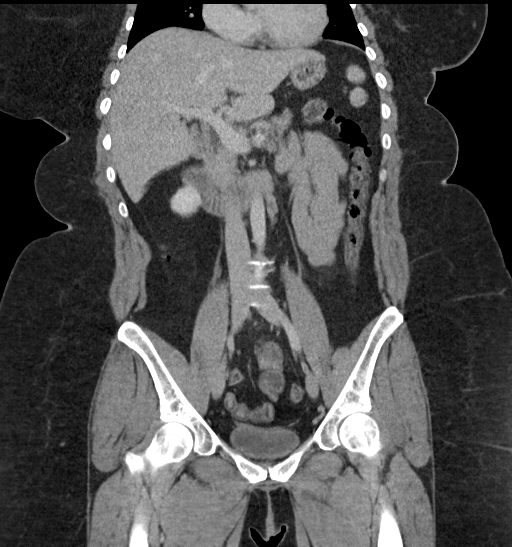
[im 93/168  soft-tissue]
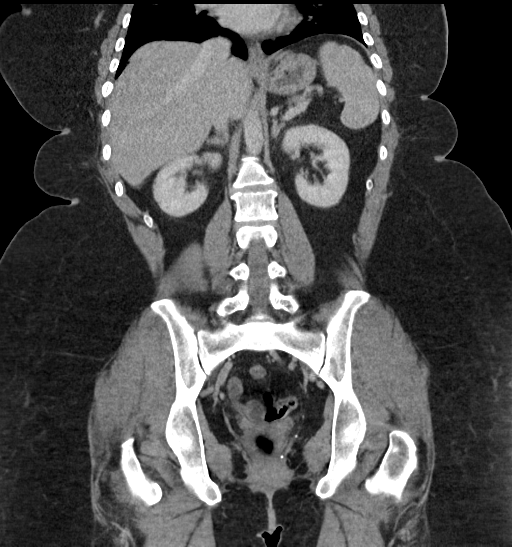

[16 of 46 positions shown; findings below may reference images not displayed]

FINDINGS: Lower chest:  Basilar atelectasis noted bilaterally.

Hepatobiliary: No focal abnormality within the liver parenchyma.
There is no evidence for gallstones, gallbladder wall thickening, or
pericholecystic fluid. No intrahepatic or extrahepatic biliary
dilation.

Pancreas: No focal mass lesion. No dilatation of the main duct. No
intraparenchymal cyst. No peripancreatic edema.

Spleen: No splenomegaly. No focal mass lesion.

Adrenals/Urinary Tract: No adrenal nodule or mass. Kidneys are
unremarkable. No evidence for hydroureter. The urinary bladder
appears normal for the degree of distention.

Stomach/Bowel: Stomach is nondistended. No gastric wall thickening.
No evidence of outlet obstruction. Duodenum is normally positioned
as is the ligament of Treitz. No small bowel wall thickening. No
small bowel dilatation. The terminal ileum is normal. The appendix
is normal. No gross colonic mass. No colonic wall thickening. No
substantial diverticular change.

Vascular/Lymphatic: No abdominal aortic aneurysm. No abdominal
aortic atherosclerotic calcification. There is no gastrohepatic or
hepatoduodenal ligament lymphadenopathy. No intraperitoneal or
retroperitoneal lymphadenopathy. No pelvic sidewall lymphadenopathy.
Prominent right groin lymph nodes are similar to prior.

Reproductive: Uterus surgically absent.  There is no adnexal mass.

Other: No intraperitoneal free fluid.

Musculoskeletal: Bone windows reveal no worrisome lytic or sclerotic
osseous lesions.
IMPRESSION: 1. No acute findings in the abdomen or pelvis to explain the right
lower quadrant pain. Terminal ileum and appendix are normal. No
right adnexal mass.

## 2019-07-21 ENCOUNTER — Other Ambulatory Visit: Payer: Self-pay | Admitting: Hematology and Oncology

## 2019-07-21 DIAGNOSIS — Z853 Personal history of malignant neoplasm of breast: Secondary | ICD-10-CM

## 2019-07-21 DIAGNOSIS — Z9889 Other specified postprocedural states: Secondary | ICD-10-CM

## 2019-09-04 ENCOUNTER — Ambulatory Visit
Admission: RE | Admit: 2019-09-04 | Discharge: 2019-09-04 | Disposition: A | Discharge status: Home / Self Care | Payer: PRIVATE HEALTH INSURANCE | Source: Ambulatory Visit | Attending: Hematology and Oncology | Admitting: Hematology and Oncology

## 2019-09-04 ENCOUNTER — Other Ambulatory Visit: Payer: Self-pay

## 2019-09-04 DIAGNOSIS — Z9889 Other specified postprocedural states: Secondary | ICD-10-CM

## 2019-09-04 DIAGNOSIS — Z853 Personal history of malignant neoplasm of breast: Secondary | ICD-10-CM

## 2019-09-15 ENCOUNTER — Telehealth: Payer: Self-pay | Admitting: Emergency Medicine

## 2019-09-15 ENCOUNTER — Ambulatory Visit
Admission: EM | Admit: 2019-09-15 | Discharge: 2019-09-15 | Disposition: A | Discharge status: Home / Self Care | Payer: PRIVATE HEALTH INSURANCE | Attending: Emergency Medicine | Admitting: Emergency Medicine

## 2019-09-15 ENCOUNTER — Other Ambulatory Visit: Payer: Self-pay

## 2019-09-15 ENCOUNTER — Encounter: Payer: Self-pay | Admitting: Emergency Medicine

## 2019-09-15 DIAGNOSIS — R1030 Lower abdominal pain, unspecified: Secondary | ICD-10-CM | POA: Diagnosis not present

## 2019-09-15 DIAGNOSIS — Z90710 Acquired absence of both cervix and uterus: Secondary | ICD-10-CM

## 2019-09-15 DIAGNOSIS — R110 Nausea: Secondary | ICD-10-CM | POA: Diagnosis not present

## 2019-09-15 DIAGNOSIS — Z98891 History of uterine scar from previous surgery: Secondary | ICD-10-CM

## 2019-09-15 LAB — POCT URINALYSIS DIP (MANUAL ENTRY)
Bilirubin, UA: NEGATIVE
Glucose, UA: NEGATIVE mg/dL
Ketones, POC UA: NEGATIVE mg/dL
Leukocytes, UA: NEGATIVE
Nitrite, UA: NEGATIVE
Protein Ur, POC: NEGATIVE mg/dL
Spec Grav, UA: 1.02 (ref 1.010–1.025)
Urobilinogen, UA: 0.2 E.U./dL
pH, UA: 7.5 (ref 5.0–8.0)

## 2019-09-15 LAB — AMYLASE: Amylase: 27 U/L — ABNORMAL LOW (ref 31–110)

## 2019-09-15 LAB — LIPASE: Lipase: 14 U/L (ref 14–72)

## 2019-09-15 LAB — POCT FASTING CBG KUC MANUAL ENTRY: POCT Glucose (KUC): 117 mg/dL — AB (ref 70–99)

## 2019-09-15 MED ORDER — OMEPRAZOLE 40 MG PO CPDR: 40.0000 mg | DELAYED_RELEASE_CAPSULE | Freq: Every day | ORAL | 0 refills | Status: DC

## 2019-09-15 NOTE — Telephone Encounter (Signed)
Per APP request, called patient to review blood results, confirming identity using two identifiers, and discussed no need to change of plan at this point.  ER precautions reviewed.  Patient verbalized understanding.

## 2019-09-15 NOTE — ED Notes (Signed)
Patient able to ambulate independently  

## 2019-09-15 NOTE — ED Provider Notes (Addendum)
[facility]    CSN: [id number] Arrival date & time: 09/15/19  1344      History   Chief Complaint Chief Complaint  Patient presents with  . Abdominal Pain  . Nausea    HPI [name] is a 44 y.o. female with history of seizures, asthma, breast cancer (not currently receiving treatment) presenting for lower abdominal pain, nausea since yesterday.  Last bowel movement was this morning: Normal for her without blood or melena.  Tried Aleve last night for abdominal pain without significant relief.  Patient also tried Zofran for accompanying nausea without relief.  Patient denies emesis.  Has been able to keep down water, and a handful of cashews about 20 minutes ago.  Patient denied headache, fever, arthralgias, myalgias, known sick contacts.  Denied history of pancreatitis, hypertriglyceridemia.  Patient does not currently have PCP.  Patient is status post cesarean section x3, hysterectomy.  Patient has been dealing with urinary incontinence since her hysterectomy 4 years ago, though has found it has worsened.  Denies saddle area dysphagia, lower extremity weakness, paresthesias, loss of bowel control, low back pain.  Patient denied pelvic or vaginal pain, discharge.   Past Medical History:  Diagnosis Date  . Asthma    no current med.  . Breast cancer of upper-outer quadrant of left female breast ([place]) 07/16/2014  . Heart murmur    as a child  . History of seizures    during pregnancies; last seizure 3 yrs. ago  . Personal history of radiation therapy 2016  . Seizures (H[facility])    no medications  . Stress headaches     Patient Active Problem List   Diagnosis Date Noted  . Seizure ([place]) 11/07/2016  . Left lower lobe pneumonia 11/07/2016  . Right flank pain 11/07/2016  . Anxiety 07/05/2015  . Genetic testing 08/18/2014  . Breast cancer of upper-outer quadrant of left female breast ([place]) 07/16/2014  . 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  12/20/2009  . SEIZURE DISORDER 07/07/2009  . DEPRESSION, RECURRENT 08/24/2008  . HYPERSOMNIA, IDIOPATHIC 08/24/2008  . 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 ABDOMINAL HYSTERECTOMY    . BREAST BIOPSY Left 07/14/2014   x2  . BREAST BIOPSY Left 08/21/2016  . BREAST LUMPECTOMY Left 08/06/2014  . BREAST SURGERY     lumpectomy and lymph nodes  . CESAREAN SECTION  [id number]  . CESAREAN SECTION WITH BILATERAL TUBAL LIGATION  03/03/2003  . CYSTO  12/20/2009  . HYSTEROSCOPY W/ ENDOMETRIAL ABLATION  11/11/2007  . LYSIS OF ADHESION  12/20/2009  . TOTAL ABDOMINAL HYSTERECTOMY W/ BILATERAL SALPINGOOPHORECTOMY  12/20/2009  . TYMPANOSTOMY TUBE PLACEMENT      OB History   No obstetric history on file.      Home Medications    Prior to Admission medications   Medication Sig Start Date End Date Taking? Authorizing Provider  acetaminophen (TYLENOL) 500 MG tablet Take 1,000 mg by mouth every 6 (six) hours as needed for mild pain.    [provider]  omeprazole (PRILOSEC) 40 MG capsule Take 1 capsule (40 mg total) by mouth daily. 09/15/19   Hall-Potvin, [place], PA-C    Family History Family History  Problem Relation Age of Onset  . Hypertension Mother   . Breast cancer Other        MGM's sister  . Breast cancer Maternal Grandmother        dx in her 45s  Social History Social History   Tobacco Use  . Smoking status: Never Smoker  . Smokeless tobacco: Never Used  Substance Use Topics  . Alcohol use: Not Currently    Comment: occ  . Drug use: No     Allergies   Patient has no known allergies.   Review of Systems As per HPI   Physical Exam Triage Vital Signs ED Triage Vitals  Enc Vitals Group     BP      Pulse      Resp      Temp      Temp src      SpO2      Weight      Height      Head Circumference      Peak Flow      Pain Score      Pain Loc      Pain Edu?      Excl. in [place]?    No data found.   Updated Vital Signs BP 126/70 (BP Location: Left Arm)   Pulse 65   Temp 98 F (36.7 C) (Oral)   Resp 18   SpO2 98%   Visual Acuity Right Eye Distance:   Left Eye Distance:   Bilateral Distance:    Right Eye Near:   Left Eye Near:    Bilateral Near:     Physical Exam Constitutional:      General: She is not in acute distress.    Appearance: She is well-developed. She is obese. She is not ill-appearing.  HENT:     Head: Normocephalic and atraumatic.     Mouth/Throat:     Mouth: Mucous membranes are moist.     Pharynx: Oropharynx is clear.  Eyes:     General: No scleral icterus.    Extraocular Movements: Extraocular movements intact.     Pupils: Pupils are equal, round, and reactive to light.  Cardiovascular:     Rate and Rhythm: Normal rate and regular rhythm.  Pulmonary:     Effort: Pulmonary effort is normal. No respiratory distress.     Breath sounds: No wheezing.  Abdominal:     General: A surgical scar is present. Bowel sounds are normal. There is no distension.     Palpations: Abdomen is soft. There is no hepatomegaly or splenomegaly.     Tenderness: There is abdominal tenderness in the left lower quadrant. There is no right CVA tenderness, left CVA tenderness, guarding or rebound. Negative signs include Murphy's sign, Rovsing's sign and McBurney's sign.     Hernia: No hernia is present.  Skin:    Capillary Refill: Capillary refill takes less than 2 seconds.     Coloration: Skin is not cyanotic, jaundiced, mottled or pale.     Findings: No erythema or rash.  Neurological:     General: No focal deficit present.     Mental Status: She is alert and oriented to person, place, and time.      UC Treatments / Results  Labs (all labs ordered are listed, but only abnormal results are displayed) Labs Reviewed  POCT URINALYSIS DIP (MANUAL ENTRY) - Abnormal; Notable for the following components:      Result Value   Blood, UA trace-intact (*)    All other components  within normal limits  POCT FASTING CBG [facility] MANUAL ENTRY - Abnormal; Notable for the following components:   POCT Glucose ([facility]) 117 (*)    All other components within normal limits  AMYLASE  LIPASE    EKG   Radiology No results found.  Procedures Procedures (including critical care time)  Medications Ordered in UC Medications - No data to display  Initial Impression / Assessment and Plan / UC Course  I have reviewed the triage vital signs and the nursing notes.  Pertinent labs & imaging results that were available during my care of the patient were reviewed by me and considered in my medical decision making (see chart for details).     Patient afebrile, nontoxic in office today.  Urine dipstick showing trace intact blood, otherwise unremarkable.  CBG 117: Last ate 20 minutes before test.  No emesis.  We will draw lipase, amylase to screen for pancreatitis.  Reviewed supportive measures thereof.  Patient does endorse history of GERD: Not take anything routinely-we will start omeprazole daily.  Most significant to me is patient needing PCP for further evaluation/management of urinary incontinence s/p hysterectomy.  Provided office contact information.  Return precautions discussed, patient verbalized understanding and is agreeable to plan. Final Clinical Impressions(s) / UC Diagnoses   Final diagnoses:  History of hysterectomy  History of cesarean delivery  Nausea without vomiting  Lower abdominal pain     Discharge Instructions     Eat small meals, drink water, and take tylenol for pain. We will call you with lab results tonight/tomorrow morning. Take omeprazole daily. Important to establish care with PCP for further evaluation and referrals to specialists.    ED Prescriptions    Medication Sig Dispense Auth. Provider   omeprazole (PRILOSEC) 40 MG capsule Take 1 capsule (40 mg total) by mouth daily. 30 capsule Hall-Potvin, [place], PA-C     PDMP not reviewed this  encounter.   Hall-Potvin, [place], PA-C 09/15/19 [place], [place], [place] 09/15/19 1514

## 2019-09-15 NOTE — Discharge Instructions (Addendum)
Eat small meals, drink water, and take tylenol for pain. We will call you with lab results tonight/tomorrow morning. Take omeprazole daily. Important to establish care with PCP for further evaluation and referrals to specialists.

## 2019-09-15 NOTE — ED Triage Notes (Signed)
Pt presents to [facility] for assessment of lower abdominal pain starting yesterday around 4 o'clock.  Patient was eating dinner.  C/o constant nausea.  Denies diarrhea.  LBM this morning.  Patient states she has had some leakage of urine since her hysterectomy 4 years ago, but the last two days if she has a strong urge to urinate she will relieve herself before she can make it to the bathroom.

## 2019-10-13 ENCOUNTER — Encounter: Payer: Self-pay | Admitting: Emergency Medicine

## 2019-10-13 ENCOUNTER — Other Ambulatory Visit: Payer: Self-pay

## 2019-10-13 ENCOUNTER — Ambulatory Visit
Admission: EM | Admit: 2019-10-13 | Discharge: 2019-10-13 | Disposition: A | Discharge status: Home / Self Care | Payer: PRIVATE HEALTH INSURANCE

## 2019-10-13 DIAGNOSIS — Z20822 Contact with and (suspected) exposure to covid-19: Secondary | ICD-10-CM

## 2019-10-13 DIAGNOSIS — J069 Acute upper respiratory infection, unspecified: Secondary | ICD-10-CM

## 2019-10-13 MED ORDER — OMEPRAZOLE 40 MG PO CPDR: 40.0000 mg | DELAYED_RELEASE_CAPSULE | Freq: Every day | ORAL | 0 refills | Status: DC

## 2019-10-13 MED ORDER — BENZONATATE 100 MG PO CAPS: 100.0000 mg | ORAL_CAPSULE | Freq: Three times a day (TID) | ORAL | 0 refills | Status: DC

## 2019-10-13 MED ORDER — CETIRIZINE HCL 10 MG PO TABS: 10.0000 mg | ORAL_TABLET | Freq: Every day | ORAL | 0 refills | Status: DC

## 2019-10-13 MED ORDER — ACETAMINOPHEN 500 MG PO TABS: 1000.0000 mg | ORAL_TABLET | Freq: Four times a day (QID) | ORAL | 0 refills | Status: DC | PRN

## 2019-10-13 MED ORDER — FLUTICASONE PROPIONATE 50 MCG/ACT NA SUSP: 1.0000 | Freq: Every day | NASAL | 0 refills | Status: DC

## 2019-10-13 NOTE — ED Provider Notes (Signed)
[facility]    CSN: [id number] Arrival date & time: 10/13/19  1116      History   Chief Complaint Chief Complaint  Patient presents with  . Cough    HPI [name] is a 44 y.o. female with history of asthma, seizures presenting for 2-day course of productive cough and malaise.  Patient endorsing chills, sore throat.  States phlegm is yellow: No blood, difficulty breathing, chest pain.  Patient has tried allergy medications, Aleve with some relief.  Patient does currently work out of the home: No sick contacts there.  Patient states her boyfriend also has a cough, though has not undergone Covid testing.  Patient has not received Covid vaccines.   Past Medical History:  Diagnosis Date  . Asthma    no current med.  . Breast cancer of upper-outer quadrant of left female breast ([place]) 07/16/2014  . Heart murmur    as a child  . History of seizures    during pregnancies; last seizure 3 yrs. ago  . Personal history of radiation therapy 2016  . Seizures (HCC)    no medications  . Stress headaches     Patient Active Problem List   Diagnosis Date Noted  . Seizure ([place]) 11/07/2016  . Left lower lobe pneumonia 11/07/2016  . Right flank pain 11/07/2016  . Anxiety 07/05/2015  . Genetic testing 08/18/2014  . Breast cancer of upper-outer quadrant of left female breast ([place]) 07/16/2014  . 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 12/20/2009  . SEIZURE DISORDER 07/07/2009  . DEPRESSION, RECURRENT 08/24/2008  . HYPERSOMNIA, IDIOPATHIC 08/24/2008  . 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 ABDOMINAL HYSTERECTOMY    . BREAST BIOPSY Left 07/14/2014   x2  . BREAST BIOPSY Left 08/21/2016  . BREAST LUMPECTOMY Left 08/06/2014  . BREAST SURGERY     lumpectomy and lymph nodes  . CESAREAN SECTION  [id number]  . CESAREAN SECTION WITH BILATERAL TUBAL LIGATION  03/03/2003  . CYSTO  12/20/2009  .  HYSTEROSCOPY W/ ENDOMETRIAL ABLATION  11/11/2007  . LYSIS OF ADHESION  12/20/2009  . TOTAL ABDOMINAL HYSTERECTOMY W/ BILATERAL SALPINGOOPHORECTOMY  12/20/2009  . TYMPANOSTOMY TUBE PLACEMENT      OB History   No obstetric history on file.      Home Medications    Prior to Admission medications   Medication Sig Start Date End Date Taking? Authorizing Provider  NON FORMULARY ALLERGY MEDICINE   Yes [provider]  acetaminophen (TYLENOL) 500 MG tablet Take 2 tablets (1,000 mg total) by mouth every 6 (six) hours as needed for mild pain. 10/13/19   Hall-Potvin, [place], PA-C  benzonatate (TESSALON) 100 MG capsule Take 1 capsule (100 mg total) by mouth every 8 (eight) hours. 10/13/19   Hall-Potvin, [place], PA-C  cetirizine (ZYRTEC ALLERGY) 10 MG tablet Take 1 tablet (10 mg total) by mouth daily. 10/13/19   Hall-Potvin, [place], PA-C  fluticasone (FLONASE) 50 MCG/ACT nasal spray Place 1 spray into both nostrils daily. 10/13/19   Hall-Potvin, [place], PA-C  omeprazole (PRILOSEC) 40 MG capsule Take 1 capsule (40 mg total) by mouth daily. 10/13/19   Hall-Potvin, [place], PA-C    Family History Family History  Problem Relation Age of Onset  . Hypertension Mother   . Breast cancer Other        MGM's sister  . Breast cancer Maternal Grandmother        dx in her 82s  Social History Social History   Tobacco Use  . Smoking status: Never Smoker  . Smokeless tobacco: Never Used  Substance Use Topics  . Alcohol use: Not Currently    Comment: occ  . Drug use: No     Allergies   Patient has no known allergies.   Review of Systems As per HPI   Physical Exam Triage Vital Signs ED Triage Vitals  Enc Vitals Group     BP      Pulse      Resp      Temp      Temp src      SpO2      Weight      Height      Head Circumference      Peak Flow      Pain Score      Pain Loc      Pain Edu?      Excl. in [place]?    No data found.  Updated Vital Signs BP 99/68 (BP  Location: Right Arm) Comment (BP Location): LARGE CUFF  Pulse 80   Temp 99.1 F (37.3 C) (Oral)   Resp (!) 24   SpO2 95%   Visual Acuity Right Eye Distance:   Left Eye Distance:   Bilateral Distance:    Right Eye Near:   Left Eye Near:    Bilateral Near:     Physical Exam Constitutional:      General: She is not in acute distress. HENT:     Head: Normocephalic and atraumatic.     Jaw: There is normal jaw occlusion. No tenderness or pain on movement.     Right Ear: Hearing, tympanic membrane, ear canal and external ear normal. No tenderness. No mastoid tenderness.     Left Ear: Hearing, tympanic membrane, ear canal and external ear normal. No tenderness. No mastoid tenderness.     Nose: No nasal deformity, septal deviation or nasal tenderness.     Right Turbinates: Not swollen or pale.     Left Turbinates: Not swollen or pale.     Right Sinus: No maxillary sinus tenderness or frontal sinus tenderness.     Left Sinus: No maxillary sinus tenderness or frontal sinus tenderness.     Mouth/Throat:     Lips: Pink. No lesions.     Mouth: Mucous membranes are moist. No injury.     Pharynx: Oropharynx is clear. Uvula midline. No posterior oropharyngeal erythema or uvula swelling.     Comments: no tonsillar exudate or hypertrophy Cardiovascular:     Rate and Rhythm: Normal rate and regular rhythm.     Heart sounds: No murmur. No gallop.   Pulmonary:     Effort: Pulmonary effort is normal. No respiratory distress.     Breath sounds: No wheezing.  Musculoskeletal:     Cervical back: Normal range of motion and neck supple. No muscular tenderness.  Lymphadenopathy:     Cervical: No cervical adenopathy.  Skin:    Capillary Refill: Capillary refill takes less than 2 seconds.  Neurological:     Mental Status: She is alert and oriented to person, place, and time.      UC Treatments / Results  Labs (all labs ordered are listed, but only abnormal results are displayed) Labs Reviewed   NOVEL CORONAVIRUS, NAA    EKG   Radiology No results found.  Procedures Procedures (including critical care time)  Medications Ordered in UC Medications - No data to display  Initial  Impression / Assessment and Plan / UC Course  I have reviewed the triage vital signs and the nursing notes.  Pertinent labs & imaging results that were available during my care of the patient were reviewed by me and considered in my medical decision making (see chart for details).     Patient afebrile, nontoxic, with SpO2 95%.  Covid PCR pending.  Patient to quarantine until results are back.  We will treat supportively as outlined below.  Patient requesting refill of Prilosec: Sent.  Return precautions discussed, patient verbalized understanding and is agreeable to plan. Final Clinical Impressions(s) / UC Diagnoses   Final diagnoses:  URI with cough and congestion     Discharge Instructions     Tessalon for cough. Start flonase, atrovent nasal spray for nasal congestion/drainage. You can use over the counter nasal saline rinse such as neti pot for nasal congestion. Keep hydrated, your urine should be clear to pale yellow in color. Tylenol/motrin for fever and pain. Monitor for any worsening of symptoms, chest pain, shortness of breath, wheezing, swelling of the throat, go to the emergency department for further evaluation needed.     ED Prescriptions    Medication Sig Dispense Auth. Provider   acetaminophen (TYLENOL) 500 MG tablet Take 2 tablets (1,000 mg total) by mouth every 6 (six) hours as needed for mild pain. 30 tablet Hall-Potvin, [place], PA-C   omeprazole (PRILOSEC) 40 MG capsule Take 1 capsule (40 mg total) by mouth daily. 30 capsule Hall-Potvin, [place], PA-C   fluticasone (FLONASE) 50 MCG/ACT nasal spray Place 1 spray into both nostrils daily. 16 g Hall-Potvin, [place], PA-C   cetirizine (ZYRTEC ALLERGY) 10 MG tablet Take 1 tablet (10 mg total) by mouth daily. 30 tablet  Hall-Potvin, [place], PA-C   benzonatate (TESSALON) 100 MG capsule Take 1 capsule (100 mg total) by mouth every 8 (eight) hours. 21 capsule Hall-Potvin, [place], PA-C     PDMP not reviewed this encounter.   Hall-Potvin, [place], [place] 10/13/19 1240

## 2019-10-13 NOTE — ED Triage Notes (Addendum)
Onset of symptoms was Saturday 4/17.  Patient having chills, cough, sore throat, reports yellow phlegm with cough.  Chest soreness with cough  No covid vaccines

## 2019-10-13 NOTE — Discharge Instructions (Signed)

## 2019-10-14 LAB — SARS-COV-2, NAA 2 DAY TAT

## 2019-10-14 LAB — NOVEL CORONAVIRUS, NAA: SARS-CoV-2, NAA: DETECTED — AB

## 2019-10-15 ENCOUNTER — Ambulatory Visit (HOSPITAL_COMMUNITY)
Admission: RE | Admit: 2019-10-15 | Discharge: 2019-10-15 | Disposition: A | Discharge status: Home / Self Care | Payer: PRIVATE HEALTH INSURANCE | Source: Ambulatory Visit | Attending: Pulmonary Disease | Admitting: Pulmonary Disease

## 2019-10-15 ENCOUNTER — Other Ambulatory Visit: Payer: Self-pay | Admitting: Nurse Practitioner

## 2019-10-15 DIAGNOSIS — C50412 Malignant neoplasm of upper-outer quadrant of left female breast: Secondary | ICD-10-CM | POA: Insufficient documentation

## 2019-10-15 DIAGNOSIS — Z6841 Body Mass Index (BMI) 40.0 and over, adult: Secondary | ICD-10-CM | POA: Diagnosis present

## 2019-10-15 DIAGNOSIS — Z170 Estrogen receptor positive status [ER+]: Secondary | ICD-10-CM | POA: Diagnosis present

## 2019-10-15 DIAGNOSIS — U071 COVID-19: Secondary | ICD-10-CM | POA: Diagnosis present

## 2019-10-15 MED ORDER — ALBUTEROL SULFATE HFA 108 (90 BASE) MCG/ACT IN AERS: 2.0000 | INHALATION_SPRAY | Freq: Once | RESPIRATORY_TRACT | Status: DC | PRN

## 2019-10-15 MED ORDER — EPINEPHRINE 0.3 MG/0.3ML IJ SOAJ: 0.3000 mg | Freq: Once | INTRAMUSCULAR | Status: DC | PRN

## 2019-10-15 MED ORDER — FAMOTIDINE IN NACL 20-0.9 MG/50ML-% IV SOLN: 20.0000 mg | Freq: Once | INTRAVENOUS | Status: DC | PRN

## 2019-10-15 MED ORDER — SODIUM CHLORIDE 0.9 % IV SOLN: INTRAVENOUS | Status: DC | PRN

## 2019-10-15 MED ORDER — DIPHENHYDRAMINE HCL 50 MG/ML IJ SOLN: 50.0000 mg | Freq: Once | INTRAMUSCULAR | Status: DC | PRN

## 2019-10-15 MED ORDER — METHYLPREDNISOLONE SODIUM SUCC 125 MG IJ SOLR: 125.0000 mg | Freq: Once | INTRAMUSCULAR | Status: DC | PRN

## 2019-10-15 MED ORDER — SODIUM CHLORIDE 0.9 % IV SOLN
Freq: Once | INTRAVENOUS | Status: AC
  Filled 2019-10-15: qty 700

## 2019-10-15 NOTE — Progress Notes (Signed)
  I connected by phone with [name] on 10/15/2019 at 9:13 AM to discuss the potential use of an new treatment for mild to moderate COVID-19 viral infection in non-hospitalized patients.  This patient is a 44 y.o. female that meets the FDA criteria for Emergency Use Authorization of bamlanivimab/etesevimab or casirivimab/imdevimab.  Has a (+) direct SARS-CoV-2 viral test result  Has mild or moderate COVID-19   Is ? 44 years of age and weighs ? 40 kg  Is NOT hospitalized due to COVID-19  Is NOT requiring oxygen therapy or requiring an increase in baseline oxygen flow rate due to COVID-19  Is within 10 days of symptom onset  Has at least one of the high risk factor(s) for progression to severe COVID-19 and/or hospitalization as defined in EUA.  Specific high risk criteria : BMI >/= 35   I have spoken and communicated the following to the patient or parent/caregiver:  1. FDA has authorized the emergency use of bamlanivimab/etesevimab and casirivimab\imdevimab for the treatment of mild to moderate COVID-19 in adults and pediatric patients with positive results of direct SARS-CoV-2 viral testing who are 22 years of age and older weighing at least 40 kg, and who are at high risk for progressing to severe COVID-19 and/or hospitalization.  2. The significant known and potential risks and benefits of bamlanivimab/etesevimab and casirivimab\imdevimab, and the extent to which such potential risks and benefits are unknown.  3. Information on available alternative treatments and the risks and benefits of those alternatives, including clinical trials.  4. Patients treated with bamlanivimab/etesevimab and casirivimab\imdevimab should continue to self-isolate and use infection control measures (e.g., wear mask, isolate, social distance, avoid sharing personal items, clean and disinfect "high touch" surfaces, and frequent handwashing) according to CDC guidelines.   5. The patient or  parent/caregiver has the option to accept or refuse bamlanivimab/etesevimab or casirivimab\imdevimab .  After reviewing this information with the patient, The patient agreed to proceed with receiving the bamlanimivab infusion and will be provided a copy of the Fact sheet prior to receiving the infusion.Fenton Foy 10/15/2019 9:13 AM

## 2019-10-15 NOTE — Discharge Instructions (Signed)

## 2019-10-23 ENCOUNTER — Telehealth (INDEPENDENT_AMBULATORY_CARE_PROVIDER_SITE_OTHER): Payer: PRIVATE HEALTH INSURANCE | Admitting: Internal Medicine

## 2019-10-23 ENCOUNTER — Encounter: Payer: Self-pay | Admitting: Internal Medicine

## 2019-10-23 DIAGNOSIS — Z853 Personal history of malignant neoplasm of breast: Secondary | ICD-10-CM | POA: Diagnosis not present

## 2019-10-23 DIAGNOSIS — Z7689 Persons encountering health services in other specified circumstances: Secondary | ICD-10-CM | POA: Diagnosis not present

## 2019-10-23 DIAGNOSIS — U071 COVID-19: Secondary | ICD-10-CM | POA: Diagnosis not present

## 2019-10-23 DIAGNOSIS — Z90710 Acquired absence of both cervix and uterus: Secondary | ICD-10-CM

## 2019-10-23 NOTE — Progress Notes (Signed)
Virtual Visit via Telephone Note  I connected with [name], on 10/23/2019 at 1:44 PM by telephone due to the COVID-19 pandemic and verified that I am speaking with the correct person using two identifiers.   Consent: I discussed the limitations, risks, security and privacy concerns of performing an evaluation and management service by telephone and the availability of in person appointments. I also discussed with the patient that there may be a patient responsible charge related to this service. The patient expressed understanding and agreed to proceed.   Location of Patient: Home   Location of Provider: Clinic    Persons participating in Telemedicine visit: [facility] Dr. Juleen China      History of Present Illness: Patient has a visit to establish care.   Patient has recent h/o testing +COVID on 4/19. She had Bamlanimivab infusion performed on 4/21. Patient still feels a little "under the weather" but is improving. She is using the Flonase, Zyrtec, and Tessalon prescribed by Urgent Care at time of diagnosis. Afebrile.   PMH significant for breast cancer, in remission. She had mammogram done in March that was negative. Has a history of total hysterectomy for AUB. Not candidate for PAPs.    Past Medical History:  Diagnosis Date  . Asthma    no current med.  . Breast cancer of upper-outer quadrant of left female breast ([place]) 07/16/2014  . Heart murmur    as a child  . History of seizures    during pregnancies; last seizure 3 yrs. ago  . Personal history of radiation therapy 2016  . Seizures (HCC)    no medications  . Stress headaches    No Known Allergies  Current Outpatient Medications on File Prior to Visit  Medication Sig Dispense Refill  . acetaminophen (TYLENOL) 500 MG tablet Take 2 tablets (1,000 mg total) by mouth every 6 (six) hours as needed for mild pain. 30 tablet 0  . benzonatate (TESSALON) 100 MG capsule Take 1 capsule (100  mg total) by mouth every 8 (eight) hours. 21 capsule 0  . cetirizine (ZYRTEC ALLERGY) 10 MG tablet Take 1 tablet (10 mg total) by mouth daily. 30 tablet 0  . fluticasone (FLONASE) 50 MCG/ACT nasal spray Place 1 spray into both nostrils daily. 16 g 0  . omeprazole (PRILOSEC) 40 MG capsule Take 1 capsule (40 mg total) by mouth daily. 30 capsule 0   No current facility-administered medications on file prior to visit.    Observations/Objective: NAD. Speaking clearly.  Work of breathing normal.  Alert and oriented. Mood appropriate.   Assessment and Plan: 1. Encounter to establish care  2. COVID-19 Improving, continue supportive care at home. Discussed appropriate time period to quarantine and reasons to seek emergency medical care. Vaccination 90 days post infection.   3. History of breast cancer In remission. Mammogram in March 2021 was negative.   4. History of total hysterectomy Will not need screening for cervical cancer. Reviewed op note from 2011 that confirmed cervix was removed.    Follow Up Instructions: Annual exam with fasting labs    I discussed the assessment and treatment plan with the patient. The patient was provided an opportunity to ask questions and all were answered. The patient agreed with the plan and demonstrated an understanding of the instructions.   The patient was advised to call back or seek an in-person evaluation if the symptoms worsen or if the condition fails to improve as anticipated.     I  provided 15 minutes total of non-face-to-face time during this encounter including median intraservice time, reviewing previous notes, investigations, ordering medications, medical decision making, coordinating care and patient verbalized understanding at the end of the visit.    Phill Myron, D.O. [facility] at [facility]  10/23/2019, 1:44 PM

## 2019-10-23 NOTE — Patient Instructions (Signed)
Thank you for choosing Primary Care at [facility] to be your medical home!    [name] was seen by Melina Schools, DO today.   [name]'s primary care provider is Phill Myron, DO.   For the best care possible, you should try to see Phill Myron, DO whenever you come to the clinic.   We look forward to seeing you again soon!  If you have any questions about your visit today, please call us at [phone] or feel free to reach your primary care provider via [facility].

## 2019-11-26 ENCOUNTER — Telehealth: Payer: Self-pay

## 2019-11-26 NOTE — Telephone Encounter (Signed)
Called patient to do their pre-visit COVID screening.  Patient states that she is unable to come to appointment & needs to reschedule. Appointment moved to 12/15/2019 @ 3:10 pm.

## 2019-11-27 ENCOUNTER — Encounter: Payer: PRIVATE HEALTH INSURANCE | Admitting: Internal Medicine

## 2019-12-12 ENCOUNTER — Telehealth: Payer: Self-pay

## 2019-12-12 NOTE — Telephone Encounter (Signed)
Called patient to do their pre-visit COVID screening.  Call went to voicemail. Unable to do prescreening.  

## 2019-12-15 ENCOUNTER — Encounter: Payer: PRIVATE HEALTH INSURANCE | Admitting: Internal Medicine

## 2020-03-28 ENCOUNTER — Emergency Department (HOSPITAL_COMMUNITY): Payer: Self-pay

## 2020-03-28 ENCOUNTER — Emergency Department (HOSPITAL_COMMUNITY)
Admission: EM | Admit: 2020-03-28 | Discharge: 2020-03-28 | Disposition: A | Discharge status: Home / Self Care | Payer: Self-pay | Attending: Emergency Medicine | Admitting: Emergency Medicine

## 2020-03-28 ENCOUNTER — Other Ambulatory Visit: Payer: Self-pay

## 2020-03-28 DIAGNOSIS — K219 Gastro-esophageal reflux disease without esophagitis: Secondary | ICD-10-CM | POA: Insufficient documentation

## 2020-03-28 DIAGNOSIS — Z853 Personal history of malignant neoplasm of breast: Secondary | ICD-10-CM | POA: Insufficient documentation

## 2020-03-28 DIAGNOSIS — J45909 Unspecified asthma, uncomplicated: Secondary | ICD-10-CM | POA: Insufficient documentation

## 2020-03-28 LAB — CBC
HCT: 41 % (ref 36.0–46.0)
Hemoglobin: 13.3 g/dL (ref 12.0–15.0)
MCH: 27.2 pg (ref 26.0–34.0)
MCHC: 32.4 g/dL (ref 30.0–36.0)
MCV: 83.8 fL (ref 80.0–100.0)
Platelets: 333 10*3/uL (ref 150–400)
RBC: 4.89 MIL/uL (ref 3.87–5.11)
RDW: 14.7 % (ref 11.5–15.5)
WBC: 7.9 10*3/uL (ref 4.0–10.5)
nRBC: 0 % (ref 0.0–0.2)

## 2020-03-28 LAB — TROPONIN I (HIGH SENSITIVITY)
Troponin I (High Sensitivity): 2 ng/L (ref <18)
Troponin I (High Sensitivity): 2 ng/L (ref <18)

## 2020-03-28 LAB — BASIC METABOLIC PANEL
Anion gap: 8 (ref 5–15)
BUN: 19 mg/dL (ref 6–20)
CO2: 27 mmol/L (ref 22–32)
Calcium: 9.4 mg/dL (ref 8.9–10.3)
Chloride: 103 mmol/L (ref 98–111)
Creatinine, Ser: 0.94 mg/dL (ref 0.44–1.00)
GFR calc Af Amer: >60 mL/min (ref >60)
GFR calc non Af Amer: >60 mL/min (ref >60)
Glucose, Bld: 94 mg/dL (ref 70–99)
Potassium: 4 mmol/L (ref 3.5–5.1)
Sodium: 138 mmol/L (ref 135–145)

## 2020-03-28 LAB — D-DIMER, QUANTITATIVE: D-Dimer, Quant: 0.34 ug/mL-FEU (ref 0.00–0.50)

## 2020-03-28 MED ORDER — ALUM & MAG HYDROXIDE-SIMETH 200-200-20 MG/5ML PO SUSP
30.0000 mL | Freq: Once | ORAL | Status: AC
  Administered 2020-03-28: 30 mL via ORAL
  Filled 2020-03-28: qty 30

## 2020-03-28 MED ORDER — LIDOCAINE VISCOUS HCL 2 % MT SOLN
15.0000 mL | Freq: Once | OROMUCOSAL | Status: AC
  Administered 2020-03-28: 15 mL via ORAL
  Filled 2020-03-28: qty 15

## 2020-03-28 NOTE — Discharge Instructions (Signed)
Your work-up today was overall reassuring.  Please make sure to follow-up with your primary care doctor about the symptoms you are experiencing today.  You may try to switch up your gastric reflux medications.  Return to the ER for any new or worsening symptoms.

## 2020-03-28 NOTE — ED Provider Notes (Signed)
[facility] DEPT Provider Note   CSN: [id number] Arrival date & time: 03/28/20  1710     History Chief Complaint  Patient presents with  . Chest Pain    [name] is a 44 y.o. female.  HPI 44 year old female with a history with a history of asthma, GERD, breast cancer, seizure disorder not on medications presents to the ER with complaints of chest pain which began earlier today.  Patient states that she does have a history of GERD, started to have central throbbing chest pain which she attributed to her GERD symptoms.  She states that she took off brand Zantac with little relief.  States she tried to drink milk to help alleviate years her symptoms but the symptoms do not go away.  She became concerned that this may be more than GERD and sought evaluation in the ER.  She denies any cough, fevers, chills, shortness of breath.  She states that she does have history of breast cancer but is currently in remission.  She denies any lower extremity swelling, any prior cardiac issues.  She denies any radiation of the chest pain, no diaphoresis.  She has some mild nausea but no vomiting.  Denies any back pain or syncope.     Past Medical History:  Diagnosis Date  . Asthma    no current med.  . Breast cancer of upper-outer quadrant of left female breast ([place]) 07/16/2014  . Heart murmur    as a child  . History of seizures    during pregnancies; last seizure 3 yrs. ago  . Personal history of radiation therapy 2016  . Seizures (HCC)    no medications  . Stress headaches     Patient Active Problem List   Diagnosis Date Noted  . History of breast cancer 10/23/2019  . Anxiety 07/05/2015  . Breast cancer of upper-outer quadrant of left female breast ([place]) 07/16/2014  . 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 12/20/2009  . SEIZURE DISORDER 07/07/2009  . DEPRESSION, RECURRENT 08/24/2008    Past Surgical History:  Procedure  Laterality Date  . ABDOMINAL HYSTERECTOMY    . BREAST BIOPSY Left 07/14/2014   x2  . BREAST BIOPSY Left 08/21/2016  . BREAST LUMPECTOMY Left 08/06/2014  . BREAST SURGERY     lumpectomy and lymph nodes  . CESAREAN SECTION  [id number]  . CESAREAN SECTION WITH BILATERAL TUBAL LIGATION  03/03/2003  . CYSTO  12/20/2009  . HYSTEROSCOPY W/ ENDOMETRIAL ABLATION  11/11/2007  . LYSIS OF ADHESION  12/20/2009  . TOTAL ABDOMINAL HYSTERECTOMY W/ BILATERAL SALPINGOOPHORECTOMY  12/20/2009  . TYMPANOSTOMY TUBE PLACEMENT       OB History   No obstetric history on file.     Family History  Problem Relation Age of Onset  . Hypertension Mother   . Breast cancer Other        MGM's sister  . Breast cancer Maternal Grandmother        dx in her 8s    Social History   Tobacco Use  . Smoking status: Never Smoker  . Smokeless tobacco: Never Used  Substance Use Topics  . Alcohol use: Not Currently    Comment: occ  . Drug use: No    Home Medications Prior to Admission medications   Medication Sig Start Date End Date Taking? Authorizing Provider  famotidine (PEPCID) 10 MG tablet Take 10 mg by mouth daily.   Yes [provider]  naproxen sodium (ALEVE) 220 MG tablet  Take 220 mg by mouth as needed (pain).   Yes [provider]  ranitidine (ZANTAC) 150 MG capsule Take 150 mg by mouth daily as needed for heartburn.   Yes [provider]    Allergies    Patient has no known allergies.  Review of Systems   Review of Systems  Constitutional: Negative for chills and fever.  HENT: Negative for ear pain and sore throat.   Eyes: Negative for pain and visual disturbance.  Respiratory: Negative for cough and shortness of breath.   Cardiovascular: Positive for chest pain. Negative for palpitations.  Gastrointestinal: Negative for abdominal pain and vomiting.  Genitourinary: Negative for dysuria and hematuria.  Musculoskeletal: Negative for arthralgias and back pain.  Skin:  Negative for color change and rash.  Neurological: Negative for seizures and syncope.  All other systems reviewed and are negative.   Physical Exam Updated Vital Signs BP 118/76   Pulse 64   Temp 97.8 F (36.6 C) (Oral)   Resp 18   Ht 5\' 3"  (1.6 m)   Wt 124.7 kg   SpO2 99%   BMI 48.71 kg/m   Physical Exam Vitals and nursing note reviewed.  Constitutional:      General: She is not in acute distress.    Appearance: She is well-developed. She is not ill-appearing, toxic-appearing or diaphoretic.  HENT:     Head: Normocephalic and atraumatic.  Eyes:     Conjunctiva/sclera: Conjunctivae normal.  Cardiovascular:     Rate and Rhythm: Normal rate and regular rhythm.     Pulses:          Radial pulses are 2+ on the right side and 1+ on the left side.     Heart sounds: Normal heart sounds. No murmur heard.   Pulmonary:     Effort: Pulmonary effort is normal. No respiratory distress.     Breath sounds: Normal breath sounds. No decreased breath sounds or wheezing.  Chest:     Chest wall: No tenderness.  Abdominal:     Palpations: Abdomen is soft.     Tenderness: There is no abdominal tenderness.  Musculoskeletal:     Cervical back: Neck supple.     Right lower leg: No tenderness. No edema.     Left lower leg: No tenderness. No edema.  Skin:    General: Skin is warm and dry.     Findings: No erythema or rash.  Neurological:     General: No focal deficit present.     Mental Status: She is alert.  Psychiatric:        Mood and Affect: Mood normal.        Behavior: Behavior normal.     ED Results / Procedures / Treatments   Labs (all labs ordered are listed, but only abnormal results are displayed) Labs Reviewed  BASIC METABOLIC PANEL  CBC  D-DIMER, QUANTITATIVE (NOT AT [facility])  TROPONIN I (HIGH SENSITIVITY)  TROPONIN I (HIGH SENSITIVITY)    EKG None  Radiology DG Chest 2 View  Result Date: 03/28/2020 CLINICAL DATA:  Chest pain and shortness of breath x2 hours.  EXAM: CHEST - 2 VIEW COMPARISON:  May 02, 2019 FINDINGS: The heart size and mediastinal contours are within normal limits. Both lungs are clear. The visualized skeletal structures are unremarkable. IMPRESSION: No active cardiopulmonary disease. Electronically Signed   By: Virgina Norfolk M.D.   On: 03/28/2020 18:20    Procedures Procedures (including critical care time)  Medications Ordered in ED Medications  alum & mag hydroxide-simeth (MAALOX/MYLANTA) 200-200-20 MG/5ML suspension 30 mL (30 mLs Oral Given 03/28/20 2046)    And  lidocaine (XYLOCAINE) 2 % viscous mouth solution 15 mL (15 mLs Oral Given 03/28/20 2046)    ED Course  I have reviewed the triage vital signs and the nursing notes.  Pertinent labs & imaging results that were available during my care of the patient were reviewed by me and considered in my medical decision making (see chart for details).    MDM Rules/Calculators/A&P                          44 year old female with complaints of chest pain which started earlier today On presentation, she is alert, oriented, nontoxic-appearing, no acute distress, speaking full sentences without increased work of breathing, nondiaphoretic.  Vitals overall reassuring.  Sickle exam with clear lung sounds, heart sounds with regular rate and rhythm, no lower extremity swelling or erythema, abdomen is soft and nontender.  Her CBC, BMP are without abnormalities.  Chest x-ray without evidence of infection, pneumothorax, mediastinal widening.  EKG with sinus bradycardia.  Given the patient's history of cancer, I did order a D-dimer which was normal.  Delta troponin of 2.  She did receive a GI cocktail here in the ED and noted significant improvement of her symptoms.  Overall work-up reassuring.  Low concern for ACS, PE, dissection, heart failure, pericarditis/myocarditis, etc.  Encouraged her to follow-up with her PCP about her symptoms.  Encouraged her to potentially switch her PPI.  She  voiced understanding and is agreeable.  Return precautions discussed.  At this stage in the ED course, the patient is medically screened and is stable for discharge. Final Clinical Impression(s) / ED Diagnoses Final diagnoses:  Gastroesophageal reflux disease, unspecified whether esophagitis present    Rx / DC Orders ED Discharge Orders    None       Lyndel Safe 03/28/20 2222    Lacretia Leigh, MD 03/31/20 [phone]

## 2020-03-28 NOTE — ED Triage Notes (Signed)
Patient reports to the ER for chest pain. Patient reports she has GERD as well and was unable to tell the difference.

## 2020-05-26 ENCOUNTER — Encounter: Payer: Self-pay | Admitting: Emergency Medicine

## 2020-05-26 ENCOUNTER — Ambulatory Visit
Admission: EM | Admit: 2020-05-26 | Discharge: 2020-05-26 | Disposition: A | Discharge status: Home / Self Care | Payer: Self-pay | Attending: Emergency Medicine | Admitting: Emergency Medicine

## 2020-05-26 DIAGNOSIS — J069 Acute upper respiratory infection, unspecified: Secondary | ICD-10-CM

## 2020-05-26 MED ORDER — FLUTICASONE PROPIONATE 50 MCG/ACT NA SUSP: 1.0000 | Freq: Every day | NASAL | 0 refills | Status: DC

## 2020-05-26 MED ORDER — BENZONATATE 200 MG PO CAPS: 200.0000 mg | ORAL_CAPSULE | Freq: Three times a day (TID) | ORAL | 0 refills | Status: AC | PRN

## 2020-05-26 NOTE — Discharge Instructions (Signed)
Covid test pending, monitor my chart for results Rest and fluids Tylenol and ibuprofen for body aches and fevers Tessalon for cough Flonase for congestion May pair with over-the-counter cetirizine/Zyrtec or loratadine/Claritin to help with further postnasal drainage Follow-up if not improving or worsening

## 2020-05-26 NOTE — ED Triage Notes (Signed)
Sore throat and cough x 2 days. A coworker tested positive for COVID

## 2020-05-26 NOTE — ED Provider Notes (Signed)
[facility]    CSN: [id number] Arrival date & time: 05/26/20  1227      History   Chief Complaint Chief Complaint  Patient presents with   Cough   Sore Throat    HPI [name] is a 44 y.o. female presenting today for evaluation of sore throat and cough.  Reports symptoms for approximately 2 days.  Has also had associated fatigue and low-grade fevers.  Reports possible exposure at work to [employer].  Mild nausea, decreased appetite.  Denies any vomiting or diarrhea.  HPI  Past Medical History:  Diagnosis Date   Asthma    no current med.   Breast cancer of upper-outer quadrant of left female breast ([place]) 07/16/2014   Heart murmur    as a child   History of seizures    during pregnancies; last seizure 3 yrs. ago   Personal history of radiation therapy 2016   Seizures ([place])    no medications   Stress headaches     Patient Active Problem List   Diagnosis Date Noted   History of breast cancer 10/23/2019   Anxiety 07/05/2015   Breast cancer of upper-outer quadrant of left female breast ([place]) 07/16/2014   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 12/20/2009   SEIZURE DISORDER 07/07/2009   DEPRESSION, RECURRENT 08/24/2008    Past Surgical History:  Procedure Laterality Date   ABDOMINAL HYSTERECTOMY     BREAST BIOPSY Left 07/14/2014   x2   BREAST BIOPSY Left 08/21/2016   BREAST LUMPECTOMY Left 08/06/2014   BREAST SURGERY     lumpectomy and lymph nodes   CESAREAN SECTION  [id number]   CESAREAN SECTION WITH BILATERAL TUBAL LIGATION  03/03/2003   CYSTO  12/20/2009   HYSTEROSCOPY W/ ENDOMETRIAL ABLATION  11/11/2007   LYSIS OF ADHESION  12/20/2009   TOTAL ABDOMINAL HYSTERECTOMY W/ BILATERAL SALPINGOOPHORECTOMY  12/20/2009   TYMPANOSTOMY TUBE PLACEMENT      OB History   No obstetric history on file.      Home Medications    Prior to Admission medications   Medication Sig Start Date End Date Taking?  Authorizing Provider  benzonatate (TESSALON) 200 MG capsule Take 1 capsule (200 mg total) by mouth 3 (three) times daily as needed for up to 7 days for cough. 05/26/20 06/02/20  Almyra Birman C, PA-C  famotidine (PEPCID) 10 MG tablet Take 10 mg by mouth daily.    [provider]  fluticasone (FLONASE) 50 MCG/ACT nasal spray Place 1-2 sprays into both nostrils daily. 05/26/20   Trellis Guirguis Ertl C, PA-C  naproxen sodium (ALEVE) 220 MG tablet Take 220 mg by mouth as needed (pain).    [provider]  ranitidine (ZANTAC) 150 MG capsule Take 150 mg by mouth daily as needed for heartburn.    [provider]    Family History Family History  Problem Relation Age of Onset   Hypertension Mother    Breast cancer Other        MGM's sister   Breast cancer Maternal Grandmother        dx in her 50s    Social History Social History   Tobacco Use   Smoking status: Never Smoker   Smokeless tobacco: Never Used  Substance Use Topics   Alcohol use: Not Currently    Comment: occ   Drug use: No     Allergies   Patient has no known allergies.   Review of Systems Review of Systems  Constitutional: Positive for  fatigue and fever. Negative for activity change, appetite change and chills.  HENT: Positive for congestion and sore throat. Negative for ear pain, rhinorrhea, sinus pressure and trouble swallowing.   Eyes: Negative for discharge and redness.  Respiratory: Positive for cough. Negative for chest tightness and shortness of breath.   Cardiovascular: Negative for chest pain.  Gastrointestinal: Negative for abdominal pain, diarrhea, nausea and vomiting.  Musculoskeletal: Negative for myalgias.  Skin: Negative for rash.  Neurological: Negative for dizziness, light-headedness and headaches.     Physical Exam Triage Vital Signs ED Triage Vitals  Enc Vitals Group     BP 05/26/20 1324 123/77     Pulse Rate 05/26/20 1324 70     Resp 05/26/20 1324 20     Temp  05/26/20 1324 98.3 F (36.8 C)     Temp Source 05/26/20 1324 Oral     SpO2 05/26/20 1324 98 %     Weight --      Height --      Head Circumference --      Peak Flow --      Pain Score 05/26/20 1323 3     Pain Loc --      Pain Edu? --      Excl. in [place]? --    No data found.  Updated Vital Signs BP 123/77 (BP Location: Left Arm)    Pulse 70    Temp 98.3 F (36.8 C) (Oral)    Resp 20    SpO2 98%   Visual Acuity Right Eye Distance:   Left Eye Distance:   Bilateral Distance:    Right Eye Near:   Left Eye Near:    Bilateral Near:     Physical Exam Vitals and nursing note reviewed.  Constitutional:      Appearance: She is well-developed.     Comments: No acute distress  HENT:     Head: Normocephalic and atraumatic.     Ears:     Comments: Bilateral ears without tenderness to palpation of external auricle, tragus and mastoid, EAC's without erythema or swelling, TM's with good bony landmarks and cone of light. Non erythematous.     Nose: Nose normal.     Mouth/Throat:     Comments: Oral mucosa pink and moist, no tonsillar enlargement or exudate. Posterior pharynx patent and nonerythematous, no uvula deviation or swelling. Normal phonation. Eyes:     Conjunctiva/sclera: Conjunctivae normal.  Cardiovascular:     Rate and Rhythm: Normal rate.  Pulmonary:     Effort: Pulmonary effort is normal. No respiratory distress.     Comments: Breathing comfortably at rest, CTABL, no wheezing, rales or other adventitious sounds auscultated Abdominal:     General: There is no distension.  Musculoskeletal:        General: Normal range of motion.     Cervical back: Neck supple.  Skin:    General: Skin is warm and dry.  Neurological:     Mental Status: She is alert and oriented to person, place, and time.      UC Treatments / Results  Labs (all labs ordered are listed, but only abnormal results are displayed) Labs Reviewed  NOVEL CORONAVIRUS, NAA    EKG   Radiology No  results found.  Procedures Procedures (including critical care time)  Medications Ordered in UC Medications - No data to display  Initial Impression / Assessment and Plan / UC Course  I have reviewed the triage vital signs and the nursing notes.  Pertinent labs & imaging results that were available during my care of the patient were reviewed by me and considered in my medical decision making (see chart for details).     Viral URI with cough, Covid test pending, exam reassuring.  Recommending symptomatic and supportive care with continued close monitoring.  Rest and fluids. Discussed strict return precautions. Patient verbalized understanding and is agreeable with plan.   Final Clinical Impressions(s) / UC Diagnoses   Final diagnoses:  Viral URI with cough     Discharge Instructions     Covid test pending, monitor my chart for results Rest and fluids Tylenol and ibuprofen for body aches and fevers Tessalon for cough Flonase for congestion May pair with over-the-counter cetirizine/Zyrtec or loratadine/Claritin to help with further postnasal drainage Follow-up if not improving or worsening   ED Prescriptions    Medication Sig Dispense Auth. Provider   benzonatate (TESSALON) 200 MG capsule Take 1 capsule (200 mg total) by mouth 3 (three) times daily as needed for up to 7 days for cough. 28 capsule Jonice Cerra C, PA-C   fluticasone (FLONASE) 50 MCG/ACT nasal spray Place 1-2 sprays into both nostrils daily. 16 g Sharvil Hoey, [place] C, PA-C     PDMP not reviewed this encounter.   Janith Lima, PA-C 05/26/20 1427

## 2020-05-27 LAB — SARS-COV-2, NAA 2 DAY TAT

## 2020-05-27 LAB — NOVEL CORONAVIRUS, NAA: SARS-CoV-2, NAA: NOT DETECTED

## 2020-06-06 ENCOUNTER — Other Ambulatory Visit: Payer: Self-pay

## 2020-06-06 ENCOUNTER — Ambulatory Visit
Admission: EM | Admit: 2020-06-06 | Discharge: 2020-06-06 | Disposition: A | Discharge status: Home / Self Care | Payer: Self-pay | Attending: Emergency Medicine | Admitting: Emergency Medicine

## 2020-06-06 ENCOUNTER — Ambulatory Visit (INDEPENDENT_AMBULATORY_CARE_PROVIDER_SITE_OTHER): Payer: Self-pay

## 2020-06-06 DIAGNOSIS — J22 Unspecified acute lower respiratory infection: Secondary | ICD-10-CM

## 2020-06-06 DIAGNOSIS — R059 Cough, unspecified: Secondary | ICD-10-CM

## 2020-06-06 MED ORDER — GUAIFENESIN-CODEINE 100-10 MG/5ML PO SOLN: 5.0000 mL | Freq: Three times a day (TID) | ORAL | 0 refills | Status: DC | PRN

## 2020-06-06 MED ORDER — AZITHROMYCIN 250 MG PO TABS: 250.0000 mg | ORAL_TABLET | Freq: Every day | ORAL | 0 refills | Status: DC

## 2020-06-06 MED ORDER — AMOXICILLIN-POT CLAVULANATE 875-125 MG PO TABS: 1.0000 | ORAL_TABLET | Freq: Two times a day (BID) | ORAL | 0 refills | Status: DC

## 2020-06-06 MED ORDER — ALBUTEROL SULFATE HFA 108 (90 BASE) MCG/ACT IN AERS: 1.0000 | INHALATION_SPRAY | Freq: Four times a day (QID) | RESPIRATORY_TRACT | 0 refills | Status: DC | PRN

## 2020-06-06 MED ORDER — PREDNISONE 20 MG PO TABS: 40.0000 mg | ORAL_TABLET | Freq: Every day | ORAL | 0 refills | Status: AC

## 2020-06-06 NOTE — ED Provider Notes (Signed)
[facility]    CSN: [id number] Arrival date & time: 06/06/20  1439      History   Chief Complaint Chief Complaint  Patient presents with  . Cough  . Abdominal Pain  . Back Pain    HPI [name] is a 44 y.o. female history of asthma presenting today for evaluation of URI symptoms.  Patient reports that she has had cough, back pain, abdominal pain and vomiting.  Patient was seen here approximately 10 days ago when symptoms initially started, but symptoms have worsened since.  Covid test was negative at the time.  Reports episodes of posttussive emesis.  HPI  Past Medical History:  Diagnosis Date  . Asthma    no current med.  . Breast cancer of upper-outer quadrant of left female breast ([place]) 07/16/2014  . Heart murmur    as a child  . History of seizures    during pregnancies; last seizure 3 yrs. ago  . Personal history of radiation therapy 2016  . Seizures (H[facility])    no medications  . Stress headaches     Patient Active Problem List   Diagnosis Date Noted  . History of breast cancer 10/23/2019  . Anxiety 07/05/2015  . Breast cancer of upper-outer quadrant of left female breast ([place]) 07/16/2014  . 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 12/20/2009  . SEIZURE DISORDER 07/07/2009  . DEPRESSION, RECURRENT 08/24/2008    Past Surgical History:  Procedure Laterality Date  . ABDOMINAL HYSTERECTOMY    . BREAST BIOPSY Left 07/14/2014   x2  . BREAST BIOPSY Left 08/21/2016  . BREAST LUMPECTOMY Left 08/06/2014  . BREAST SURGERY     lumpectomy and lymph nodes  . CESAREAN SECTION  [id number]  . CESAREAN SECTION WITH BILATERAL TUBAL LIGATION  03/03/2003  . CYSTO  12/20/2009  . HYSTEROSCOPY W/ ENDOMETRIAL ABLATION  11/11/2007  . LYSIS OF ADHESION  12/20/2009  . TOTAL ABDOMINAL HYSTERECTOMY W/ BILATERAL SALPINGOOPHORECTOMY  12/20/2009  . TYMPANOSTOMY TUBE PLACEMENT      OB History   No obstetric history on file.      Home  Medications    Prior to Admission medications   Medication Sig Start Date End Date Taking? Authorizing Provider  albuterol (VENTOLIN HFA) 108 (90 Base) MCG/ACT inhaler Inhale 1-2 puffs into the lungs every 6 (six) hours as needed for wheezing or shortness of breath. 06/06/20   Keonta Alsip C, PA-C  amoxicillin-clavulanate (AUGMENTIN) 875-125 MG tablet Take 1 tablet by mouth every 12 (twelve) hours. 06/06/20   Sharlena Kristensen C, PA-C  azithromycin (ZITHROMAX) 250 MG tablet Take 1 tablet (250 mg total) by mouth daily. Take first 2 tablets together, then 1 every day until finished. 06/06/20   Ixchel Duck C, PA-C  famotidine (PEPCID) 10 MG tablet Take 10 mg by mouth daily.    [provider]  guaiFENesin-codeine 100-10 MG/5ML syrup Take 5-10 mLs by mouth 3 (three) times daily as needed for cough. 06/06/20   Love Chowning C, PA-C  naproxen sodium (ALEVE) 220 MG tablet Take 220 mg by mouth as needed (pain).    [provider]  predniSONE (DELTASONE) 20 MG tablet Take 2 tablets (40 mg total) by mouth daily for 5 days. 06/06/20 06/11/20  Heaven Meeker C, PA-C  ranitidine (ZANTAC) 150 MG capsule Take 150 mg by mouth daily as needed for heartburn.    [provider]  fluticasone (FLONASE) 50 MCG/ACT nasal spray Place 1-2 sprays into both nostrils daily. 05/26/20 06/06/20  Geremiah Fussell, [place] C, PA-C    Family History Family History  Problem Relation Age of Onset  . Hypertension Mother   . Breast cancer Other        MGM's sister  . Breast cancer Maternal Grandmother        dx in her 102s    Social History Social History   Tobacco Use  . Smoking status: Never Smoker  . Smokeless tobacco: Never Used  Substance Use Topics  . Alcohol use: Not Currently    Comment: occ  . Drug use: No     Allergies   Patient has no known allergies.   Review of Systems Review of Systems  Constitutional: Negative for activity change, appetite change, chills, fatigue and  fever.  HENT: Positive for congestion, rhinorrhea, sinus pressure and sore throat. Negative for ear pain and trouble swallowing.   Eyes: Negative for discharge and redness.  Respiratory: Positive for cough. Negative for chest tightness and shortness of breath.   Cardiovascular: Negative for chest pain.  Gastrointestinal: Positive for abdominal pain and vomiting. Negative for diarrhea and nausea.  Musculoskeletal: Negative for myalgias.  Skin: Negative for rash.  Neurological: Negative for dizziness, light-headedness and headaches.     Physical Exam Triage Vital Signs ED Triage Vitals  Enc Vitals Group     BP      Pulse      Resp      Temp      Temp src      SpO2      Weight      Height      Head Circumference      Peak Flow      Pain Score      Pain Loc      Pain Edu?      Excl. in [place]?    No data found.  Updated Vital Signs BP 134/87 (BP Location: Right Arm)   Pulse 77   Temp 98.1 F (36.7 C) (Oral)   Resp 16   SpO2 98%   Visual Acuity Right Eye Distance:   Left Eye Distance:   Bilateral Distance:    Right Eye Near:   Left Eye Near:    Bilateral Near:     Physical Exam Vitals and nursing note reviewed.  Constitutional:      Appearance: She is well-developed and well-nourished.     Comments: No acute distress  HENT:     Head: Normocephalic and atraumatic.     Ears:     Comments: Bilateral ears without tenderness to palpation of external auricle, tragus and mastoid, EAC's without erythema or swelling, TM's with good bony landmarks and cone of light. Non erythematous.     Nose: Nose normal.     Mouth/Throat:     Comments: Oral mucosa pink and moist, no tonsillar enlargement or exudate. Posterior pharynx patent and nonerythematous, no uvula deviation or swelling. Normal phonation. Eyes:     Conjunctiva/sclera: Conjunctivae normal.  Cardiovascular:     Rate and Rhythm: Normal rate and regular rhythm.  Pulmonary:     Effort: Pulmonary effort is normal. No  respiratory distress.     Comments: Frequent coarse coughing, cannot take deep inspiration without coughing, bronchospasm noted with coughing, no specific rales auscultated Abdominal:     General: There is no distension.  Musculoskeletal:        General: Normal range of motion.     Cervical back: Neck supple.  Skin:    General: Skin is warm  and dry.  Neurological:     Mental Status: She is alert and oriented to person, place, and time.  Psychiatric:        Mood and Affect: Mood and affect normal.      UC Treatments / Results  Labs (all labs ordered are listed, but only abnormal results are displayed) Labs Reviewed  NOVEL CORONAVIRUS, NAA    EKG   Radiology DG Chest 2 View  Result Date: 06/06/2020 CLINICAL DATA:  Productive cough w/thick yellow sputum worse when laying down, chest congestion x 2 weeks. Hx of asthma. Nonsmoker. EXAM: CHEST - 2 VIEW COMPARISON:  Chest radiograph 05/02/2019 FINDINGS: Stable cardiomediastinal contours. The lungs are clear. No pneumothorax or pleural effusion. No acute finding in the visualized skeleton. IMPRESSION: No acute cardiopulmonary process. Electronically Signed   By: Audie Pinto M.D.   On: 06/06/2020 15:57    Procedures Procedures (including critical care time)  Medications Ordered in UC Medications - No data to display  Initial Impression / Assessment and Plan / UC Course  I have reviewed the triage vital signs and the nursing notes.  Pertinent labs & imaging results that were available during my care of the patient were reviewed by me and considered in my medical decision making (see chart for details).     Areas of more prominent noted in left upper lung field as well as right lower lung, radiology reporting no signs of pneumonia, did opt to go him proceed with antibiotic therapy based off symptoms and length of illness.  Continue symptomatic and supportive care as well.  Discussed strict return precautions. Patient  verbalized understanding and is agreeable with plan.  Final Clinical Impressions(s) / UC Diagnoses   Final diagnoses:  Lower respiratory infection (e.g., bronchitis, pneumonia, pneumonitis, pulmonitis)     Discharge Instructions     Begin Augmentin twice daily for 1 week Begin azithromycin-2 tablets today, 1 tablet for the following 4 days Robitussin with codeine as needed for cough prednisone daily for 5 days Albuterol inhaler as needed for shortness of breath wheezing, chest tightness     ED Prescriptions    Medication Sig Dispense Auth. Provider   amoxicillin-clavulanate (AUGMENTIN) 875-125 MG tablet Take 1 tablet by mouth every 12 (twelve) hours. 14 tablet Lachrisha Ziebarth C, PA-C   azithromycin (ZITHROMAX) 250 MG tablet Take 1 tablet (250 mg total) by mouth daily. Take first 2 tablets together, then 1 every day until finished. 6 tablet Daisa Stennis C, PA-C   guaiFENesin-codeine 100-10 MG/5ML syrup Take 5-10 mLs by mouth 3 (three) times daily as needed for cough. 120 mL Laira Penninger C, PA-C   predniSONE (DELTASONE) 20 MG tablet Take 2 tablets (40 mg total) by mouth daily for 5 days. 10 tablet Drue Harr C, PA-C   albuterol (VENTOLIN HFA) 108 (90 Base) MCG/ACT inhaler Inhale 1-2 puffs into the lungs every 6 (six) hours as needed for wheezing or shortness of breath. 18 g Tripton Ned, [place] C, PA-C     PDMP not reviewed this encounter.   Janith Lima, PA-C 06/07/20 1103

## 2020-06-06 NOTE — ED Triage Notes (Signed)
Patient presents to Urgent Care with complaints of cough and abdominal pain that started since 11/28. She reports lower back pain thought related to constipation so took a stool softner. She also states she tested negative for covid on 12/1. Treating symptoms with OTC cold/cough medications.   Denies fever,vomiting, diarrhea.

## 2020-06-06 NOTE — Discharge Instructions (Addendum)
Begin Augmentin twice daily for 1 week Begin azithromycin-2 tablets today, 1 tablet for the following 4 days Robitussin with codeine as needed for cough prednisone daily for 5 days Albuterol inhaler as needed for shortness of breath wheezing, chest tightness

## 2020-06-08 LAB — SARS-COV-2, NAA 2 DAY TAT

## 2020-06-08 LAB — NOVEL CORONAVIRUS, NAA: SARS-CoV-2, NAA: NOT DETECTED

## 2020-07-23 ENCOUNTER — Other Ambulatory Visit: Payer: Self-pay

## 2020-07-23 DIAGNOSIS — Z20822 Contact with and (suspected) exposure to covid-19: Secondary | ICD-10-CM

## 2020-07-25 LAB — NOVEL CORONAVIRUS, NAA: SARS-CoV-2, NAA: DETECTED — AB

## 2020-07-25 LAB — SARS-COV-2, NAA 2 DAY TAT

## 2020-07-26 ENCOUNTER — Telehealth: Payer: Self-pay

## 2020-07-26 NOTE — Telephone Encounter (Signed)
Pt informed of results/advised via MyChart by [facility] RN.

## 2020-10-04 ENCOUNTER — Other Ambulatory Visit: Payer: Self-pay | Admitting: Hematology and Oncology

## 2020-10-04 DIAGNOSIS — Z1231 Encounter for screening mammogram for malignant neoplasm of breast: Secondary | ICD-10-CM

## 2020-10-05 ENCOUNTER — Encounter: Payer: Self-pay | Admitting: Physician Assistant

## 2020-10-05 ENCOUNTER — Telehealth: Payer: Self-pay | Admitting: Physician Assistant

## 2020-10-05 DIAGNOSIS — B001 Herpesviral vesicular dermatitis: Secondary | ICD-10-CM

## 2020-10-05 MED ORDER — VALACYCLOVIR HCL 1 G PO TABS: 2000.0000 mg | ORAL_TABLET | Freq: Two times a day (BID) | ORAL | 0 refills | Status: DC

## 2020-10-05 NOTE — Progress Notes (Signed)

## 2020-10-05 NOTE — Progress Notes (Signed)
Duplicate encounter

## 2020-10-05 NOTE — Progress Notes (Signed)
I have spent 5 minutes in review of e-visit questionnaire, review and updating patient chart, medical decision making and response to patient.   Abiola Behring Cody Lawton Dollinger, PA-C    

## 2020-11-25 ENCOUNTER — Ambulatory Visit
Admission: RE | Admit: 2020-11-25 | Discharge: 2020-11-25 | Disposition: A | Discharge status: Home / Self Care | Payer: Managed Care, Other (non HMO) | Source: Ambulatory Visit | Attending: Hematology and Oncology | Admitting: Hematology and Oncology

## 2020-11-25 ENCOUNTER — Other Ambulatory Visit: Payer: Self-pay

## 2020-11-25 DIAGNOSIS — Z1231 Encounter for screening mammogram for malignant neoplasm of breast: Secondary | ICD-10-CM

## 2020-12-15 ENCOUNTER — Ambulatory Visit
Admission: EM | Admit: 2020-12-15 | Discharge: 2020-12-15 | Disposition: A | Discharge status: Home / Self Care | Payer: Managed Care, Other (non HMO)

## 2020-12-15 ENCOUNTER — Other Ambulatory Visit: Payer: Self-pay

## 2020-12-15 ENCOUNTER — Encounter: Payer: Self-pay | Admitting: *Deleted

## 2020-12-15 DIAGNOSIS — J069 Acute upper respiratory infection, unspecified: Secondary | ICD-10-CM

## 2020-12-15 DIAGNOSIS — H66001 Acute suppurative otitis media without spontaneous rupture of ear drum, right ear: Secondary | ICD-10-CM | POA: Diagnosis not present

## 2020-12-15 DIAGNOSIS — Z20822 Contact with and (suspected) exposure to covid-19: Secondary | ICD-10-CM | POA: Diagnosis not present

## 2020-12-15 MED ORDER — AMOXICILLIN 875 MG PO TABS: 875.0000 mg | ORAL_TABLET | Freq: Two times a day (BID) | ORAL | 0 refills | Status: DC

## 2020-12-15 MED ORDER — PROMETHAZINE-DM 6.25-15 MG/5ML PO SYRP: 5.0000 mL | ORAL_SOLUTION | Freq: Four times a day (QID) | ORAL | 0 refills | Status: DC | PRN

## 2020-12-15 NOTE — ED Triage Notes (Signed)
C/O sore throat, right ear pain, cough, HA, generalized malaise.  No known fevers.

## 2020-12-15 NOTE — ED Provider Notes (Signed)
[facility]    CSN: [id number] Arrival date & time: 12/15/20  1350      History   Chief Complaint No chief complaint on file.   HPI [name] is a 45 y.o. female.   Patient presenting today with 2-day history of sore throat, right ear pain, muffled hearing, headache, malaise, cough, body aches.  Denies chest pain, shortness of breath, headaches, abdominal pain, nausea vomiting or diarrhea.  So far taking over-the-counter cold and congestion medications with minimal relief.  No known recent sick contacts.  No known history of pulmonary disease.   Past Medical History:  Diagnosis Date   Asthma    no current med.   Breast cancer of upper-outer quadrant of left female breast ([place]) 07/16/2014   Heart murmur    as a child   History of seizures    during pregnancies; last seizure 3 yrs. ago   Personal history of radiation therapy 2016   Seizures ([place])    no medications   Stress headaches     Patient Active Problem List   Diagnosis Date Noted   History of breast cancer 10/23/2019   Anxiety 07/05/2015   Breast cancer of upper-outer quadrant of left female breast ([place]) 07/16/2014   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 12/20/2009   SEIZURE DISORDER 07/07/2009   DEPRESSION, RECURRENT 08/24/2008    Past Surgical History:  Procedure Laterality Date   ABDOMINAL HYSTERECTOMY     BREAST BIOPSY Left 07/14/2014   x2   BREAST BIOPSY Left 08/21/2016   BREAST LUMPECTOMY Left 08/06/2014   BREAST SURGERY     lumpectomy and lymph nodes   CESAREAN SECTION  9767,3419   CESAREAN SECTION WITH BILATERAL TUBAL LIGATION  03/03/2003   CYSTO  12/20/2009   HYSTEROSCOPY W/ ENDOMETRIAL ABLATION  11/11/2007   LYSIS OF ADHESION  12/20/2009   TOTAL ABDOMINAL HYSTERECTOMY W/ BILATERAL SALPINGOOPHORECTOMY  12/20/2009   TYMPANOSTOMY TUBE PLACEMENT      OB History   No obstetric history on file.      Home Medications    Prior to Admission medications    Medication Sig Start Date End Date Taking? Authorizing Provider  amoxicillin (AMOXIL) 875 MG tablet Take 1 tablet (875 mg total) by mouth 2 (two) times daily. 12/15/20  Yes Volney American, PA-C  Phenylephrine-DM-GG-APAP (TYLENOL COLD/FLU SEVERE PO) Take by mouth.   Yes [provider]  promethazine-dextromethorphan (PROMETHAZINE-DM) 6.25-15 MG/5ML syrup Take 5 mLs by mouth 4 (four) times daily as needed for cough. 12/15/20  Yes Volney American, PA-C  Pseudoeph-Doxylamine-DM-APAP (NYQUIL PO) Take by mouth.   Yes [provider]  Pseudoephedrine-APAP-DM (DAYQUIL PO) Take by mouth.   Yes [provider]  albuterol (VENTOLIN HFA) 108 (90 Base) MCG/ACT inhaler Inhale 1-2 puffs into the lungs every 6 (six) hours as needed for wheezing or shortness of breath. 06/06/20   Wieters, Hallie C, PA-C  famotidine (PEPCID) 10 MG tablet Take 10 mg by mouth daily.    [provider]  guaiFENesin-codeine 100-10 MG/5ML syrup Take 5-10 mLs by mouth 3 (three) times daily as needed for cough. 06/06/20   Wieters, Hallie C, PA-C  naproxen sodium (ALEVE) 220 MG tablet Take 220 mg by mouth as needed (pain).    [provider]  ranitidine (ZANTAC) 150 MG capsule Take 150 mg by mouth daily as needed for heartburn.    [provider]  valACYclovir (VALTREX) 1000 MG tablet Take 2 tablets (2,000 mg total) by mouth  2 (two) times daily. 10/05/20   Brunetta Jeans, PA-C  fluticasone (FLONASE) 50 MCG/ACT nasal spray Place 1-2 sprays into both nostrils daily. 05/26/20 06/06/20  Wieters, Elesa Hacker, PA-C    Family History Family History  Problem Relation Age of Onset   Hypertension Mother    Breast cancer Other        MGM's sister   Breast cancer Maternal Grandmother        dx in her 87s    Social History Social History   Tobacco Use   Smoking status: Never   Smokeless tobacco: Never  Vaping Use   Vaping Use: Never used  Substance Use Topics   Alcohol  use: Not Currently    Comment: rarely   Drug use: No     Allergies   Patient has no known allergies.   Review of Systems Review of Systems Per HPI  Physical Exam Triage Vital Signs ED Triage Vitals  Enc Vitals Group     BP 12/15/20 1539 124/80     Pulse Rate 12/15/20 1539 (!) 58     Resp 12/15/20 1539 18     Temp 12/15/20 1539 98.2 F (36.8 C)     Temp Source 12/15/20 1539 Temporal     SpO2 12/15/20 1539 98 %     Weight --      Height --      Head Circumference --      Peak Flow --      Pain Score 12/15/20 1541 8     Pain Loc --      Pain Edu? --      Excl. in [place]? --    No data found.  Updated Vital Signs BP 124/80   Pulse (!) 58   Temp 98.2 F (36.8 C) (Temporal)   Resp 18   SpO2 98%   Visual Acuity Right Eye Distance:   Left Eye Distance:   Bilateral Distance:    Right Eye Near:   Left Eye Near:    Bilateral Near:     Physical Exam Vitals and nursing note reviewed.  Constitutional:      Appearance: Normal appearance. She is not ill-appearing.  HENT:     Head: Atraumatic.     Right Ear: Tympanic membrane normal.     Left Ear: Tympanic membrane normal.     Nose: Rhinorrhea present.     Mouth/Throat:     Pharynx: Posterior oropharyngeal erythema present. No oropharyngeal exudate.  Eyes:     Extraocular Movements: Extraocular movements intact.     Conjunctiva/sclera: Conjunctivae normal.  Cardiovascular:     Rate and Rhythm: Normal rate and regular rhythm.     Heart sounds: Normal heart sounds.  Pulmonary:     Effort: Pulmonary effort is normal. No respiratory distress.     Breath sounds: Normal breath sounds. No wheezing or rales.  Abdominal:     General: Bowel sounds are normal. There is no distension.     Palpations: Abdomen is soft.     Tenderness: There is no abdominal tenderness. There is no guarding.  Musculoskeletal:        General: Normal range of motion.     Cervical back: Normal range of motion and neck supple.  Skin:     General: Skin is warm and dry.  Neurological:     Mental Status: She is alert and oriented to person, place, and time.  Psychiatric:        Mood and Affect: Mood  normal.        Thought Content: Thought content normal.        Judgment: Judgment normal.     UC Treatments / Results  Labs (all labs ordered are listed, but only abnormal results are displayed) Labs Reviewed  NOVEL CORONAVIRUS, NAA    EKG   Radiology No results found.  Procedures Procedures (including critical care time)  Medications Ordered in UC Medications - No data to display  Initial Impression / Assessment and Plan / UC Course  I have reviewed the triage vital signs and the nursing notes.  Pertinent labs & imaging results that were available during my care of the patient were reviewed by me and considered in my medical decision making (see chart for details).     Vitals and exam overall reassuring today, COVID PCR pending.  Will treat with amoxicillin for right otitis media, Phenergan DM for cough suppression, over-the-counter medications and supportive home care reviewed.  Work note given with Journalist, [employer].  Return for acutely worsening symptoms at any time.  Final Clinical Impressions(s) / UC Diagnoses   Final diagnoses:  Encounter for laboratory testing for COVID-19 virus  Viral URI with cough  Non-recurrent acute suppurative otitis media of right ear without spontaneous rupture of tympanic membrane   Discharge Instructions   None    ED Prescriptions     Medication Sig Dispense Auth. Provider   amoxicillin (AMOXIL) 875 MG tablet Take 1 tablet (875 mg total) by mouth 2 (two) times daily. 20 tablet Volney American, [place]   promethazine-dextromethorphan (PROMETHAZINE-DM) 6.25-15 MG/5ML syrup Take 5 mLs by mouth 4 (four) times daily as needed for cough. 100 mL Volney American, [place]      PDMP not reviewed this encounter.   Volney American, [place] 12/15/20 1629

## 2020-12-16 LAB — SARS-COV-2, NAA 2 DAY TAT

## 2020-12-16 LAB — NOVEL CORONAVIRUS, NAA: SARS-CoV-2, NAA: NOT DETECTED

## 2021-01-10 ENCOUNTER — Ambulatory Visit
Admission: EM | Admit: 2021-01-10 | Discharge: 2021-01-10 | Disposition: A | Discharge status: Home / Self Care | Payer: Managed Care, Other (non HMO) | Attending: Physician Assistant | Admitting: Physician Assistant

## 2021-01-10 ENCOUNTER — Encounter: Payer: Self-pay | Admitting: Emergency Medicine

## 2021-01-10 ENCOUNTER — Other Ambulatory Visit: Payer: Self-pay

## 2021-01-10 DIAGNOSIS — H6690 Otitis media, unspecified, unspecified ear: Secondary | ICD-10-CM

## 2021-01-10 MED ORDER — CEFDINIR 300 MG PO CAPS: 300.0000 mg | ORAL_CAPSULE | Freq: Two times a day (BID) | ORAL | 0 refills | Status: DC

## 2021-01-10 NOTE — ED Provider Notes (Signed)
[facility]    CSN: [id number] Arrival date & time: 01/10/21  1432      History   Chief Complaint Chief Complaint  Patient presents with   Otalgia    HPI [name] is a 45 y.o. female.   Pt complains of pain in her right ear.  Pt has been on amoxicillin without relief.  Pt also has a mole on her back that is bothering her.   The history is provided by the patient. No language interpreter was used.  Otalgia Location:  Right Behind ear:  Redness Quality:  Aching Severity:  Moderate Onset quality:  Gradual Timing:  Constant Progression:  Worsening Chronicity:  New Relieved by:  Nothing Worsened by:  Nothing Ineffective treatments:  None tried Risk factors: chronic ear infection    Past Medical History:  Diagnosis Date   Asthma    no current med.   Breast cancer of upper-outer quadrant of left female breast ([place]) 07/16/2014   Heart murmur    as a child   History of seizures    during pregnancies; last seizure 3 yrs. ago   Personal history of radiation therapy 2016   Seizures ([place])    no medications   Stress headaches     Patient Active Problem List   Diagnosis Date Noted   History of breast cancer 10/23/2019   Anxiety 07/05/2015   Breast cancer of upper-outer quadrant of left female breast ([place]) 07/16/2014   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 12/20/2009   SEIZURE DISORDER 07/07/2009   DEPRESSION, RECURRENT 08/24/2008    Past Surgical History:  Procedure Laterality Date   ABDOMINAL HYSTERECTOMY     BREAST BIOPSY Left 07/14/2014   x2   BREAST BIOPSY Left 08/21/2016   BREAST LUMPECTOMY Left 08/06/2014   BREAST SURGERY     lumpectomy and lymph nodes   CESAREAN SECTION  3267,1245   CESAREAN SECTION WITH BILATERAL TUBAL LIGATION  03/03/2003   CYSTO  12/20/2009   HYSTEROSCOPY W/ ENDOMETRIAL ABLATION  11/11/2007   LYSIS OF ADHESION  12/20/2009   TOTAL ABDOMINAL HYSTERECTOMY W/ BILATERAL SALPINGOOPHORECTOMY   12/20/2009   TYMPANOSTOMY TUBE PLACEMENT      OB History   No obstetric history on file.      Home Medications    Prior to Admission medications   Medication Sig Start Date End Date Taking? Authorizing Provider  cefdinir (OMNICEF) 300 MG capsule Take 1 capsule (300 mg total) by mouth 2 (two) times daily. 01/10/21  Yes Caryl Ada K, PA-C  albuterol (VENTOLIN HFA) 108 (90 Base) MCG/ACT inhaler Inhale 1-2 puffs into the lungs every 6 (six) hours as needed for wheezing or shortness of breath. 06/06/20   Wieters, Hallie C, PA-C  amoxicillin (AMOXIL) 875 MG tablet Take 1 tablet (875 mg total) by mouth 2 (two) times daily. Patient not taking: Reported on 01/10/2021 12/15/20   Volney American, PA-C  famotidine (PEPCID) 10 MG tablet Take 10 mg by mouth daily.    [provider]  guaiFENesin-codeine 100-10 MG/5ML syrup Take 5-10 mLs by mouth 3 (three) times daily as needed for cough. 06/06/20   Wieters, Hallie C, PA-C  naproxen sodium (ALEVE) 220 MG tablet Take 220 mg by mouth as needed (pain).    [provider]  Phenylephrine-DM-GG-APAP (TYLENOL COLD/FLU SEVERE PO) Take by mouth.    [provider]  promethazine-dextromethorphan (PROMETHAZINE-DM) 6.25-15 MG/5ML syrup Take 5 mLs by mouth 4 (four) times daily as needed for cough. 12/15/20  Volney American, PA-C  Pseudoeph-Doxylamine-DM-APAP (NYQUIL PO) Take by mouth.    [provider]  Pseudoephedrine-APAP-DM (DAYQUIL PO) Take by mouth.    [provider]  ranitidine (ZANTAC) 150 MG capsule Take 150 mg by mouth daily as needed for heartburn.    [provider]  valACYclovir (VALTREX) 1000 MG tablet Take 2 tablets (2,000 mg total) by mouth 2 (two) times daily. 10/05/20   Brunetta Jeans, PA-C  fluticasone (FLONASE) 50 MCG/ACT nasal spray Place 1-2 sprays into both nostrils daily. 05/26/20 06/06/20  Wieters, Elesa Hacker, PA-C    Family History Family History  Problem Relation Age of  Onset   Hypertension Mother    Breast cancer Other        MGM's sister   Breast cancer Maternal Grandmother        dx in her 66s    Social History Social History   Tobacco Use   Smoking status: Never   Smokeless tobacco: Never  Vaping Use   Vaping Use: Never used  Substance Use Topics   Alcohol use: Not Currently    Comment: rarely   Drug use: No     Allergies   Patient has no known allergies.   Review of Systems Review of Systems  HENT:  Positive for ear pain.   All other systems reviewed and are negative.   Physical Exam Triage Vital Signs ED Triage Vitals  Enc Vitals Group     BP 01/10/21 1527 133/74     Pulse Rate 01/10/21 1527 73     Resp 01/10/21 1527 18     Temp 01/10/21 1527 98 F (36.7 C)     Temp Source 01/10/21 1527 Oral     SpO2 01/10/21 1527 97 %     Weight --      Height --      Head Circumference --      Peak Flow --      Pain Score 01/10/21 1528 5     Pain Loc --      Pain Edu? --      Excl. in [place]? --    No data found.  Updated Vital Signs BP 133/74 (BP Location: Left Arm)   Pulse 73   Temp 98 F (36.7 C) (Oral)   Resp 18   SpO2 97%   Visual Acuity Right Eye Distance:   Left Eye Distance:   Bilateral Distance:    Right Eye Near:   Left Eye Near:    Bilateral Near:     Physical Exam Vitals reviewed.  HENT:     Left Ear: Tympanic membrane normal.     Ears:     Comments: Ight tm, scarred, erythematous,   Cardiovascular:     Rate and Rhythm: Normal rate.     Pulses: Normal pulses.  Pulmonary:     Effort: Pulmonary effort is normal.  Abdominal:     General: Abdomen is flat.  Musculoskeletal:        General: Normal range of motion.  Skin:    General: Skin is warm.     Comments: Mole upper back   Neurological:     General: No focal deficit present.     Mental Status: She is alert.  Psychiatric:        Mood and Affect: Mood normal.     UC Treatments / Results  Labs (all labs ordered are listed, but only  abnormal results are displayed) Labs Reviewed - No data to  display  EKG   Radiology No results found.  Procedures Procedures (including critical care time)  Medications Ordered in UC Medications - No data to display  Initial Impression / Assessment and Plan / UC Course  I have reviewed the triage vital signs and the nursing notes.  Pertinent labs & imaging results that were available during my care of the patient were reviewed by me and considered in my medical decision making (see chart for details).     MDM: Pt given rx for cefdiner Pt advised to follow up with ENT.  Pt advised to see Dermatology for evalaution  Final Clinical Impressions(s) / UC Diagnoses   Final diagnoses:  Acute otitis media, unspecified otitis media type     Discharge Instructions      Return if any problems.     ED Prescriptions     Medication Sig Dispense Auth. Provider   cefdinir (OMNICEF) 300 MG capsule Take 1 capsule (300 mg total) by mouth 2 (two) times daily. 20 capsule Fransico Meadow, [place]      An After Visit Summary was printed and given to the patient.  PDMP not reviewed this encounter.   Fransico Meadow, [place] 01/10/21 1556

## 2021-01-10 NOTE — Discharge Instructions (Addendum)
Return if any problems.

## 2021-01-10 NOTE — ED Triage Notes (Signed)
Pt here for right ear pain that has continued after taking antibiotics for same

## 2021-04-11 ENCOUNTER — Encounter: Payer: Self-pay | Admitting: Emergency Medicine

## 2021-04-11 ENCOUNTER — Other Ambulatory Visit: Payer: Self-pay

## 2021-04-11 ENCOUNTER — Ambulatory Visit
Admission: EM | Admit: 2021-04-11 | Discharge: 2021-04-11 | Disposition: A | Discharge status: Home / Self Care | Payer: Managed Care, Other (non HMO) | Attending: Internal Medicine | Admitting: Internal Medicine

## 2021-04-11 DIAGNOSIS — R519 Headache, unspecified: Secondary | ICD-10-CM

## 2021-04-11 DIAGNOSIS — J069 Acute upper respiratory infection, unspecified: Secondary | ICD-10-CM

## 2021-04-11 DIAGNOSIS — J029 Acute pharyngitis, unspecified: Secondary | ICD-10-CM

## 2021-04-11 DIAGNOSIS — R110 Nausea: Secondary | ICD-10-CM

## 2021-04-11 DIAGNOSIS — H65194 Other acute nonsuppurative otitis media, recurrent, right ear: Secondary | ICD-10-CM

## 2021-04-11 LAB — POCT RAPID STREP A (OFFICE): Rapid Strep A Screen: NEGATIVE

## 2021-04-11 MED ORDER — ONDANSETRON 4 MG PO TBDP: 4.0000 mg | ORAL_TABLET | Freq: Three times a day (TID) | ORAL | 0 refills | Status: DC | PRN

## 2021-04-11 MED ORDER — CEFDINIR 300 MG PO CAPS: 300.0000 mg | ORAL_CAPSULE | Freq: Two times a day (BID) | ORAL | 0 refills | Status: DC

## 2021-04-11 MED ORDER — KETOROLAC TROMETHAMINE 30 MG/ML IJ SOLN
30.0000 mg | Freq: Once | INTRAMUSCULAR | Status: AC
  Administered 2021-04-11: 30 mg via INTRAMUSCULAR

## 2021-04-11 NOTE — ED Triage Notes (Signed)
Sore throat, nausea, headache x 3 days. Tylenol cold/flu with excedrin migraine, has tried aleve, nyquil/dayquil, no improvement. Denies being around any sick people that she's aware of.

## 2021-04-11 NOTE — Discharge Instructions (Addendum)
You are being treated with cefdinir antibiotic for right ear infection.  Nausea medication has also been sent for you to take as needed.  You were given Toradol injection today in urgent care to help with headache.  Please avoid taking any over-the-counter ibuprofen, Advil, Aleve for at least 24 hours following injection.  Go to hospital if symptoms do not improve or if they worsen.  Your COVID-19 test is pending.  We will call if it is positive.  Throat culture is also pending.

## 2021-04-11 NOTE — ED Provider Notes (Signed)
[facility]    CSN: [id number] Arrival date & time: 04/11/21  5916      History   Chief Complaint Chief Complaint  Patient presents with   Sore Throat   Nausea   Headache    HPI [name] is a 45 y.o. female.   Patient presents with sore throat, nausea, right ear pain, headache, mild cough that is nonproductive that has been present for 3 days.  Has taken cold and flu medication, Excedrin Migraine, Aleve, NyQuil, DayQuil with no improvement in symptoms.  Headache is present throughout entire head.  Having some associated nausea.  Denies any dizziness or blurred vision.  Denies any head injuries.  Denies any fevers or known sick contacts.  Denies chest pain or shortness of breath.   Sore Throat  Headache  Past Medical History:  Diagnosis Date   Asthma    no current med.   Breast cancer of upper-outer quadrant of left female breast ([place]) 07/16/2014   Heart murmur    as a child   History of seizures    during pregnancies; last seizure 3 yrs. ago   Personal history of radiation therapy 2016   Seizures ([place])    no medications   Stress headaches     Patient Active Problem List   Diagnosis Date Noted   History of breast cancer 10/23/2019   Anxiety 07/05/2015   Breast cancer of upper-outer quadrant of left female breast ([place]) 07/16/2014   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 12/20/2009   SEIZURE DISORDER 07/07/2009   DEPRESSION, RECURRENT 08/24/2008    Past Surgical History:  Procedure Laterality Date   ABDOMINAL HYSTERECTOMY     BREAST BIOPSY Left 07/14/2014   x2   BREAST BIOPSY Left 08/21/2016   BREAST LUMPECTOMY Left 08/06/2014   BREAST SURGERY     lumpectomy and lymph nodes   CESAREAN SECTION  3846,6599   CESAREAN SECTION WITH BILATERAL TUBAL LIGATION  03/03/2003   CYSTO  12/20/2009   HYSTEROSCOPY W/ ENDOMETRIAL ABLATION  11/11/2007   LYSIS OF ADHESION  12/20/2009   TOTAL ABDOMINAL HYSTERECTOMY W/ BILATERAL  SALPINGOOPHORECTOMY  12/20/2009   TYMPANOSTOMY TUBE PLACEMENT      OB History   No obstetric history on file.      Home Medications    Prior to Admission medications   Medication Sig Start Date End Date Taking? Authorizing Provider  cefdinir (OMNICEF) 300 MG capsule Take 1 capsule (300 mg total) by mouth 2 (two) times daily for 10 days. 04/11/21 04/21/21 Yes Odis Luster, FNP  ondansetron (ZOFRAN-ODT) 4 MG disintegrating tablet Take 1 tablet (4 mg total) by mouth every 8 (eight) hours as needed. 04/11/21  Yes Odis Luster, FNP  albuterol (VENTOLIN HFA) 108 (90 Base) MCG/ACT inhaler Inhale 1-2 puffs into the lungs every 6 (six) hours as needed for wheezing or shortness of breath. 06/06/20   Wieters, Hallie C, PA-C  amoxicillin (AMOXIL) 875 MG tablet Take 1 tablet (875 mg total) by mouth 2 (two) times daily. Patient not taking: Reported on 01/10/2021 12/15/20   Volney American, PA-C  famotidine (PEPCID) 10 MG tablet Take 10 mg by mouth daily.    [provider]  guaiFENesin-codeine 100-10 MG/5ML syrup Take 5-10 mLs by mouth 3 (three) times daily as needed for cough. 06/06/20   Wieters, Hallie C, PA-C  naproxen sodium (ALEVE) 220 MG tablet Take 220 mg by mouth as needed (pain).    [provider]  Phenylephrine-DM-GG-APAP (TYLENOL  COLD/FLU SEVERE PO) Take by mouth.    [provider]  promethazine-dextromethorphan (PROMETHAZINE-DM) 6.25-15 MG/5ML syrup Take 5 mLs by mouth 4 (four) times daily as needed for cough. 12/15/20   Volney American, PA-C  Pseudoeph-Doxylamine-DM-APAP (NYQUIL PO) Take by mouth.    [provider]  Pseudoephedrine-APAP-DM (DAYQUIL PO) Take by mouth.    [provider]  ranitidine (ZANTAC) 150 MG capsule Take 150 mg by mouth daily as needed for heartburn.    [provider]  valACYclovir (VALTREX) 1000 MG tablet Take 2 tablets (2,000 mg total) by mouth 2 (two) times daily. 10/05/20   Brunetta Jeans,  PA-C  fluticasone (FLONASE) 50 MCG/ACT nasal spray Place 1-2 sprays into both nostrils daily. 05/26/20 06/06/20  Wieters, Elesa Hacker, PA-C    Family History Family History  Problem Relation Age of Onset   Hypertension Mother    Breast cancer Other        MGM's sister   Breast cancer Maternal Grandmother        dx in her 36s    Social History Social History   Tobacco Use   Smoking status: Never   Smokeless tobacco: Never  Vaping Use   Vaping Use: Never used  Substance Use Topics   Alcohol use: Not Currently    Comment: rarely   Drug use: No     Allergies   Patient has no known allergies.   Review of Systems Review of Systems Per HPI  Physical Exam Triage Vital Signs ED Triage Vitals  Enc Vitals Group     BP 04/11/21 0927 122/62     Pulse Rate 04/11/21 0927 67     Resp 04/11/21 0927 16     Temp 04/11/21 0927 98.2 F (36.8 C)     Temp Source 04/11/21 0927 Oral     SpO2 04/11/21 0927 95 %     Weight --      Height --      Head Circumference --      Peak Flow --      Pain Score 04/11/21 0928 6     Pain Loc --      Pain Edu? --      Excl. in [place]? --    No data found.  Updated Vital Signs BP 122/62 (BP Location: Right Arm)   Pulse 67   Temp 98.2 F (36.8 C) (Oral)   Resp 16   SpO2 95%   Visual Acuity Right Eye Distance:   Left Eye Distance:   Bilateral Distance:    Right Eye Near:   Left Eye Near:    Bilateral Near:     Physical Exam Constitutional:      General: She is not in acute distress.    Appearance: Normal appearance. She is not toxic-appearing or diaphoretic.  HENT:     Head: Normocephalic and atraumatic.     Right Ear: Ear canal normal. No mastoid tenderness. Tympanic membrane is erythematous. Tympanic membrane is not perforated or bulging.     Left Ear: Tympanic membrane and ear canal normal.     Nose: Congestion present.     Mouth/Throat:     Mouth: Mucous membranes are moist.     Pharynx: Posterior oropharyngeal erythema  present. No oropharyngeal exudate.     Tonsils: No tonsillar abscesses.  Eyes:     Extraocular Movements: Extraocular movements intact.     Conjunctiva/sclera: Conjunctivae normal.     Pupils: Pupils are equal, round, and reactive to  light.  Cardiovascular:     Rate and Rhythm: Normal rate and regular rhythm.     Pulses: Normal pulses.     Heart sounds: Normal heart sounds.  Pulmonary:     Effort: Pulmonary effort is normal. No respiratory distress.     Breath sounds: Normal breath sounds. No wheezing.  Abdominal:     General: Abdomen is flat. Bowel sounds are normal.     Palpations: Abdomen is soft.  Musculoskeletal:        General: Normal range of motion.     Cervical back: Normal range of motion.  Skin:    General: Skin is warm and dry.  Neurological:     General: No focal deficit present.     Mental Status: She is alert and oriented to person, place, and time. Mental status is at baseline.  Psychiatric:        Mood and Affect: Mood normal.        Behavior: Behavior normal.     UC Treatments / Results  Labs (all labs ordered are listed, but only abnormal results are displayed) Labs Reviewed  NOVEL CORONAVIRUS, NAA  POCT RAPID STREP A (OFFICE)    EKG   Radiology No results found.  Procedures Procedures (including critical care time)  Medications Ordered in UC Medications  ketorolac (TORADOL) 30 MG/ML injection 30 mg (30 mg Intramuscular Given 04/11/21 1123)    Initial Impression / Assessment and Plan / UC Course  I have reviewed the triage vital signs and the nursing notes.  Pertinent labs & imaging results that were available during my care of the patient were reviewed by me and considered in my medical decision making (see chart for details).     Patient presents with symptoms likely from a viral upper respiratory infection. Differential includes bacterial pneumonia, sinusitis, allergic rhinitis, Covid 19. Do not suspect underlying cardiopulmonary  process. Symptoms seem unlikely related to ACS, CHF or COPD exacerbations, pneumonia, pneumothorax. Patient is nontoxic appearing and not in need of emergent medical intervention.  Rapid strep test was negative.  Throat culture and COVID-19 viral swab are pending.  Recommended symptom control with over the counter medications: Daily oral anti-histamine, Oral decongestant or IN corticosteroid, saline irrigations, cepacol lozenges, Robitussin, Delsym, honey tea.  Will treat right otitis media with cefdinir antibiotic.  It appears that patient had ear infection back in July and cefdinir was the only antibiotic that worked for her.  Ondansetron prescribed to take as needed for nausea.  Toradol shot administered in urgent care today for headache.  Highly suspect COVID-19 given sore throat and headache that is refractory to over-the-counter medication as this seems consistent with COVID-19 or viral illness.  Advised patient to go to the hospital if headache does not improve in the next 24 hours with Toradol injection.  Patient to avoid over-the-counter NSAIDs for at least 24 hours following Toradol shot.  Neuro exam was normal so do not think that patient is in need of immediate medical attention at the hospital at this time.  Return if symptoms fail to improve in 1-2 weeks or you develop shortness of breath, chest pain, severe headache. Patient states understanding and is agreeable.  Discharged with PCP followup.  Final Clinical Impressions(s) / UC Diagnoses   Final diagnoses:  Other recurrent acute nonsuppurative otitis media of right ear  Viral upper respiratory infection  Sore throat  Acute nonintractable headache, unspecified headache type  Nausea without vomiting     Discharge Instructions  You are being treated with cefdinir antibiotic for right ear infection.  Nausea medication has also been sent for you to take as needed.  You were given Toradol injection today in urgent care to help  with headache.  Please avoid taking any over-the-counter ibuprofen, Advil, Aleve for at least 24 hours following injection.  Go to hospital if symptoms do not improve or if they worsen.  Your COVID-19 test is pending.  We will call if it is positive.  Throat culture is also pending.     ED Prescriptions     Medication Sig Dispense Auth. Provider   cefdinir (OMNICEF) 300 MG capsule Take 1 capsule (300 mg total) by mouth 2 (two) times daily for 10 days. 20 capsule Odis Luster, FNP   ondansetron (ZOFRAN-ODT) 4 MG disintegrating tablet Take 1 tablet (4 mg total) by mouth every 8 (eight) hours as needed. 20 tablet Odis Luster, FNP      PDMP not reviewed this encounter.   Odis Luster, FNP 04/11/21 1200

## 2021-04-12 LAB — NOVEL CORONAVIRUS, NAA: SARS-CoV-2, NAA: NOT DETECTED

## 2021-04-12 LAB — SARS-COV-2, NAA 2 DAY TAT

## 2021-04-14 LAB — CULTURE, GROUP A STREP (THRC)

## 2021-04-18 ENCOUNTER — Telehealth: Payer: Self-pay | Admitting: *Deleted

## 2021-04-18 NOTE — Telephone Encounter (Signed)
Received call from pt with complaint of ongoing left arm weakness.  Pt states she has not f.u with MD due to covid over the last several years.  Pt requesting yearly f.u.  Appt scheduled and pt verbalized understanding of date and time.

## 2021-04-18 NOTE — Progress Notes (Signed)
Patient Care Team: Pcp, No as PCP - General Alphonsa Overall, MD as Consulting Physician (General Surgery) Nicholas Lose, MD as Consulting Physician (Hematology and Oncology) Thea Silversmith, MD as Consulting Physician (Radiation Oncology)  DIAGNOSIS:    ICD-10-CM   1. Malignant neoplasm of upper-outer quadrant of left breast in female, estrogen receptor positive ([place])  C50.412    Z17.0       SUMMARY OF ONCOLOGIC HISTORY: Oncology History  Breast cancer of upper-outer quadrant of left female breast ([place])  07/14/2014 Mammogram   Left breast UOQ 2:00 position: 1.4 x 1.1 x 1.2 cm mass with indistinct, spiculated margins. Right breast negative.   07/14/2014 Breast US   1.5 cm hypoechoic, irregular mass with spiculated margins UOQ left breast at 2:00, 15 cm from nipple. Intramammary LN 1.4 cm superior to the mass (thin cortex) with another IM LN 3 cm inferior to the index mass with mild focal cortical thickening    07/14/2014 Initial Biopsy   Left breast needle core biopsy (UOQ, 2:00 position): IDC with DCIS; grade 2, ER 100%, PR 96%, HER-2 negative ratio 1.17, Ki-67 17%;  Intramammary LN needle core biopsy (outer left breast, posterior 3:00 position): negative for malignancy.   07/24/2014 Breast MRI   Left breast: 1.4 x 1.4 x 1.3 cm enhancing mass with irregular borders at 3 o'clock position. Right breast with no abnormal enhancement. No abnormal appearing LNs.    07/29/2014 Procedure   Genetic counseling/testing: Breast/Ovarian cancer panel ([facility]) shows no clinically significant variants in ATM, BARD1, BRCA1, BRCA2, BRIP1, CDH1, CHEK2, EPCAM, FANCC, MLH1, MSH2, MSH6, NBN, PALB2, PMS2, PTEN, RAD51C, RAD51D, STK11, TP53, and XRCC2.       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 Negative margins. ER+ (100%), PR+ (96%), HER-2 repeated and negative by FISH.  Ki-67 17%.    08/06/2014 Clinical Stage   Stage IA: T1c, N0 M0   08/06/2014 Oncotype testing   Recurrence score: 7 (6% ROR).  No chemo ([place]).    09/16/2014 - 11/09/2014 Radiation Therapy   Adjuvant RT completed Pablo Ledger). Left breast/ 50.4 Gy at 1.8 Gy per fraction x 28 fractions.   Left breast boost/ 10 Gy at 2 Gy per fraction x 5 fractions   11/25/2014 - 01/08/2015 Anti-estrogen oral therapy   Anastrozole 1 mg daily stopped due to headaches     CHIEF COMPLIANT: Surveillance of breast cancer and left arm weakness  INTERVAL HISTORY: [name] is a 45 y.o. with above-mentioned history of left breast cancer treated with lumpectomy, radiation, and who could not tolerate anastrozole. She is currently on surveillance. Mammogram on 12/13/2020 showed no evidence of malignancy. She presents to the clinic today for follow-up.  She is complaining of weakness in the left arm and she is dropping a few objects.  It is not accompanied by sensory neuropathy.  There is no increase in swelling of the arm.  ALLERGIES:  has No Known Allergies.  MEDICATIONS:  Current Outpatient Medications  Medication Sig Dispense Refill   albuterol (VENTOLIN HFA) 108 (90 Base) MCG/ACT inhaler Inhale 1-2 puffs into the lungs every 6 (six) hours as needed for wheezing or shortness of breath. 18 g 0   amoxicillin (AMOXIL) 875 MG tablet Take 1 tablet (875 mg total) by mouth 2 (two) times daily. (Patient not taking: Reported on 01/10/2021) 20 tablet 0   cefdinir (OMNICEF) 300 MG capsule Take 1 capsule (  300 mg total) by mouth 2 (two) times daily for 10 days. 20 capsule 0   famotidine (PEPCID) 10 MG tablet Take 10 mg by mouth daily.     guaiFENesin-codeine 100-10 MG/5ML syrup Take 5-10 mLs by mouth 3 (three) times daily as needed for cough. 120 mL 0   naproxen sodium (ALEVE) 220 MG tablet Take 220 mg by mouth as needed (pain).     ondansetron (ZOFRAN-ODT) 4 MG disintegrating tablet Take 1 tablet (4 mg total) by mouth every 8 (eight)  hours as needed. 20 tablet 0   Phenylephrine-DM-GG-APAP (TYLENOL COLD/FLU SEVERE PO) Take by mouth.     promethazine-dextromethorphan (PROMETHAZINE-DM) 6.25-15 MG/5ML syrup Take 5 mLs by mouth 4 (four) times daily as needed for cough. 100 mL 0   Pseudoeph-Doxylamine-DM-APAP (NYQUIL PO) Take by mouth.     Pseudoephedrine-APAP-DM (DAYQUIL PO) Take by mouth.     ranitidine (ZANTAC) 150 MG capsule Take 150 mg by mouth daily as needed for heartburn.     valACYclovir (VALTREX) 1000 MG tablet Take 2 tablets (2,000 mg total) by mouth 2 (two) times daily. 4 tablet 0   No current facility-administered medications for this visit.    PHYSICAL EXAMINATION: ECOG PERFORMANCE STATUS: 1 - Symptomatic but completely ambulatory  Vitals:   04/19/21 1144  BP: 124/80  Pulse: 64  Resp: 18  Temp: 97.9 F (36.6 C)  SpO2: 100%   Filed Weights   04/19/21 1144  Weight: 274 lb (124.3 kg)    BREAST: No palpable masses or nodules in either right or left breasts. No palpable axillary supraclavicular or infraclavicular adenopathy no breast tenderness or nipple discharge. (exam performed in the presence of a chaperone)  LABORATORY DATA:  I have reviewed the data as listed CMP Latest Ref Rng & Units 03/28/2020 07/19/2018 07/17/2018  Glucose 70 - 99 mg/dL 94 100(H) 109(H)  BUN 6 - 20 mg/dL _0 Creatinine 0.44 - 1.00 mg/dL 0.94 1.03(H) 0.90  Sodium 135 - 145 mmol/L 138 137 137  Potassium 3.5 - 5.1 mmol/L 4.0 3.7 3.8  Chloride 98 - 111 mmol/L 103 104 105  CO2 22 - 32 mmol/L _1 Calcium 8.9 - 10.3 mg/dL 9.4 9.0 8.9  Total Protein 6.5 - 8.1 g/dL - 7.0 7.1  Total Bilirubin 0.3 - 1.2 mg/dL - 0.3 0.5  Alkaline Phos 38 - 126 U/L - 72 71  AST 15 - 41 U/L - 28 23  ALT 0 - 44 U/L - 25 23    Lab Results  Component Value Date   WBC 7.9 03/28/2020   HGB 13.3 03/28/2020   HCT 41.0 03/28/2020   MCV 83.8 03/28/2020   PLT 333 03/28/2020   NEUTROABS 4.1 04/11/2018    ASSESSMENT & PLAN:  Breast cancer of  upper-outer quadrant of left female breast ([place]) Left breast lumpectomy: Invasive ductal carcinoma with DCIS, no LVI, 2 sentinel nodes negative, 1.3 cm, grade 1, 100%, PR 96 wasn't, HER-2 negative Ki-67 17% T1 cN0 M0 stage IA Oncotype DX recurrence score is 7, 6% ROR status post radiation completed May 2016, patient had hysterectomy with bilateral salpingo-oophorectomy 2011)  Anastrozole 1 mg daily started June 2016 stopped July 2016 due to severe headaches   Current treatment: Unable to tolerate antiestrogen therapy. Severe anxiety: PCP taking care. Shes contemplating on seeing a physicatrist Severe fatigue which is chronic   Breast cancer surveillance: 1.  Breast exam 04/19/2021: Benign 2. mammogram  11/30/2020: No evidence of malignancy breast density category  A   Left arm weakness: Consulted physical therapy.   Return to clinic in 1 year for follow-up    No orders of the defined types were placed in this encounter.  The patient has a good understanding of the overall plan. she agrees with it. she will call with any problems that may develop before the next visit here.  Total time spent: 20 mins including face to face time and time spent for planning, charting and coordination of care  Rulon Eisenmenger, MD, MPH 04/19/2021  I, Thana Ates, am acting as scribe for Dr. Nicholas Lose.  I have reviewed the above documentation for accuracy and completeness, and I agree with the above.

## 2021-04-19 ENCOUNTER — Inpatient Hospital Stay: Payer: 59 | Attending: Hematology and Oncology | Admitting: Hematology and Oncology

## 2021-04-19 ENCOUNTER — Other Ambulatory Visit: Payer: Self-pay

## 2021-04-19 DIAGNOSIS — R531 Weakness: Secondary | ICD-10-CM | POA: Insufficient documentation

## 2021-04-19 DIAGNOSIS — C50412 Malignant neoplasm of upper-outer quadrant of left female breast: Secondary | ICD-10-CM | POA: Insufficient documentation

## 2021-04-19 DIAGNOSIS — Z170 Estrogen receptor positive status [ER+]: Secondary | ICD-10-CM | POA: Insufficient documentation

## 2021-04-19 DIAGNOSIS — Z79811 Long term (current) use of aromatase inhibitors: Secondary | ICD-10-CM | POA: Insufficient documentation

## 2021-04-19 DIAGNOSIS — Z923 Personal history of irradiation: Secondary | ICD-10-CM | POA: Diagnosis not present

## 2021-04-19 DIAGNOSIS — F419 Anxiety disorder, unspecified: Secondary | ICD-10-CM | POA: Diagnosis not present

## 2021-04-19 DIAGNOSIS — R5383 Other fatigue: Secondary | ICD-10-CM | POA: Insufficient documentation

## 2021-04-19 NOTE — Assessment & Plan Note (Signed)
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 Severe anxiety: PCP taking care. Shes contemplating on seeing a physicatrist Severe fatigue which is chronic  Breast cancer surveillance: 1.Breast exam 04/19/2021: Benign 2.mammogram  11/30/2020: No evidence of malignancy breast density category A  Left arm axilla: Consulted physical therapy.  Return to clinic in 1 year for follow-up

## 2021-07-20 ENCOUNTER — Ambulatory Visit
Admission: EM | Admit: 2021-07-20 | Discharge: 2021-07-20 | Disposition: A | Discharge status: Home / Self Care | Payer: 59 | Attending: Physician Assistant | Admitting: Physician Assistant

## 2021-07-20 ENCOUNTER — Other Ambulatory Visit: Payer: Self-pay

## 2021-07-20 DIAGNOSIS — J209 Acute bronchitis, unspecified: Secondary | ICD-10-CM | POA: Diagnosis not present

## 2021-07-20 MED ORDER — PREDNISONE 20 MG PO TABS: 40.0000 mg | ORAL_TABLET | Freq: Every day | ORAL | 0 refills | Status: AC

## 2021-07-20 MED ORDER — AZITHROMYCIN 250 MG PO TABS: 250.0000 mg | ORAL_TABLET | Freq: Every day | ORAL | 0 refills | Status: DC

## 2021-07-20 NOTE — ED Triage Notes (Signed)
1.5 week of runny nose, productive cough, fatigue and intermittent HA. Pt reports a decreased appetite and feeling nauseous after eating. Has been taking otc meds without relief. Confirms diarrhea. Denies emesis.

## 2021-07-20 NOTE — ED Provider Notes (Signed)
[facility]    CSN: [id number] Arrival date & time: 07/20/21  1150      History   Chief Complaint Chief Complaint  Patient presents with   Cough    HPI [name] is a 46 y.o. female.   Patient here today for evaluation of cough and runny nose she has had for the last week and a half.  She reports that she does have some shortness of breath with walking.  She does have history of asthma.  She has been taking over-the-counter medication without significant relief.  She took 2 COVID tests at home that were negative.  The history is provided by the patient.   Past Medical History:  Diagnosis Date   Asthma    no current med.   Breast cancer of upper-outer quadrant of left female breast ([place]) 07/16/2014   Heart murmur    as a child   History of seizures    during pregnancies; last seizure 3 yrs. ago   Personal history of radiation therapy 2016   Seizures ([place])    no medications   Stress headaches     Patient Active Problem List   Diagnosis Date Noted   History of breast cancer 10/23/2019   Anxiety 07/05/2015   Breast cancer of upper-outer quadrant of left female breast ([place]) 07/16/2014   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 12/20/2009   SEIZURE DISORDER 07/07/2009   DEPRESSION, RECURRENT 08/24/2008    Past Surgical History:  Procedure Laterality Date   ABDOMINAL HYSTERECTOMY     BREAST BIOPSY Left 07/14/2014   x2   BREAST BIOPSY Left 08/21/2016   BREAST LUMPECTOMY Left 08/06/2014   BREAST SURGERY     lumpectomy and lymph nodes   CESAREAN SECTION  2878,6767   CESAREAN SECTION WITH BILATERAL TUBAL LIGATION  03/03/2003   CYSTO  12/20/2009   HYSTEROSCOPY W/ ENDOMETRIAL ABLATION  11/11/2007   LYSIS OF ADHESION  12/20/2009   TOTAL ABDOMINAL HYSTERECTOMY W/ BILATERAL SALPINGOOPHORECTOMY  12/20/2009   TYMPANOSTOMY TUBE PLACEMENT      OB History   No obstetric history on file.      Home Medications    Prior to Admission  medications   Medication Sig Start Date End Date Taking? Authorizing Provider  azithromycin (ZITHROMAX) 250 MG tablet Take 1 tablet (250 mg total) by mouth daily. Take first 2 tablets together, then 1 every day until finished. 07/20/21  Yes Francene Finders, PA-C  predniSONE (DELTASONE) 20 MG tablet Take 2 tablets (40 mg total) by mouth daily with breakfast for 5 days. 07/20/21 07/25/21 Yes Francene Finders, PA-C  albuterol (VENTOLIN HFA) 108 (90 Base) MCG/ACT inhaler Inhale 1-2 puffs into the lungs every 6 (six) hours as needed for wheezing or shortness of breath. 06/06/20   Wieters, Hallie C, PA-C  famotidine (PEPCID) 10 MG tablet Take 10 mg by mouth daily.    [provider]  naproxen sodium (ALEVE) 220 MG tablet Take 220 mg by mouth as needed (pain).    [provider]  fluticasone (FLONASE) 50 MCG/ACT nasal spray Place 1-2 sprays into both nostrils daily. 05/26/20 06/06/20  Wieters, Elesa Hacker, PA-C    Family History Family History  Problem Relation Age of Onset   Hypertension Mother    Breast cancer Other        MGM's sister   Breast cancer Maternal Grandmother        dx in her 23s    Social History Social History  Tobacco Use   Smoking status: Never   Smokeless tobacco: Never  Vaping Use   Vaping Use: Never used  Substance Use Topics   Alcohol use: Not Currently    Comment: rarely   Drug use: No     Allergies   Patient has no known allergies.   Review of Systems Review of Systems  Constitutional:  Negative for chills and fever.  HENT:  Positive for congestion, ear pain and sore throat.   Eyes:  Negative for discharge and redness.  Respiratory:  Positive for cough and shortness of breath. Negative for wheezing.   Gastrointestinal:  Positive for diarrhea and nausea. Negative for abdominal pain and vomiting.    Physical Exam Triage Vital Signs ED Triage Vitals  Enc Vitals Group     BP      Pulse      Resp      Temp      Temp src      SpO2       Weight      Height      Head Circumference      Peak Flow      Pain Score      Pain Loc      Pain Edu?      Excl. in [place]?    No data found.  Updated Vital Signs BP 120/68 (BP Location: Left Arm)    Pulse 65    Temp 98.1 F (36.7 C) (Oral)    Resp 18    SpO2 95%      Physical Exam Vitals and nursing note reviewed.  Constitutional:      General: She is not in acute distress.    Appearance: Normal appearance. She is not ill-appearing.  HENT:     Head: Normocephalic and atraumatic.     Right Ear: Tympanic membrane normal.     Left Ear: Tympanic membrane normal.     Nose: Congestion present.     Mouth/Throat:     Mouth: Mucous membranes are moist.     Pharynx: No oropharyngeal exudate or posterior oropharyngeal erythema.  Eyes:     Conjunctiva/sclera: Conjunctivae normal.  Cardiovascular:     Rate and Rhythm: Normal rate and regular rhythm.     Heart sounds: Normal heart sounds. No murmur heard. Pulmonary:     Effort: Pulmonary effort is normal. No respiratory distress.     Breath sounds: Normal breath sounds. No wheezing, rhonchi or rales.  Skin:    General: Skin is warm and dry.  Neurological:     Mental Status: She is alert.  Psychiatric:        Mood and Affect: Mood normal.        Thought Content: Thought content normal.     UC Treatments / Results  Labs (all labs ordered are listed, but only abnormal results are displayed) Labs Reviewed - No data to display  EKG   Radiology No results found.  Procedures Procedures (including critical care time)  Medications Ordered in UC Medications - No data to display  Initial Impression / Assessment and Plan / UC Course  I have reviewed the triage vital signs and the nursing notes.  Pertinent labs & imaging results that were available during my care of the patient were reviewed by me and considered in my medical decision making (see chart for details).    Will treat to cover bronchitis with prednisone, and will  treat with Z-Pak to cover atypical pneumonia.  Recommended follow-up  if symptoms fail to improve with treatment or worsen in any way.  Final Clinical Impressions(s) / UC Diagnoses   Final diagnoses:  Acute bronchitis, unspecified organism   Discharge Instructions   None    ED Prescriptions     Medication Sig Dispense Auth. Provider   predniSONE (DELTASONE) 20 MG tablet Take 2 tablets (40 mg total) by mouth daily with breakfast for 5 days. 10 tablet Ewell Poe F, PA-C   azithromycin (ZITHROMAX) 250 MG tablet Take 1 tablet (250 mg total) by mouth daily. Take first 2 tablets together, then 1 every day until finished. 6 tablet Francene Finders, PA-C      PDMP not reviewed this encounter.   Francene Finders, PA-C 07/20/21 1330

## 2021-08-11 ENCOUNTER — Ambulatory Visit
Admission: EM | Admit: 2021-08-11 | Discharge: 2021-08-11 | Disposition: A | Discharge status: Home / Self Care | Payer: 59 | Attending: Internal Medicine | Admitting: Internal Medicine

## 2021-08-11 ENCOUNTER — Encounter: Payer: Self-pay | Admitting: Emergency Medicine

## 2021-08-11 ENCOUNTER — Ambulatory Visit (INDEPENDENT_AMBULATORY_CARE_PROVIDER_SITE_OTHER): Payer: 59

## 2021-08-11 ENCOUNTER — Other Ambulatory Visit: Payer: Self-pay

## 2021-08-11 DIAGNOSIS — R0602 Shortness of breath: Secondary | ICD-10-CM | POA: Diagnosis not present

## 2021-08-11 DIAGNOSIS — J069 Acute upper respiratory infection, unspecified: Secondary | ICD-10-CM | POA: Diagnosis not present

## 2021-08-11 DIAGNOSIS — R059 Cough, unspecified: Secondary | ICD-10-CM

## 2021-08-11 MED ORDER — ALBUTEROL SULFATE HFA 108 (90 BASE) MCG/ACT IN AERS: 1.0000 | INHALATION_SPRAY | Freq: Four times a day (QID) | RESPIRATORY_TRACT | 0 refills | Status: DC | PRN

## 2021-08-11 MED ORDER — PROMETHAZINE-DM 6.25-15 MG/5ML PO SYRP: 5.0000 mL | ORAL_SOLUTION | Freq: Four times a day (QID) | ORAL | 0 refills | Status: DC | PRN

## 2021-08-11 NOTE — ED Provider Notes (Signed)
[facility]    CSN: [id number] Arrival date & time: 08/11/21  0807      History   Chief Complaint Chief Complaint  Patient presents with   Cough    HPI [name] is a 46 y.o. female.   Patient presents with 3-day history of productive cough and shortness of breath.  She does report associated runny nose at times.  Cough is productive with yellow sputum per patient.  Denies any known fevers or sick contacts.  Patient was seen on 07/20/2021 and treated for acute bronchitis with azithromycin and prednisone.  She reports improvement with those symptoms, and these are new symptoms.  Does have a history of asthma in childhood but denies complications in adulthood.  Patient is not a smoker.  Denies chest pain, sore throat, ear pain, nausea, vomiting, diarrhea, abdominal pain.  Patient does not report taking any medications to alleviate symptoms.   Cough  Past Medical History:  Diagnosis Date   Asthma    no current med.   Breast cancer of upper-outer quadrant of left female breast ([place]) 07/16/2014   Heart murmur    as a child   History of seizures    during pregnancies; last seizure 3 yrs. ago   Personal history of radiation therapy 2016   Seizures ([place])    no medications   Stress headaches     Patient Active Problem List   Diagnosis Date Noted   History of breast cancer 10/23/2019   Anxiety 07/05/2015   Breast cancer of upper-outer quadrant of left female breast ([place]) 07/16/2014   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 12/20/2009   SEIZURE DISORDER 07/07/2009   DEPRESSION, RECURRENT 08/24/2008    Past Surgical History:  Procedure Laterality Date   ABDOMINAL HYSTERECTOMY     BREAST BIOPSY Left 07/14/2014   x2   BREAST BIOPSY Left 08/21/2016   BREAST LUMPECTOMY Left 08/06/2014   BREAST SURGERY     lumpectomy and lymph nodes   CESAREAN SECTION  5456,2563   CESAREAN SECTION WITH BILATERAL TUBAL LIGATION  03/03/2003   CYSTO   12/20/2009   HYSTEROSCOPY W/ ENDOMETRIAL ABLATION  11/11/2007   LYSIS OF ADHESION  12/20/2009   TOTAL ABDOMINAL HYSTERECTOMY W/ BILATERAL SALPINGOOPHORECTOMY  12/20/2009   TYMPANOSTOMY TUBE PLACEMENT      OB History   No obstetric history on file.      Home Medications    Prior to Admission medications   Medication Sig Start Date End Date Taking? Authorizing Provider  albuterol (VENTOLIN HFA) 108 (90 Base) MCG/ACT inhaler Inhale 1-2 puffs into the lungs every 6 (six) hours as needed for wheezing or shortness of breath. 08/11/21  Yes Mohmed Farver, Michele Rockers, FNP  promethazine-dextromethorphan (PROMETHAZINE-DM) 6.25-15 MG/5ML syrup Take 5 mLs by mouth 4 (four) times daily as needed for cough. 08/11/21  Yes Javares Kaufhold, Hildred Alamin E, FNP  azithromycin (ZITHROMAX) 250 MG tablet Take 1 tablet (250 mg total) by mouth daily. Take first 2 tablets together, then 1 every day until finished. 07/20/21   Francene Finders, PA-C  famotidine (PEPCID) 10 MG tablet Take 10 mg by mouth daily.    [provider]  naproxen sodium (ALEVE) 220 MG tablet Take 220 mg by mouth as needed (pain).    [provider]  fluticasone (FLONASE) 50 MCG/ACT nasal spray Place 1-2 sprays into both nostrils daily. 05/26/20 06/06/20  Wieters, Elesa Hacker, PA-C    Family History Family History  Problem Relation Age of Onset  Hypertension Mother    Breast cancer Other        MGM's sister   Breast cancer Maternal Grandmother        dx in her 15s    Social History Social History   Tobacco Use   Smoking status: Never   Smokeless tobacco: Never  Vaping Use   Vaping Use: Never used  Substance Use Topics   Alcohol use: Not Currently    Comment: rarely   Drug use: No     Allergies   Patient has no known allergies.   Review of Systems Review of Systems Per HPI  Physical Exam Triage Vital Signs ED Triage Vitals [08/11/21 0821]  Enc Vitals Group     BP 127/85     Pulse Rate 70     Resp 16     Temp 98 F (36.7 C)      Temp Source Oral     SpO2 97 %     Weight      Height      Head Circumference      Peak Flow      Pain Score 3     Pain Loc      Pain Edu?      Excl. in [place]?    No data found.  Updated Vital Signs BP 127/85 (BP Location: Left Arm)    Pulse 70    Temp 98 F (36.7 C) (Oral)    Resp 16    SpO2 97%   Visual Acuity Right Eye Distance:   Left Eye Distance:   Bilateral Distance:    Right Eye Near:   Left Eye Near:    Bilateral Near:     Physical Exam Constitutional:      General: She is not in acute distress.    Appearance: Normal appearance. She is not toxic-appearing or diaphoretic.  HENT:     Head: Normocephalic and atraumatic.     Right Ear: Tympanic membrane and ear canal normal.     Left Ear: Tympanic membrane and ear canal normal.     Nose: Congestion present.     Mouth/Throat:     Mouth: Mucous membranes are moist.     Pharynx: No posterior oropharyngeal erythema.  Eyes:     Extraocular Movements: Extraocular movements intact.     Conjunctiva/sclera: Conjunctivae normal.     Pupils: Pupils are equal, round, and reactive to light.  Cardiovascular:     Rate and Rhythm: Normal rate and regular rhythm.     Pulses: Normal pulses.     Heart sounds: Normal heart sounds.  Pulmonary:     Effort: Pulmonary effort is normal. No respiratory distress.     Breath sounds: Normal breath sounds. No wheezing.     Comments: Harsh cough on exam. Abdominal:     General: Abdomen is flat. Bowel sounds are normal.     Palpations: Abdomen is soft.  Musculoskeletal:        General: Normal range of motion.     Cervical back: Normal range of motion.  Skin:    General: Skin is warm and dry.  Neurological:     General: No focal deficit present.     Mental Status: She is alert and oriented to person, place, and time. Mental status is at baseline.  Psychiatric:        Mood and Affect: Mood normal.        Behavior: Behavior normal.     UC Treatments / Results  Labs (all labs  ordered are listed, but only abnormal results are displayed) Labs Reviewed  COVID-19, FLU A+B NAA    EKG   Radiology DG Chest 2 View  Result Date: 08/11/2021 CLINICAL DATA:  Cough and shortness of breath. EXAM: CHEST - 2 VIEW COMPARISON:  Chest x-ray dated June 06, 2020. FINDINGS: The heart size and mediastinal contours are within normal limits. Both lungs are clear. The visualized skeletal structures are unremarkable. IMPRESSION: No active cardiopulmonary disease. Electronically Signed   By: Titus Dubin M.D.   On: 08/11/2021 08:46    Procedures Procedures (including critical care time)  Medications Ordered in UC Medications - No data to display  Initial Impression / Assessment and Plan / UC Course  I have reviewed the triage vital signs and the nursing notes.  Pertinent labs & imaging results that were available during my care of the patient were reviewed by me and considered in my medical decision making (see chart for details).     Patient presents with symptoms likely from a viral upper respiratory infection. Differential includes bacterial pneumonia, sinusitis, allergic rhinitis, COVID-19, flu. Do not suspect underlying cardiopulmonary process. Symptoms seem unlikely related to ACS, CHF or COPD exacerbations, pneumonia, pneumothorax. Patient is nontoxic appearing and not in need of emergent medical intervention.  COVID and flu test pending.  Chest x-ray was negative for any acute cardiopulmonary process.  Recommended symptom control with over the counter medications.  Patient sent prescriptions and albuterol inhaler for shortness of breath.  Advised patient that cough medication can cause drowsiness.  Return if symptoms fail to improve in 1-2 weeks. Patient states understanding and is agreeable.  Discharged with PCP followup.  Final Clinical Impressions(s) / UC Diagnoses   Final diagnoses:  Viral upper respiratory tract infection with cough     Discharge  Instructions      It appears that you have a viral upper respiratory infection that should self resolve in the next few days.  You have been prescribed albuterol inhaler to take as needed for shortness of breath as well as a cough medication to take as needed.  Please be advised that cough medication can cause drowsiness.  Follow-up if symptoms persist or worsen.  COVID test is pending.     ED Prescriptions     Medication Sig Dispense Auth. Provider   albuterol (VENTOLIN HFA) 108 (90 Base) MCG/ACT inhaler Inhale 1-2 puffs into the lungs every 6 (six) hours as needed for wheezing or shortness of breath. 1 each [place], Hildred Alamin E, [place]   promethazine-dextromethorphan (PROMETHAZINE-DM) 6.25-15 MG/5ML syrup Take 5 mLs by mouth 4 (four) times daily as needed for cough. 118 mL Teodora Medici, [place]      PDMP not reviewed this encounter.   Teodora Medici, [place] 08/11/21 [phone]

## 2021-08-11 NOTE — Discharge Instructions (Signed)
It appears that you have a viral upper respiratory infection that should self resolve in the next few days.  You have been prescribed albuterol inhaler to take as needed for shortness of breath as well as a cough medication to take as needed.  Please be advised that cough medication can cause drowsiness.  Follow-up if symptoms persist or worsen.  COVID test is pending.

## 2021-08-11 NOTE — ED Triage Notes (Signed)
Patient reports having a bad cough x 3 days. Reports she felt sick 2 weeks ago, improved, then got sick again x 3 days. Reports rib pain from coughing, shortness of breath, and states she can't lay down due to coughing/SOB. Productive, yellow/white mucus

## 2021-08-12 LAB — COVID-19, FLU A+B NAA
Influenza A, NAA: NOT DETECTED
Influenza B, NAA: NOT DETECTED
SARS-CoV-2, NAA: NOT DETECTED

## 2021-09-06 ENCOUNTER — Other Ambulatory Visit: Payer: Self-pay

## 2021-09-06 ENCOUNTER — Ambulatory Visit
Admission: EM | Admit: 2021-09-06 | Discharge: 2021-09-06 | Disposition: A | Discharge status: Home / Self Care | Payer: 59 | Attending: Physician Assistant | Admitting: Physician Assistant

## 2021-09-06 DIAGNOSIS — J069 Acute upper respiratory infection, unspecified: Secondary | ICD-10-CM | POA: Insufficient documentation

## 2021-09-06 LAB — POCT RAPID STREP A (OFFICE): Rapid Strep A Screen: NEGATIVE

## 2021-09-06 NOTE — ED Triage Notes (Signed)
Onset yesterday of sore throat, cough, HA, chills and congestion. Confirms pain w/swallowing. ?Exposed to strep by grandkids. No v/d. Has been taking tylenol w/some relief. ?

## 2021-09-06 NOTE — ED Provider Notes (Signed)
?[facility] ? ? ? ?CSN: [id number] ?Arrival date & time: 09/06/21  1619 ? ? ?  ? ?History   ?Chief Complaint ?Chief Complaint  ?Patient presents with  ? Sore Throat  ? ? ?HPI ?[name] is a 46 y.o. female.  ? ?Patient here today for evaluation of sore throat, cough, headache, fever chills and congestion that started yesterday.  She reports she does have pain with swallowing.  She notes she has had exposure to strep through her grandchildren.  She has not had any nausea, vomiting or diarrhea.  She has tried taking Tylenol with mild relief.  She reports that Tmax at home was 102 this morning. ? ?The history is provided by the patient.  ? ?Past Medical History:  ?Diagnosis Date  ? Asthma   ? no current med.  ? Breast cancer of upper-outer quadrant of left female breast ([place]) 07/16/2014  ? Heart murmur   ? as a child  ? History of seizures   ? during pregnancies; last seizure 3 yrs. ago  ? Personal history of radiation therapy 2016  ? Seizures ([place])   ? no medications  ? Stress headaches   ? ? ?Patient Active Problem List  ? Diagnosis Date Noted  ? History of breast cancer 10/23/2019  ? Anxiety 07/05/2015  ? Breast cancer of upper-outer quadrant of left female breast ([name]) 07/16/2014  ? 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 12/20/2009  ? SEIZURE DISORDER 07/07/2009  ? DEPRESSION, RECURRENT 08/24/2008  ? ? ?Past Surgical History:  ?Procedure Laterality Date  ? ABDOMINAL HYSTERECTOMY    ? BREAST BIOPSY Left 07/14/2014  ? x2  ? BREAST BIOPSY Left 08/21/2016  ? BREAST LUMPECTOMY Left 08/06/2014  ? BREAST SURGERY    ? lumpectomy and lymph nodes  ? CESAREAN SECTION  [id number]  ? CESAREAN SECTION WITH BILATERAL TUBAL LIGATION  03/03/2003  ? CYSTO  12/20/2009  ? HYSTEROSCOPY W/ ENDOMETRIAL ABLATION  11/11/2007  ? LYSIS OF ADHESION  12/20/2009  ? TOTAL ABDOMINAL HYSTERECTOMY W/ BILATERAL SALPINGOOPHORECTOMY  12/20/2009  ? TYMPANOSTOMY TUBE PLACEMENT    ? ? ?OB History   ?No obstetric history  on file. ?  ? ? ? ?Home Medications   ? ?Prior to Admission medications   ?Medication Sig Start Date End Date Taking? Authorizing Provider  ?albuterol (VENTOLIN HFA) 108 (90 Base) MCG/ACT inhaler Inhale 1-2 puffs into the lungs every 6 (six) hours as needed for wheezing or shortness of breath. 08/11/21   Teodora Medici, FNP  ?azithromycin (ZITHROMAX) 250 MG tablet Take 1 tablet (250 mg total) by mouth daily. Take first 2 tablets together, then 1 every day until finished. 07/20/21   Francene Finders, PA-C  ?famotidine (PEPCID) 10 MG tablet Take 10 mg by mouth daily.    [provider]  ?naproxen sodium (ALEVE) 220 MG tablet Take 220 mg by mouth as needed (pain).    [provider]  ?promethazine-dextromethorphan (PROMETHAZINE-DM) 6.25-15 MG/5ML syrup Take 5 mLs by mouth 4 (four) times daily as needed for cough. 08/11/21   Teodora Medici, FNP  ?fluticasone (FLONASE) 50 MCG/ACT nasal spray Place 1-2 sprays into both nostrils daily. 05/26/20 06/06/20  Wieters, Hallie C, PA-C  ? ? ?Family History ?Family History  ?Problem Relation Age of Onset  ? Hypertension Mother   ? Breast cancer Other   ?     MGM's sister  ? Breast cancer Maternal Grandmother   ?     dx in her 19s  ? ? ?  Social History ?Social History  ? ?Tobacco Use  ? Smoking status: Never  ? Smokeless tobacco: Never  ?Vaping Use  ? Vaping Use: Never used  ?Substance Use Topics  ? Alcohol use: Not Currently  ?  Comment: rarely  ? Drug use: No  ? ? ? ?Allergies   ?Patient has no known allergies. ? ? ?Review of Systems ?Review of Systems  ?Constitutional:  Positive for chills and fever.  ?HENT:  Positive for congestion and sore throat. Negative for ear pain.   ?Eyes:  Negative for discharge and redness.  ?Respiratory:  Positive for cough. Negative for shortness of breath and wheezing.   ?Gastrointestinal:  Negative for abdominal pain, diarrhea, nausea and vomiting.  ?Neurological:  Positive for headaches.  ? ? ?Physical Exam ?Triage Vital Signs ?ED  Triage Vitals  ?Enc Vitals Group  ?   BP   ?   Pulse   ?   Resp   ?   Temp   ?   Temp src   ?   SpO2   ?   Weight   ?   Height   ?   Head Circumference   ?   Peak Flow   ?   Pain Score   ?   Pain Loc   ?   Pain Edu?   ?   Excl. in [place]?   ? ?No data found. ? ?Updated Vital Signs ?BP 122/81 (BP Location: Left Arm)   Pulse 67   Temp 98.2 ?F (36.8 ?C) (Oral)   Resp 17   SpO2 97%  ?   ? ?Physical Exam ?Vitals and nursing note reviewed.  ?Constitutional:   ?   General: She is not in acute distress. ?   Appearance: Normal appearance. She is not ill-appearing.  ?HENT:  ?   Head: Normocephalic and atraumatic.  ?   Nose: Congestion present.  ?   Mouth/Throat:  ?   Mouth: Mucous membranes are moist.  ?   Pharynx: Posterior oropharyngeal erythema present. No oropharyngeal exudate.  ?Eyes:  ?   Conjunctiva/sclera: Conjunctivae normal.  ?Cardiovascular:  ?   Rate and Rhythm: Normal rate and regular rhythm.  ?   Heart sounds: Normal heart sounds. No murmur heard. ?Pulmonary:  ?   Effort: Pulmonary effort is normal. No respiratory distress.  ?   Breath sounds: Normal breath sounds. No wheezing, rhonchi or rales.  ?Skin: ?   General: Skin is warm and dry.  ?Neurological:  ?   Mental Status: She is alert.  ?Psychiatric:     ?   Mood and Affect: Mood normal.     ?   Thought Content: Thought content normal.  ? ? ? ?UC Treatments / Results  ?Labs ?(all labs ordered are listed, but only abnormal results are displayed) ?Labs Reviewed  ?CULTURE, GROUP A STREP [facility])  ?COVID-19, FLU A+B NAA  ?POCT RAPID STREP A (OFFICE)  ? ? ?EKG ? ? ?Radiology ?No results found. ? ?Procedures ?Procedures (including critical care time) ? ?Medications Ordered in UC ?Medications - No data to display ? ?Initial Impression / Assessment and Plan / UC Course  ?I have reviewed the triage vital signs and the nursing notes. ? ?Pertinent labs & imaging results that were available during my care of the patient were reviewed by me and considered in my medical  decision making (see chart for details). ? ?  ?Unknown etiology of symptoms but likely viral.  Will screen for COVID and flu.  Strep  test negative in office but will order culture specifically due to exposure.  Encouraged symptomatic treatment in the meantime and follow-up if symptoms fail to improve or worsen. ? ?Final Clinical Impressions(s) / UC Diagnoses  ? ?Final diagnoses:  ?Acute upper respiratory infection  ? ?Discharge Instructions   ?None ?  ? ?ED Prescriptions   ?None ?  ? ?PDMP not reviewed this encounter. ?  ?Francene Finders, PA-C ?09/06/21 1817 ? ?

## 2021-09-07 LAB — COVID-19, FLU A+B NAA
Influenza A, NAA: NOT DETECTED
Influenza B, NAA: NOT DETECTED
SARS-CoV-2, NAA: NOT DETECTED

## 2021-09-09 LAB — CULTURE, GROUP A STREP (THRC)

## 2021-09-13 ENCOUNTER — Telehealth: Payer: Self-pay | Admitting: *Deleted

## 2021-09-13 NOTE — Telephone Encounter (Signed)
Received call from pt with complaint of left sided breast pain x2 days.  Pt states she feels a know in her left axilla and is having swelling from the axilla to the breast.  Pt denies redness, warmth, or fever at this time.  Pt states she lifts heavy objects daily at work and is unsure if she has pulled a muscle.  Per MD pt needing to be seen in [facility] for breast exam and further evaluation.  Appt scheduled.  Pt educated to rest and take OTC ibuprofen to alleviate any discomfort.  Pt verbalized understanding of appt date, time, and instructions.  ?

## 2021-09-15 ENCOUNTER — Inpatient Hospital Stay: Payer: 59 | Attending: Adult Health | Admitting: Adult Health

## 2021-09-15 ENCOUNTER — Other Ambulatory Visit: Payer: Self-pay

## 2021-09-15 ENCOUNTER — Telehealth: Payer: Self-pay

## 2021-09-15 VITALS — BP 127/54 | HR 70 | Temp 97.7°F | Resp 16 | Ht 63.0 in | Wt 273.0 lb

## 2021-09-15 DIAGNOSIS — Z90710 Acquired absence of both cervix and uterus: Secondary | ICD-10-CM | POA: Diagnosis not present

## 2021-09-15 DIAGNOSIS — Z853 Personal history of malignant neoplasm of breast: Secondary | ICD-10-CM | POA: Insufficient documentation

## 2021-09-15 DIAGNOSIS — C50412 Malignant neoplasm of upper-outer quadrant of left female breast: Secondary | ICD-10-CM | POA: Diagnosis not present

## 2021-09-15 DIAGNOSIS — Z803 Family history of malignant neoplasm of breast: Secondary | ICD-10-CM | POA: Diagnosis not present

## 2021-09-15 DIAGNOSIS — F419 Anxiety disorder, unspecified: Secondary | ICD-10-CM | POA: Diagnosis not present

## 2021-09-15 DIAGNOSIS — Z170 Estrogen receptor positive status [ER+]: Secondary | ICD-10-CM

## 2021-09-15 NOTE — Progress Notes (Signed)
[facility] Follow up: ?  ? ?Pcp, No ?No address on file ? ? ?DIAGNOSIS:  Cancer Staging  ?Breast cancer of upper-outer quadrant of left female breast ([place]) ?Staging form: Breast, AJCC 7th Edition ?- Clinical stage from 07/22/2014: Stage IA (T1c, N0, M0) - Unsigned ?Staged by: Pathologist and managing physician ?Laterality: Left ?Estrogen receptor status: Positive ?Progesterone receptor status: Positive ?HER2 status: Negative ?Stage used in treatment planning: Yes ?National guidelines used in treatment planning: Yes ?Type of national guideline used in treatment planning: NCCN ?- Pathologic: Stage IA (T1c, N0, cM0) - Unsigned ?Laterality: Left ? ? ?SUMMARY OF ONCOLOGIC HISTORY: ?Oncology History  ?Breast cancer of upper-outer quadrant of left female breast ([name])  ?07/14/2014 Mammogram  ? Left breast UOQ 2:00 position: 1.4 x 1.1 x 1.2 cm mass with indistinct, spiculated margins. Right breast negative. ?  ?07/14/2014 Breast US  ? 1.5 cm hypoechoic, irregular mass with spiculated margins UOQ left breast at 2:00, 15 cm from nipple. Intramammary LN 1.4 cm superior to the mass (thin cortex) with another IM LN 3 cm inferior to the index mass with mild focal cortical thickening  ?  ?07/14/2014 Initial Biopsy  ? Left breast needle core biopsy (UOQ, 2:00 position): IDC with DCIS; grade 2, ER 100%, PR 96%, HER-2 negative ratio 1.17, Ki-67 17%;  Intramammary LN needle core biopsy (outer left breast, posterior 3:00 position): negative for malignancy. ?  ?07/24/2014 Breast MRI  ? Left breast: 1.4 x 1.4 x 1.3 cm enhancing mass with irregular borders at 3 o'clock position. Right breast with no abnormal enhancement. No abnormal appearing LNs.  ?  ?07/29/2014 Procedure  ? Genetic counseling/testing: Breast/Ovarian cancer panel ([facility]) shows no clinically significant variants in ATM, BARD1, BRCA1, BRCA2, BRIP1, CDH1, CHEK2, EPCAM, FANCC, MLH1, MSH2, MSH6, NBN, PALB2, PMS2, PTEN, RAD51C, RAD51D, STK11, TP53, and XRCC2.      ?  ?08/06/2014 Surgery  ? 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 Negative margins. ER+ (100%), PR+ (96%), HER-2 repeated and negative by FISH.  Ki-67 17%. ?  ?08/06/2014 Clinical Stage  ? Stage IA: T1c, N0 M0 ?  ?08/06/2014 Oncotype testing  ? Recurrence score: 7 (6% ROR).  No chemo ([place]).  ?  ?09/16/2014 - 11/09/2014 Radiation Therapy  ? Adjuvant RT completed Pablo Ledger). Left breast/ 50.4 Gy at 1.8 Gy per fraction x 28 fractions.   Left breast boost/ 10 Gy at 2 Gy per fraction x 5 fractions ?  ?11/25/2014 - 01/08/2015 Anti-estrogen oral therapy  ? Anastrozole 1 mg daily stopped due to headaches ?  ? ? ?CURRENT THERAPY: observation.  ? ?INTERVAL HISTORY: ?[name] 46 y.o. female returns for evaluation of left axillary lump and pain noted 3 days ago.  Last mammogram 11/30/2020 and showed no evidence of malignancy, breast density A. She is concerned about this and wanted someone to evaluate it further.  ? ? ?Patient Active Problem List  ? Diagnosis Date Noted  ? History of breast cancer 10/23/2019  ? Anxiety 07/05/2015  ? Breast cancer of upper-outer quadrant of left female breast ([name]) 07/16/2014  ? 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 12/20/2009  ? SEIZURE DISORDER 07/07/2009  ? DEPRESSION, RECURRENT 08/24/2008  ? ? ?has No Known Allergies. ? ?MEDICAL HISTORY: ?Past Medical History:  ?Diagnosis Date  ? Asthma   ? no current med.  ? Breast cancer of upper-outer quadrant of left female breast ([name]) 07/16/2014  ?  Heart murmur   ? as a child  ? History of seizures   ? during pregnancies; last seizure 3 yrs. ago  ? Personal history of radiation therapy 2016  ? Seizures ([name])   ? no medications  ? Stress headaches   ? ? ?SURGICAL HISTORY: ?Past Surgical History:  ?Procedure Laterality Date  ? ABDOMINAL HYSTERECTOMY    ? BREAST BIOPSY Left 07/14/2014  ? x2  ? BREAST BIOPSY Left 08/21/2016  ?  BREAST LUMPECTOMY Left 08/06/2014  ? BREAST SURGERY    ? lumpectomy and lymph nodes  ? CESAREAN SECTION  [id number]  ? CESAREAN SECTION WITH BILATERAL TUBAL LIGATION  03/03/2003  ? CYSTO  12/20/2009  ? HYSTEROSCOPY W/ ENDOMETRIAL ABLATION  11/11/2007  ? LYSIS OF ADHESION  12/20/2009  ? TOTAL ABDOMINAL HYSTERECTOMY W/ BILATERAL SALPINGOOPHORECTOMY  12/20/2009  ? TYMPANOSTOMY TUBE PLACEMENT    ? ? ?SOCIAL HISTORY: ?Social History  ? ?Socioeconomic History  ? Marital status: Single  ?  Spouse name: Not on file  ? Number of children: 3  ? Years of education: Not on file  ? Highest education level: Not on file  ?Occupational History  ? Not on file  ?Tobacco Use  ? Smoking status: Never  ? Smokeless tobacco: Never  ?Vaping Use  ? Vaping Use: Never used  ?Substance and Sexual Activity  ? Alcohol use: Not Currently  ?  Comment: rarely  ? Drug use: No  ? Sexual activity: Yes  ?  Birth control/protection: None  ?Other Topics Concern  ? Not on file  ?Social History Narrative  ? Not on file  ? ?Social Determinants of Health  ? ?Financial Resource Strain: Not on file  ?Food Insecurity: Not on file  ?Transportation Needs: Not on file  ?Physical Activity: Not on file  ?Stress: Not on file  ?Social Connections: Not on file  ?Intimate Partner Violence: Not on file  ? ? ?FAMILY HISTORY: ?Family History  ?Problem Relation Age of Onset  ? Hypertension Mother   ? Breast cancer Other   ?     MGM's sister  ? Breast cancer Maternal Grandmother   ?     dx in her 40s  ? ? ?Review of Systems  ?Constitutional:  Negative for appetite change, chills, fatigue, fever and unexpected weight change.  ?HENT:   Negative for hearing loss, lump/mass and trouble swallowing.   ?Eyes:  Negative for eye problems and icterus.  ?Respiratory:  Negative for chest tightness, cough and shortness of breath.   ?Cardiovascular:  Negative for chest pain, leg swelling and palpitations.  ?Gastrointestinal:  Negative for abdominal distention, abdominal pain, constipation,  diarrhea, nausea and vomiting.  ?Endocrine: Negative for hot flashes.  ?Genitourinary:  Negative for difficulty urinating.   ?Musculoskeletal:  Negative for arthralgias.  ?Skin:  Negative for itching and rash.  ?Neurological:  Negative for dizziness, extremity weakness, headaches and numbness.  ?Hematological:  Negative for adenopathy. Does not bruise/bleed easily.  ?Psychiatric/Behavioral:  Negative for depression. The patient is not nervous/anxious.    ? ? ?PHYSICAL EXAMINATION ? ?ECOG PERFORMANCE STATUS: 1 - Symptomatic but completely ambulatory ? ?Vitals:  ? 09/15/21 1503  ?BP: (!) 127/54  ?Pulse: 70  ?Resp: 16  ?Temp: 97.7 ?F (36.5 ?C)  ?SpO2: 100%  ? ? ?Physical Exam ?Constitutional:   ?   General: She is not in acute distress. ?   Appearance: Normal appearance. She is not toxic-appearing.  ?HENT:  ?   Head: Normocephalic and atraumatic.  ?Eyes:  ?  General: No scleral icterus. ?Cardiovascular:  ?   Rate and Rhythm: Normal rate and regular rhythm.  ?   Pulses: Normal pulses.  ?   Heart sounds: Normal heart sounds.  ?Pulmonary:  ?   Effort: Pulmonary effort is normal.  ?   Breath sounds: Normal breath sounds.  ?Chest:  ?   Comments: Left breast with thickness noted in axillary tail.   ?Abdominal:  ?   General: Abdomen is flat. Bowel sounds are normal. There is no distension.  ?   Palpations: Abdomen is soft.  ?   Tenderness: There is no abdominal tenderness.  ?Musculoskeletal:     ?   General: No swelling.  ?   Cervical back: Neck supple.  ?Lymphadenopathy:  ?   Cervical: No cervical adenopathy.  ?Skin: ?   General: Skin is warm and dry.  ?   Findings: No rash.  ?Neurological:  ?   General: No focal deficit present.  ?   Mental Status: She is alert.  ?Psychiatric:     ?   Mood and Affect: Mood normal.     ?   Behavior: Behavior normal.  ? ? ?LABORATORY DATA: ? ?CBC ?   ?Component Value Date/Time  ? WBC 7.9 03/28/2020 1744  ? RBC 4.89 03/28/2020 1744  ? HGB 13.3 03/28/2020 1744  ? HGB 13.1 04/11/2018 1223  ?  HGB 12.5 02/02/2015 1508  ? HCT 41.0 03/28/2020 1744  ? HCT 38.5 02/02/2015 1508  ? PLT 333 03/28/2020 1744  ? PLT 276 04/11/2018 1223  ? PLT 298 02/02/2015 1508  ? MCV 83.8 03/28/2020 1744  ? MCV 82.4 02/02/19

## 2021-09-15 NOTE — Telephone Encounter (Signed)
Pt scheduled for diag MM 10/06/21 at 1440 [facility]. She is also placed on their cancellation list in case of any cancellations to bring her in sooner. She is aware and verbalized thanks.  ?

## 2021-09-18 NOTE — Assessment & Plan Note (Addendum)
Left breast lumpectomy: Invasive ductal carcinoma with DCIS, no LVI, 2 sentinel nodes negative, 1.3 cm, grade 1, 100%, PR 96 wasn't, HER-2 negative Ki-67 17% T1 cN0 M0 stage IA Oncotype DX recurrence score is 7, 6% ROR status post radiation completed May 2016, patient had hysterectomy with bilateral salpingo-oophorectomy 2011)? Anastrozole 1 mg daily started June 2016 stopped July 2016 due to severe headaches ?? ?Current treatment: Unable to tolerate antiestrogen therapy. ?Severe anxiety: followed by PCP ?Severe fatigue which is chronic ?? ?Breast cancer surveillance: ?1.??Breast exam 04/19/2021: Benign ?2.?mammogram  11/30/2020: No evidence of malignancy breast density category A ? ?She is here today for new left axillary changes and tenderness.  I placed orders for left breast diagnostic mammogram and ultrasound to further evaluate.   ?

## 2021-09-19 ENCOUNTER — Telehealth: Payer: Self-pay

## 2021-09-19 ENCOUNTER — Encounter: Payer: Self-pay | Admitting: Adult Health

## 2021-09-19 NOTE — Telephone Encounter (Signed)
Pt called and LVM requesting results. Attempted to call pt back and LVM for return call. ?

## 2021-10-06 ENCOUNTER — Telehealth: Payer: Self-pay | Admitting: *Deleted

## 2021-10-06 ENCOUNTER — Ambulatory Visit
Admission: RE | Admit: 2021-10-06 | Discharge: 2021-10-06 | Disposition: A | Discharge status: Home / Self Care | Payer: 59 | Source: Ambulatory Visit | Attending: Adult Health | Admitting: Adult Health

## 2021-10-06 DIAGNOSIS — Z170 Estrogen receptor positive status [ER+]: Secondary | ICD-10-CM

## 2021-10-06 NOTE — Telephone Encounter (Signed)
Received call from pt stating she completed mammogram and [place] today and is requesting office f/u with NP to review results in person and discuss other treatment options if results are negative.  Appt scheduled and pt verbalized understanding of appt date and time.  ?

## 2021-10-11 ENCOUNTER — Inpatient Hospital Stay: Payer: 59 | Attending: Adult Health | Admitting: Adult Health

## 2021-10-11 ENCOUNTER — Inpatient Hospital Stay: Payer: 59

## 2021-10-11 ENCOUNTER — Telehealth: Payer: Self-pay | Admitting: Adult Health

## 2021-10-11 ENCOUNTER — Other Ambulatory Visit: Payer: Self-pay

## 2021-10-11 ENCOUNTER — Encounter: Payer: Self-pay | Admitting: Adult Health

## 2021-10-11 VITALS — BP 130/61 | HR 67 | Temp 97.7°F | Resp 16 | Ht 63.0 in | Wt 275.7 lb

## 2021-10-11 DIAGNOSIS — Z170 Estrogen receptor positive status [ER+]: Secondary | ICD-10-CM | POA: Diagnosis not present

## 2021-10-11 DIAGNOSIS — F419 Anxiety disorder, unspecified: Secondary | ICD-10-CM | POA: Insufficient documentation

## 2021-10-11 DIAGNOSIS — C50412 Malignant neoplasm of upper-outer quadrant of left female breast: Secondary | ICD-10-CM | POA: Diagnosis not present

## 2021-10-11 DIAGNOSIS — Z90710 Acquired absence of both cervix and uterus: Secondary | ICD-10-CM | POA: Diagnosis not present

## 2021-10-11 DIAGNOSIS — Z853 Personal history of malignant neoplasm of breast: Secondary | ICD-10-CM | POA: Diagnosis not present

## 2021-10-11 DIAGNOSIS — Z803 Family history of malignant neoplasm of breast: Secondary | ICD-10-CM | POA: Insufficient documentation

## 2021-10-11 DIAGNOSIS — N6332 Unspecified lump in axillary tail of the left breast: Secondary | ICD-10-CM

## 2021-10-11 DIAGNOSIS — Z79899 Other long term (current) drug therapy: Secondary | ICD-10-CM | POA: Diagnosis not present

## 2021-10-11 DIAGNOSIS — R5383 Other fatigue: Secondary | ICD-10-CM | POA: Diagnosis present

## 2021-10-11 LAB — CBC WITH DIFFERENTIAL (CANCER CENTER ONLY)
Abs Immature Granulocytes: 0.02 10*3/uL (ref 0.00–0.07)
Basophils Absolute: 0.1 10*3/uL (ref 0.0–0.1)
Basophils Relative: 1 %
Eosinophils Absolute: 0.1 10*3/uL (ref 0.0–0.5)
Eosinophils Relative: 2 %
HCT: 43.3 % (ref 36.0–46.0)
Hemoglobin: 13.9 g/dL (ref 12.0–15.0)
Immature Granulocytes: 0 %
Lymphocytes Relative: 23 %
Lymphs Abs: 1.3 10*3/uL (ref 0.7–4.0)
MCH: 26.7 pg (ref 26.0–34.0)
MCHC: 32.1 g/dL (ref 30.0–36.0)
MCV: 83.3 fL (ref 80.0–100.0)
Monocytes Absolute: 0.4 10*3/uL (ref 0.1–1.0)
Monocytes Relative: 7 %
Neutro Abs: 3.9 10*3/uL (ref 1.7–7.7)
Neutrophils Relative %: 67 %
Platelet Count: 352 10*3/uL (ref 150–400)
RBC: 5.2 MIL/uL — ABNORMAL HIGH (ref 3.87–5.11)
RDW: 15 % (ref 11.5–15.5)
WBC Count: 5.9 10*3/uL (ref 4.0–10.5)
nRBC: 0 % (ref 0.0–0.2)

## 2021-10-11 LAB — CMP (CANCER CENTER ONLY)
ALT: 14 U/L (ref 0–44)
AST: 15 U/L (ref 15–41)
Albumin: 3.9 g/dL (ref 3.5–5.0)
Alkaline Phosphatase: 91 U/L (ref 38–126)
Anion gap: 5 (ref 5–15)
BUN: 16 mg/dL (ref 6–20)
CO2: 29 mmol/L (ref 22–32)
Calcium: 9.3 mg/dL (ref 8.9–10.3)
Chloride: 104 mmol/L (ref 98–111)
Creatinine: 0.88 mg/dL (ref 0.44–1.00)
GFR, Estimated: >60 mL/min (ref >60)
Glucose, Bld: 87 mg/dL (ref 70–99)
Potassium: 4.5 mmol/L (ref 3.5–5.1)
Sodium: 138 mmol/L (ref 135–145)
Total Bilirubin: 0.3 mg/dL (ref 0.3–1.2)
Total Protein: 7.5 g/dL (ref 6.5–8.1)

## 2021-10-11 LAB — HEMOGLOBIN A1C
Hgb A1c MFr Bld: 5.7 % — ABNORMAL HIGH (ref 4.8–5.6)
Mean Plasma Glucose: 116.89 mg/dL

## 2021-10-11 LAB — VITAMIN B12: Vitamin B-12: 317 pg/mL (ref 180–914)

## 2021-10-11 LAB — TSH: TSH: 2.443 u[IU]/mL (ref 0.350–4.500)

## 2021-10-11 LAB — VITAMIN D 25 HYDROXY (VIT D DEFICIENCY, FRACTURES): Vit D, 25-Hydroxy: 14.75 ng/mL — ABNORMAL LOW (ref 30–100)

## 2021-10-11 NOTE — Telephone Encounter (Signed)
Per 4/18 in basket called and spoke to pt.  Pt confirmed appointment  ?

## 2021-10-11 NOTE — Progress Notes (Signed)
[facility] Follow up: ?  ? ?Pcp, No ?No address on file ? ? ?DIAGNOSIS:  Cancer Staging  ?Breast cancer of upper-outer quadrant of left female breast ([place]) ?Staging form: Breast, AJCC 7th Edition ?- Clinical stage from 07/22/2014: Stage IA (T1c, N0, M0) - Unsigned ?Staged by: Pathologist and managing physician ?Laterality: Left ?Estrogen receptor status: Positive ?Progesterone receptor status: Positive ?HER2 status: Negative ?Stage used in treatment planning: Yes ?National guidelines used in treatment planning: Yes ?Type of national guideline used in treatment planning: NCCN ?- Pathologic: Stage IA (T1c, N0, cM0) - Unsigned ?Laterality: Left ? ? ?SUMMARY OF ONCOLOGIC HISTORY: ?Oncology History  ?Breast cancer of upper-outer quadrant of left female breast ([place])  ?07/14/2014 Mammogram  ? Left breast UOQ 2:00 position: 1.4 x 1.1 x 1.2 cm mass with indistinct, spiculated margins. Right breast negative. ? ?  ?07/14/2014 Breast US  ? 1.5 cm hypoechoic, irregular mass with spiculated margins UOQ left breast at 2:00, 15 cm from nipple. Intramammary LN 1.4 cm superior to the mass (thin cortex) with another IM LN 3 cm inferior to the index mass with mild focal cortical thickening  ? ?  ?07/14/2014 Initial Biopsy  ? Left breast needle core biopsy (UOQ, 2:00 position): IDC with DCIS; grade 2, ER 100%, PR 96%, HER-2 negative ratio 1.17, Ki-67 17%;  Intramammary LN needle core biopsy (outer left breast, posterior 3:00 position): negative for malignancy. ? ?  ?07/24/2014 Breast MRI  ? Left breast: 1.4 x 1.4 x 1.3 cm enhancing mass with irregular borders at 3 o'clock position. Right breast with no abnormal enhancement. No abnormal appearing LNs.  ? ?  ?07/29/2014 Procedure  ? Genetic counseling/testing: Breast/Ovarian cancer panel ([facility]) shows no clinically significant variants in ATM, BARD1, BRCA1, BRCA2, BRIP1, CDH1, CHEK2, EPCAM, FANCC, MLH1, MSH2, MSH6, NBN, PALB2, PMS2, PTEN, RAD51C, RAD51D, STK11, TP53, and  XRCC2.     ? ?  ?08/06/2014 Surgery  ? 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 Negative margins. ER+ (100%), PR+ (96%), HER-2 repeated and negative by FISH.  Ki-67 17%. ? ?  ?08/06/2014 Clinical Stage  ? Stage IA: T1c, N0 M0 ? ?  ?08/06/2014 Oncotype testing  ? Recurrence score: 7 (6% ROR).  No chemo (Gudena).  ? ?  ?09/16/2014 - 11/09/2014 Radiation Therapy  ? Adjuvant RT completed Pablo Ledger). Left breast/ 50.4 Gy at 1.8 Gy per fraction x 28 fractions.   Left breast boost/ 10 Gy at 2 Gy per fraction x 5 fractions ? ?  ?11/25/2014 - 01/08/2015 Anti-estrogen oral therapy  ? Anastrozole 1 mg daily stopped due to headaches ?  ? ? ?CURRENT THERAPY: ? ?INTERVAL HISTORY: ?[name] 46 y.o. female returns for follow-up after her recent mammogram and ultrasound to look at the area of thickness noted in her left axillary tail.  She underwent the mammogram and ultrasound which was negative for malignancy.  She tells me that she still has the thickness and concerned about her breast and she is worried that something else could be going on.   ? ?She has also been increasingly fatigued.  She has difficulty sleeping at night she thinks is due to anxiety.  She does not have a primary care provider but tells me that she plans to establish with one and asked for referral today. ? ?She works at [employer] in [place].  Her job requirements include lifting totes from 10-50 pounds  throughout her 6-10 hour workday.  Standing is required for the entire shift.  She has been out of work due to her increased fatigue, inability to sleep and tenderness in her breast.   ? ? ? ? ?Patient Active Problem List  ? Diagnosis Date Noted  ? History of breast cancer 10/23/2019  ? Anxiety 07/05/2015  ? Breast cancer of upper-outer quadrant of left female breast ([place]) 07/16/2014  ? Premature surgical menopause on hormone replacement  therapy 12/20/2009  ? SEIZURE DISORDER 07/07/2009  ? DEPRESSION, RECURRENT 08/24/2008  ? ? ?has No Known Allergies. ? ?MEDICAL HISTORY: ?Past Medical History:  ?Diagnosis Date  ? Asthma   ? no current med.  ? Breast cancer of upper-outer quadrant of left female breast ([place]) 07/16/2014  ? Heart murmur   ? as a child  ? History of seizures   ? during pregnancies; last seizure 3 yrs. ago  ? Personal history of radiation therapy 2016  ? Seizures ([place])   ? no medications  ? Stress headaches   ? ? ?SURGICAL HISTORY: ?Past Surgical History:  ?Procedure Laterality Date  ? ABDOMINAL HYSTERECTOMY    ? BREAST BIOPSY Left 07/14/2014  ? x2  ? BREAST BIOPSY Left 08/21/2016  ? BREAST LUMPECTOMY Left 08/06/2014  ? BREAST SURGERY    ? lumpectomy and lymph nodes  ? CESAREAN SECTION  [id number]  ? CESAREAN SECTION WITH BILATERAL TUBAL LIGATION  03/03/2003  ? CYSTO  12/20/2009  ? HYSTEROSCOPY W/ ENDOMETRIAL ABLATION  11/11/2007  ? LYSIS OF ADHESION  12/20/2009  ? TOTAL ABDOMINAL HYSTERECTOMY W/ BILATERAL SALPINGOOPHORECTOMY  12/20/2009  ? TYMPANOSTOMY TUBE PLACEMENT    ? ? ?SOCIAL HISTORY: ?Social History  ? ?Socioeconomic History  ? Marital status: Single  ?  Spouse name: Not on file  ? Number of children: 3  ? Years of education: Not on file  ? Highest education level: Not on file  ?Occupational History  ? Not on file  ?Tobacco Use  ? Smoking status: Never  ? Smokeless tobacco: Never  ?Vaping Use  ? Vaping Use: Never used  ?Substance and Sexual Activity  ? Alcohol use: Not Currently  ?  Comment: rarely  ? Drug use: No  ? Sexual activity: Yes  ?  Birth control/protection: None  ?Other Topics Concern  ? Not on file  ?Social History Narrative  ? Not on file  ? ?Social Determinants of Health  ? ?Financial Resource Strain: Not on file  ?Food Insecurity: Not on file  ?Transportation Needs: Not on file  ?Physical Activity: Not on file  ?Stress: Not on file  ?Social Connections: Not on file  ?Intimate Partner Violence: Not on file  ? ? ?FAMILY  HISTORY: ?Family History  ?Problem Relation Age of Onset  ? Hypertension Mother   ? Breast cancer Other   ?     MGM's sister  ? Breast cancer Maternal Grandmother   ?     dx in her 71s  ? ? ?Review of Systems  ?Constitutional:  Negative for appetite change, chills, fatigue, fever and unexpected weight change.  ?HENT:   Negative for hearing loss, lump/mass and trouble swallowing.   ?Eyes:  Negative for eye problems and icterus.  ?Respiratory:  Negative for chest tightness, cough and shortness of breath.   ?Cardiovascular:  Negative for chest pain, leg swelling and palpitations.  ?Gastrointestinal:  Negative for abdominal distention, abdominal pain, constipation, diarrhea, nausea and vomiting.  ?Endocrine: Negative for hot flashes.  ?Genitourinary:  Negative for difficulty  urinating.   ?Musculoskeletal:  Negative for arthralgias.  ?Skin:  Negative for itching and rash.  ?Neurological:  Negative for dizziness, extremity weakness, headaches and numbness.  ?Hematological:  Negative for adenopathy. Does not bruise/bleed easily.  ?Psychiatric/Behavioral:  Negative for depression. The patient is not nervous/anxious.    ? ? ?PHYSICAL EXAMINATION ? ?ECOG PERFORMANCE STATUS: 1 - Symptomatic but completely ambulatory ? ?Vitals:  ? 10/11/21 0949  ?BP: 130/61  ?Pulse: 67  ?Resp: 16  ?Temp: 97.7 ?F (36.5 ?C)  ?SpO2: 100%  ? ? ?Physical Exam ?Constitutional:   ?   General: She is not in acute distress. ?   Appearance: Normal appearance. She is not toxic-appearing.  ?HENT:  ?   Head: Normocephalic and atraumatic.  ?Eyes:  ?   General: No scleral icterus. ?Cardiovascular:  ?   Rate and Rhythm: Normal rate and regular rhythm.  ?   Pulses: Normal pulses.  ?   Heart sounds: Normal heart sounds.  ?Pulmonary:  ?   Effort: Pulmonary effort is normal.  ?   Breath sounds: Normal breath sounds.  ?Chest:  ?   Comments: I do feel a difference in the area that she is describing.  There is no redness or fluctuance to this area.  There is no sign  of infection that I note. ?Abdominal:  ?   General: Abdomen is flat. Bowel sounds are normal. There is no distension.  ?   Palpations: Abdomen is soft.  ?   Tenderness: There is no abdominal tenderness.  ?Mus

## 2021-10-11 NOTE — Assessment & Plan Note (Signed)
Left breast lumpectomy: Invasive ductal carcinoma with DCIS, no LVI, 2 sentinel nodes negative, 1.3 cm, grade 1, 100%, PR 96 wasn't, HER-2 negative Ki-67 17% T1 cN0 M0 stage IA Oncotype DX recurrence score is 7, 6% ROR status post radiation completed May 2016, patient had hysterectomy with bilateral salpingo-oophorectomy 2011)? Anastrozole 1 mg daily started June 2016 stopped July 2016 due to severe headaches ?? ?Current treatment: Unable to tolerate antiestrogen therapy. ?Severe anxiety: followed by PCP ?Severe fatigue which is chronic ?? ? ?_________________________________________________________________________________ ? ?[name] is here for follow-up of her left breast change.  She is very concerned about this.  Although her mammogram and ultrasound did not reveal anything there is a persistent skin thickening in that area.  Due to her young age, I ordered an MRI to further evaluate. ? ?[name] wants to get in with primary care and I placed a referral for her to do so. ? ?She feels able to return to work and we wrote the appropriate paperwork for her to do go back. ? ?I also placed some lab testing to further evaluate her fatigue.   ? ?We will touch base in 2 weeks for a virtual f/u visit.   ?

## 2021-10-12 ENCOUNTER — Telehealth: Payer: Self-pay

## 2021-10-12 DIAGNOSIS — Z170 Estrogen receptor positive status [ER+]: Secondary | ICD-10-CM

## 2021-10-12 NOTE — Telephone Encounter (Signed)
Duplicate

## 2021-10-12 NOTE — Telephone Encounter (Signed)
-----   Message from Gardenia Phlegm, NP sent at 10/11/2021  8:55 PM EDT ----- ?Please let patient know her vitamin d level is very low.  Recommend she take 5000 IU oral nightly ? ?She also still has prediabetes and I recommend she f/u with Primary Care when she can get an appt scheduled. ? ?Please have her cbc cmet and vit d rechecked when she returns.  ?----- Message ----- ?From: Interface, Lab In [place] ?Sent: 10/11/2021  10:59 AM EDT ?To: Gardenia Phlegm, NP ? ? ?

## 2021-10-12 NOTE — Telephone Encounter (Signed)
-----   Message from Gardenia Phlegm, NP sent at 10/11/2021  8:55 PM EDT ----- ?Please let patient know her vitamin d level is very low.  Recommend she take 5000 IU oral nightly ? ?She also still has prediabetes and I recommend she f/u with Primary Care when she can get an appt scheduled. ? ?Please have her cbc cmet and vit d rechecked when she returns.  ?----- Message ----- ?From: Interface, Lab In [place] ?Sent: 10/11/2021  10:59 AM EDT ?To: Gardenia Phlegm, NP ? ? ?

## 2021-10-12 NOTE — Telephone Encounter (Signed)
Called and left pt VM regarding LC recommendations, per lab results.  Vit D 5000 IU nightly and f/u with PCP regarding prediabetes.  Requested pt call back with concerns.  ?

## 2021-10-17 ENCOUNTER — Encounter: Payer: Self-pay | Admitting: *Deleted

## 2021-10-17 ENCOUNTER — Telehealth: Payer: Self-pay | Admitting: *Deleted

## 2021-10-17 NOTE — Telephone Encounter (Signed)
Received VM from pt.  RN attempt x1 to return call.  No answer, LVM for pt to return call to the office.  °

## 2021-10-20 ENCOUNTER — Encounter (HOSPITAL_COMMUNITY): Payer: Self-pay

## 2021-10-20 ENCOUNTER — Ambulatory Visit (HOSPITAL_COMMUNITY)
Admission: RE | Admit: 2021-10-20 | Discharge: 2021-10-20 | Disposition: A | Discharge status: Home / Self Care | Payer: 59 | Source: Ambulatory Visit | Attending: Adult Health | Admitting: Adult Health

## 2021-10-20 DIAGNOSIS — N6332 Unspecified lump in axillary tail of the left breast: Secondary | ICD-10-CM

## 2021-10-20 NOTE — Progress Notes (Signed)
Pt's breast too large to fit into MRI coil here at [facility]. Pt had prior MRI breast at GI. Pt given centralized scheduling and instructed to call to be placed on their list. Unable to scan pt.  ?

## 2021-10-31 ENCOUNTER — Inpatient Hospital Stay (HOSPITAL_BASED_OUTPATIENT_CLINIC_OR_DEPARTMENT_OTHER): Payer: 59 | Admitting: Adult Health

## 2021-10-31 DIAGNOSIS — E559 Vitamin D deficiency, unspecified: Secondary | ICD-10-CM | POA: Insufficient documentation

## 2021-10-31 DIAGNOSIS — R7303 Prediabetes: Secondary | ICD-10-CM | POA: Insufficient documentation

## 2021-10-31 DIAGNOSIS — Z79811 Long term (current) use of aromatase inhibitors: Secondary | ICD-10-CM | POA: Insufficient documentation

## 2021-10-31 DIAGNOSIS — Z170 Estrogen receptor positive status [ER+]: Secondary | ICD-10-CM | POA: Insufficient documentation

## 2021-10-31 DIAGNOSIS — C50412 Malignant neoplasm of upper-outer quadrant of left female breast: Secondary | ICD-10-CM | POA: Insufficient documentation

## 2021-10-31 NOTE — Assessment & Plan Note (Signed)
2 weeks ago when we checked her labs we also checked to hemoglobin A1c which indicated that she has prediabetes.  I reviewed with her today what this means in regard to her overall health.  She is establishing with primary care on May 18. ?

## 2021-10-31 NOTE — Assessment & Plan Note (Signed)
Left breast lumpectomy: Invasive ductal carcinoma with DCIS, no LVI, 2 sentinel nodes negative, 1.3 cm, grade 1, 100%, PR 96 wasn't, HER-2 negative Ki-67 17% T1 cN0 M0 stage IA Oncotype DX recurrence score is 7, 6% ROR status post radiation completed May 2016, patient had hysterectomy with bilateral salpingo-oophorectomy 2011)? Anastrozole 1 mg daily started June 2016 stopped July 2016 due to severe headaches ?? ?Current treatment: Unable to tolerate antiestrogen therapy. ?Severe anxiety: followed by PCP ?Severe fatigue which is chronic ?? ? ?_________________________________________________________________________________ ? ?Minerva has had a change in her breast.  I have ordered breast MRI and that is pending.  I let her know that we will call her with her MRI results and she has follow-up with Dr. Gudena in October 2023. ?

## 2021-10-31 NOTE — Assessment & Plan Note (Signed)
Her vitamin D level 2 weeks ago was 14.  I have recommended supplementation with 5000 IU of over-the-counter vitamin D3 nightly.  I recommended that she repeat this lab in approximately 3 months.  Since she is establishing with primary care she is going to see if she can get the labs tested with her.  We are happy to schedule for repeat labs at our office as well.  She will let me know what she decides. ?

## 2021-10-31 NOTE — Progress Notes (Signed)
[facility] Follow up: ?  ? ?Pcp, No ?No address on file ? ?I connected with [name] on 10/31/21 at 11:15 AM EDT by video and verified that I am speaking with the correct person using two identifiers.  ?I discussed the limitations, risks, security and privacy concerns of performing an evaluation and management service virtually and the availability of in person appointments.  ?I also discussed with the patient that there may be a patient responsible charge related to this service. The patient expressed understanding and agreed to proceed.  ? ?Patient location: In car parked in ,[place] ?Provider location: [facility] office ?Others participating in call: none ? ? ?DIAGNOSIS:  Cancer Staging  ?Breast cancer of upper-outer quadrant of left female breast ([facility]) ?Staging form: Breast, AJCC 7th Edition ?- Clinical stage from 07/22/2014: Stage IA (T1c, N0, M0) - Unsigned ?Staged by: Pathologist and managing physician ?Laterality: Left ?Estrogen receptor status: Positive ?Progesterone receptor status: Positive ?HER2 status: Negative ?Stage used in treatment planning: Yes ?National guidelines used in treatment planning: Yes ?Type of national guideline used in treatment planning: NCCN ?- Pathologic: Stage IA (T1c, N0, cM0) - Unsigned ?Laterality: Left ? ? ?SUMMARY OF ONCOLOGIC HISTORY: ?Oncology History  ?Breast cancer of upper-outer quadrant of left female breast ([facility])  ?07/14/2014 Mammogram  ? Left breast UOQ 2:00 position: 1.4 x 1.1 x 1.2 cm mass with indistinct, spiculated margins. Right breast negative. ? ?  ?07/14/2014 Breast US  ? 1.5 cm hypoechoic, irregular mass with spiculated margins UOQ left breast at 2:00, 15 cm from nipple. Intramammary LN 1.4 cm superior to the mass (thin cortex) with another IM LN 3 cm inferior to the index mass with mild focal cortical thickening  ? ?  ?07/14/2014 Initial Biopsy  ? Left breast needle core biopsy (UOQ, 2:00 position): IDC with DCIS; grade 2, ER 100%, PR  96%, HER-2 negative ratio 1.17, Ki-67 17%;  Intramammary LN needle core biopsy (outer left breast, posterior 3:00 position): negative for malignancy. ? ?  ?07/24/2014 Breast MRI  ? Left breast: 1.4 x 1.4 x 1.3 cm enhancing mass with irregular borders at 3 o'clock position. Right breast with no abnormal enhancement. No abnormal appearing LNs.  ? ?  ?07/29/2014 Procedure  ? Genetic counseling/testing: Breast/Ovarian cancer panel ([facility]) shows no clinically significant variants in ATM, BARD1, BRCA1, BRCA2, BRIP1, CDH1, CHEK2, EPCAM, FANCC, MLH1, MSH2, MSH6, NBN, PALB2, PMS2, PTEN, RAD51C, RAD51D, STK11, TP53, and XRCC2.     ? ?  ?08/06/2014 Surgery  ? 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 Negative margins. ER+ (100%), PR+ (96%), HER-2 repeated and negative by FISH.  Ki-67 17%. ? ?  ?08/06/2014 Clinical Stage  ? Stage IA: T1c, N0 M0 ? ?  ?08/06/2014 Oncotype testing  ? Recurrence score: 7 (6% ROR).  No chemo (Gudena).  ? ?  ?09/16/2014 - 11/09/2014 Radiation Therapy  ? Adjuvant RT completed ([place]). Left breast/ 50.4 Gy at 1.8 Gy per fraction x 28 fractions.   Left breast boost/ 10 Gy at 2 Gy per fraction x 5 fractions ? ?  ?11/25/2014 - 01/08/2015 Anti-estrogen oral therapy  ? Anastrozole 1 mg daily stopped due to headaches ?  ? ? ?CURRENT THERAPY: breast changes/lab results ? ?INTERVAL HISTORY: ?[name] 46 y.o. female returns for follow up of lab results.  Her vitamin d level was 14 and she started taking otc 5,000 IU daily.  She is   tolerating it well.  She had nausea the first two days that has since subsided.  At her last appointment we had ordered MRI of the breast that was scheduled at [facility] on April 27.  When she arrived she learned that the MRI machine could not accommodate breasts of her size.  Due to this her MRI has been rescheduled for the [place] location on May 16.  She also has a new  patient appointment with primary care on May 18. ? ? ?Patient Active Problem List  ? Diagnosis Date Noted  ? Vitamin D deficiency 10/31/2021  ? Prediabetes 10/31/2021  ? History of breast cancer 10/23/2019  ? Anxiety 07/05/2015  ? Breast cancer of upper-outer quadrant of left female breast ([facility]) 07/16/2014  ? 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 12/20/2009  ? SEIZURE DISORDER 07/07/2009  ? DEPRESSION, RECURRENT 08/24/2008  ? ? ?has No Known Allergies. ? ?MEDICAL HISTORY: ?Past Medical History:  ?Diagnosis Date  ? Asthma   ? no current med.  ? Breast cancer of upper-outer quadrant of left female breast ([facility]) 07/16/2014  ? Heart murmur   ? as a child  ? History of seizures   ? during pregnancies; last seizure 3 yrs. ago  ? Personal history of radiation therapy 2016  ? Seizures ([facility])   ? no medications  ? Stress headaches   ? ? ?SURGICAL HISTORY: ?Past Surgical History:  ?Procedure Laterality Date  ? ABDOMINAL HYSTERECTOMY    ? BREAST BIOPSY Left 07/14/2014  ? x2  ? BREAST BIOPSY Left 08/21/2016  ? BREAST LUMPECTOMY Left 08/06/2014  ? BREAST SURGERY    ? lumpectomy and lymph nodes  ? CESAREAN SECTION  1997,1999  ? CESAREAN SECTION WITH BILATERAL TUBAL LIGATION  03/03/2003  ? CYSTO  12/20/2009  ? HYSTEROSCOPY W/ ENDOMETRIAL ABLATION  11/11/2007  ? LYSIS OF ADHESION  12/20/2009  ? TOTAL ABDOMINAL HYSTERECTOMY W/ BILATERAL SALPINGOOPHORECTOMY  12/20/2009  ? TYMPANOSTOMY TUBE PLACEMENT    ? ? ?SOCIAL HISTORY: ?Social History  ? ?Socioeconomic History  ? Marital status: Single  ?  Spouse name: Not on file  ? Number of children: 3  ? Years of education: Not on file  ? Highest education level: Not on file  ?Occupational History  ? Not on file  ?Tobacco Use  ? Smoking status: Never  ? Smokeless tobacco: Never  ?Vaping Use  ? Vaping Use: Never used  ?Substance and Sexual Activity  ? Alcohol use: Not Currently  ?  Comment: rarely  ? Drug use: No  ? Sexual activity: Yes  ?  Birth control/protection: None  ?Other  Topics Concern  ? Not on file  ?Social History Narrative  ? Not on file  ? ?Social Determinants of Health  ? ?Financial Resource Strain: Not on file  ?Food Insecurity: Not on file  ?Transportation Needs: Not on file  ?Physical Activity: Not on file  ?Stress: Not on file  ?Social Connections: Not on file  ?Intimate Partner Violence: Not on file  ? ? ?FAMILY HISTORY: ?Family History  ?Problem Relation Age of Onset  ? Hypertension Mother   ? Breast cancer Other   ?     MGM's sister  ? Breast cancer Maternal Grandmother   ?     dx in her 40s  ? ? ?Review of Systems  ?Constitutional:  Positive for fatigue. Negative for appetite change, chills, fever and unexpected weight change.  ?HENT:   Negative for hearing loss, lump/mass and trouble swallowing.   ?  Eyes:  Negative for eye problems and icterus.  ?Respiratory:  Negative for chest tightness, cough and shortness of breath.   ?Cardiovascular:  Negative for chest pain, leg swelling and palpitations.  ?Gastrointestinal:  Negative for abdominal distention, abdominal pain, constipation, diarrhea, nausea and vomiting.  ?Endocrine: Negative for hot flashes.  ?Genitourinary:  Negative for difficulty urinating.   ?Musculoskeletal:  Negative for arthralgias.  ?Skin:  Negative for itching and rash.  ?Neurological:  Negative for dizziness, extremity weakness, headaches and numbness.  ?Hematological:  Negative for adenopathy. Does not bruise/bleed easily.  ?Psychiatric/Behavioral:  Negative for depression. The patient is not nervous/anxious.    ? ? ?PHYSICAL EXAMINATION ? ?ECOG PERFORMANCE STATUS: 1 - Symptomatic but completely ambulatory ?Patient appears well she is in no apparent distress.  Mood and behavior are normal.  Speech is normal.  Breathing appears nonlabored. ? ? ? ?ASSESSMENT and THERAPY PLAN:  ? ?Vitamin D deficiency ?Her vitamin D level 2 weeks ago was 14.  I have recommended supplementation with 5000 IU of over-the-counter vitamin D3 nightly.  I recommended that she  repeat this lab in approximately 3 months.  Since she is establishing with primary care she is going to see if she can get the labs tested with her.  We are happy to schedule for repeat labs at our office as

## 2021-11-08 ENCOUNTER — Ambulatory Visit (HOSPITAL_COMMUNITY)
Admission: RE | Admit: 2021-11-08 | Discharge: 2021-11-08 | Disposition: A | Discharge status: Home / Self Care | Payer: 59 | Source: Ambulatory Visit | Attending: Adult Health | Admitting: Adult Health

## 2021-11-08 DIAGNOSIS — N6332 Unspecified lump in axillary tail of the left breast: Secondary | ICD-10-CM | POA: Insufficient documentation

## 2021-11-08 MED ORDER — GADOBUTROL 1 MMOL/ML IV SOLN
10.0000 mL | Freq: Once | INTRAVENOUS | Status: AC | PRN
  Administered 2021-11-08: 10 mL via INTRAVENOUS

## 2021-11-10 ENCOUNTER — Encounter: Payer: Self-pay | Admitting: Family Medicine

## 2021-11-10 ENCOUNTER — Ambulatory Visit (INDEPENDENT_AMBULATORY_CARE_PROVIDER_SITE_OTHER): Payer: 59 | Admitting: Family Medicine

## 2021-11-10 VITALS — BP 124/86 | HR 63 | Temp 97.6°F | Ht 63.0 in | Wt 275.0 lb

## 2021-11-10 DIAGNOSIS — R7303 Prediabetes: Secondary | ICD-10-CM

## 2021-11-10 DIAGNOSIS — J4531 Mild persistent asthma with (acute) exacerbation: Secondary | ICD-10-CM | POA: Insufficient documentation

## 2021-11-10 DIAGNOSIS — R058 Other specified cough: Secondary | ICD-10-CM

## 2021-11-10 DIAGNOSIS — J4520 Mild intermittent asthma, uncomplicated: Secondary | ICD-10-CM | POA: Insufficient documentation

## 2021-11-10 DIAGNOSIS — R062 Wheezing: Secondary | ICD-10-CM

## 2021-11-10 MED ORDER — ALBUTEROL SULFATE HFA 108 (90 BASE) MCG/ACT IN AERS: 1.0000 | INHALATION_SPRAY | Freq: Four times a day (QID) | RESPIRATORY_TRACT | 0 refills | Status: DC | PRN

## 2021-11-10 MED ORDER — AZITHROMYCIN 250 MG PO TABS: ORAL_TABLET | ORAL | 0 refills | Status: AC

## 2021-11-10 MED ORDER — FLUTICASONE PROPIONATE HFA 110 MCG/ACT IN AERO: 2.0000 | INHALATION_SPRAY | Freq: Two times a day (BID) | RESPIRATORY_TRACT | 1 refills | Status: DC

## 2021-11-10 NOTE — Assessment & Plan Note (Signed)
Hgb A1c 5.7%

## 2021-11-10 NOTE — Progress Notes (Signed)
New Patient Office Visit  Subjective    Patient ID: [name], female    DOB: 01/19/1976  Age: 46 y.o. MRN: [id number]  CC:  Chief Complaint  Patient presents with   Establish Care    Would like to make sure she doesn't have bronchitis, has had a productive cough with green sputum for about a week now accompanied by some wheezing.      HPI [name] presents to establish care.   Other providers: [facility]. Breast cancer in 2016.   Seasonal allergies- takes Claritin.  Asthma- only an issue when she gets sick. Using albuterol inhaler. Has needed it 6-7 times in the past week.   Complains of a one week hx of productive cough, sneezing, itchy and watery eyes, scratchy throat. Some nausea due to mucus.   No fever, chills, dizziness, chest pain, palpitations, shortness of breath, abdominal pain, vomiting or diarrhea.   She would like to discuss weight loss and recent new diagnosis of prediabetes. A1c 5.7%.   Works at a distribution center. Single.  3 kids. 3 grandchildren.   Outpatient Encounter Medications as of 11/10/2021  Medication Sig   azithromycin (ZITHROMAX) 250 MG tablet Take 2 tablets on day 1, then 1 tablet daily on days 2 through 5   famotidine (PEPCID) 10 MG tablet Take 10 mg by mouth daily.   fluticasone (FLOVENT HFA) 110 MCG/ACT inhaler Inhale 2 puffs into the lungs in the morning and at bedtime.   naproxen sodium (ALEVE) 220 MG tablet Take 220 mg by mouth as needed (pain).   [DISCONTINUED] albuterol (VENTOLIN HFA) 108 (90 Base) MCG/ACT inhaler Inhale 1-2 puffs into the lungs every 6 (six) hours as needed for wheezing or shortness of breath.   albuterol (VENTOLIN HFA) 108 (90 Base) MCG/ACT inhaler Inhale 1-2 puffs into the lungs every 6 (six) hours as needed for wheezing or shortness of breath.   [DISCONTINUED] fluticasone (FLONASE) 50 MCG/ACT nasal spray Place 1-2 sprays into both nostrils daily.   [DISCONTINUED] promethazine-dextromethorphan  (PROMETHAZINE-DM) 6.25-15 MG/5ML syrup Take 5 mLs by mouth 4 (four) times daily as needed for cough. (Patient not taking: Reported on 09/15/2021)   No facility-administered encounter medications on file as of 11/10/2021.    Past Medical History:  Diagnosis Date   Asthma    no current med.   Breast cancer of upper-outer quadrant of left female breast ([place]) 07/16/2014   Heart murmur    as a child   History of seizures    during pregnancies; last seizure 3 yrs. ago   Personal history of radiation therapy 2016   Seizures ([place])    no medications   Stress headaches     Past Surgical History:  Procedure Laterality Date   ABDOMINAL HYSTERECTOMY     BREAST BIOPSY Left 07/14/2014   x2   BREAST BIOPSY Left 08/21/2016   BREAST LUMPECTOMY Left 08/06/2014   BREAST SURGERY     lumpectomy and lymph nodes   CESAREAN SECTION  1191,4782   CESAREAN SECTION WITH BILATERAL TUBAL LIGATION  03/03/2003   CYSTO  12/20/2009   HYSTEROSCOPY W/ ENDOMETRIAL ABLATION  11/11/2007   LYSIS OF ADHESION  12/20/2009   TOTAL ABDOMINAL HYSTERECTOMY W/ BILATERAL SALPINGOOPHORECTOMY  12/20/2009   TYMPANOSTOMY TUBE PLACEMENT      Family History  Problem Relation Age of Onset   Hypertension Mother    Breast cancer Other        MGM's sister   Breast cancer Maternal Grandmother  dx in her 6s    Social History   Socioeconomic History   Marital status: Single    Spouse name: Not on file   Number of children: 3   Years of education: Not on file   Highest education level: Not on file  Occupational History   Not on file  Tobacco Use   Smoking status: Never   Smokeless tobacco: Never  Vaping Use   Vaping Use: Never used  Substance and Sexual Activity   Alcohol use: Not Currently    Comment: rarely   Drug use: No   Sexual activity: Yes    Birth control/protection: None  Other Topics Concern   Not on file  Social History Narrative   Not on file   Social Determinants of Health   Financial Resource  Strain: Not on file  Food Insecurity: Not on file  Transportation Needs: Not on file  Physical Activity: Not on file  Stress: Not on file  Social Connections: Not on file  Intimate Partner Violence: Not on file    ROS Pertinent positives and negatives in the history of present illness.      Objective    BP 124/86 (BP Location: Right Arm, Patient Position: Sitting, Cuff Size: Large)   Pulse 63   Temp 97.6 F (36.4 C) (Temporal)   Ht '5\' 3"'$  (1.6 m)   Wt 275 lb (124.7 kg)   SpO2 98%   BMI 48.71 kg/m   Physical Exam Constitutional:      General: She is not in acute distress.    Appearance: She is obese. She is not ill-appearing.  HENT:     Mouth/Throat:     Pharynx: Posterior oropharyngeal erythema present.  Eyes:     Conjunctiva/sclera: Conjunctivae normal.     Pupils: Pupils are equal, round, and reactive to light.  Cardiovascular:     Rate and Rhythm: Normal rate.     Pulses: Normal pulses.  Pulmonary:     Effort: Pulmonary effort is normal.     Breath sounds: Normal breath sounds.  Musculoskeletal:     Cervical back: Normal range of motion and neck supple.  Lymphadenopathy:     Cervical: No cervical adenopathy.  Skin:    General: Skin is warm and dry.  Neurological:     General: No focal deficit present.     Mental Status: She is alert and oriented to person, place, and time.  Psychiatric:        Mood and Affect: Mood normal.        Behavior: Behavior normal.        Assessment & Plan:   Problem List Items Addressed This Visit       Respiratory   Mild persistent asthma with acute exacerbation - Primary    Continue using albuterol as needed. Start on Flovent inhaler. Rinse mouth after use. Follow up if worsening.        Relevant Medications   fluticasone (FLOVENT HFA) 110 MCG/ACT inhaler   albuterol (VENTOLIN HFA) 108 (90 Base) MCG/ACT inhaler     Other   Morbid obesity ([place])    She may check with her insurance regarding weight loss  medication.        Prediabetes    Hgb A1c 5.7%       Productive cough    Zpak prescribed. May use OTC cough medication as well        Relevant Medications   azithromycin (ZITHROMAX) 250 MG tablet   Other Visit  Diagnoses     Wheezing       Relevant Medications   fluticasone (FLOVENT HFA) 110 MCG/ACT inhaler   albuterol (VENTOLIN HFA) 108 (90 Base) MCG/ACT inhaler       Return if symptoms worsen or fail to improve.   Harland Dingwall, NP-C

## 2021-11-10 NOTE — Assessment & Plan Note (Signed)
She may check with her insurance regarding weight loss medication.

## 2021-11-10 NOTE — Patient Instructions (Signed)
Take the antibiotic as prescribed.  Use the Flovent steroid inhaler as prescribed.  Rinse your mouth out after each use.  Continue using your albuterol inhaler as needed.  Follow-up if you are getting worse or not improving significantly in the next week.  Check with your insurance company regarding weight loss medications to see what would be affordable.  Some of the medications you should ask about include Ozempic or Mounjaro  Follow up if you would like to further discuss weight loss.

## 2021-11-10 NOTE — Assessment & Plan Note (Signed)
Zpak prescribed. May use OTC cough medication as well

## 2021-11-10 NOTE — Assessment & Plan Note (Signed)
Continue using albuterol as needed. Start on Flovent inhaler. Rinse mouth after use. Follow up if worsening.

## 2021-11-11 ENCOUNTER — Telehealth: Payer: Self-pay

## 2021-11-11 NOTE — Telephone Encounter (Signed)
Called and left VM for pt, per [facility] MRI negative.

## 2021-11-11 NOTE — Telephone Encounter (Signed)
-----   Message from Gardenia Phlegm, NP sent at 11/09/2021 11:06 AM EDT ----- Mri negative, please notify patient  ----- Message ----- From: Buel Ream, Rad Results In Sent: 11/09/2021  10:55 AM EDT To: Gardenia Phlegm, NP

## 2021-11-16 ENCOUNTER — Encounter: Payer: Self-pay | Admitting: Family Medicine

## 2021-11-16 ENCOUNTER — Ambulatory Visit (INDEPENDENT_AMBULATORY_CARE_PROVIDER_SITE_OTHER): Payer: 59 | Admitting: Family Medicine

## 2021-11-16 VITALS — BP 116/84 | HR 62 | Temp 97.2°F | Ht 63.0 in | Wt 273.0 lb

## 2021-11-16 DIAGNOSIS — R0989 Other specified symptoms and signs involving the circulatory and respiratory systems: Secondary | ICD-10-CM

## 2021-11-16 DIAGNOSIS — G245 Blepharospasm: Secondary | ICD-10-CM | POA: Diagnosis not present

## 2021-11-16 DIAGNOSIS — Z87898 Personal history of other specified conditions: Secondary | ICD-10-CM

## 2021-11-16 DIAGNOSIS — R42 Dizziness and giddiness: Secondary | ICD-10-CM | POA: Diagnosis not present

## 2021-11-16 DIAGNOSIS — R32 Unspecified urinary incontinence: Secondary | ICD-10-CM | POA: Diagnosis not present

## 2021-11-16 LAB — POCT URINALYSIS DIPSTICK
Bilirubin, UA: NEGATIVE
Blood, UA: NEGATIVE
Glucose, UA: NEGATIVE
Ketones, UA: NEGATIVE
Leukocytes, UA: NEGATIVE
Nitrite, UA: NEGATIVE
Protein, UA: NEGATIVE
Spec Grav, UA: 1.02 (ref 1.010–1.025)
Urobilinogen, UA: 0.2 E.U./dL
pH, UA: 6.5 (ref 5.0–8.0)

## 2021-11-16 MED ORDER — MECLIZINE HCL 25 MG PO TABS: 25.0000 mg | ORAL_TABLET | Freq: Two times a day (BID) | ORAL | 0 refills | Status: DC | PRN

## 2021-11-16 NOTE — Assessment & Plan Note (Addendum)
Dizziness for the past 3 days, much worse yesterday than today.  She is not orthostatic. Dizziness occurred when looking up and neck extension.  Completed an antibiotic for cough and chest congestion yesterday.  Benign neurological exam.  Dizziness occurs with bending over mainly.  Discussed that her symptoms may be BPPV.  I will treat her with meclizine.  Encourage good hydration.  Change positions slowly.  Follow-up if any new or worsening symptoms arise or if she is not improving in the next 2 to 3 days.  Out of work note provided for yesterday and today.

## 2021-11-16 NOTE — Assessment & Plan Note (Signed)
Seizure activity only during pregnancies.  None since.

## 2021-11-16 NOTE — Assessment & Plan Note (Signed)
Ongoing since last eye exam 2 months ago.  New glasses.  Discussed possible etiologies including eyestrain, dry eyes, allergies, etc.  Recommend she look for triggers and follow-up with her eye doctor here as needed.

## 2021-11-16 NOTE — Patient Instructions (Addendum)
Try the meclizine as prescribe and stay well hydrated.  Change positions slowly.   Follow up if you are not improving by Friday or if you get worse or have any new symptoms.   You will hear from [facility] to schedule a visit.   Contact your eye doctor regarding the eye twitching since this is new since your last exam and glasses.    Vertigo Vertigo is the feeling that you or the things around you are moving when they are not. This feeling can come and go at any time. Vertigo often goes away on its own. This condition can be dangerous if it happens when you are doing activities like driving or working with machines. Your doctor will do tests to find the cause of your vertigo. These tests will also help your doctor decide on the best treatment for you. Follow these instructions at home: Eating and drinking     Drink enough fluid to keep your pee (urine) pale yellow. Do not drink alcohol. Activity Return to your normal activities when your doctor says that it is safe. In the morning, first sit up on the side of the bed. When you feel okay, stand slowly while you hold onto something until you know that your balance is fine. Move slowly. Avoid sudden body or head movements or certain positions, as told by your doctor. Use a cane if you have trouble standing or walking. Sit down right away if you feel dizzy. Avoid doing any tasks or activities that can cause danger to you or others if you get dizzy. Avoid bending down if you feel dizzy. Place items in your home so that they are easy for you to reach without bending or leaning over. Do not drive or use machinery if you feel dizzy. General instructions Take over-the-counter and prescription medicines only as told by your doctor. Keep all follow-up visits. Contact a doctor if: Your medicine does not help your vertigo. Your problems get worse or you have new symptoms. You have a fever. You feel like you may vomit (nauseous), or this  feeling gets worse. You start to vomit. Your family or friends see changes in how you act. You lose feeling (have numbness) in part of your body. You feel prickling and tingling in a part of your body. Get help right away if: You are always dizzy. You faint. You get very bad headaches. You get a stiff neck. Bright light starts to bother you. You have trouble moving or talking. You feel weak in your hands, arms, or legs. You have changes in your hearing or in how you see (vision). These symptoms may be an emergency. Get help right away. Call your local emergency services (911 in the [place] Do not wait to see if the symptoms will go away. Do not drive yourself to the hospital. Summary Vertigo is the feeling that you or the things around you are moving when they are not. Your doctor will do tests to find the cause of your vertigo. You may be told to avoid some tasks, positions, or movements. Contact a doctor if your medicine is not helping, or if you have a fever, new symptoms, or a change in how you act. Get help right away if you get very bad headaches, or if you have changes in how you speak, hear, or see. This information is not intended to replace advice given to you by your health care provider. Make sure you discuss any questions you have with your  health care provider. Document Revised: 05/12/2020 Document Reviewed: 05/12/2020 Elsevier Patient Education  [employer].

## 2021-11-16 NOTE — Progress Notes (Signed)
Subjective:     Patient ID: [name], female    DOB: 06/15/1976, 46 y.o.   MRN: [id number]  Chief Complaint  Patient presents with   Dizziness    Today will be the 3rd day, started when she was at work Monday and got real lightheaded every time she bent over. States now it only happens when she gets up and starts walking and moving around.     Dizziness  Patient is in today for complaints of intermittent dizziness. Dizziness occurs with position changes, mainly bending over. She fell over yesterday morning while bending over. No injury.  States she thinks overall her dizziness is Improving but still felt dizzy this morning when standing up.  No numbness, tingling or weakness. No cognitive difficulty.   Denies fever, chills, headache, ear fullness or pressure, vision changes, chest pain, palpitations, shortness of breath, abdominal pain, LE edema.   States she may have been dehydrated so she has been hydrating. Completed antibiotic yesterday for cough and chest congestion which has improved but still has lingering cough.   Left eye feels like it is twitching but it is not visible.  Eye exam 2 months ago. New glasses.   Hx of seizures with pregnancies but not otherwise.   States she is having to urinate "all the time" since her hysterectomy, 2 years ago. Has not seen urologist but gynecologist said she should. Requests referral.      Health Maintenance Due  Topic Date Due   COLONOSCOPY (Pts 45-24yr Insurance coverage will need to be confirmed)  Never done    Past Medical History:  Diagnosis Date   Asthma    no current med.   [place]ast [place] of [place]outer [place] 07[place]16[place]   Heart murmur    as a child   History of seizures    during pregnancies; last seizure 3 yrs. ago   Personal history of radiation therapy 2016   [place]    no medications   Stress headaches     Past Surgical History:  Procedure Laterality Date    ABDOMINAL HYSTERECTOMY     BREAST BIOPSY Left 07/14/2014   x2   BREAST BIOPSY Left 08/21/2016   BREAST LUMPECTOMY Left 08/06/2014   BREAST SURGERY     lumpectomy and lymph nodes   CESAREAN SECTION  12952,8413  CESAREAN SECTION WITH BILATERAL TUBAL LIGATION  03/03/2003   CYSTO  12/20/2009   HYSTEROSCOPY W/ ENDOMETRIAL ABLATION  11/11/2007   LYSIS OF ADHESION  12/20/2009   TOTAL ABDOMINAL HYSTERECTOMY W/ BILATERAL SALPINGOOPHORECTOMY  12/20/2009   TYMPANOSTOMY TUBE PLACEMENT      Family History  Problem Relation Age of Onset   Hypertension Mother    Breast cancer Other        MGM's sister   Breast cancer Maternal Grandmother        dx in her 462s   Social History   Socioeconomic History   Marital status: Single    Spouse name: Not on file   Number of children: 3   Years of education: Not on file   Highest education level: Not on file  Occupational History   Not on file  Tobacco Use   Smoking status: Never   Smokeless tobacco: Never  Vaping Use   Vaping Use: Never used  Substance and Sexual Activity   Alcohol use: Not Currently    Comment: rarely   Drug use: No   Sexual activity: Yes  Birth control/protection: None  Other Topics Concern   Not on file  Social History Narrative   Not on file   Social Determinants of Health   Financial Resource Strain: Not on file  Food Insecurity: Not on file  Transportation Needs: Not on file  Physical Activity: Not on file  Stress: Not on file  Social Connections: Not on file  Intimate Partner Violence: Not on file    Outpatient Medications Prior to Visit  Medication Sig Dispense Refill   albuterol (VENTOLIN HFA) 108 (90 Base) MCG/ACT inhaler Inhale 1-2 puffs into the lungs every 6 (six) hours as needed for wheezing or shortness of breath. 1 each 0   famotidine (PEPCID) 10 MG tablet Take 10 mg by mouth daily.     fluticasone (FLOVENT HFA) 110 MCG/ACT inhaler Inhale 2 puffs into the lungs in the morning and at bedtime. 1 each  1   naproxen sodium (ALEVE) 220 MG tablet Take 220 mg by mouth as needed (pain).     No facility-administered medications prior to visit.    No Known Allergies  Review of Systems  Neurological:  Positive for dizziness.  Pertinent positives and negatives in the history of present illness.     Objective:    Physical Exam Constitutional:      General: She is not in acute distress.    Appearance: She is not ill-appearing or diaphoretic.  HENT:     Head: Atraumatic.     Right Ear: Tympanic membrane and ear canal normal.     Left Ear: Tympanic membrane and ear canal normal.     Mouth/Throat:     Mouth: Mucous membranes are moist.  Eyes:     General: Lids are normal. Vision grossly intact. No visual field deficit.    Extraocular Movements: Extraocular movements intact.     Conjunctiva/sclera: Conjunctivae normal.     Pupils: Pupils are equal, round, and reactive to light.  Cardiovascular:     Rate and Rhythm: Normal rate and regular rhythm.     Pulses: Normal pulses.  Pulmonary:     Effort: Pulmonary effort is normal.     Breath sounds: Normal breath sounds.  Musculoskeletal:        General: Normal range of motion.     Cervical back: Normal range of motion and neck supple.  Lymphadenopathy:     Cervical: No cervical adenopathy.  Skin:    General: Skin is warm and dry.  Neurological:     Mental Status: She is alert and oriented to person, place, and time.     Cranial Nerves: Cranial nerves 2-12 are intact. No facial asymmetry.     Sensory: Sensation is intact.     Motor: No weakness, abnormal muscle tone or pronator drift.     Coordination: Romberg sign negative. Coordination normal.     Gait: Gait is intact.  Psychiatric:        Mood and Affect: Mood normal.        Behavior: Behavior normal.        Thought Content: Thought content normal.        Judgment: Judgment normal.    BP 116/84 (BP Location: Right Arm, Patient Position: Sitting, Cuff Size: Large)   Pulse 62    Temp (!) 97.2 F (36.2 C) (Temporal)   Ht '5\' 3"'$  (1.6 m)   Wt 273 lb (123.8 kg)   SpO2 98%   BMI 48.36 kg/m  Wt Readings from Last 3 Encounters:  11/16/21 273 lb (  123.8 kg)  11/10/21 275 lb (124.7 kg)  10/11/21 275 lb 11.2 oz (125.1 kg)       Assessment & Plan:   Problem List Items Addressed This Visit       Nervous and Auditory   Eye twitch    Ongoing since last eye exam 2 months ago.  New glasses.  Discussed possible etiologies including eyestrain, dry eyes, allergies, etc.  Recommend she look for triggers and follow-up with her eye doctor here as needed.         Other   Dizziness - Primary    Dizziness for the past 3 days, much worse yesterday than today.  She is not orthostatic. Dizziness occurred when looking up and neck extension.  Completed an antibiotic for cough and chest congestion yesterday.  Benign neurological exam.  Dizziness occurs with bending over mainly.  Discussed that her symptoms may be BPPV.  I will treat her with meclizine.  Encourage good hydration.  Change positions slowly.  Follow-up if any new or worsening symptoms arise or if she is not improving in the next 2 to 3 days.  Out of work note provided for yesterday and today.       Relevant Medications   meclizine (ANTIVERT) 25 MG tablet   History of seizures    Seizure activity only during pregnancies.  None since.       Urinary incontinence    Ongoing x2 years since her hysterectomy.  She also reports having to urinate frequently and possible overactive bladder.  Negative urinalysis dipstick today.  Request referral to urologist.  Referral made.       Relevant Orders   Ambulatory referral to Urology   POCT urinalysis dipstick (Completed)   Other Visit Diagnoses     Chest congestion           I am having Aili M. Aguila start on meclizine. I am also having her maintain her famotidine, naproxen sodium, fluticasone, and albuterol.  Meds ordered this encounter  Medications    meclizine (ANTIVERT) 25 MG tablet    Sig: Take 1 tablet (25 mg total) by mouth 2 (two) times daily as needed for dizziness.    Dispense:  30 tablet    Refill:  0    Order Specific Question:   Supervising Provider    Answer:   Pricilla Holm A [[id number]]

## 2021-11-16 NOTE — Assessment & Plan Note (Addendum)
Ongoing x2 years since her hysterectomy.  She also reports having to urinate frequently and possible overactive bladder.  Negative urinalysis dipstick today.  Request referral to urologist.  Referral made.

## 2021-11-22 ENCOUNTER — Telehealth: Payer: Self-pay | Admitting: *Deleted

## 2021-11-22 NOTE — Telephone Encounter (Signed)
Message left for [name] [phone] (home) as private, urgent request for return call to this nurse.  Inquiring if the intermittent leave of absence requested 11/14/2021 with start date of 10/06/2021 is still needed and details of episodes and duration.  Next scheduled F/U is 04/19/2022 with next annual mammogram due April 2024. Awaiting return call from patient.

## 2021-11-22 NOTE — Telephone Encounter (Signed)
Return call received per other forms nurse.  Connected with [name] who confirms "need for FMLA for appointments.  I still have a knot with continual breast pain.  Use Aleve for pain.  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  Not sure what they will do next.  Mentioned physical therapy and a sleeve.  No, I did not have physical therapy in 2016."  Completed form reviewed and signed by provider.    Connected with patient to notify form is ready for pick up as requested.  Will pick up tomorrow at initial office entry registration COVID-19 screening area.  No fax number provided for office return.

## 2021-11-23 ENCOUNTER — Other Ambulatory Visit: Payer: Self-pay

## 2021-11-23 DIAGNOSIS — Z170 Estrogen receptor positive status [ER+]: Secondary | ICD-10-CM

## 2021-11-23 NOTE — Progress Notes (Signed)
Spoke with pt regarding FMLA and (L) breast pain concerns. Pt states she was advised to have PT if all scans were negative and pt is interested in PT for eval. Order for referral placed. Pt's goal is to obtain intermittent FMLA to cover appts, as she states her employer is becoming disgruntled about her absences. Pt is aware of referral and will call to schedule appt.

## 2021-11-23 NOTE — Telephone Encounter (Signed)
Roz,  Thank you for your message. I have spoken with the pt and now that we know her scans are clear we have placed the PT referral to eval for lymphedema. Pt's goal is to obtain intermittent FMLA and back date the days she was in office so her employer will not count the absences against her. She has a need for FMLA for ongoing breast surveillance. Please let me know if you need more clarification.  -Mickel Baas

## 2021-11-28 ENCOUNTER — Other Ambulatory Visit: Payer: Self-pay | Admitting: Family Medicine

## 2021-11-28 ENCOUNTER — Telehealth: Payer: Self-pay | Admitting: Family Medicine

## 2021-11-28 DIAGNOSIS — R7303 Prediabetes: Secondary | ICD-10-CM

## 2021-11-28 MED ORDER — OZEMPIC (0.25 OR 0.5 MG/DOSE) 2 MG/3ML ~~LOC~~ SOPN: 0.2500 mg | PEN_INJECTOR | SUBCUTANEOUS | 0 refills | Status: DC

## 2021-11-28 NOTE — Telephone Encounter (Signed)
Patient called her insurance company and they agreed to cover ozempic - patient would like this medication sent to [facility] on [address], [place]

## 2021-11-28 NOTE — Telephone Encounter (Signed)
5/18 you all discussed weight loss options and that pt will check with insurance. Pt called and states insurance will cover Ozempic and is requesting a prescription.

## 2021-11-29 MED ORDER — OZEMPIC (0.25 OR 0.5 MG/DOSE) 2 MG/3ML ~~LOC~~ SOPN: 0.2500 mg | PEN_INJECTOR | SUBCUTANEOUS | 0 refills | Status: DC

## 2021-11-29 NOTE — Telephone Encounter (Signed)
Called pt and let her know medication has been sent to her pharmacy. Pt booked f/u appt and asked for it to be switched to [facility] on [address] because that is the pharmacy her insurance told her it should be sent to in order to be covered.   Resent rx to [facility] [address]

## 2021-11-29 NOTE — Progress Notes (Signed)
Pt requested medication be switched to [facility] [address] as her insurance told her it will be covered [facility]. Resent rx to [facility] [address] and discontinued rx at [facility]

## 2021-11-30 NOTE — Therapy (Signed)
OUTPATIENT PHYSICAL THERAPY ONCOLOGY EVALUATION  Patient Name: [name] MRN: [id number] DOB:[birth year], 46 y.o., female Today's Date: 12/01/2021   PT End of Session - 12/01/21 1346     Visit Number 1    Number of Visits 9    Date for PT Re-Evaluation 12/29/21    PT Start Time 1310   pt arrived late   PT Stop Time 1345    PT Time Calculation (min) 35 min    Activity Tolerance Patient tolerated treatment well    Behavior During Therapy [facility] for tasks assessed/performed             Past Medical History:  Diagnosis Date   Asthma    no current med.   Breast cancer of upper-outer quadrant of left female breast ([name]) 07/16/2014   Heart murmur    as a child   History of seizures    during pregnancies; last seizure 3 yrs. ago   Personal history of radiation therapy 2016   Seizures ([name])    no medications   Stress headaches    Past Surgical History:  Procedure Laterality Date   ABDOMINAL HYSTERECTOMY     BREAST BIOPSY Left 07/14/2014   x2   BREAST BIOPSY Left 08/21/2016   BREAST LUMPECTOMY Left 08/06/2014   BREAST SURGERY     lumpectomy and lymph nodes   CESAREAN SECTION  4098,1191   CESAREAN SECTION WITH BILATERAL TUBAL LIGATION  03/03/2003   CYSTO  12/20/2009   HYSTEROSCOPY W/ ENDOMETRIAL ABLATION  11/11/2007   LYSIS OF ADHESION  12/20/2009   TOTAL ABDOMINAL HYSTERECTOMY W/ BILATERAL SALPINGOOPHORECTOMY  12/20/2009   TYMPANOSTOMY TUBE PLACEMENT     Patient Active Problem List   Diagnosis Date Noted   Dizziness 11/16/2021   Urinary incontinence 11/16/2021   Eye twitch 11/16/2021   History of seizures 11/16/2021   Mild intermittent asthma without complication 47/82/9562   Morbid obesity ([name]) 11/10/2021   Mild persistent asthma with acute exacerbation 11/10/2021   Productive cough 11/10/2021   Vitamin D deficiency 10/31/2021   Prediabetes 10/31/2021   History of breast cancer 10/23/2019   Anxiety 07/05/2015   Breast cancer of upper-outer quadrant of left  female breast ([name]) 07/16/2014   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 12/20/2009   SEIZURE DISORDER 07/07/2009   DEPRESSION, RECURRENT 08/24/2008    PCP: Dawn Rm, NP-C  REFERRING PROVIDER: Gardenia Phlegm, NP  REFERRING DIAG: C50.412,Z17.0 (ICD-10-CM) - Malignant neoplasm of upper-outer quadrant of left breast in female, estrogen receptor positive ([name])   THERAPY DIAG:  Stiffness of left shoulder, not elsewhere classified  Disorder of the skin and subcutaneous tissue related to radiation, unspecified  Abnormal posture  Malignant neoplasm of upper-outer quadrant of left breast in female, estrogen receptor positive ([name])  ONSET DATE: 08/24/2021  Rationale for Evaluation and Treatment Rehabilitation  SUBJECTIVE  SUBJECTIVE STATEMENT:  I have a knot under my arm and they think it might be lymphedema. They have done a bunch of testing and could not find anything else.   PERTINENT HISTORY:  Diagnosed with left invasive breast cancer on 07/13/14 which is ER/PR positive and HER2 negative. Ki67 is 17%. Underwent L br lumpectomy and SLNB on 08/06/14 (0/2)  PAIN:  Are you having pain? Yes NPRS scale: 7/10 Pain location: lateral left breast Pain orientation: Left  PAIN TYPE: dull Pain description: constant  Aggravating factors: wearing a bra - top of bra rubs it Relieving factors: nothing  PRECAUTIONS: Other: at risk of lymphedema  WEIGHT BEARING RESTRICTIONS No  FALLS:  Has patient fallen in last 6 months? 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  LEISURE: walks daily for an hour  HAND DOMINANCE : right   PRIOR LEVEL OF FUNCTION: Independent  PATIENT GOALS for her arm to quit hurting   OBJECTIVE  COGNITION:  Overall cognitive status: Within functional limits for tasks assessed   PALPATION: Increased fibrosis and tightness along lateral border of pec and along serratus  POSTURE: forward head, rounded shoulders  UPPER EXTREMITY AROM/PROM:  A/PROM RIGHT   eval   Shoulder extension 51  Shoulder flexion 166  Shoulder abduction 172  Shoulder internal rotation 48  Shoulder external rotation 85    (Blank rows = not tested)  A/PROM LEFT   eval  Shoulder extension 58  Shoulder flexion 166  Shoulder abduction 154  Shoulder internal rotation 45  Shoulder external rotation 84    (Blank rows = not tested)    UPPER EXTREMITY STRENGTH: grossly 5/5   LYMPHEDEMA ASSESSMENTS:   SURGERY TYPE/DATE: L breast lumpectomy and SLNB on 08/06/14  NUMBER OF LYMPH NODES REMOVED: 0/2  CHEMOTHERAPY: none  RADIATION: completed 11/09/14  HORMONE TREATMENT: stopped due to headaches in 2016  INFECTIONS: none  LYMPHEDEMA ASSESSMENTS:   LANDMARK RIGHT  eval  10 cm proximal to olecranon process 40  Olecranon process 29.5  10 cm proximal to ulnar styloid process 24.2  Just proximal to ulnar styloid process 17.2  Across hand at thumb web space 20  At base of 2nd digit 6.9  (Blank rows = not tested)  LANDMARK LEFT  eval  10 cm proximal to olecranon process 39.7  Olecranon process 30  10 cm proximal to ulnar styloid process 24.4  Just proximal to ulnar styloid process 16.7  Across hand at thumb web space 19.9  At base of 2nd digit 6.5  (Blank rows = not tested)    QUICK DASH SURVEY:   [name] - 12/01/21 0001     Open a tight or new jar Severe difficulty    Do heavy household chores (wash walls, wash floors) Mild difficulty    Carry a shopping bag or briefcase Mild difficulty    Wash your back Unable     Use a knife to cut food No difficulty    Recreational activities in which you take some force or impact through your arm, shoulder, or hand (golf, hammering, tennis) Mild difficulty    During the past week, to what extent has your arm, shoulder or hand problem interfered with your normal social activities with family, friends, neighbors, or groups? Not at all  During the past week, to what extent has your arm, shoulder or hand problem limited your work or other regular daily activities Modererately    Arm, shoulder, or hand pain. Moderate    Tingling (pins and needles) in your arm, shoulder, or hand Moderate    Difficulty Sleeping Mild difficulty    DASH Score 38.64 %               TODAY'S TREATMENT  12/01/21: STM using cocoa butter in supine and R sidelying to L lateral trunk in area of serratus and lateral edge of pec where there is increased tightness/fibrosis  PATIENT EDUCATION:  Education details: radiation fibrosis vs muscle tightness vs scar tissue Person educated: Patient Education method: Explanation Education comprehension: verbalized understanding   HOME EXERCISE PROGRAM: Continue self massage to area of tightness  ASSESSMENT:  CLINICAL IMPRESSION: Patient is a 46 y.o. female who was seen today for physical therapy evaluation and treatment for left lateral trunk and axillary pain. Pt began having pain in her lateral trunk/inferior axillary area approximately three months ago. She had imaging done and there was nothing of note. She does not demonstrate any sign of swelling. There is increased tightness from possibly radiation fibrosis or scar tissue that is palpable in this area. Pt has a job that requires heavy lifting all day that could be contributing to increased tightness. Began soft tissue mobilization to this area and pt demonstrated good improvement with this with decrease tightness noted by end of session. She would benefit from skilled PT services to decrease  discomfort in left lateral trunk/inferior axilla and improve L shoulder abduction to allow her to return to PLOF.    OBJECTIVE IMPAIRMENTS decreased ROM, decreased strength, increased fascial restrictions, impaired UE functional use, postural dysfunction, and pain.   ACTIVITY LIMITATIONS none   PARTICIPATION LIMITATIONS:  none  PERSONAL FACTORS  none  are also affecting patient's functional outcome.   REHAB POTENTIAL: Excellent  CLINICAL DECISION MAKING: Stable/uncomplicated  EVALUATION COMPLEXITY: Low  GOALS: Goals reviewed with patient? Yes  SHORT TERM GOALS = LONG TERM GOALS  LONG TERM GOALS: Target date: 12/29/2021    Pt will report no pain in left lateral trunk or inferior axilla to allow her to work without increased pain. Baseline: 7/10 Goal status: INITIAL  2.  Pt will demonstrate 170 degrees of left shoulder abduction to allow her to reach out to the side.  Baseline: 154 Goal status: INITIAL  3.  Pt will be independent in a home exercise program for continued stretching and strengthening.  Baseline:  Goal status: INITIAL   PLAN: PT FREQUENCY: 2x/week  PT DURATION: 4 weeks  PLANNED INTERVENTIONS: Therapeutic exercises, Patient/Family education, Joint mobilization, Manual lymph drainage, scar mobilization, and Manual therapy  PLAN FOR NEXT SESSION: continue STM and myofascial release along L lateral trunk and inferior axilla in area of tightness, PROM to L shoulder in to abduction   [employer], PT 12/01/2021, 1:57 PM

## 2021-12-01 ENCOUNTER — Encounter: Payer: Self-pay | Admitting: Physical Therapy

## 2021-12-01 ENCOUNTER — Ambulatory Visit: Payer: 59 | Attending: Adult Health | Admitting: Physical Therapy

## 2021-12-01 DIAGNOSIS — C50412 Malignant neoplasm of upper-outer quadrant of left female breast: Secondary | ICD-10-CM | POA: Diagnosis present

## 2021-12-01 DIAGNOSIS — L599 Disorder of the skin and subcutaneous tissue related to radiation, unspecified: Secondary | ICD-10-CM | POA: Insufficient documentation

## 2021-12-01 DIAGNOSIS — R293 Abnormal posture: Secondary | ICD-10-CM | POA: Diagnosis present

## 2021-12-01 DIAGNOSIS — Z170 Estrogen receptor positive status [ER+]: Secondary | ICD-10-CM | POA: Diagnosis present

## 2021-12-01 DIAGNOSIS — M25612 Stiffness of left shoulder, not elsewhere classified: Secondary | ICD-10-CM | POA: Diagnosis present

## 2021-12-05 ENCOUNTER — Telehealth: Payer: Self-pay | Admitting: *Deleted

## 2021-12-05 ENCOUNTER — Ambulatory Visit: Payer: 59 | Admitting: Physical Therapy

## 2021-12-05 NOTE — Telephone Encounter (Signed)
Registrar Magda Paganini informed this nurse of walk-in.  [name] in main lobby with letter from [employer] for [employer] requesting "Healthcare provider lists hours per day, days per week from date through date be filled out for flare ups and reduced work hours".     Currently receiving physical therapy two days per week.  Left arm stiffness and decreased range of motion post lumpectomy and radiotherapy.  Amended form to read able to work 6-8 hours per day, five days per week.  Episodes of flare ups may occur up to ten times per month lasting two hours or one day duration per episode with same dates previously used of 10/06/2021 through 10/07/2022.  Currently no further needs or questions.  Advised to contact if needed.

## 2021-12-08 ENCOUNTER — Ambulatory Visit: Payer: 59

## 2021-12-08 DIAGNOSIS — R293 Abnormal posture: Secondary | ICD-10-CM

## 2021-12-08 DIAGNOSIS — Z170 Estrogen receptor positive status [ER+]: Secondary | ICD-10-CM

## 2021-12-08 DIAGNOSIS — L599 Disorder of the skin and subcutaneous tissue related to radiation, unspecified: Secondary | ICD-10-CM

## 2021-12-08 DIAGNOSIS — M25612 Stiffness of left shoulder, not elsewhere classified: Secondary | ICD-10-CM | POA: Diagnosis not present

## 2021-12-08 NOTE — Therapy (Signed)
OUTPATIENT PHYSICAL THERAPY ONCOLOGY TREATMENT  Patient Name: [name] MRN: [id number] DOB:10/10/1975, 46 y.o., female Today's Date: 12/08/2021   PT End of Session - 12/08/21 1600     Visit Number 2    Number of Visits 9    Date for PT Re-Evaluation 12/29/21    PT Start Time 1601    PT Stop Time 1651    PT Time Calculation (min) 50 min    Activity Tolerance Patient tolerated treatment well    Behavior During Therapy [facility] for tasks assessed/performed             Past Medical History:  Diagnosis Date   Asthma    no current med.   Breast cancer of upper-outer quadrant of left female breast ([place]) 07/16/2014   Heart murmur    as a child   History of seizures    during pregnancies; last seizure 3 yrs. ago   Personal history of radiation therapy 2016   Seizures ([place])    no medications   Stress headaches    Past Surgical History:  Procedure Laterality Date   ABDOMINAL HYSTERECTOMY     BREAST BIOPSY Left 07/14/2014   x2   BREAST BIOPSY Left 08/21/2016   BREAST LUMPECTOMY Left 08/06/2014   BREAST SURGERY     lumpectomy and lymph nodes   CESAREAN SECTION  4949,4473   CESAREAN SECTION WITH BILATERAL TUBAL LIGATION  03/03/2003   CYSTO  12/20/2009   HYSTEROSCOPY W/ ENDOMETRIAL ABLATION  11/11/2007   LYSIS OF ADHESION  12/20/2009   TOTAL ABDOMINAL HYSTERECTOMY W/ BILATERAL SALPINGOOPHORECTOMY  12/20/2009   TYMPANOSTOMY TUBE PLACEMENT     Patient Active Problem List   Diagnosis Date Noted   Dizziness 11/16/2021   Urinary incontinence 11/16/2021   Eye twitch 11/16/2021   History of seizures 11/16/2021   Mild intermittent asthma without complication 95/84/4171   Morbid obesity ([place]) 11/10/2021   Mild persistent asthma with acute exacerbation 11/10/2021   Productive cough 11/10/2021   Vitamin D deficiency 10/31/2021   Prediabetes 10/31/2021   History of breast cancer 10/23/2019   Anxiety 07/05/2015   Breast cancer of upper-outer quadrant of left female breast ([place])  07/16/2014   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 12/20/2009   SEIZURE DISORDER 07/07/2009   DEPRESSION, RECURRENT 08/24/2008    PCP: [name], NP-C  REFERRING PROVIDER: Gardenia Phlegm, NP  REFERRING DIAG: C50.412,Z17.0 (ICD-10-CM) - Malignant neoplasm of upper-outer quadrant of left breast in female, estrogen receptor positive ([place])   THERAPY DIAG:  Stiffness of left shoulder, not elsewhere classified  Disorder of the skin and subcutaneous tissue related to radiation, unspecified  Abnormal posture  Malignant neoplasm of upper-outer quadrant of left breast in female, estrogen receptor positive ([place])  ONSET DATE: 08/24/2021  Rationale for Evaluation and Treatment Rehabilitation  SUBJECTIVE  SUBJECTIVE STATEMENT:   Felt a lot better after the soft tissue work last time  PERTINENT HISTORY:  Diagnosed with left invasive breast cancer on 07/13/14 which is ER/PR positive and HER2 negative. Ki67 is 17%. Underwent L br lumpectomy and SLNB on 08/06/14 (0/2)  PAIN:  Are you having pain? Yes NPRS scale: 6/10 Pain location: lateral left breast Pain orientation: Left  PAIN TYPE: dull Pain description: constant  Aggravating factors: wearing a bra - top of bra rubs it Relieving factors: nothing  PRECAUTIONS: Other: at risk of lymphedema  WEIGHT BEARING RESTRICTIONS No  FALLS:  Has patient fallen in last 6 months? 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  LEISURE: walks daily for an hour  HAND DOMINANCE : right   PRIOR LEVEL OF  FUNCTION: Independent  PATIENT GOALS for her arm to quit hurting   OBJECTIVE  COGNITION:  Overall cognitive status: Within functional limits for tasks assessed   PALPATION: Increased fibrosis and tightness along lateral border of pec and along serratus  POSTURE: forward head, rounded shoulders  UPPER EXTREMITY AROM/PROM:  A/PROM RIGHT   eval   Shoulder extension 51  Shoulder flexion 166  Shoulder abduction 172  Shoulder internal rotation 48  Shoulder external rotation 85    (Blank rows = not tested)  A/PROM LEFT   eval  Shoulder extension 58  Shoulder flexion 166  Shoulder abduction 154  Shoulder internal rotation 45  Shoulder external rotation 84    (Blank rows = not tested)    UPPER EXTREMITY STRENGTH: grossly 5/5   LYMPHEDEMA ASSESSMENTS:   SURGERY TYPE/DATE: L breast lumpectomy and SLNB on 08/06/14  NUMBER OF LYMPH NODES REMOVED: 0/2  CHEMOTHERAPY: none  RADIATION: completed 11/09/14  HORMONE TREATMENT: stopped due to headaches in 2016  INFECTIONS: none  LYMPHEDEMA ASSESSMENTS:   LANDMARK RIGHT  eval  10 cm proximal to olecranon process 40  Olecranon process 29.5  10 cm proximal to ulnar styloid process 24.2  Just proximal to ulnar styloid process 17.2  Across hand at thumb web space 20  At base of 2nd digit 6.9  (Blank rows = not tested)  LANDMARK LEFT  eval  10 cm proximal to olecranon process 39.7  Olecranon process 30  10 cm proximal to ulnar styloid process 24.4  Just proximal to ulnar styloid process 16.7  Across hand at thumb web space 19.9  At base of 2nd digit 6.5  (Blank rows = not tested)    QUICK DASH SURVEY:       TODAY'S TREATMENT  12/08/2021 Foam pad in TG soft made to place under axillary border of bra for comfort. STM using cocoa butter in supine and R sidelying to L lateral trunk in area of serratus,lats and lateral edge of pec, UT where there is increased tightness/fibrosis. Gentle PROM for left shoulder  abduction. VC's to get pt to relax LTR with arms outstretched and in goal post position x 3 ea Supine wand flexion and scaption x 3,wall slide x 5 for abduction, single arm chest stretch at doorway x 3 all x 5-10 sec . 12/01/21: STM using cocoa butter in supine and R sidelying to L lateral trunk in area of serratus and lateral edge of pec where there is increased tightness/fibrosis  PATIENT EDUCATION:  Education details: Access Code: [email or web address] URL: [email or web address] Date: 12/08/2021 Prepared by: Cheral Almas  Exercises - Supine Lower Trunk Rotation  - 2 x daily - 7 x weekly - 1 sets - 5 reps - 10 hold - Supine Shoulder Flexion with Dowel  - 2 x daily - 7 x weekly - 1 sets - 5 reps - 10 hold - Standing Shoulder Abduction Slides at Wall  - 2 x daily - 7 x weekly - 1 sets - 5 reps - 10 hold - Single Arm Doorway Pec Stretch at 90 Degrees Abduction  - 2 x daily - 7 x weekly - 1 sets - 3 reps - 10-20 hold Person educated: Patient Education method: Explanation Education comprehension: verbalized understanding   HOME EXERCISE PROGRAM: Continue self massage to area of tightness, LTR with arms outstretched and goal post, supine flexion and scaption with dowel, abduction wall slides, single arm pec stretch in doorway  ASSESSMENT: Continued soft tissue mobilization to left lateral trunk, pectorals and added scapular area as well. Tightness/tenderness noted in pecs, lats and serratus. Pt had difficulty relaxing for PROM and required VC's. Stretches targeting tight areas added to HEP and pictures given. Foam pad made to place at axillary border of bra sore area   OBJECTIVE IMPAIRMENTS decreased ROM, decreased strength, increased fascial restrictions, impaired UE functional use, postural dysfunction, and pain.   ACTIVITY LIMITATIONS none   PARTICIPATION LIMITATIONS:  none  PERSONAL FACTORS  none  are also affecting patient's functional outcome.   REHAB POTENTIAL:  Excellent  CLINICAL DECISION MAKING: Stable/uncomplicated  EVALUATION COMPLEXITY: Low  GOALS: Goals reviewed with patient? Yes  SHORT TERM GOALS = LONG TERM GOALS  LONG TERM GOALS: Target date: 01/05/2022    Pt will report no pain in left lateral trunk or inferior axilla to allow her to work without increased pain. Baseline: 7/10 Goal status: INITIAL  2.  Pt will demonstrate 170 degrees of left shoulder abduction to allow her to reach out to the side.  Baseline: 154 Goal status: INITIAL  3.  Pt will be independent in a home exercise program for continued stretching and strengthening.  Baseline:  Goal status: INITIAL   PLAN: PT FREQUENCY: 2x/week  PT DURATION: 4 weeks  PLANNED INTERVENTIONS: Therapeutic exercises, Patient/Family education, Joint mobilization, Manual lymph drainage, scar mobilization, and Manual therapy  PLAN FOR NEXT SESSION: continue STM and myofascial release along L lateral trunk and inferior axilla in area of tightness, PROM to L shoulder in to abduction, review stretches given   Claris Pong, PT 12/08/2021, 4:59 PM

## 2021-12-12 ENCOUNTER — Ambulatory Visit: Payer: 59

## 2021-12-12 DIAGNOSIS — Z170 Estrogen receptor positive status [ER+]: Secondary | ICD-10-CM

## 2021-12-12 DIAGNOSIS — M25612 Stiffness of left shoulder, not elsewhere classified: Secondary | ICD-10-CM

## 2021-12-12 DIAGNOSIS — L599 Disorder of the skin and subcutaneous tissue related to radiation, unspecified: Secondary | ICD-10-CM

## 2021-12-12 DIAGNOSIS — R293 Abnormal posture: Secondary | ICD-10-CM

## 2021-12-12 NOTE — Therapy (Signed)
OUTPATIENT PHYSICAL THERAPY ONCOLOGY TREATMENT  Patient Name: [name] MRN: [id number] DOB:October 11, 1975, 46 y.o., female Today's Date: 12/12/2021   PT End of Session - 12/12/21 1501     Visit Number 3    Number of Visits 9    Date for PT Re-Evaluation 12/29/21    Authorization Type cigna    Authorization - Visit Number 3    Authorization - Number of Visits 9    PT Start Time 4718    PT Stop Time 1550    PT Time Calculation (min) 46 min    Activity Tolerance Patient tolerated treatment well    Behavior During Therapy WFL for tasks assessed/performed             Past Medical History:  Diagnosis Date   Asthma    no current med.   Breast cancer of upper-outer quadrant of left female breast ([place]) 07/16/2014   Heart murmur    as a child   History of seizures    during pregnancies; last seizure 3 yrs. ago   Personal history of radiation therapy 2016   Seizures ([place])    no medications   Stress headaches    Past Surgical History:  Procedure Laterality Date   ABDOMINAL HYSTERECTOMY     BREAST BIOPSY Left 07/14/2014   x2   BREAST BIOPSY Left 08/21/2016   BREAST LUMPECTOMY Left 08/06/2014   BREAST SURGERY     lumpectomy and lymph nodes   CESAREAN SECTION  5501,5868   CESAREAN SECTION WITH BILATERAL TUBAL LIGATION  03/03/2003   CYSTO  12/20/2009   HYSTEROSCOPY W/ ENDOMETRIAL ABLATION  11/11/2007   LYSIS OF ADHESION  12/20/2009   TOTAL ABDOMINAL HYSTERECTOMY W/ BILATERAL SALPINGOOPHORECTOMY  12/20/2009   TYMPANOSTOMY TUBE PLACEMENT     Patient Active Problem List   Diagnosis Date Noted   Dizziness 11/16/2021   Urinary incontinence 11/16/2021   Eye twitch 11/16/2021   History of seizures 11/16/2021   Mild intermittent asthma without complication 25/74/9355   Morbid obesity ([place]) 11/10/2021   Mild persistent asthma with acute exacerbation 11/10/2021   Productive cough 11/10/2021   Vitamin D deficiency 10/31/2021   Prediabetes 10/31/2021   History of breast cancer  10/23/2019   Anxiety 07/05/2015   Breast cancer of upper-outer quadrant of left female breast ([place]) 07/16/2014   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 12/20/2009   SEIZURE DISORDER 07/07/2009   DEPRESSION, RECURRENT 08/24/2008    PCP: [name], NP-C  REFERRING PROVIDER: Gardenia Phlegm, NP  REFERRING DIAG: C50.412,Z17.0 (ICD-10-CM) - Malignant neoplasm of upper-outer quadrant of left breast in female, estrogen receptor positive ([place])   THERAPY DIAG:  Stiffness of left shoulder, not elsewhere classified  Disorder of the skin and subcutaneous tissue related to radiation, unspecified  Abnormal posture  Malignant neoplasm of upper-outer quadrant of left breast in female, estrogen receptor positive ([place])  ONSET DATE: 08/24/2021  Rationale for Evaluation and Treatment Rehabilitation  SUBJECTIVE  SUBJECTIVE STATEMENT:   Really sore after last visit for 2 days but I eased up on the stretches and it did better. ROM seems to be a little better. The foam pad at the axillary region helped but I lost it.  PERTINENT HISTORY:  Diagnosed with left invasive breast cancer on 07/13/14 which is ER/PR positive and HER2 negative. Ki67 is 17%. Underwent L br lumpectomy and SLNB on 08/06/14 (0/2)  PAIN:  Are you having pain? Yes NPRS scale: 4/10 Pain location: lateral left breast Pain orientation: Left  PAIN TYPE: dull Pain description: constant  Aggravating factors: wearing a bra - top of bra rubs it Relieving factors: nothing  PRECAUTIONS: Other: at risk of lymphedema  WEIGHT BEARING RESTRICTIONS No  FALLS:  Has patient fallen in last 6 months? 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  LEISURE: walks daily for an hour  HAND DOMINANCE : right   PRIOR LEVEL OF FUNCTION: Independent  PATIENT GOALS for her arm to quit hurting   OBJECTIVE  COGNITION:  Overall cognitive status: Within functional limits for tasks assessed   PALPATION: Increased fibrosis and tightness along lateral border of pec and along serratus  POSTURE: forward head, rounded shoulders  UPPER EXTREMITY AROM/PROM:  A/PROM RIGHT   eval   Shoulder extension 51  Shoulder flexion 166  Shoulder abduction 172  Shoulder internal rotation 48  Shoulder external rotation 85    (Blank rows = not tested)  A/PROM LEFT   eval  Shoulder extension 58  Shoulder flexion 166  Shoulder abduction 154  Shoulder internal rotation 45  Shoulder external rotation 84    (Blank rows = not tested)    UPPER EXTREMITY STRENGTH: grossly 5/5   LYMPHEDEMA ASSESSMENTS:   SURGERY TYPE/DATE: L breast lumpectomy and SLNB on 08/06/14  NUMBER OF LYMPH NODES REMOVED: 0/2  CHEMOTHERAPY: none  RADIATION: completed 11/09/14  HORMONE TREATMENT: stopped due to headaches in 2016  INFECTIONS: none  LYMPHEDEMA ASSESSMENTS:   LANDMARK RIGHT  eval  10 cm proximal to olecranon process 40  Olecranon process 29.5  10 cm proximal to ulnar styloid process 24.2  Just proximal to ulnar styloid process 17.2  Across hand at thumb web space 20  At base of 2nd digit 6.9  (Blank rows = not tested)  LANDMARK LEFT  eval  10 cm proximal to olecranon process 39.7  Olecranon process 30  10 cm proximal to ulnar styloid process 24.4  Just proximal to ulnar styloid process 16.7  Across hand at thumb web space 19.9  At base of 2nd digit 6.5  (Blank rows = not tested)    QUICK DASH SURVEY:       TODAY'S TREATMENT  12/12/2021 Pt was sunburned at UT and back  Pulleys for flexion,  scaption abduction x 1:30 sec STM using cocoa butter in supine and R sidelying to L lateral trunk in area of serratus,lats and lateral edge of pecs avoiding areas of sunburn at UT/scapular area Gentle PROM for left shoulder flexion and abduction,. VC's to get pt to relax LTR with arms extended and goal post position x 3 ea Supine scapular series Flexion, Horizontal abduction, ER x 5-7 reps ea with Vc's for proper form New foam pad made for axillary border of bra 12/08/2021  Foam pad in TG soft made to place under axillary border of bra for comfort. STM using cocoa butter in supine and R sidelying to L lateral trunk in area of serratus,lats and lateral edge of pec, UT where there is increased tightness/fibrosis. Gentle PROM for left shoulder abduction. VC's to get pt to relax LTR with arms outstretched and in goal post position x 3 ea Supine wand flexion and scaption x 3,wall slide x 5 for abduction, single arm chest stretch at doorway x 3 all x 5-10 sec  . 12/01/21: STM using cocoa butter in supine and R sidelying to L lateral trunk in area of serratus and lateral edge of pec where there is increased tightness/fibrosis  PATIENT EDUCATION:  Education details: Access Code: [email or web address] URL: [email or web address] Date: 12/08/2021 Prepared by: Cheral Almas  Exercises - Supine Lower Trunk Rotation  - 2 x daily - 7 x weekly - 1 sets - 5 reps - 10 hold - Supine Shoulder Flexion with Dowel  - 2 x daily - 7 x weekly - 1 sets - 5 reps - 10 hold - Standing Shoulder Abduction Slides at Wall  - 2 x daily - 7 x weekly - 1 sets - 5 reps - 10 hold - Single Arm Doorway Pec Stretch at 90 Degrees Abduction  - 2 x daily - 7 x weekly - 1 sets - 3 reps - 10-20 hold Person educated: Patient Education method: Explanation Education comprehension: verbalized understanding   HOME EXERCISE PROGRAM: Continue self massage to area of tightness, LTR with arms outstretched and goal post, supine flexion and  scaption with dowel, abduction wall slides, single arm pec stretch in doorway  ASSESSMENT: Continued soft tissue mobilization to left lateral trunk, pectorals, serratus but avoided areas of sunburn. Tightness/tenderness noted in pecs, lats and serratus. Pt continues to have difficulty relaxing for PROM and required VC's. Pt was advised not to overstetch which will make her sore.  Another Foam pad made to place at axillary border of bra sore area secondary to other foam fell out and was lost. Pt requires review of supine scapular series before adding to HEP   OBJECTIVE IMPAIRMENTS decreased ROM, decreased strength, increased fascial restrictions, impaired UE functional use, postural dysfunction, and pain.   ACTIVITY LIMITATIONS none   PARTICIPATION LIMITATIONS:  none  PERSONAL FACTORS  none  are also affecting patient's functional outcome.   REHAB POTENTIAL: Excellent  CLINICAL DECISION MAKING: Stable/uncomplicated  EVALUATION COMPLEXITY: Low  GOALS: Goals reviewed with patient? Yes  SHORT TERM GOALS = LONG TERM GOALS  LONG TERM GOALS: Target date: 01/09/2022    Pt will report no pain in left lateral trunk or inferior axilla to allow her to work without increased pain. Baseline: 7/10 Goal status: INITIAL  2.  Pt will demonstrate 170 degrees of left shoulder abduction to allow her to reach out to the side.  Baseline: 154 Goal status: INITIAL  3.  Pt will be independent in a home exercise program for continued stretching and strengthening.  Baseline:  Goal status: INITIAL   PLAN: PT FREQUENCY: 2x/week  PT DURATION: 4 weeks  PLANNED INTERVENTIONS: Therapeutic exercises, Patient/Family education, Joint mobilization, Manual lymph drainage, scar mobilization, and Manual therapy  PLAN FOR NEXT SESSION: Review supine scap series and give for HEP if ready,continue STM and myofascial release along L lateral trunk and inferior axilla in area of tightness, PROM to L shoulder in to  abduction, review stretches given   Claris Pong, PT 12/12/2021, 3:57 PM

## 2021-12-15 ENCOUNTER — Ambulatory Visit: Payer: 59

## 2021-12-15 DIAGNOSIS — Z170 Estrogen receptor positive status [ER+]: Secondary | ICD-10-CM

## 2021-12-15 DIAGNOSIS — M25612 Stiffness of left shoulder, not elsewhere classified: Secondary | ICD-10-CM | POA: Diagnosis not present

## 2021-12-15 DIAGNOSIS — R293 Abnormal posture: Secondary | ICD-10-CM

## 2021-12-15 DIAGNOSIS — L599 Disorder of the skin and subcutaneous tissue related to radiation, unspecified: Secondary | ICD-10-CM

## 2021-12-15 NOTE — Therapy (Signed)
OUTPATIENT PHYSICAL THERAPY ONCOLOGY TREATMENT  Patient Name: [name] MRN: [id number] DOB:11/24/1975, 46 y.o., female Today's Date: 12/15/2021   PT End of Session - 12/15/21 1323     Visit Number 4    Number of Visits 9    Date for PT Re-Evaluation 12/29/21    Authorization Type cigna    Authorization - Visit Number 4    Authorization - Number of Visits 9    PT Start Time 3254   pt. late   PT Stop Time 1358    PT Time Calculation (min) 41 min    Activity Tolerance Patient tolerated treatment well    Behavior During Therapy WFL for tasks assessed/performed             Past Medical History:  Diagnosis Date   Asthma    no current med.   Breast cancer of upper-outer quadrant of left female breast ([place]) 07/16/2014   Heart murmur    as a child   History of seizures    during pregnancies; last seizure 3 yrs. ago   Personal history of radiation therapy 2016   Seizures ([place])    no medications   Stress headaches    Past Surgical History:  Procedure Laterality Date   ABDOMINAL HYSTERECTOMY     BREAST BIOPSY Left 07/14/2014   x2   BREAST BIOPSY Left 08/21/2016   BREAST LUMPECTOMY Left 08/06/2014   BREAST SURGERY     lumpectomy and lymph nodes   CESAREAN SECTION  9826,4158   CESAREAN SECTION WITH BILATERAL TUBAL LIGATION  03/03/2003   CYSTO  12/20/2009   HYSTEROSCOPY W/ ENDOMETRIAL ABLATION  11/11/2007   LYSIS OF ADHESION  12/20/2009   TOTAL ABDOMINAL HYSTERECTOMY W/ BILATERAL SALPINGOOPHORECTOMY  12/20/2009   TYMPANOSTOMY TUBE PLACEMENT     Patient Active Problem List   Diagnosis Date Noted   Dizziness 11/16/2021   Urinary incontinence 11/16/2021   Eye twitch 11/16/2021   History of seizures 11/16/2021   Mild intermittent asthma without complication 30/94/0768   Morbid obesity ([place]) 11/10/2021   Mild persistent asthma with acute exacerbation 11/10/2021   Productive cough 11/10/2021   Vitamin D deficiency 10/31/2021   Prediabetes 10/31/2021   History of  breast cancer 10/23/2019   Anxiety 07/05/2015   Breast cancer of upper-outer quadrant of left female breast ([place]) 07/16/2014   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 12/20/2009   SEIZURE DISORDER 07/07/2009   DEPRESSION, RECURRENT 08/24/2008    PCP: [name], NP-C  REFERRING PROVIDER: Gardenia Phlegm, NP  REFERRING DIAG: C50.412,Z17.0 (ICD-10-CM) - Malignant neoplasm of upper-outer quadrant of left breast in female, estrogen receptor positive ([place])   THERAPY DIAG:  Stiffness of left shoulder, not elsewhere classified  Disorder of the skin and subcutaneous tissue related to radiation, unspecified  Abnormal posture  Malignant neoplasm of upper-outer quadrant of left breast in female, estrogen receptor positive ([place])  ONSET DATE: 08/24/2021  Rationale for Evaluation and Treatment Rehabilitation  SUBJECTIVE  SUBJECTIVE STATEMENT:   I was already sore from working because I had a lot of heavy totes today.  Sunburn is better. I am not as tight under my arm  PERTINENT HISTORY:  Diagnosed with left invasive breast cancer on 07/13/14 which is ER/PR positive and HER2 negative. Ki67 is 17%. Underwent L br lumpectomy and SLNB on 08/06/14 (0/2)  PAIN:  Are you having pain? Yes NPRS scale: 8/10 Pain location: lateral left breast Pain orientation: Left  PAIN TYPE: dull Pain description: constant  Aggravating factors: wearing a bra - top of bra rubs it Relieving factors: nothing  PRECAUTIONS: Other: at risk of lymphedema  WEIGHT BEARING RESTRICTIONS No  FALLS:  Has patient fallen in last 6 months? 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  LEISURE: walks daily for an hour  HAND DOMINANCE : right   PRIOR LEVEL OF FUNCTION: Independent  PATIENT GOALS for her arm to quit hurting   OBJECTIVE  COGNITION:  Overall cognitive status: Within functional limits for tasks assessed   PALPATION: Increased fibrosis and tightness along lateral border of pec and along serratus  POSTURE: forward head, rounded shoulders  UPPER EXTREMITY AROM/PROM:  A/PROM RIGHT   eval   Shoulder extension 51  Shoulder flexion 166  Shoulder abduction 172  Shoulder internal rotation 48  Shoulder external rotation 85    (Blank rows = not tested)  A/PROM LEFT   eval  Shoulder extension 58  Shoulder flexion 166  Shoulder abduction 154  Shoulder internal rotation 45  Shoulder external rotation 84    (Blank rows = not tested)    UPPER EXTREMITY STRENGTH: grossly 5/5   LYMPHEDEMA ASSESSMENTS:   SURGERY TYPE/DATE: L breast lumpectomy and SLNB on 08/06/14  NUMBER OF LYMPH NODES REMOVED: 0/2  CHEMOTHERAPY: none  RADIATION: completed 11/09/14  HORMONE TREATMENT: stopped due to headaches in 2016  INFECTIONS: none  LYMPHEDEMA ASSESSMENTS:   LANDMARK RIGHT  eval  10 cm proximal to olecranon process 40  Olecranon process 29.5  10 cm proximal to ulnar styloid process 24.2  Just proximal to ulnar styloid process 17.2  Across hand at thumb web space 20  At base of 2nd digit 6.9  (Blank rows = not tested)  LANDMARK LEFT  eval  10 cm proximal to olecranon process 39.7  Olecranon process 30  10 cm proximal to ulnar styloid process 24.4  Just proximal to ulnar styloid process 16.7  Across hand at thumb web space 19.9  At base of 2nd digit 6.5  (Blank rows = not tested)    QUICK DASH SURVEY:       TODAY'S TREATMENT  12/15/2021  STM using cocoa butter in supine and R sidelying to L lateral trunk in area of  serratus,lats and lateral edge of pec, UT where there is increased tightness/fibrosis. Gentle PROM for left shoulder flexion, scaption abduction, ER. VC's to get pt to relax LTR with arms outstretched  x 5 ea Supine wand flexion and scaption x 5   12/12/2021 Pt was sunburned at UT and back  Pulleys for flexion, scaption abduction x 1:30 sec STM using cocoa butter in supine and R sidelying to L lateral trunk in area of serratus,lats and lateral edge of pecs avoiding areas of sunburn at UT/scapular area  Gentle PROM for left shoulder flexion and abduction,. VC's to get pt to relax LTR with arms extended and goal post position x 3 ea Supine scapular series Flexion, Horizontal abduction, ER x 5-7 reps ea with Vc's for proper form New foam pad made for axillary border of bra 12/08/2021 Foam pad in TG soft made to place under axillary border of bra for comfort. STM using cocoa butter in supine and R sidelying to L lateral trunk in area of serratus,lats and lateral edge of pec, UT where there is increased tightness/fibrosis. Gentle PROM for left shoulder abduction. VC's to get pt to relax LTR with arms outstretched and in goal post position x 3 ea Supine wand flexion and scaption x 3,wall slide x 5 for abduction, single arm chest stretch at doorway x 3 all x 5-10 sec  . 12/01/21: STM using cocoa butter in supine and R sidelying to L lateral trunk in area of serratus and lateral edge of pec where there is increased tightness/fibrosis  PATIENT EDUCATION:  Education details: Access Code: [email or web address] URL: [email or web address] Date: 12/08/2021 Prepared by: Cheral Almas  Exercises - Supine Lower Trunk Rotation  - 2 x daily - 7 x weekly - 1 sets - 5 reps - 10 hold - Supine Shoulder Flexion with Dowel  - 2 x daily - 7 x weekly - 1 sets - 5 reps - 10 hold - Standing Shoulder Abduction Slides at Wall  - 2 x daily - 7 x weekly - 1 sets - 5 reps - 10 hold - Single Arm Doorway Pec Stretch at 90  Degrees Abduction  - 2 x daily - 7 x weekly - 1 sets - 3 reps - 10-20 hold Person educated: Patient Education method: Explanation Education comprehension: verbalized understanding   HOME EXERCISE PROGRAM: Continue self massage to area of tightness, LTR with arms outstretched and goal post, supine flexion and scaption with dowel, abduction wall slides, single arm pec stretch in doorway  ASSESSMENT: Continued soft tissue mobilization to left lateral trunk, pectorals, serratus with continued tightness/tenderness noted in pecs, lats and serratus. Pt continues to have difficulty relaxing for PROM and required VC's.Pt already in a lot of pain from work activities and did not review supine scapular stabs today.   OBJECTIVE IMPAIRMENTS decreased ROM, decreased strength, increased fascial restrictions, impaired UE functional use, postural dysfunction, and pain.   ACTIVITY LIMITATIONS none   PARTICIPATION LIMITATIONS:  none  PERSONAL FACTORS  none  are also affecting patient's functional outcome.   REHAB POTENTIAL: Excellent  CLINICAL DECISION MAKING: Stable/uncomplicated  EVALUATION COMPLEXITY: Low  GOALS: Goals reviewed with patient? Yes  SHORT TERM GOALS = LONG TERM GOALS  LONG TERM GOALS: Target date: 01/12/2022    Pt will report no pain in left lateral trunk or inferior axilla to allow her to work without increased pain. Baseline: 7/10 Goal status: INITIAL  2.  Pt will demonstrate 170 degrees of left shoulder abduction to allow her to reach out to the side.  Baseline: 154 Goal status: INITIAL  3.  Pt will be independent in a home exercise program for continued stretching and strengthening.  Baseline:  Goal status: INITIAL   PLAN: PT FREQUENCY: 2x/week  PT DURATION: 4 weeks  PLANNED INTERVENTIONS: Therapeutic exercises, Patient/Family education, Joint mobilization, Manual lymph drainage, scar mobilization, and Manual therapy  PLAN FOR NEXT SESSION: Review supine scap  series and give for HEP if ready,continue STM and myofascial release along L lateral trunk and inferior axilla in area of tightness,  PROM to L shoulder in to abduction, review stretches given   Claris Pong, PT 12/15/2021, 1:59 PM

## 2021-12-19 ENCOUNTER — Ambulatory Visit: Payer: 59 | Admitting: Physical Therapy

## 2021-12-19 ENCOUNTER — Encounter: Payer: Self-pay | Admitting: Physical Therapy

## 2021-12-19 DIAGNOSIS — M25612 Stiffness of left shoulder, not elsewhere classified: Secondary | ICD-10-CM

## 2021-12-19 DIAGNOSIS — R293 Abnormal posture: Secondary | ICD-10-CM

## 2021-12-19 DIAGNOSIS — L599 Disorder of the skin and subcutaneous tissue related to radiation, unspecified: Secondary | ICD-10-CM

## 2021-12-19 DIAGNOSIS — C50412 Malignant neoplasm of upper-outer quadrant of left female breast: Secondary | ICD-10-CM

## 2021-12-22 ENCOUNTER — Ambulatory Visit: Payer: 59 | Admitting: Physical Therapy

## 2021-12-22 ENCOUNTER — Encounter: Payer: Self-pay | Admitting: Physical Therapy

## 2021-12-22 DIAGNOSIS — L599 Disorder of the skin and subcutaneous tissue related to radiation, unspecified: Secondary | ICD-10-CM

## 2021-12-22 DIAGNOSIS — M25612 Stiffness of left shoulder, not elsewhere classified: Secondary | ICD-10-CM | POA: Diagnosis not present

## 2021-12-22 DIAGNOSIS — R293 Abnormal posture: Secondary | ICD-10-CM

## 2021-12-22 DIAGNOSIS — C50412 Malignant neoplasm of upper-outer quadrant of left female breast: Secondary | ICD-10-CM
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
## 2021-12-22 NOTE — Patient Instructions (Signed)
Over Head Pull: Narrow and Wide Grip   Cancer Rehab [phone] ° ° °On back, knees bent, feet flat, band across thighs, elbows straight but relaxed. Pull hands apart (start). Keeping elbows straight, bring arms up and over head, hands toward floor. Keep pull steady on band. Hold momentarily. Return slowly, keeping pull steady, back to start. Then do same with a wider grip on the band (past shoulder width) °Repeat _10__ times. Band color __yellow____  ° °Side Pull: Double Arm ° ° °On back, knees bent, feet flat. Arms perpendicular to body, shoulder level, elbows straight but relaxed. Pull arms out to sides, elbows straight. Resistance band comes across collarbones, hands toward floor. Hold momentarily. Slowly return to starting position. Repeat _10__ times. Band color _yellow____  ° °Sword ° ° °On back, knees bent, feet flat, left hand on left hip, right hand above left. Pull right arm DIAGONALLY (hip to shoulder) across chest. Bring right arm along head toward floor. Thumb is pointed down when by hip and rotates upwards when by head. Hold momentarily. Slowly return to starting position. °Repeat _10__ times. Do with left arm. Band color _yellow_____  ° °Shoulder Rotation: Double Arm ° ° °On back, knees bent, feet flat, elbows tucked at sides, bent 90°, hands palms up. Pull hands apart and down toward floor, keeping elbows near sides. Hold momentarily. Slowly return to starting position. °Repeat __10__ times. Band color __yellow____  ° ° °

## 2021-12-26 ENCOUNTER — Encounter: Payer: Self-pay | Admitting: Physical Therapy

## 2021-12-26 ENCOUNTER — Ambulatory Visit: Payer: 59 | Attending: Adult Health | Admitting: Physical Therapy

## 2021-12-26 DIAGNOSIS — L599 Disorder of the skin and subcutaneous tissue related to radiation, unspecified: Secondary | ICD-10-CM | POA: Diagnosis present

## 2021-12-26 DIAGNOSIS — Z170 Estrogen receptor positive status [ER+]: Secondary | ICD-10-CM | POA: Insufficient documentation

## 2021-12-26 DIAGNOSIS — C50412 Malignant neoplasm of upper-outer quadrant of left female breast: Secondary | ICD-10-CM | POA: Diagnosis present

## 2021-12-26 DIAGNOSIS — M25612 Stiffness of left shoulder, not elsewhere classified: Secondary | ICD-10-CM | POA: Diagnosis not present

## 2021-12-26 DIAGNOSIS — R293 Abnormal posture: Secondary | ICD-10-CM | POA: Diagnosis present

## 2021-12-26 NOTE — Therapy (Signed)
OUTPATIENT PHYSICAL THERAPY ONCOLOGY TREATMENT  Patient Name: [name] MRN: [id number] DOB:14-Jul-1975, 46 y.o., female Today's Date: 12/26/2021   PT End of Session - 12/26/21 1105     Visit Number 7    Number of Visits 9    Date for PT Re-Evaluation 12/29/21    Authorization Type cigna    Authorization - Visit Number 6    Authorization - Number of Visits 9    PT Start Time 1104    PT Stop Time 1148    PT Time Calculation (min) 44 min    Activity Tolerance Patient tolerated treatment well    Behavior During Therapy WFL for tasks assessed/performed             Past Medical History:  Diagnosis Date   Asthma    no current med.   Breast cancer of upper-outer quadrant of left female breast ([place]) 07/16/2014   Heart murmur    as a child   History of seizures    during pregnancies; last seizure 3 yrs. ago   Personal history of radiation therapy 2016   Seizures ([place])    no medications   Stress headaches    Past Surgical History:  Procedure Laterality Date   ABDOMINAL HYSTERECTOMY     BREAST BIOPSY Left 07/14/2014   x2   BREAST BIOPSY Left 08/21/2016   BREAST LUMPECTOMY Left 08/06/2014   BREAST SURGERY     lumpectomy and lymph nodes   CESAREAN SECTION  8472,0721   CESAREAN SECTION WITH BILATERAL TUBAL LIGATION  03/03/2003   CYSTO  12/20/2009   HYSTEROSCOPY W/ ENDOMETRIAL ABLATION  11/11/2007   LYSIS OF ADHESION  12/20/2009   TOTAL ABDOMINAL HYSTERECTOMY W/ BILATERAL SALPINGOOPHORECTOMY  12/20/2009   TYMPANOSTOMY TUBE PLACEMENT     Patient Active Problem List   Diagnosis Date Noted   Dizziness 11/16/2021   Urinary incontinence 11/16/2021   Eye twitch 11/16/2021   History of seizures 11/16/2021   Mild intermittent asthma without complication 82/88/3374   Morbid obesity ([place]) 11/10/2021   Mild persistent asthma with acute exacerbation 11/10/2021   Productive cough 11/10/2021   Vitamin D deficiency 10/31/2021   Prediabetes 10/31/2021   History of breast cancer  10/23/2019   Anxiety 07/05/2015   Breast cancer of upper-outer quadrant of left female breast ([place]) 07/16/2014   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 12/20/2009   SEIZURE DISORDER 07/07/2009   DEPRESSION, RECURRENT 08/24/2008    PCP: [name], NP-C  REFERRING PROVIDER: Gardenia Phlegm, NP  REFERRING DIAG: C50.412,Z17.0 (ICD-10-CM) - Malignant neoplasm of upper-outer quadrant of left breast in female, estrogen receptor positive ([place])   THERAPY DIAG:  Stiffness of left shoulder, not elsewhere classified  Disorder of the skin and subcutaneous tissue related to radiation, unspecified  Abnormal posture  Malignant neoplasm of upper-outer quadrant of left breast in female, estrogen receptor positive ([place])  ONSET DATE: 08/24/2021  Rationale for Evaluation and Treatment Rehabilitation  SUBJECTIVE  SUBJECTIVE STATEMENT:   My pain in getting better. It is still uncomfortable under my arm but it is not as bad as it has been. The new exercises are getting easier.   PERTINENT HISTORY:  Diagnosed with left invasive breast cancer on 07/13/14 which is ER/PR positive and HER2 negative. Ki67 is 17%. Underwent L br lumpectomy and SLNB on 08/06/14 (0/2)  PAIN:  Are you having pain? Yes NPRS scale: 5/10 Pain location: lateral left breast Pain orientation: Left  PAIN TYPE: dull Pain description: constant  Aggravating factors: wearing a bra - top of bra rubs it Relieving factors: nothing  PRECAUTIONS: Other: at risk of lymphedema  WEIGHT BEARING RESTRICTIONS No  FALLS:  Has patient fallen in last 6 months? 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  LEISURE: walks daily for an hour  HAND DOMINANCE : right   PRIOR LEVEL OF FUNCTION: Independent  PATIENT GOALS for her arm to quit hurting   OBJECTIVE  COGNITION:  Overall cognitive status: Within functional limits for tasks assessed   PALPATION: Increased fibrosis and tightness along lateral border of pec and along serratus  POSTURE: forward head, rounded shoulders  UPPER EXTREMITY AROM/PROM:  A/PROM RIGHT   eval   Shoulder extension 51  Shoulder flexion 166  Shoulder abduction 172  Shoulder internal rotation 48  Shoulder external rotation 85    (Blank rows = not tested)  A/PROM LEFT   eval Left 12/26/21  Shoulder extension 58   Shoulder flexion 166   Shoulder abduction 154 148 (pt just got off work)  Shoulder internal rotation 45   Shoulder external rotation 84     (Blank rows = not tested)    UPPER EXTREMITY STRENGTH: grossly 5/5   LYMPHEDEMA ASSESSMENTS:   SURGERY TYPE/DATE: L breast lumpectomy and SLNB on 08/06/14  NUMBER OF LYMPH NODES REMOVED: 0/2  CHEMOTHERAPY: none  RADIATION: completed 11/09/14  HORMONE TREATMENT: stopped due to headaches in 2016  INFECTIONS: none  LYMPHEDEMA ASSESSMENTS:   LANDMARK RIGHT  eval  10 cm proximal to olecranon process 40  Olecranon process 29.5  10 cm proximal to ulnar styloid process 24.2  Just proximal to ulnar styloid process 17.2  Across hand at thumb web space 20  At base of 2nd digit 6.9  (Blank rows = not tested)  LANDMARK LEFT  eval  10 cm proximal to olecranon process 39.7  Olecranon process 30  10 cm proximal to ulnar styloid process 24.4  Just proximal to ulnar styloid process 16.7  Across hand at thumb web space 19.9  At base of 2nd digit 6.5  (Blank rows = not tested)    QUICK DASH SURVEY:       TODAY'S TREATMENT   12/26/21 STM using cocoa butter in supine and R  sidelying to L lateral trunk in area of serratus,lats and lateral edge of pec, where there is increased tightness and fibrosis Gentle PROM for left shoulder flexion, scaption abduction, ER.  Pulleys x 2 min in to flex and abduction with pt feeling a good stretch at end range Ball up wall x 10 reps in to flexion (bilaterally) and 10 reps in to abduction (L only) with pt feeling strong stretch at end range  12/22/2021  STM using cocoa butter in supine  and R sidelying to L lateral trunk in area of serratus,lats and lateral edge of pec, where there is increased tightness and fibrosis Gentle PROM for left shoulder flexion, scaption abduction, ER.   Instructed pt in supine scapular strengthening series: narrow and wide grip flexion, horizontal abduction, diagonals and ER all with yellow theraband x 10 reps each. Issued handout for pt to add to HEP to help improve strength and stretch.   12/16/2021  STM using cocoa butter in supine and R sidelying to L lateral trunk in area of serratus,lats and lateral edge of pec, where there is increased tightness and fibrosis Gentle PROM for left shoulder flexion, scaption abduction, ER.   12/15/2021  STM using cocoa butter in supine and R sidelying to L lateral trunk in area of serratus,lats and lateral edge of pec, UT where there is increased tightness/fibrosis. Gentle PROM for left shoulder flexion, scaption abduction, ER. VC's to get pt to relax LTR with arms outstretched  x 5 ea Supine wand flexion and scaption x 5   PATIENT EDUCATION:  Education details: Access Code: [email or web address] URL: [email or web address] Date: 12/08/2021 Prepared by: Cheral Almas  Exercises - Supine Lower Trunk Rotation  - 2 x daily - 7 x weekly - 1 sets - 5 reps - 10 hold - Supine Shoulder Flexion with Dowel  - 2 x daily - 7 x weekly - 1 sets - 5 reps - 10 hold - Standing Shoulder Abduction Slides at Wall  - 2 x daily - 7 x weekly - 1 sets - 5 reps - 10 hold - Single  Arm Doorway Pec Stretch at 90 Degrees Abduction  - 2 x daily - 7 x weekly - 1 sets - 3 reps - 10-20 hold Person educated: Patient Education method: Explanation Education comprehension: verbalized understanding   HOME EXERCISE PROGRAM: Continue self massage to area of tightness, LTR with arms outstretched and goal post, supine flexion and scaption with dowel, abduction wall slides, single arm pec stretch in doorway  ASSESSMENT: Pt reports she has been doing supine scapular exercises and is still feeling a stretch with them. Began AAROM today including pulleys and ball up wall. Pt demonstrates decreased abduction ROM but she reports it is because she just came from work. Focused on STM and PROM to L shoulder to improve abduction. Encouraged pt to continue stretches at home especially in to abduction.    OBJECTIVE IMPAIRMENTS decreased ROM, decreased strength, increased fascial restrictions, impaired UE functional use, postural dysfunction, and pain.   ACTIVITY LIMITATIONS none   PARTICIPATION LIMITATIONS:  none  PERSONAL FACTORS  none  are also affecting patient's functional outcome.   REHAB POTENTIAL: Excellent  CLINICAL DECISION MAKING: Stable/uncomplicated  EVALUATION COMPLEXITY: Low  GOALS: Goals reviewed with patient? Yes  SHORT TERM GOALS = LONG TERM GOALS  LONG TERM GOALS: Target date: 01/23/2022    Pt will report no pain in left lateral trunk or inferior axilla to allow her to work without increased pain. Baseline: 7/10 Goal status: INITIAL  2.  Pt will demonstrate 170 degrees of left shoulder abduction to allow her to reach out to the side.  Baseline: 154 Goal status: INITIAL  3.  Pt will be independent in a home exercise program for continued stretching and strengthening.  Baseline:  Goal status: INITIAL   PLAN: PT FREQUENCY: 2x/week  PT DURATION: 4 weeks  PLANNED INTERVENTIONS: Therapeutic exercises, Patient/Family education, Joint mobilization, Manual  lymph drainage, scar mobilization, and Manual therapy  PLAN FOR NEXT SESSION: pulleys,  ball up wall, Review supine scap series, continue STM and myofascial release along L lateral trunk and inferior axilla in area of tightness, PROM to L shoulder in to abduction, review stretches given   [employer], PT 12/26/2021, 11:58 AM

## 2021-12-28 ENCOUNTER — Ambulatory Visit: Payer: 59 | Admitting: Family Medicine

## 2021-12-29 ENCOUNTER — Encounter: Payer: Self-pay | Admitting: Physical Therapy

## 2021-12-29 ENCOUNTER — Ambulatory Visit: Payer: 59 | Admitting: Physical Therapy

## 2021-12-29 DIAGNOSIS — R293 Abnormal posture: Secondary | ICD-10-CM

## 2021-12-29 DIAGNOSIS — M25612 Stiffness of left shoulder, not elsewhere classified: Secondary | ICD-10-CM | POA: Diagnosis not present

## 2021-12-29 DIAGNOSIS — C50412 Malignant neoplasm of upper-outer quadrant of left female breast: Secondary | ICD-10-CM

## 2021-12-29 DIAGNOSIS — L599 Disorder of the skin and subcutaneous tissue related to radiation, unspecified: Secondary | ICD-10-CM

## 2021-12-29 NOTE — Therapy (Signed)
OUTPATIENT PHYSICAL THERAPY ONCOLOGY TREATMENT  Patient Name: [name] MRN: [id number] DOB:May 22, 1976, 46 y.o., female Today's Date: 12/29/2021   PT End of Session - 12/29/21 1008     Visit Number 8    Number of Visits 9    Date for PT Re-Evaluation 12/29/21    PT Start Time 1007    PT Stop Time 1049    PT Time Calculation (min) 42 min    Activity Tolerance Patient tolerated treatment well    Behavior During Therapy WFL for tasks assessed/performed             Past Medical History:  Diagnosis Date   Asthma    no current med.   Breast cancer of upper-outer quadrant of left female breast ([place]) 07/16/2014   Heart murmur    as a child   History of seizures    during pregnancies; last seizure 3 yrs. ago   Personal history of radiation therapy 2016   Seizures ([place])    no medications   Stress headaches    Past Surgical History:  Procedure Laterality Date   ABDOMINAL HYSTERECTOMY     BREAST BIOPSY Left 07/14/2014   x2   BREAST BIOPSY Left 08/21/2016   BREAST LUMPECTOMY Left 08/06/2014   BREAST SURGERY     lumpectomy and lymph nodes   CESAREAN SECTION  9030,0923   CESAREAN SECTION WITH BILATERAL TUBAL LIGATION  03/03/2003   CYSTO  12/20/2009   HYSTEROSCOPY W/ ENDOMETRIAL ABLATION  11/11/2007   LYSIS OF ADHESION  12/20/2009   TOTAL ABDOMINAL HYSTERECTOMY W/ BILATERAL SALPINGOOPHORECTOMY  12/20/2009   TYMPANOSTOMY TUBE PLACEMENT     Patient Active Problem List   Diagnosis Date Noted   Dizziness 11/16/2021   Urinary incontinence 11/16/2021   Eye twitch 11/16/2021   History of seizures 11/16/2021   Mild intermittent asthma without complication 30/12/6224   Morbid obesity ([place]) 11/10/2021   Mild persistent asthma with acute exacerbation 11/10/2021   Productive cough 11/10/2021   Vitamin D deficiency 10/31/2021   Prediabetes 10/31/2021   History of breast cancer 10/23/2019   Anxiety 07/05/2015   Breast cancer of upper-outer quadrant of left female breast ([place])  07/16/2014   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 12/20/2009   SEIZURE DISORDER 07/07/2009   DEPRESSION, RECURRENT 08/24/2008    PCP: [name], NP-C  REFERRING PROVIDER: Gardenia Phlegm, NP  REFERRING DIAG: C50.412,Z17.0 (ICD-10-CM) - Malignant neoplasm of upper-outer quadrant of left breast in female, estrogen receptor positive ([place])   THERAPY DIAG:  Stiffness of left shoulder, not elsewhere classified  Disorder of the skin and subcutaneous tissue related to radiation, unspecified  Abnormal posture  Malignant neoplasm of upper-outer quadrant of left breast in female, estrogen receptor positive ([place])  ONSET DATE: 08/24/2021  Rationale for Evaluation and Treatment Rehabilitation  SUBJECTIVE  SUBJECTIVE STATEMENT:   My pain in getting better. It is still uncomfortable under my arm but it is not as bad as it has been. The new exercises are getting easier.   PERTINENT HISTORY:  Diagnosed with left invasive breast cancer on 07/13/14 which is ER/PR positive and HER2 negative. Ki67 is 17%. Underwent L br lumpectomy and SLNB on 08/06/14 (0/2)  PAIN:  Are you having pain? Yes NPRS scale: 5/10 Pain location: lateral left breast Pain orientation: Left  PAIN TYPE: dull Pain description: constant  Aggravating factors: wearing a bra - top of bra rubs it Relieving factors: nothing  PRECAUTIONS: Other: at risk of lymphedema  WEIGHT BEARING RESTRICTIONS No  FALLS:  Has patient fallen in last 6 months? 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  LEISURE: walks daily for an hour  HAND DOMINANCE : right   PRIOR LEVEL OF FUNCTION: Independent  PATIENT GOALS for her arm to quit hurting   OBJECTIVE  COGNITION:  Overall cognitive status: Within functional limits for tasks assessed   PALPATION: Increased fibrosis and tightness along lateral border of pec and along serratus  POSTURE: forward head, rounded shoulders  UPPER EXTREMITY AROM/PROM:  A/PROM RIGHT   eval   Shoulder extension 51  Shoulder flexion 166  Shoulder abduction 172  Shoulder internal rotation 48  Shoulder external rotation 85    (Blank rows = not tested)  A/PROM LEFT   eval Left 12/26/21 L 12/29/21  Shoulder extension 58    Shoulder flexion 166  168  Shoulder abduction 154 148 (pt just got off work) 165  Shoulder internal rotation 45    Shoulder external rotation 84      (Blank rows = not tested)    UPPER EXTREMITY STRENGTH: grossly 5/5   LYMPHEDEMA ASSESSMENTS:   SURGERY TYPE/DATE: L breast lumpectomy and SLNB on 08/06/14  NUMBER OF LYMPH NODES REMOVED: 0/2  CHEMOTHERAPY: none  RADIATION: completed 11/09/14  HORMONE TREATMENT: stopped due to headaches in 2016  INFECTIONS: none  LYMPHEDEMA ASSESSMENTS:   LANDMARK RIGHT  eval  10 cm proximal to olecranon process 40  Olecranon process 29.5  10 cm proximal to ulnar styloid process 24.2  Just proximal to ulnar styloid process 17.2  Across hand at thumb web space 20  At base of 2nd digit 6.9  (Blank rows = not tested)  LANDMARK LEFT  eval  10 cm proximal to olecranon process 39.7  Olecranon process 30  10 cm proximal to ulnar styloid process 24.4  Just proximal to ulnar styloid process 16.7  Across hand at thumb web space 19.9  At base of 2nd digit 6.5  (Blank rows = not tested)    QUICK DASH SURVEY:       TODAY'S TREATMENT   12/29/21 PROM to L shoulder in to flexion, abduction and ER with prolonged holds with increased tightness noted at first but by end of  session pt had nearly full PROM STM to serratus anterior and axilla in supine to decrease tightness and improve comfort  12/26/21 STM using cocoa butter in supine and R sidelying to L lateral trunk in area of serratus,lats and lateral edge of pec, where there is increased tightness and fibrosis Gentle PROM for left shoulder flexion, scaption abduction, ER.  Pulleys  x 2 min in to flex and abduction with pt feeling a good stretch at end range Ball up wall x 10 reps in to flexion (bilaterally) and 10 reps in to abduction (L only) with pt feeling strong stretch at end range  12/22/2021  STM using cocoa butter in supine and R sidelying to L lateral trunk in area of serratus,lats and lateral edge of pec, where there is increased tightness and fibrosis Gentle PROM for left shoulder flexion, scaption abduction, ER.   Instructed pt in supine scapular strengthening series: narrow and wide grip flexion, horizontal abduction, diagonals and ER all with yellow theraband x 10 reps each. Issued handout for pt to add to HEP to help improve strength and stretch.   12/16/2021  STM using cocoa butter in supine and R sidelying to L lateral trunk in area of serratus,lats and lateral edge of pec, where there is increased tightness and fibrosis Gentle PROM for left shoulder flexion, scaption abduction, ER.   12/15/2021  STM using cocoa butter in supine and R sidelying to L lateral trunk in area of serratus,lats and lateral edge of pec, UT where there is increased tightness/fibrosis. Gentle PROM for left shoulder flexion, scaption abduction, ER. VC's to get pt to relax LTR with arms outstretched  x 5 ea Supine wand flexion and scaption x 5   PATIENT EDUCATION:  Education details: Access Code: [email or web address] URL: [email or web address] Date: 12/08/2021 Prepared by: Cheral Almas  Exercises - Supine Lower Trunk Rotation  - 2 x daily - 7 x weekly - 1 sets - 5 reps - 10 hold - Supine Shoulder Flexion with  Dowel  - 2 x daily - 7 x weekly - 1 sets - 5 reps - 10 hold - Standing Shoulder Abduction Slides at Wall  - 2 x daily - 7 x weekly - 1 sets - 5 reps - 10 hold - Single Arm Doorway Pec Stretch at 90 Degrees Abduction  - 2 x daily - 7 x weekly - 1 sets - 3 reps - 10-20 hold Person educated: Patient Education method: Explanation Education comprehension: verbalized understanding   HOME EXERCISE PROGRAM: Continue self massage to area of tightness, LTR with arms outstretched and goal post, supine flexion and scaption with dowel, abduction wall slides, single arm pec stretch in doorway  ASSESSMENT: Pt reports she is ready to be discharged from skilled PT services as she has a hard limit of 10 visits from her insurance company. She would like to save her 2 additional visits. She has met 2 of her 3 goals and has made great progress towards her pain goal (from 7/10 to 3/10). She is still having tightness in her axilla that improves with stretching. Encouraged pt to obtain a set of over the door pulleys to help to continue to stretch at home. Pt will be discharged from skilled PT services at this time.   OBJECTIVE IMPAIRMENTS decreased ROM, decreased strength, increased fascial restrictions, impaired UE functional use, postural dysfunction, and pain.   ACTIVITY LIMITATIONS none   PARTICIPATION LIMITATIONS:  none  PERSONAL FACTORS  none  are also affecting patient's functional outcome.   REHAB POTENTIAL: Excellent  CLINICAL DECISION MAKING: Stable/uncomplicated  EVALUATION COMPLEXITY: Low  GOALS: Goals reviewed with patient? Yes  SHORT TERM GOALS = LONG TERM GOALS  LONG TERM GOALS: Target date: 01/26/2022    Pt will report no pain in left lateral trunk or inferior axilla to allow her to work without increased pain. Baseline: 7/10; 12/29/21- 3/10 Goal status:  NOT MET   2.  Pt will demonstrate 170 degrees of left shoulder abduction to allow her to reach out to the side.  Baseline: 154; 12/29/21-  145, after stretching 165 Goal status: MET  3.  Pt will be independent in a home exercise program for continued stretching and strengthening.  Baseline: 12/29/21- pt is independent with these Goal status: MET   PLAN: PT FREQUENCY: 2x/week  PT DURATION: 4 weeks  PLANNED INTERVENTIONS: Therapeutic exercises, Patient/Family education, Joint mobilization, Manual lymph drainage, scar mobilization, and Manual therapy  PLAN FOR NEXT SESSION: d/c this session   Manus Gunning, PT 12/29/2021, 11:02 AM   PHYSICAL THERAPY DISCHARGE SUMMARY  Visits from Start of Care: 8  Current functional level related to goals / functional outcomes: Goals partially met, pt still with pain but much improved    Remaining deficits: Still has pain in left lateral trunk   Education / Equipment: HEP   Patient agrees to discharge. Patient goals were partially met. Patient is being discharged due to the patient's request.  Manus Gunning, PT 12/29/21 11:02 AM

## 2022-01-02 ENCOUNTER — Telehealth: Payer: 59 | Admitting: Physician Assistant

## 2022-01-02 DIAGNOSIS — H109 Unspecified conjunctivitis: Secondary | ICD-10-CM

## 2022-01-02 MED ORDER — POLYMYXIN B-TRIMETHOPRIM 10000-0.1 UNIT/ML-% OP SOLN: OPHTHALMIC | 0 refills | Status: DC

## 2022-01-02 NOTE — Progress Notes (Signed)

## 2022-01-04 ENCOUNTER — Ambulatory Visit
Admission: EM | Admit: 2022-01-04 | Discharge: 2022-01-04 | Disposition: A | Discharge status: Home / Self Care | Payer: 59 | Attending: Emergency Medicine | Admitting: Emergency Medicine

## 2022-01-04 DIAGNOSIS — H1133 Conjunctival hemorrhage, bilateral: Secondary | ICD-10-CM | POA: Diagnosis not present

## 2022-01-04 DIAGNOSIS — H1033 Unspecified acute conjunctivitis, bilateral: Secondary | ICD-10-CM

## 2022-01-04 DIAGNOSIS — S0500XA Injury of conjunctiva and corneal abrasion without foreign body, unspecified eye, initial encounter: Secondary | ICD-10-CM

## 2022-01-04 MED ORDER — CIPROFLOXACIN HCL 0.3 % OP SOLN: 2.0000 [drp] | OPHTHALMIC | 0 refills | Status: DC

## 2022-01-04 NOTE — ED Provider Notes (Signed)
[name]   MRN: [id number] DOB: [birth year]  Subjective:   Chief Complaint;  Chief Complaint  Patient presents with   Conjunctivitis    [name] is a 46 y.o. female presenting for bilateral eye discharge for the last couple of days and sensation of little pebbles in her eyes.  She denies any known foreign body and admits to scratching her eyes.  She is waking up with yellow discharge first thing in the morning and throughout the day she does have history of allergies  No current facility-administered medications for this encounter.  Current Outpatient Medications:    ciprofloxacin (CILOXAN) 0.3 % ophthalmic solution, Place 2 drops into the left eye every 4 (four) hours while awake., Disp: 5 mL, Rfl: 0   albuterol (VENTOLIN HFA) 108 (90 Base) MCG/ACT inhaler, Inhale 1-2 puffs into the lungs every 6 (six) hours as needed for wheezing or shortness of breath., Disp: 1 each, Rfl: 0   famotidine (PEPCID) 10 MG tablet, Take 10 mg by mouth daily., Disp: , Rfl:    fluticasone (FLOVENT HFA) 110 MCG/ACT inhaler, Inhale 2 puffs into the lungs in the morning and at bedtime., Disp: 1 each, Rfl: 1   meclizine (ANTIVERT) 25 MG tablet, Take 1 tablet (25 mg total) by mouth 2 (two) times daily as needed for dizziness., Disp: 30 tablet, Rfl: 0   naproxen sodium (ALEVE) 220 MG tablet, Take 220 mg by mouth as needed (pain)., Disp: , Rfl:    Semaglutide,0.25 or 0.'5MG'$ /DOS, (OZEMPIC, 0.25 OR 0.5 MG/DOSE,) 2 MG/3ML SOPN, Inject 0.25 mg into the skin once a week., Disp: 3 mL, Rfl: 0   No Known Allergies  Past Medical History:  Diagnosis Date   Asthma    no current med.   Breast cancer of upper-outer quadrant of left female breast ([place]) 07/16/2014   Heart murmur    as a child   History of seizures    during pregnancies; last seizure 3 yrs. ago   Personal history of radiation therapy 2016   Seizures ([facility])    no medications   Stress headaches      Review of Systems  All  other systems reviewed and are negative.    Objective:   Vitals: BP 135/82 (BP Location: Left Arm)   Pulse 78   Temp 98 F (36.7 C) (Oral)   Resp 18   SpO2 97%   Physical Exam Vitals reviewed.  Constitutional:      Appearance: Normal appearance. She is not toxic-appearing.  HENT:     Head: Atraumatic.     Right Ear: Tympanic membrane and external ear normal.     Left Ear: Tympanic membrane and external ear normal.     Nose: Nose normal. No congestion.     Mouth/Throat:     Mouth: Mucous membranes are moist.  Eyes:     General: No scleral icterus.       Right eye: No discharge.        Left eye: No discharge.     Extraocular Movements: Extraocular movements intact.     Pupils: Pupils are equal, round, and reactive to light.     Comments: Bilateral conjunctivitis and inferior subconjunctival hemorrhage OD, no gross vision changes.  Woods lamp stain reveals very superficial corneal abrasion at noon on the right pinpoint and in the area inferior aspect at 7:00.  OS has small corneal abrasions 2 mm at 9 and 3:00 just outside the iris.  No foreign body  lid flip is negative, PERRLA EOMs intact.  No ulcerations positive yellow discharge noted  Cardiovascular:     Rate and Rhythm: Normal rate and regular rhythm.  Pulmonary:     Effort: Pulmonary effort is normal. No respiratory distress.     Breath sounds: Normal breath sounds. No stridor. No wheezing, rhonchi or rales.  Chest:     Chest wall: No tenderness.  Skin:    General: Skin is warm and dry.  Neurological:     Mental Status: She is alert.  Psychiatric:        Mood and Affect: Mood normal.     No results found for this or any previous visit (from the past 24 hour(s)).  No results found.     Assessment and Plan :   1. Acute bacterial conjunctivitis of both eyes   2. Corneal abrasion, unspecified laterality, initial encounter   3. Subconjunctival hemorrhage of both eyes     Meds ordered this encounter   Medications   ciprofloxacin (CILOXAN) 0.3 % ophthalmic solution    Sig: Place 2 drops into the left eye every 4 (four) hours while awake.    Dispense:  5 mL    Refill:  0    Order Specific Question:   Supervising Provider    Answer:   Chase Picket [[id number]]    MDM:  [name] is a 46 y.o. female presenting for bilateral eye discharge and irritation.  Her physical exam is consistent with few superficial corneal abrasions bilateral, subconjunctival hemorrhage and f bacterial conjunctivitis.  I prescribed Cipro drops and Motrin and Tylenol as needed for pain which.  I discussed todays findings, treatment plan, follow up and return instructions. Questions were answered. Patient/representative stated understanding of the instructions and patient is stable for discharge.  Leida Lauth FNP-C MSN    Hezzie Bump, NP 01/04/22 [phone]

## 2022-01-04 NOTE — ED Triage Notes (Signed)
Pt c/o bilat conjunctivitis onset ~ 3 days ago. Started in right eye and moved to left. Was given rx via telehealth but [facility] unable to get in stock. States it is getting worse and now has photosensitivity.

## 2022-01-04 NOTE — Discharge Instructions (Addendum)
Use eyedrops to every 4 hours while awake for the next 5 days and until eye is completely clear do not itch or scratch eyes do not wash your lids may use saline drops in between.  You also had corneal abrasions and subconjunctival hemorrhages likely due to secondary scratching.  Follow-up with eye specialist if not improving in the next 3 to 4 days for reevaluation sooner if worse

## 2022-02-20 ENCOUNTER — Emergency Department (HOSPITAL_COMMUNITY): Payer: 59

## 2022-02-20 ENCOUNTER — Encounter: Payer: Self-pay | Admitting: Family Medicine

## 2022-02-20 ENCOUNTER — Ambulatory Visit (INDEPENDENT_AMBULATORY_CARE_PROVIDER_SITE_OTHER): Payer: 59 | Admitting: Family Medicine

## 2022-02-20 ENCOUNTER — Other Ambulatory Visit: Payer: Self-pay

## 2022-02-20 ENCOUNTER — Encounter (HOSPITAL_COMMUNITY): Payer: Self-pay

## 2022-02-20 ENCOUNTER — Emergency Department (HOSPITAL_COMMUNITY)
Admission: EM | Admit: 2022-02-20 | Discharge: 2022-02-20 | Discharge status: Against Medical Advice | Payer: 59 | Attending: Emergency Medicine | Admitting: Emergency Medicine

## 2022-02-20 VITALS — BP 118/20 | HR 72 | Temp 98.0°F | Ht 63.0 in | Wt 272.0 lb

## 2022-02-20 DIAGNOSIS — R110 Nausea: Secondary | ICD-10-CM | POA: Diagnosis not present

## 2022-02-20 DIAGNOSIS — R350 Frequency of micturition: Secondary | ICD-10-CM | POA: Diagnosis not present

## 2022-02-20 DIAGNOSIS — R109 Unspecified abdominal pain: Secondary | ICD-10-CM | POA: Insufficient documentation

## 2022-02-20 DIAGNOSIS — R300 Dysuria: Secondary | ICD-10-CM | POA: Diagnosis not present

## 2022-02-20 DIAGNOSIS — Z5321 Procedure and treatment not carried out due to patient leaving prior to being seen by health care provider: Secondary | ICD-10-CM | POA: Insufficient documentation

## 2022-02-20 DIAGNOSIS — R1031 Right lower quadrant pain: Secondary | ICD-10-CM | POA: Insufficient documentation

## 2022-02-20 DIAGNOSIS — R10823 Right lower quadrant rebound abdominal tenderness: Secondary | ICD-10-CM | POA: Diagnosis not present

## 2022-02-20 DIAGNOSIS — M545 Low back pain, unspecified: Secondary | ICD-10-CM | POA: Diagnosis not present

## 2022-02-20 LAB — COMPREHENSIVE METABOLIC PANEL
ALT: 14 U/L (ref 0–35)
AST: 14 U/L (ref 0–37)
Albumin: 3.9 g/dL (ref 3.5–5.2)
Alkaline Phosphatase: 94 U/L (ref 39–117)
BUN: 15 mg/dL (ref 6–23)
CO2: 28 mEq/L (ref 19–32)
Calcium: 9 mg/dL (ref 8.4–10.5)
Chloride: 103 mEq/L (ref 96–112)
Creatinine, Ser: 0.95 mg/dL (ref 0.40–1.20)
GFR: 72.39 mL/min (ref >60.00)
Glucose, Bld: 103 mg/dL — ABNORMAL HIGH (ref 70–99)
Potassium: 3.9 mEq/L (ref 3.5–5.1)
Sodium: 138 mEq/L (ref 135–145)
Total Bilirubin: 0.3 mg/dL (ref 0.2–1.2)
Total Protein: 6.9 g/dL (ref 6.0–8.3)

## 2022-02-20 LAB — CBC WITH DIFFERENTIAL/PLATELET
Basophils Absolute: 0.1 10*3/uL (ref 0.0–0.1)
Basophils Relative: 0.8 % (ref 0.0–3.0)
Eosinophils Absolute: 0.1 10*3/uL (ref 0.0–0.7)
Eosinophils Relative: 1.4 % (ref 0.0–5.0)
HCT: 39.8 % (ref 36.0–46.0)
Hemoglobin: 13 g/dL (ref 12.0–15.0)
Lymphocytes Relative: 20.3 % (ref 12.0–46.0)
Lymphs Abs: 1.3 10*3/uL (ref 0.7–4.0)
MCHC: 32.6 g/dL (ref 30.0–36.0)
MCV: 81.8 fl (ref 78.0–100.0)
Monocytes Absolute: 0.4 10*3/uL (ref 0.1–1.0)
Monocytes Relative: 6.4 % (ref 3.0–12.0)
Neutro Abs: 4.7 10*3/uL (ref 1.4–7.7)
Neutrophils Relative %: 71.1 % (ref 43.0–77.0)
Platelets: 289 10*3/uL (ref 150.0–400.0)
RBC: 4.87 Mil/uL (ref 3.87–5.11)
RDW: 15.4 % (ref 11.5–15.5)
WBC: 6.6 10*3/uL (ref 4.0–10.5)

## 2022-02-20 LAB — POC URINALSYSI DIPSTICK (AUTOMATED)
Bilirubin, UA: 1
Blood, UA: NEGATIVE
Glucose, UA: NEGATIVE
Ketones, UA: NEGATIVE
Leukocytes, UA: NEGATIVE
Nitrite, UA: NEGATIVE
Protein, UA: NEGATIVE
Spec Grav, UA: >=1.03 — AB (ref 1.010–1.025)
Urobilinogen, UA: 0.2 E.U./dL
pH, UA: 6 (ref 5.0–8.0)

## 2022-02-20 MED ORDER — IOHEXOL 300 MG/ML  SOLN
100.0000 mL | Freq: Once | INTRAMUSCULAR | Status: AC | PRN
  Administered 2022-02-20: 100 mL via INTRAVENOUS

## 2022-02-20 NOTE — Progress Notes (Signed)
Subjective:     Patient ID: [name], female    DOB: 1975/08/30, 46 y.o.   MRN: [id number]  Chief Complaint  Patient presents with   Abdominal Pain    Rt sided abdomen pain    HPI Patient is in today for a 12 hour hx of an intermittent sharp RLQ pain.   Movement makes her pain worse. Eating does not aggravate symptoms.   Urinary frequency which is not new and burning at times. Drinking more soda than usual, 40 ounces per day.   Denies fever, chills, dizziness, chest pain, palpitations, shortness of breath,  N/V/D.  Normal bowel movements.      Health Maintenance Due  Topic Date Due   COVID-19 Vaccine (1) Never done   COLONOSCOPY (Pts 45-23yr Insurance coverage will need to be confirmed)  Never done   INFLUENZA VACCINE  01/24/2022    Past Medical History:  Diagnosis Date   Asthma    no current med.   [place]   Heart murmur    as a child   History of seizures    during pregnancies; last seizure 3 yrs. ago   Personal history of radiation therapy 2016   [place]    no medications   Stress headaches     Past Surgical History:  Procedure Laterality Date   ABDOMINAL HYSTERECTOMY     BREAST BIOPSY Left 07/14/2014   x2   BREAST BIOPSY Left 08/21/2016   BREAST LUMPECTOMY Left 08/06/2014   BREAST SURGERY     lumpectomy and lymph nodes   CESAREAN SECTION  16295,2841  CESAREAN SECTION WITH BILATERAL TUBAL LIGATION  03/03/2003   CYSTO  12/20/2009   HYSTEROSCOPY W/ ENDOMETRIAL ABLATION  11/11/2007   LYSIS OF ADHESION  12/20/2009   TOTAL ABDOMINAL HYSTERECTOMY W/ BILATERAL SALPINGOOPHORECTOMY  12/20/2009   TYMPANOSTOMY TUBE PLACEMENT      Family History  Problem Relation Age of Onset   Hypertension Mother    Breast cancer Other        MGM's sister   Breast cancer Maternal Grandmother        dx in her 41s   Social History   Socioeconomic History   Marital status: Single     Spouse name: Not on file   Number of children: 3   Years of education: Not on file   Highest education level: Not on file  Occupational History   Not on file  Tobacco Use   Smoking status: Never   Smokeless tobacco: Never  Vaping Use   Vaping Use: Never used  Substance and Sexual Activity   Alcohol use: Not Currently    Comment: rarely   Drug use: No   Sexual activity: Yes    Birth control/protection: None  Other Topics Concern   Not on file  Social History Narrative   Not on file   Social Determinants of Health   Financial Resource Strain: Not on file  Food Insecurity: Not on file  Transportation Needs: Not on file  Physical Activity: Not on file  Stress: Not on file  Social Connections: Not on file  Intimate Partner Violence: Not on file    Outpatient Medications Prior to Visit  Medication Sig Dispense Refill   albuterol (VENTOLIN HFA) 108 (90 Base) MCG/ACT inhaler Inhale 1-2 puffs into the lungs every 6 (six) hours as needed for wheezing or shortness of breath. 1 each 0   famotidine (  PEPCID) 10 MG tablet Take 10 mg by mouth daily.     fluticasone (FLOVENT HFA) 110 MCG/ACT inhaler Inhale 2 puffs into the lungs in the morning and at bedtime. 1 each 1   meclizine (ANTIVERT) 25 MG tablet Take 1 tablet (25 mg total) by mouth 2 (two) times daily as needed for dizziness. 30 tablet 0   naproxen sodium (ALEVE) 220 MG tablet Take 220 mg by mouth as needed (pain).     Semaglutide,0.25 or 0.'5MG'$ /DOS, (OZEMPIC, 0.25 OR 0.5 MG/DOSE,) 2 MG/3ML SOPN Inject 0.25 mg into the skin once a week. (Patient not taking: Reported on 02/20/2022) 3 mL 0   ciprofloxacin (CILOXAN) 0.3 % ophthalmic solution Place 2 drops into the left eye every 4 (four) hours while awake. 5 mL 0   No facility-administered medications prior to visit.    No Known Allergies  ROS     Objective:    Physical Exam Constitutional:      General: She is not in acute distress.    Appearance: She is obese. She is  ill-appearing.  Abdominal:     General: Abdomen is protuberant. Bowel sounds are normal.     Palpations: Abdomen is soft.     Tenderness: There is abdominal tenderness in the right lower quadrant. There is rebound. There is no right CVA tenderness, left CVA tenderness or guarding. Positive signs include psoas sign. Negative signs include Murphy's sign.  Skin:    General: Skin is warm and dry.  Neurological:     General: No focal deficit present.     Mental Status: She is alert and oriented to person, place, and time.     Cranial Nerves: No cranial nerve deficit.     Motor: No weakness.  Psychiatric:        Mood and Affect: Mood normal.        Behavior: Behavior normal.     BP (!) 118/20 (BP Location: Left Arm, Patient Position: Sitting, Cuff Size: Large)   Pulse 72   Temp 98 F (36.7 C) (Oral)   Ht '5\' 3"'$  (1.6 m)   Wt 272 lb (123.4 kg)   SpO2 96%   BMI 48.18 kg/m  Wt Readings from Last 3 Encounters:  02/20/22 272 lb (123.4 kg)  11/16/21 273 lb (123.8 kg)  11/10/21 275 lb (124.7 kg)       Assessment & Plan:   Problem List Items Addressed This Visit       Other   Right lower quadrant abdominal pain - Primary    Urinalysis dipstick negative for infection.  CT abdomen pelvis ordered.  Stat CBC and CMP ordered.  She may take Tylenol ibuprofen as needed for pain. Is not currently in acute distress.  Strict precautions that if she is worsening that she will need to go the emergency department.      Relevant Orders   POCT Urinalysis Dipstick (Automated) (Completed)   CBC with Differential/Platelet   Comprehensive metabolic panel   CT Abdomen Pelvis W Contrast   Other Visit Diagnoses     Dysuria       Relevant Orders   POCT Urinalysis Dipstick (Automated) (Completed)   Right lower quadrant abdominal tenderness with rebound tenderness       Relevant Orders   CBC with Differential/Platelet   Comprehensive metabolic panel   CT Abdomen Pelvis W Contrast       I have  discontinued Katria M. Kawashima's ciprofloxacin. I am also having her maintain her famotidine, naproxen sodium,  fluticasone, albuterol, meclizine, and Ozempic (0.25 or 0.5 MG/DOSE).  No orders of the defined types were placed in this encounter.

## 2022-02-20 NOTE — ED Notes (Signed)
Needs IV/ HCG

## 2022-02-20 NOTE — ED Notes (Addendum)
Patient left without being seen.IV was removed

## 2022-02-20 NOTE — ED Triage Notes (Signed)
Patient c/o RLQ abdominal pain that radiates into the right lower back x 2 days. Patient also c/o nausea and urinary frequency.

## 2022-02-20 NOTE — Assessment & Plan Note (Signed)
Urinalysis dipstick negative for infection.  CT abdomen pelvis ordered.  Stat CBC and CMP ordered.  She may take Tylenol ibuprofen as needed for pain. Is not currently in acute distress.  Strict precautions that if she is worsening that she will need to go the emergency department.

## 2022-02-20 NOTE — ED Notes (Signed)
Needs IV

## 2022-02-20 NOTE — Patient Instructions (Addendum)
You do not appear to have a urinary tract infection   Please go to the lab on the first floor before you leave today.  I have ordered a CAT scan of your abdomen and pelvis and they will call you to schedule this today.  If you are getting severely worse and have any new symptoms such as vomiting, fever, etc. then you will need to go to the emergency department.

## 2022-02-21 ENCOUNTER — Telehealth: Payer: Self-pay | Admitting: Family Medicine

## 2022-02-21 NOTE — Telephone Encounter (Signed)
Patient had her ct scan done at [facility] but did not stay to get her results - can someone please call her and give her the results

## 2022-02-22 ENCOUNTER — Ambulatory Visit (INDEPENDENT_AMBULATORY_CARE_PROVIDER_SITE_OTHER): Payer: 59

## 2022-02-22 ENCOUNTER — Ambulatory Visit (INDEPENDENT_AMBULATORY_CARE_PROVIDER_SITE_OTHER): Payer: 59 | Admitting: Family Medicine

## 2022-02-22 ENCOUNTER — Encounter: Payer: Self-pay | Admitting: Family Medicine

## 2022-02-22 VITALS — BP 126/86 | HR 61 | Temp 97.8°F | Ht 63.0 in | Wt 273.0 lb

## 2022-02-22 DIAGNOSIS — R109 Unspecified abdominal pain: Secondary | ICD-10-CM | POA: Diagnosis not present

## 2022-02-22 DIAGNOSIS — R051 Acute cough: Secondary | ICD-10-CM | POA: Diagnosis not present

## 2022-02-22 DIAGNOSIS — R918 Other nonspecific abnormal finding of lung field: Secondary | ICD-10-CM | POA: Diagnosis not present

## 2022-02-22 DIAGNOSIS — J4520 Mild intermittent asthma, uncomplicated: Secondary | ICD-10-CM

## 2022-02-22 DIAGNOSIS — R058 Other specified cough: Secondary | ICD-10-CM | POA: Insufficient documentation

## 2022-02-22 MED ORDER — MELOXICAM 15 MG PO TABS: 15.0000 mg | ORAL_TABLET | Freq: Every day | ORAL | 0 refills | Status: DC

## 2022-02-22 NOTE — Progress Notes (Signed)
Subjective:     Patient ID: [name], female    DOB: 12-Aug-1975, 46 y.o.   MRN: [id number]  Chief Complaint  Patient presents with   Follow-up    Would like for Tieisha Darden to go over CT scan results    HPI Patient is in today for follow up on RLQ pain. She was evaluated in the ED and here by me 2 days ago for the same.  Movement makes her pain worse. Hx of right hip pain.  She is not having urinary symptoms.    Mild cough - not new and shortness of breath with activity.  States she has asthma and is not using her inhaler. States she also knows she is short of breath because of her weight.   She is interested in weight loss medications.   Denies fever, chills, dizziness, chest pain, palpitations, shortness of breath, N/V/D, urinary symptoms, LE edema.    IMPRESSION: No acute findings are seen in abdomen and pelvis. There is no evidence of intestinal obstruction or pneumoperitoneum. Appendix is not dilated. There is no hydronephrosis.   Small linear ground-glass densities in both lower lung fields may suggest scarring or early pneumonia.     Electronically Signed   By: Elmer Picker M.D.   On: 02/20/2022 20:37     Health Maintenance Due  Topic Date Due   COVID-19 Vaccine (1) Never done   COLONOSCOPY (Pts 45-91yr Insurance coverage will need to be confirmed)  Never done   INFLUENZA VACCINE  01/24/2022    Past Medical History:  Diagnosis Date   Asthma    no current med.   Breast cancer of upper-outer quadrant of left female breast ([facility] 07/16/2014   Heart murmur    as a child   History of seizures    during pregnancies; last seizure 3 yrs. ago   Personal history of radiation therapy 2016   Seizures [place]    no medications   Stress headaches     Past Surgical History:  Procedure Laterality Date   ABDOMINAL HYSTERECTOMY     BREAST BIOPSY Left 07/14/2014   x2   BREAST BIOPSY Left 08/21/2016   BREAST LUMPECTOMY Left 08/06/2014   BREAST  SURGERY     lumpectomy and lymph nodes   CESAREAN SECTION  [id number]  CESAREAN SECTION WITH BILATERAL TUBAL LIGATION  03/03/2003   CYSTO  12/20/2009   HYSTEROSCOPY W/ ENDOMETRIAL ABLATION  11/11/2007   LYSIS OF ADHESION  12/20/2009   TOTAL ABDOMINAL HYSTERECTOMY W/ BILATERAL SALPINGOOPHORECTOMY  12/20/2009   TYMPANOSTOMY TUBE PLACEMENT      Family History  Problem Relation Age of Onset   Hypertension Mother    Breast cancer Other        MGM's sister   Breast cancer Maternal Grandmother        dx in her 445s   Social History   Socioeconomic History   Marital status: Single    Spouse name: Not on file   Number of children: 3   Years of education: Not on file   Highest education level: Not on file  Occupational History   Not on file  Tobacco Use   Smoking status: Never   Smokeless tobacco: Never  Vaping Use   Vaping Use: Never used  Substance and Sexual Activity   Alcohol use: Not Currently    Comment: rarely   Drug use: No   Sexual activity: Yes    Birth control/protection: None  Other Topics Concern  Not on file  Social History Narrative   Not on file   Social Determinants of Health   Financial Resource Strain: Not on file  Food Insecurity: Not on file  Transportation Needs: Not on file  Physical Activity: Not on file  Stress: Not on file  Social Connections: Not on file  Intimate Partner Violence: Not on file    Outpatient Medications Prior to Visit  Medication Sig Dispense Refill   albuterol (VENTOLIN HFA) 108 (90 Base) MCG/ACT inhaler Inhale 1-2 puffs into the lungs every 6 (six) hours as needed for wheezing or shortness of breath. 1 each 0   fluticasone (FLOVENT HFA) 110 MCG/ACT inhaler Inhale 2 puffs into the lungs in the morning and at bedtime. 1 each 1   naproxen sodium (ALEVE) 220 MG tablet Take 220 mg by mouth as needed (pain).     famotidine (PEPCID) 10 MG tablet Take 10 mg by mouth daily.     meclizine (ANTIVERT) 25 MG tablet Take 1 tablet (25 mg  total) by mouth 2 (two) times daily as needed for dizziness. (Patient not taking: Reported on 02/22/2022) 30 tablet 0   Semaglutide,0.25 or 0.'5MG'$ /DOS, (OZEMPIC, 0.25 OR 0.5 MG/DOSE,) 2 MG/3ML SOPN Inject 0.25 mg into the skin once a week. (Patient not taking: Reported on 02/20/2022) 3 mL 0   No facility-administered medications prior to visit.    No Known Allergies  ROS     Objective:    Physical Exam Constitutional:      General: She is not in acute distress.    Appearance: She is not ill-appearing.  Eyes:     Extraocular Movements: Extraocular movements intact.     Conjunctiva/sclera: Conjunctivae normal.     Pupils: Pupils are equal, round, and reactive to light.  Cardiovascular:     Rate and Rhythm: Normal rate and regular rhythm.  Pulmonary:     Effort: Pulmonary effort is normal.     Breath sounds: Normal breath sounds.  Abdominal:     General: Bowel sounds are normal. There is no distension.     Palpations: Abdomen is soft.     Tenderness: There is no abdominal tenderness. There is no right CVA tenderness, left CVA tenderness, guarding or rebound.  Musculoskeletal:     Comments: Pain in right hip with abduction   Skin:    General: Skin is warm and dry.     Findings: No rash.     Comments: Hyperesthesia of her right flank into right side, no rash  Neurological:     General: No focal deficit present.     Mental Status: She is alert and oriented to person, place, and time.     Gait: Gait normal.  Psychiatric:        Mood and Affect: Mood normal.        Behavior: Behavior normal.     BP 126/86 (BP Location: Right Arm, Patient Position: Sitting, Cuff Size: Large)   Pulse 61   Temp 97.8 F (36.6 C) (Temporal)   Ht '5\' 3"'$  (1.6 m)   Wt 273 lb (123.8 kg)   SpO2 96%   BMI 48.36 kg/m  Wt Readings from Last 3 Encounters:  02/22/22 273 lb (123.8 kg)  02/20/22 272 lb (123.4 kg)  02/20/22 272 lb (123.4 kg)       Assessment & Plan:   Problem List Items Addressed  This Visit       Respiratory   Mild intermittent asthma without complication    Lungs  CTA. Recommend she use albuterol inhaler when needed.         Other   Abnormal radiologic finding of lung field - Primary    CT abdomen showed lower lung fields with scarring vs early pneumonia. No sign of infection, lungs CTA. Chest XR ordered which was negative.       Relevant Orders   DG Chest 2 View (Completed)   Acute cough    Asthma related. Unchanged.       Relevant Orders   DG Chest 2 View (Completed)   Morbid obesity [facility])    May return for further discussion of [place]      Right sided abdominal pain    CT abdomen results reviewed with patient. Normal appearing appendix, gallbladder and no acute intra abdominal findings to explain pain. Discussed possible MSK etiology and prescribed meloxicam for the next 2 weeks. We also discussed shingles as a possible etiology and she will let me know if a rash appears. Follow up if not improving.       Relevant Medications   meloxicam (MOBIC) 15 MG tablet    I have discontinued Adalee M. Obryan's famotidine, meclizine, and Ozempic (0.25 or 0.5 MG/DOSE). I am also having her start on meloxicam. Additionally, I am having her maintain her naproxen sodium, fluticasone, and albuterol.  Meds ordered this encounter  Medications   meloxicam (MOBIC) 15 MG tablet    Sig: Take 1 tablet (15 mg total) by mouth daily.    Dispense:  30 tablet    Refill:  0    Order Specific Question:   Supervising Provider    Answer:   Pricilla Holm A [[id number]]

## 2022-02-22 NOTE — Assessment & Plan Note (Signed)
CT abdomen showed lower lung fields with scarring vs early pneumonia. No sign of infection, lungs CTA. Chest XR ordered which was negative.

## 2022-02-22 NOTE — Assessment & Plan Note (Addendum)
CT abdomen results reviewed with patient. Normal appearing appendix, gallbladder and no acute intra abdominal findings to explain pain. Discussed possible MSK etiology and prescribed meloxicam for the next 2 weeks. We also discussed shingles as a possible etiology and she will let me know if a rash appears. Follow up if not improving.

## 2022-02-22 NOTE — Assessment & Plan Note (Signed)
Asthma related. Unchanged.

## 2022-02-22 NOTE — Assessment & Plan Note (Signed)
May return for further discussion of [facility]

## 2022-02-22 NOTE — Assessment & Plan Note (Signed)
Lungs CTA. Recommend she use albuterol inhaler when needed.

## 2022-02-22 NOTE — Patient Instructions (Signed)
Please go downstairs for chest x-ray before you leave.  Use your albuterol inhaler as needed for asthma, wheezing or shortness of breath.  I will be in touch with your results  Start the anti-inflammatory prescription medication daily with food for the next week or 2 and let me know if you develop a rash on your side or if the pain is not improving

## 2022-02-24 ENCOUNTER — Encounter: Payer: Self-pay | Admitting: Adult Health

## 2022-02-24 ENCOUNTER — Telehealth: Payer: Self-pay | Admitting: *Deleted

## 2022-02-24 ENCOUNTER — Other Ambulatory Visit: Payer: Self-pay

## 2022-02-24 ENCOUNTER — Inpatient Hospital Stay: Payer: 59 | Attending: Adult Health | Admitting: Adult Health

## 2022-02-24 VITALS — BP 120/72 | HR 67 | Temp 97.8°F | Resp 14 | Wt 274.8 lb

## 2022-02-24 DIAGNOSIS — Z170 Estrogen receptor positive status [ER+]: Secondary | ICD-10-CM | POA: Diagnosis not present

## 2022-02-24 DIAGNOSIS — C50412 Malignant neoplasm of upper-outer quadrant of left female breast: Secondary | ICD-10-CM

## 2022-02-24 DIAGNOSIS — Z803 Family history of malignant neoplasm of breast: Secondary | ICD-10-CM | POA: Insufficient documentation

## 2022-02-24 DIAGNOSIS — Z90710 Acquired absence of both cervix and uterus: Secondary | ICD-10-CM | POA: Insufficient documentation

## 2022-02-24 NOTE — Telephone Encounter (Signed)
Received call from pt with complaint of ongoing left breast pain.  Pt states she continues to feel a lump in her left breast area.  Pt states pain is starting to affect her ADL's with working and would like to be seen for further evaluation.  [facility] visit scheduled, pt verbalized understanding of appt details.

## 2022-02-28 ENCOUNTER — Other Ambulatory Visit: Payer: Self-pay | Admitting: *Deleted

## 2022-02-28 ENCOUNTER — Ambulatory Visit: Payer: 59 | Attending: Hematology and Oncology

## 2022-02-28 DIAGNOSIS — R293 Abnormal posture: Secondary | ICD-10-CM | POA: Diagnosis present

## 2022-02-28 DIAGNOSIS — L599 Disorder of the skin and subcutaneous tissue related to radiation, unspecified: Secondary | ICD-10-CM | POA: Insufficient documentation

## 2022-02-28 DIAGNOSIS — M25612 Stiffness of left shoulder, not elsewhere classified: Secondary | ICD-10-CM | POA: Insufficient documentation

## 2022-02-28 DIAGNOSIS — C50412 Malignant neoplasm of upper-outer quadrant of left female breast: Secondary | ICD-10-CM | POA: Insufficient documentation

## 2022-02-28 DIAGNOSIS — Z170 Estrogen receptor positive status [ER+]: Secondary | ICD-10-CM | POA: Insufficient documentation

## 2022-02-28 NOTE — Progress Notes (Signed)
[facility] [facility] Follow up:    Dawn Rm, NP-C [name] [id number]   DIAGNOSIS:  Cancer Staging  Breast cancer of upper-outer quadrant of left female breast [name]) Staging form: Breast, AJCC 7th Edition - Clinical stage from 07/22/2014: Stage IA (T1c, N0, M0) - Unsigned Staged by: Pathologist and managing physician Laterality: Left Estrogen receptor status: Positive Progesterone receptor status: Positive HER2 status: Negative Stage used in treatment planning: Yes National guidelines used in treatment planning: Yes Type of national guideline used in treatment planning: NCCN - Pathologic: Stage IA (T1c, N0, cM0) - Unsigned Laterality: Left   SUMMARY OF ONCOLOGIC HISTORY: Oncology History  Breast cancer of upper-outer quadrant of left female breast ([name])  07/14/2014 Mammogram   Left breast UOQ 2:00 position: 1.4 x 1.1 x 1.2 cm mass with indistinct, spiculated margins. Right breast negative.   07/14/2014 Breast US   1.5 cm hypoechoic, irregular mass with spiculated margins UOQ left breast at 2:00, 15 cm from nipple. Intramammary LN 1.4 cm superior to the mass (thin cortex) with another IM LN 3 cm inferior to the index mass with mild focal cortical thickening    07/14/2014 Initial Biopsy   Left breast needle core biopsy (UOQ, 2:00 position): IDC with DCIS; grade 2, ER 100%, PR 96%, HER-2 negative ratio 1.17, Ki-67 17%;  Intramammary LN needle core biopsy (outer left breast, posterior 3:00 position): negative for malignancy.   07/24/2014 Breast MRI   Left breast: 1.4 x 1.4 x 1.3 cm enhancing mass with irregular borders at 3 o'clock position. Right breast with no abnormal enhancement. No abnormal appearing LNs.    07/29/2014 Procedure   Genetic counseling/testing: Breast/Ovarian cancer panel ([facility]) shows no clinically significant variants in ATM, BARD1, BRCA1, BRCA2, BRIP1, CDH1, CHEK2, EPCAM, FANCC, MLH1, MSH2, MSH6, NBN, PALB2, PMS2, PTEN,  RAD51C, RAD51D, STK11, TP53, and XRCC2.       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 Negative margins. ER+ (100%), PR+ (96%), HER-2 repeated and negative by FISH.  Ki-67 17%.   08/06/2014 Clinical Stage   Stage IA: T1c, N0 M0   08/06/2014 Oncotype testing   Recurrence score: 7 (6% ROR).  No chemo ([name]).    09/16/2014 - 11/09/2014 Radiation Therapy   Adjuvant RT completed [name]). Left breast/ 50.4 Gy at 1.8 Gy per fraction x 28 fractions.   Left breast boost/ 10 Gy at 2 Gy per fraction x 5 fractions   11/25/2014 - 01/08/2015 Anti-estrogen oral therapy   Anastrozole 1 mg daily stopped due to headaches     CURRENT THERAPY: observation  INTERVAL HISTORY: [name] 46 y.o. female returns for follow-up and evaluation due to some difficulty she has been having at work lately.  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  She has been experiencing left axillary pain and breast pain.  She is has a history of this discomfort and insurance only covered 10 visits with physical therapy.  She used 8 of those visits and then opted to save the last 2 in case she had more issues.  She notes her biggest challenge is the higher weights and pulling things off the shelf which makes her axilla worse.  She did bring in her job description in which her job title is returns  [name].  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  There are different scopes of the job function including unloading, checking, core [place], reflux, warrantee, special returns, battery specialist, or [place] alert, all areas.  The skills  required to include that she be able to stand and walk for 7 to 8 hours and sit 1 to 2 hours.  2 bends to push pull squat 3 to 4 hours climb 1 to 2 hours crouch 1 to 2 hours balancing 1 to 2 hours.  Weight carried/lifted are 1 to 2 hours is 41 to 60 pounds and 61 pounds and over as a team left.  26 to 40 pound is required to be lifted for 3 to 4 hours and then 0 to 25 pounds for 7 to 8 hours.  Repetitive actions such as the firm/strong grasping 5 to 6 hours is noted to in the physical requirements.  These will be scanned into the media tab for reference.   Patient Active Problem List   Diagnosis Date Noted   Abnormal radiologic finding of lung field 02/22/2022   Acute cough 02/22/2022   Right sided abdominal pain 02/20/2022   Dizziness 11/16/2021   Urinary incontinence 11/16/2021   Eye twitch 11/16/2021   History of seizures 11/16/2021   Mild intermittent asthma without complication 35/70/1779   Morbid obesity ([place]) 11/10/2021   Mild persistent asthma with acute exacerbation 11/10/2021   Productive cough 11/10/2021   Vitamin D deficiency 10/31/2021   Prediabetes 10/31/2021   History of breast cancer 10/23/2019   Anxiety 07/05/2015   Breast cancer of upper-outer quadrant of left female breast ([place]) 07/16/2014   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  MEDICAL HISTORY: Past Medical History:  Diagnosis Date   Asthma    no current med.   Breast cancer of upper-outer quadrant of left female breast ([place]) 07/16/2014   Heart murmur    as a child   History of seizures    during pregnancies; last seizure 3 yrs. ago   Personal history of radiation therapy 2016   Seizures ([facility])    no medications   Stress headaches     SURGICAL HISTORY: Past Surgical History:  Procedure Laterality Date   ABDOMINAL HYSTERECTOMY     BREAST BIOPSY Left 07/14/2014   x2   BREAST  BIOPSY Left 08/21/2016   BREAST LUMPECTOMY Left 08/06/2014   BREAST SURGERY     lumpectomy and lymph nodes   CESAREAN SECTION  [id number]   CESAREAN SECTION WITH BILATERAL TUBAL LIGATION  03/03/2003   CYSTO  12/20/2009   HYSTEROSCOPY W/ ENDOMETRIAL ABLATION  11/11/2007   LYSIS OF ADHESION  12/20/2009   TOTAL ABDOMINAL HYSTERECTOMY W/ BILATERAL SALPINGOOPHORECTOMY  12/20/2009   TYMPANOSTOMY TUBE PLACEMENT      SOCIAL HISTORY: Social History   Socioeconomic History   Marital status: Single    Spouse name: Not on file   Number of children: 3   Years of education: Not on file   Highest education level: Not on file  Occupational History   Not on file  Tobacco Use   Smoking status: Never   Smokeless tobacco: Never  Vaping Use   Vaping Use: Never used  Substance and Sexual Activity  Alcohol use: Not Currently    Comment: rarely   Drug use: No   Sexual activity: Yes    Birth control/protection: None  Other Topics Concern   Not on file  Social History Narrative   Not on file   Social Determinants of Health   Financial Resource Strain: Not on file  Food Insecurity: Not on file  Transportation Needs: Not on file  Physical Activity: Not on file  Stress: Not on file  Social Connections: Not on file  Intimate Partner Violence: Not on file    FAMILY HISTORY: Family History  Problem Relation Age of Onset   Hypertension Mother    Breast cancer Other        MGM's sister   Breast cancer Maternal Grandmother        dx in her 82s    Review of Systems  Constitutional:  Negative for appetite change, chills, fatigue, fever and unexpected weight change.  HENT:   Negative for hearing loss, lump/mass and trouble swallowing.   Eyes:  Negative for eye problems and icterus.  Respiratory:  Negative for chest tightness, cough and shortness of breath.   Cardiovascular:  Negative for chest pain, leg swelling and palpitations.  Gastrointestinal:  Negative for abdominal distention, abdominal  pain, constipation, diarrhea, nausea and vomiting.  Endocrine: Negative for hot flashes.  Genitourinary:  Negative for difficulty urinating.   Musculoskeletal:  Negative for arthralgias.  Skin:  Negative for itching and rash.  Neurological:  Negative for dizziness, extremity weakness, headaches and numbness.  Hematological:  Negative for adenopathy. Does not bruise/bleed easily.  Psychiatric/Behavioral:  Negative for depression. The patient is nervous/anxious.       PHYSICAL EXAMINATION  ECOG PERFORMANCE STATUS: 1 - Symptomatic but completely ambulatory  Vitals:   02/24/22 1350  BP: 120/72  Pulse: 67  Resp: 14  Temp: 97.8 F (36.6 C)  SpO2: 98%    Physical Exam Constitutional:      General: She is not in acute distress.    Appearance: Normal appearance. She is not toxic-appearing.  HENT:     Head: Normocephalic and atraumatic.  Eyes:     General: No scleral icterus. Cardiovascular:     Rate and Rhythm: Normal rate and regular rhythm.     Pulses: Normal pulses.     Heart sounds: Normal heart sounds.  Pulmonary:     Effort: Pulmonary effort is normal.     Breath sounds: Normal breath sounds.  Abdominal:     General: Abdomen is flat. Bowel sounds are normal. There is no distension.     Palpations: Abdomen is soft.     Tenderness: There is no abdominal tenderness.  Musculoskeletal:        General: No swelling.     Cervical back: Neck supple.  Lymphadenopathy:     Cervical: No cervical adenopathy.  Skin:    General: Skin is warm and dry.     Findings: No rash.  Neurological:     General: No focal deficit present.     Mental Status: She is alert.  Psychiatric:        Mood and Affect: Mood normal.        Behavior: Behavior normal.     LABORATORY DATA:  CBC    Component Value Date/Time   WBC 6.6 02/20/2022 1133   RBC 4.87 02/20/2022 1133   HGB 13.0 02/20/2022 1133   HGB 13.9 10/11/2021 1042   HGB 12.5 02/02/2015 1508   HCT 39.8 02/20/2022 1133  HCT  38.5 02/02/2015 1508   PLT 289.0 02/20/2022 1133   PLT 352 10/11/2021 1042   PLT 298 02/02/2015 1508   MCV 81.8 02/20/2022 1133   MCV 82.4 02/02/2015 1508   MCH 26.7 10/11/2021 1042   MCHC 32.6 02/20/2022 1133   RDW 15.4 02/20/2022 1133   RDW 14.5 02/02/2015 1508   LYMPHSABS 1.3 02/20/2022 1133   LYMPHSABS 1.2 02/02/2015 1508   MONOABS 0.4 02/20/2022 1133   MONOABS 0.5 02/02/2015 1508   EOSABS 0.1 02/20/2022 1133   EOSABS 0.1 02/02/2015 1508   BASOSABS 0.1 02/20/2022 1133   BASOSABS 0.0 02/02/2015 1508    CMP     Component Value Date/Time   NA 138 02/20/2022 1133   NA 141 02/02/2015 1508   K 3.9 02/20/2022 1133   K 3.7 02/02/2015 1508   CL 103 02/20/2022 1133   CO2 28 02/20/2022 1133   CO2 28 02/02/2015 1508   GLUCOSE 103 (H) 02/20/2022 1133   GLUCOSE 96 02/02/2015 1508   BUN 15 02/20/2022 1133   BUN 8.6 02/02/2015 1508   CREATININE 0.95 02/20/2022 1133   CREATININE 0.88 10/11/2021 1042   CREATININE 1.0 02/02/2015 1508   CALCIUM 9.0 02/20/2022 1133   CALCIUM 9.3 02/02/2015 1508   PROT 6.9 02/20/2022 1133   PROT 7.2 02/02/2015 1508   ALBUMIN 3.9 02/20/2022 1133   ALBUMIN 3.7 02/02/2015 1508   AST 14 02/20/2022 1133   AST 15 10/11/2021 1042   AST 21 02/02/2015 1508   ALT 14 02/20/2022 1133   ALT 14 10/11/2021 1042   ALT 19 02/02/2015 1508   ALKPHOS 94 02/20/2022 1133   ALKPHOS 102 02/02/2015 1508   BILITOT 0.3 02/20/2022 1133   BILITOT 0.3 10/11/2021 1042   BILITOT 0.28 02/02/2015 1508   GFRNONAA >60 10/11/2021 1042   GFRAA >60 03/28/2020 1744   GFRAA >60 04/11/2018 1223      ASSESSMENT and THERAPY PLAN:   Breast cancer of upper-outer quadrant of left female breast ([place]) [name] is a 46 year old woman who is here today for follow-up of difficulty she has had at work with some of the physical requirements.  The repetitive actions required for the job along with the lifting weights she is unable to do.  I recommend her lift and carry max be 40 pounds  and if they could reduce the firm strong grasping to 1 to 2 hours she would likely be able to work more effectively without pain.  She will bring [place] in some paperwork to complete for her and we will also send her to physical therapy to get their input as well.  She has follow-up with [place] in October which I recommended that she keep.   All questions were answered. The patient knows to call the clinic with any problems, questions or concerns. We can certainly see the patient much sooner if necessary. Total encounter time:20 minutes*in face-to-face visit time, chart review, lab review, care coordination, order entry, and documentation of the encounter time.    Wilber Bihari, NP 03/03/22 3:50 PM Medical Oncology and Hematology [facility] [place], [place] [address] Tel. [phone]    Fax. [phone]  *Total Encounter Time as defined by the Centers for Medicare and Medicaid Services includes, in addition to the face-to-face time of a patient visit (documented in the note above) non-face-to-face time: obtaining and reviewing outside history, ordering and reviewing medications, tests or procedures, care coordination (communications with other health care professionals or caregivers) and  documentation in the medical record.

## 2022-02-28 NOTE — Therapy (Signed)
OUTPATIENT PHYSICAL THERAPY ONCOLOGY RE-EVALUATION  Patient Name: [name] MRN: [id number] DOB:Oct 27, 1975, 46 y.o., female Today's Date: 02/28/2022   PT End of Session - 02/28/22 1107     Visit Number 9    Number of Visits 10    Date for PT Re-Evaluation 03/28/22    PT Start Time 1108    PT Stop Time 1202    PT Time Calculation (min) 54 min    Activity Tolerance Patient tolerated treatment well    Behavior During Therapy [facility] for tasks assessed/performed              Past Medical History:  Diagnosis Date   Asthma    no current med.   Breast cancer of upper-outer quadrant of left female breast ([place]) 07/16/2014   Heart murmur    as a child   History of seizures    during pregnancies; last seizure 3 yrs. ago   Personal history of radiation therapy 2016   Seizures ([place])    no medications   Stress headaches    Past Surgical History:  Procedure Laterality Date   ABDOMINAL HYSTERECTOMY     BREAST BIOPSY Left 07/14/2014   x2   BREAST BIOPSY Left 08/21/2016   BREAST LUMPECTOMY Left 08/06/2014   BREAST SURGERY     lumpectomy and lymph nodes   CESAREAN SECTION  2919,1660   CESAREAN SECTION WITH BILATERAL TUBAL LIGATION  03/03/2003   CYSTO  12/20/2009   HYSTEROSCOPY W/ ENDOMETRIAL ABLATION  11/11/2007   LYSIS OF ADHESION  12/20/2009   TOTAL ABDOMINAL HYSTERECTOMY W/ BILATERAL SALPINGOOPHORECTOMY  12/20/2009   TYMPANOSTOMY TUBE PLACEMENT     Patient Active Problem List   Diagnosis Date Noted   Abnormal radiologic finding of lung field 02/22/2022   Acute cough 02/22/2022   Right sided abdominal pain 02/20/2022   Dizziness 11/16/2021   Urinary incontinence 11/16/2021   Eye twitch 11/16/2021   History of seizures 11/16/2021   Mild intermittent asthma without complication 60/09/5995   Morbid obesity ([place]) 11/10/2021   Mild persistent asthma with acute exacerbation 11/10/2021   Productive cough 11/10/2021   Vitamin D deficiency 10/31/2021   Prediabetes 10/31/2021    History of breast cancer 10/23/2019   Anxiety 07/05/2015   Breast cancer of upper-outer quadrant of left female breast ([place]) 07/16/2014   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 12/20/2009   SEIZURE DISORDER 07/07/2009   DEPRESSION, RECURRENT 08/24/2008    PCP: [name], NP-C  REFERRING PROVIDER: Gardenia Phlegm, NP  REFERRING DIAG: C50.412,Z17.0 (ICD-10-CM) - Malignant neoplasm of upper-outer quadrant of left breast in female, estrogen receptor positive ([place])   THERAPY DIAG:  Stiffness of left shoulder, not elsewhere classified  Disorder of the skin and subcutaneous tissue related to radiation, unspecified  Abnormal posture  Malignant neoplasm of upper-outer quadrant of left breast in female, estrogen receptor positive ([place])  ONSET DATE: 08/24/2021  Rationale for Evaluation and Treatment Rehabilitation  SUBJECTIVE  SUBJECTIVE STATEMENT:   I saw Wilber Bihari and she wanted me to come back and see you. When I am at work I have trouble lifting and pulling and the more I do it it hurts under my left arm in the axillary region. The doctor had me on a 30 lb wt restriction but I was lifting 40-50 lbs. at work.  It feels really tight . I do some self massage there and some of the exercises I was doing here. It takes about 2 hours after I do the exercises and take Aleve or Tylenol for pain to come down. I have been out of work for 4 days now until I get my work restrictions laid out properly. Pts surgery was in 2016 and she was able to perform her work activities then at a different employer.  She presently works for [employer] and was doing fine until she started a new job requiring heavier lifting of up to 50lbs.   PERTINENT HISTORY:  Diagnosed with left invasive breast  cancer on 07/13/14 which is ER/PR positive and HER2 negative. Ki67 is 17%. Underwent L br lumpectomy and SLNB on 08/06/14 (0/2)  PAIN:  Are you having pain? Yes NPRS scale: 5-6/10 Pain location: lateral left trunk inferior to axillary region Pain orientation: Left  PAIN TYPE: dull at rest, sharp with movement Pain description: constant  Aggravating factors: wearing a bra - top of bra rubs it, lifting, reaching to put items on a conveyor belt Relieving factors: Tyleno, Aleve halp a little  PRECAUTIONS: Other: at risk of lymphedema  WEIGHT BEARING RESTRICTIONS No  FALLS:  Has patient fallen in last 6 months? 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  LEISURE: walks daily for an hour  HAND DOMINANCE : right   PRIOR LEVEL OF FUNCTION: Independent  PATIENT GOALS for her arm to quit hurting   OBJECTIVE  COGNITION:  Overall cognitive status: Within functional limits for tasks assessed   PALPATION: Increased tenderness and tightness along axillary border and sternal border of pec and along serratus and left UT  POSTURE: forward head, rounded shoulders  UPPER EXTREMITY AROM/PROM:  A/PROM RIGHT   eval   Shoulder extension 51  Shoulder flexion 166  Shoulder abduction 172  Shoulder internal rotation 48  Shoulder external rotation 85    (Blank rows = not tested)  A/PROM LEFT   eval Left 12/26/21 L 12/29/21 L 02/28/22  Shoulder extension 58   45 tight under arm  Shoulder flexion 166  168 105 pulls  Shoulder abduction 154 148 (pt just got off work) 165 95 pulls  Shoulder internal rotation 45   40 pulls ant shoulder  Shoulder external rotation 84   75 pulls anterior shoulder    (Blank rows = not tested)     UPPER EXTREMITY STRENGTH: grossly  5/5   LYMPHEDEMA ASSESSMENTS:   SURGERY TYPE/DATE: L breast lumpectomy and SLNB on 08/06/14  NUMBER OF LYMPH NODES REMOVED: 0/2  CHEMOTHERAPY: none  RADIATION: completed 11/09/14  HORMONE TREATMENT: stopped due to headaches in 2016  INFECTIONS: none  LYMPHEDEMA ASSESSMENTS:   LANDMARK RIGHT  eval Right Re-eval  10 cm proximal to olecranon process 40 41.5  Olecranon process 29.5 29.4  10 cm proximal to ulnar styloid process 24.2 25.8  Just proximal to ulnar styloid process 17.2 16.9  Across hand at thumb web space 20 20.0  At base of 2nd digit 6.9 6.8  (Blank rows = not tested)  LANDMARK LEFT  eval Left 02/28/2022 RE_EVAL  10 cm proximal to olecranon process 39.7 42.3  Olecranon process 30 29  10  cm proximal to ulnar styloid process 24.4 25.0  Just proximal to ulnar styloid process 16.7 17.0  Across hand at thumb web space 19.9 19.8  At base of 2nd digit 6.5 6.7  (Blank rows = not tested)    QUICK DASH SURVEY:   [name] - 02/28/22 0001     Open a tight or new jar Moderate difficulty    Do heavy household chores (wash walls, wash floors) Moderate difficulty    Carry a shopping bag or briefcase Mild difficulty    Wash your back Severe difficulty    Use a knife to cut food No difficulty    Recreational activities in which you take some force or impact through your arm, shoulder, or hand (golf, hammering, tennis) Severe difficulty    During the past week, to what extent has your arm, shoulder or hand problem interfered with your normal social activities with family, friends, neighbors, or groups? Quite a bit    During the past week, to what extent has your arm, shoulder or hand problem limited your work or other regular daily activities Quite a bit    Arm, shoulder, or hand pain. Moderate    Tingling (pins and needles) in your arm, shoulder, or hand Severe    Difficulty Sleeping Moderate difficulty    DASH Score 54.55 %           54.55  02/28/2022     TODAY'S TREATMENT   02/28/2022 RE-EVAL Educated in stretches to improve  Left shoulder ROM and decrease tightness. Gave pt handout for 4 post op exercises and gave pt stargazer, sitting scapular retraction to stretch chest, in addition to standing counter stretch, pectoralis wall stretch in 2 positions, and supine wand flexion and scaption.  Had pt try each exercise and reminded pt to relax her left arm and breathe through the exercises to try and relax as she does stretches. Advised pt to try and massage axillary border of [place] while doing stargazer stretch in supine to try and get the muscle to relax more.    12/29/21 PROM to L shoulder in to flexion, abduction and ER with prolonged holds with increased tightness noted at first but by end of session pt had nearly full PROM STM to serratus anterior and axilla in supine to decrease tightness and improve comfort  12/26/21 STM using cocoa butter in supine and R sidelying to L lateral trunk in area of serratus,lats and lateral edge of pec, where there is increased tightness and fibrosis Gentle PROM for left shoulder flexion, scaption abduction, ER.  Pulleys x 2 min in to flex and abduction with pt feeling a good stretch at end range Ball up wall x 10 reps in to flexion (bilaterally) and 10 reps in to abduction (L only) with pt feeling strong stretch at end range  12/22/2021  STM using cocoa butter in supine and R sidelying to L lateral trunk in area of serratus,lats and lateral edge of pec, where there is increased tightness and fibrosis Gentle PROM for left shoulder flexion, scaption abduction, ER.   Instructed pt in supine scapular strengthening series: narrow and wide grip flexion, horizontal abduction, diagonals and ER all  with yellow theraband x 10 reps each. Issued handout for pt to add to HEP to help improve strength and stretch.   12/16/2021  STM using cocoa butter in supine and R sidelying to L lateral trunk in area of  serratus,lats and lateral edge of pec, where there is increased tightness and fibrosis Gentle PROM for left shoulder flexion, scaption abduction, ER.   12/15/2021  STM using cocoa butter in supine and R sidelying to L lateral trunk in area of serratus,lats and lateral edge of pec, UT where there is increased tightness/fibrosis. Gentle PROM for left shoulder flexion, scaption abduction, ER. VC's to get pt to relax LTR with arms outstretched  x 5 ea Supine wand flexion and scaption x 5   PATIENT EDUCATION:  Access Code: [email or web address] URL: [email or web address] Date: 02/28/2022 Prepared by: Cheral Almas  Exercises - Standing 'L' Stretch at Counter  - 2 x daily - 7 x weekly - 5 reps - 5-10 hold - Supine Shoulder Flexion with Dowel  - 2 x daily - 7 x weekly - 1 sets - 5 reps - Single Arm Doorway Pec Stretch at 90 Degrees Abduction  - 1 x daily - 7 x weekly - 1 sets - 5 reps - 10 hold - Doorway Pec Stretch at 60 Degrees Abduction with Arm Straight  - 1 x daily - 7 x weekly - 1 sets - 5 reps Education details: Access Code: [email or web address] URL: [email or web address] Date: 12/08/2021 Prepared by: Cheral Almas  Exercises - Supine Lower Trunk Rotation  - 2 x daily - 7 x weekly - 1 sets - 5 reps - 10 hold - Supine Shoulder Flexion with Dowel  - 2 x daily - 7 x weekly - 1 sets - 5 reps - 10 hold - Standing Shoulder Abduction Slides at Wall  - 2 x daily - 7 x weekly - 1 sets - 5 reps - 10 hold - Single Arm Doorway Pec Stretch at 90 Degrees Abduction  - 2 x daily - 7 x weekly - 1 sets - 3 reps - 10-20 hold Person educated: Patient Education method: Explanation Education comprehension: verbalized understanding   HOME EXERCISE PROGRAM: Continue self massage to area of tightness, LTR with arms outstretched and goal post, supine flexion and scaption with dowel, abduction wall slides, single arm pec stretch in doorway  ASSESSMENT:Pt was initially discharged on 12/29/2021 secondary  to pt wanting to save her last 2 visits.  She had made good progress with improvements in ROM and decreased pain.She has been working at a job requiring heavy repetitive lifting and has regressed with her shoulder ROM, and pain levels.She is still having tightness in her axilla  and significant limitations in AROM dues to pain/tightness in the pectoral/inferior axillary region. She has difficulty relaxing to stretch. Her HEP has been updated today and pt was advised not to overstretch to the point of pain but to work on relaxing and gently stretching to discomfort only. She would like to meet with her provider at the [facility] before scheduling her last appointment. Quick Deliah Boston reveals nearly 55% limitation in UE function  OBJECTIVE IMPAIRMENTS decreased ROM, decreased strength, increased fascial restrictions, impaired UE functional use, postural dysfunction, and pain.   ACTIVITY LIMITATIONS none   PARTICIPATION LIMITATIONS:  none  PERSONAL FACTORS  none  are also affecting patient's functional outcome.   REHAB POTENTIAL: Excellent  CLINICAL DECISION MAKING: Stable/uncomplicated  EVALUATION COMPLEXITY: Low  GOALS: Goals reviewed with patient? Yes  SHORT TERM GOALS = LONG  TERM GOALS  LONG TERM GOALS: Target date: 03/28/2022    Pt will report no pain in left lateral trunk or inferior axilla to allow her to work without increased pain. Baseline: 7/10; 12/29/21- 3/10, 02/28/2022 5-6/10 Goal status: NOT MET , carry over  2.  Pt will demonstrate 170 degrees of left shoulder abduction to allow her to reach out to the side.  Baseline: 154; 12/29/21- 145, after stretching 165 Goal status: MET/ NOT met at re-eval 02/28/2022 (carry over)  3.  Pt will be independent in a home exercise program for continued stretching and strengthening.  Baseline: 12/29/21- pt is independent with these Goal status: MET, HEP updated and pt will work on these at home 02/28/2022   PLAN: PT FREQUENCY: 1 more visit due to  insurance ends  PT DURATION:  4 weeks  PLANNED INTERVENTIONS: Therapeutic exercises, Patient/Family education, Joint mobilization, Manual lymph drainage, scar mobilization, and Manual therapy  PLAN FOR NEXT SESSION: Soft tissue mobilizatioj, PROM, retry pulleys, update HEP prn. 1 visit left until end of insurance   Claris Pong, PT 02/28/2022, 12:25 PM

## 2022-03-02 ENCOUNTER — Telehealth: Payer: Self-pay

## 2022-03-02 NOTE — Telephone Encounter (Signed)
S/w pt regarding corrections being made to FMLA pertaining to job description and requirements. Per NP, pt should not work longer than 6-8 hr shift and should lift no more than 30 lbs, as well as no constant reaching as to preserve function of her LUE and prevention of lymphedema. Clarified information with pt and submitted paperwork to new FMLA folder to be reviewed and completed by our [facility] nurses. Pt made aware to allow 10 business days for completion. Pt asks to be called when FMLA ppw is complete. She knows to call for any further questions or concerns in the interim.

## 2022-03-03 NOTE — Assessment & Plan Note (Signed)
[name] is a 46 year old woman who is here today for follow-up of difficulty she has had at work with some of the physical requirements.  The repetitive actions required for the job along with the lifting weights she is unable to do.  I recommend her lift and carry max be 40 pounds and if they could reduce the firm strong grasping to 1 to 2 hours she would likely be able to work more effectively without pain.  She will bring [place] in some paperwork to complete for her and we will also send her to physical therapy to get their input as well.  She has follow-up with [place] in October which I recommended that she keep.

## 2022-03-06 ENCOUNTER — Telehealth: Payer: Self-pay | Admitting: Family Medicine

## 2022-03-06 NOTE — Telephone Encounter (Signed)
Would you like pt to have an appt for the antibiotic? Pt is still not feeling better.

## 2022-03-06 NOTE — Telephone Encounter (Signed)
Appt scheduled for tomorrow 9/12

## 2022-03-06 NOTE — Telephone Encounter (Signed)
Patient called and states that [name] told her at last appointment that if she continued to have problems she would call in abx. Patient wants to know of abx can be sent in or if she needs appt. Return call at [phone]

## 2022-03-07 ENCOUNTER — Ambulatory Visit (INDEPENDENT_AMBULATORY_CARE_PROVIDER_SITE_OTHER): Payer: 59 | Admitting: Family Medicine

## 2022-03-07 ENCOUNTER — Encounter: Payer: Self-pay | Admitting: Family Medicine

## 2022-03-07 VITALS — BP 126/84 | HR 57 | Temp 98.0°F | Ht 63.0 in | Wt 271.0 lb

## 2022-03-07 DIAGNOSIS — R058 Other specified cough: Secondary | ICD-10-CM

## 2022-03-07 DIAGNOSIS — R203 Hyperesthesia: Secondary | ICD-10-CM

## 2022-03-07 DIAGNOSIS — J029 Acute pharyngitis, unspecified: Secondary | ICD-10-CM | POA: Diagnosis not present

## 2022-03-07 DIAGNOSIS — R519 Headache, unspecified: Secondary | ICD-10-CM

## 2022-03-07 DIAGNOSIS — Z8619 Personal history of other infectious and parasitic diseases: Secondary | ICD-10-CM

## 2022-03-07 LAB — POC COVID19 BINAXNOW: SARS Coronavirus 2 Ag: NEGATIVE

## 2022-03-07 MED ORDER — AMOXICILLIN 875 MG PO TABS: 875.0000 mg | ORAL_TABLET | Freq: Two times a day (BID) | ORAL | 0 refills | Status: AC

## 2022-03-07 MED ORDER — VALACYCLOVIR HCL 1 G PO TABS: 1000.0000 mg | ORAL_TABLET | Freq: Three times a day (TID) | ORAL | 0 refills | Status: AC

## 2022-03-07 NOTE — Assessment & Plan Note (Signed)
Discussed symptomatic treatment.  Negative Covid test.

## 2022-03-07 NOTE — Patient Instructions (Signed)
Take the antibiotic and antiviral medications as prescribed.   Follow up if worsening or not back to baseline in 2-3 weeks.

## 2022-03-07 NOTE — Progress Notes (Signed)
Subjective:     Patient ID: [name], female    DOB: 07/05/1975, 46 y.o.   MRN: [id number]  Chief Complaint  Patient presents with   Follow-up    Not feeling any better, pain in stomach has gone away but now is having burning feeling on her right sided hip, neck and face. Also mentions her right eye hurts as well.     HPI Patient is in today for new symptoms. Right side of her face and right sided neck pian described as burning for the past 24 hours. States she felt like this before when she had shingles and she denies having a rash then. Scratchy throat x 3 days. She also had a headache 3 days ago which has resolved.   Reports consistent cough with some clear sputum 2 1/2 weeks-3 weeks.   Denies fever, chills, dizziness, rhinorrhea, nasal congestion, sinus pressure, post nasal drainage, chest pain, palpitations, shortness of breath, abdominal pain, N/V/D, urinary symptoms, LE edema.    Health Maintenance Due  Topic Date Due   COLONOSCOPY (Pts 45-55yr Insurance coverage will need to be confirmed)  Never done    Past Medical History:  Diagnosis Date   Asthma    no current med.   [place]   Heart murmur    as a child   History of seizures    during pregnancies; last seizure 3 yrs. ago   Personal history of radiation therapy 2016   [place]    no medications   Stress headaches     Past Surgical History:  Procedure Laterality Date   ABDOMINAL HYSTERECTOMY     BREAST BIOPSY Left 07/14/2014   x2   BREAST BIOPSY Left 08/21/2016   BREAST LUMPECTOMY Left 08/06/2014   BREAST SURGERY     lumpectomy and lymph nodes   CESAREAN SECTION  18250,0370  CESAREAN SECTION WITH BILATERAL TUBAL LIGATION  03/03/2003   CYSTO  12/20/2009   HYSTEROSCOPY W/ ENDOMETRIAL ABLATION  11/11/2007   LYSIS OF ADHESION  12/20/2009   TOTAL ABDOMINAL HYSTERECTOMY W/ BILATERAL SALPINGOOPHORECTOMY  12/20/2009   TYMPANOSTOMY  TUBE PLACEMENT      Family History  Problem Relation Age of Onset   Hypertension Mother    Breast cancer Other        MGM's sister   Breast cancer Maternal Grandmother        dx in her 450s   Social History   Socioeconomic History   Marital status: Single    Spouse name: Not on file   Number of children: 3   Years of education: Not on file   Highest education level: Not on file  Occupational History   Not on file  Tobacco Use   Smoking status: Never   Smokeless tobacco: Never  Vaping Use   Vaping Use: Never used  Substance and Sexual Activity   Alcohol use: Not Currently    Comment: rarely   Drug use: No   Sexual activity: Yes    Birth control/protection: None  Other Topics Concern   Not on file  Social History Narrative   Not on file   Social Determinants of Health   Financial Resource Strain: Not on file  Food Insecurity: Not on file  Transportation Needs: Not on file  Physical Activity: Not on file  Stress: Not on file  Social Connections: Not on file  Intimate Partner Violence: Not on file  Outpatient Medications Prior to Visit  Medication Sig Dispense Refill   albuterol (VENTOLIN HFA) 108 (90 Base) MCG/ACT inhaler Inhale 1-2 puffs into the lungs every 6 (six) hours as needed for wheezing or shortness of breath. 1 each 0   fluticasone (FLOVENT HFA) 110 MCG/ACT inhaler Inhale 2 puffs into the lungs in the morning and at bedtime. 1 each 1   meloxicam (MOBIC) 15 MG tablet Take 1 tablet (15 mg total) by mouth daily. 30 tablet 0   naproxen sodium (ALEVE) 220 MG tablet Take 220 mg by mouth as needed (pain).     No facility-administered medications prior to visit.    No Known Allergies  ROS     Objective:    Physical Exam Constitutional:      General: She is not in acute distress.    Appearance: She is not ill-appearing.  HENT:     Head:     Jaw: Pain on movement present.     Comments: Hyperesthesia to right cheek, no rash or erythema     Right  Ear: Ear canal normal.     Left Ear: Tympanic membrane and ear canal normal.     Ears:     Comments: Clear fluid behind left TM with scarring     Nose: Nose normal.     Mouth/Throat:     Mouth: Mucous membranes are moist.     Pharynx: Oropharynx is clear.  Eyes:     Extraocular Movements: Extraocular movements intact.     Conjunctiva/sclera: Conjunctivae normal.     Pupils: Pupils are equal, round, and reactive to light.  Neck:     Comments: Right anterior cervical TTP without any appreciable enlarged node  Cardiovascular:     Rate and Rhythm: Normal rate.  Pulmonary:     Effort: Pulmonary effort is normal.     Breath sounds: Normal breath sounds.  Musculoskeletal:     Cervical back: Full passive range of motion without pain, normal range of motion and neck supple. Tenderness present.  Neurological:     Mental Status: She is alert.     BP 126/84 (BP Location: Right Arm, Patient Position: Sitting, Cuff Size: Large)   Pulse (!) 57   Temp 98 F (36.7 C) (Temporal)   Ht '5\' 3"'$  (1.6 m)   Wt 271 lb (122.9 kg)   SpO2 99%   BMI 48.01 kg/m  Wt Readings from Last 3 Encounters:  03/07/22 271 lb (122.9 kg)  02/24/22 274 lb 12.8 oz (124.6 kg)  02/22/22 273 lb (123.8 kg)       Assessment & Plan:   Problem List Items Addressed This Visit       Respiratory   Acute pharyngitis    Discussed symptomatic treatment.  Negative Covid test.       Relevant Orders   POC COVID-19 (Completed)     Other   Cough present for greater than 3 weeks - Primary    Amoxicillin prescribed. Continue asthma treatment       Relevant Medications   amoxicillin (AMOXIL) 875 MG tablet   Right-sided face pain    Will treat with Valtrex for possible shingles with her hx of the same. She is going out of town today.  Discussed benefit is greater than risk  Follow up if not improving or if worsening.       Relevant Medications   valACYclovir (VALTREX) 1000 MG tablet   Other Visit Diagnoses      Hyperesthesia  History of shingles           I am having [name]. [name] start on amoxicillin and valACYclovir. I am also having her maintain her naproxen sodium, fluticasone, albuterol, and meloxicam.  Meds ordered this encounter  Medications   amoxicillin (AMOXIL) 875 MG tablet    Sig: Take 1 tablet (875 mg total) by mouth 2 (two) times daily for 10 days.    Dispense:  20 tablet    Refill:  0    Order Specific Question:   Supervising Provider    Answer:   Pricilla Holm A [[id number]]   valACYclovir (VALTREX) 1000 MG tablet    Sig: Take 1 tablet (1,000 mg total) by mouth 3 (three) times daily for 7 days.    Dispense:  21 tablet    Refill:  0    Order Specific Question:   Supervising Provider    Answer:   Pricilla Holm A [[id number]]

## 2022-03-07 NOTE — Assessment & Plan Note (Signed)
Amoxicillin prescribed. Continue asthma treatment

## 2022-03-07 NOTE — Assessment & Plan Note (Addendum)
Will treat with Valtrex for possible shingles with her hx of the same. She is going out of town today.  Discussed benefit is greater than risk  Follow up if not improving or if worsening.

## 2022-03-24 NOTE — Telephone Encounter (Signed)
Called pt to talk to them about CT scan result not being release pt stated that she already got the result

## 2022-04-05 ENCOUNTER — Ambulatory Visit (INDEPENDENT_AMBULATORY_CARE_PROVIDER_SITE_OTHER): Payer: 59 | Admitting: Family Medicine

## 2022-04-05 ENCOUNTER — Encounter: Payer: Self-pay | Admitting: Family Medicine

## 2022-04-05 ENCOUNTER — Ambulatory Visit (INDEPENDENT_AMBULATORY_CARE_PROVIDER_SITE_OTHER): Payer: 59

## 2022-04-05 VITALS — BP 118/80 | HR 60 | Temp 97.6°F | Ht 63.0 in | Wt 272.0 lb

## 2022-04-05 DIAGNOSIS — F419 Anxiety disorder, unspecified: Secondary | ICD-10-CM | POA: Diagnosis not present

## 2022-04-05 DIAGNOSIS — G8929 Other chronic pain: Secondary | ICD-10-CM | POA: Diagnosis not present

## 2022-04-05 DIAGNOSIS — M79604 Pain in right leg: Secondary | ICD-10-CM

## 2022-04-05 DIAGNOSIS — M545 Low back pain, unspecified: Secondary | ICD-10-CM | POA: Diagnosis not present

## 2022-04-05 DIAGNOSIS — M79605 Pain in left leg: Secondary | ICD-10-CM

## 2022-04-05 DIAGNOSIS — F32A Depression, unspecified: Secondary | ICD-10-CM

## 2022-04-05 MED ORDER — MELOXICAM 15 MG PO TABS: 15.0000 mg | ORAL_TABLET | Freq: Every day | ORAL | 0 refills | Status: DC

## 2022-04-05 NOTE — Progress Notes (Signed)
Subjective:     Patient ID: Sharen Hint, female    DOB: [birth year], 45 y.o.   MRN: [id number]  Chief Complaint  Patient presents with   Leg Pain    Both legs has been giving her pain and achiness for a while, states when she sits for about 10 mins its like her legs "go to sleep"  when she stands up and throughout the day it hurts like she has been on her feet all day. States she is unable to sleep due to the pain, has even tried putting pillow in between her legs for comfort which hasnt helped.    Leg Pain    Patient is in today for a several week hx of bilateral leg pain worse x 3 wks. Bayer back and body helps with her pain. States the front of her thighs and lower legs feel like they are bruised, sore.  Numbness in her anterior legs after sitting long periods but resolved when standing up and walking. Pain is not worse with walking.  No weakness.  No urinary symptoms or changes in bowel habits.   Nausea which is not new.   Denies fever, chills, dizziness, chest pain, palpitations, shortness of breath, abdominal pain, V/D, urinary symptoms, LE edema.   Hx of breast cancer.    Health Maintenance Due  Topic Date Due   COLONOSCOPY (Pts 45-72yr Insurance coverage will need to be confirmed)  Never done    Past Medical History:  Diagnosis Date   Asthma    no current med.   [place]ast [place] of [place]outer quadrant [place] breast [place]   Heart murmur    as a child   History of seizures    during pregnancies; last seizure 3 yrs. ago   Personal history of radiation therapy 2016   [place]    no medications   Stress headaches     Past Surgical History:  Procedure Laterality Date   ABDOMINAL HYSTERECTOMY     BREAST BIOPSY Left 07/14/2014   x2   BREAST BIOPSY Left 08/21/2016   BREAST LUMPECTOMY Left 08/06/2014   BREAST SURGERY     lumpectomy and lymph nodes   CESAREAN SECTION  16948,5462  CESAREAN SECTION WITH BILATERAL TUBAL LIGATION   03/03/2003   CYSTO  12/20/2009   HYSTEROSCOPY W/ ENDOMETRIAL ABLATION  11/11/2007   LYSIS OF ADHESION  12/20/2009   TOTAL ABDOMINAL HYSTERECTOMY W/ BILATERAL SALPINGOOPHORECTOMY  12/20/2009   TYMPANOSTOMY TUBE PLACEMENT      Family History  Problem Relation Age of Onset   Hypertension Mother    Breast cancer Other        MGM's sister   Breast cancer Maternal Grandmother        dx in her 454s   Social History   Socioeconomic History   Marital status: Single    Spouse name: Not on file   Number of children: 3   Years of education: Not on file   Highest education level: Not on file  Occupational History   Not on file  Tobacco Use   Smoking status: Never   Smokeless tobacco: Never  Vaping Use   Vaping Use: Never used  Substance and Sexual Activity   Alcohol use: Not Currently    Comment: rarely   Drug use: No   Sexual activity: Yes    Birth control/protection: None  Other Topics Concern   Not on file  Social History Narrative   Not on file  Social Determinants of Health   Financial Resource Strain: Not on file  Food Insecurity: Not on file  Transportation Needs: Not on file  Physical Activity: Not on file  Stress: Not on file  Social Connections: Not on file  Intimate Partner Violence: Not on file    Outpatient Medications Prior to Visit  Medication Sig Dispense Refill   albuterol (VENTOLIN HFA) 108 (90 Base) MCG/ACT inhaler Inhale 1-2 puffs into the lungs every 6 (six) hours as needed for wheezing or shortness of breath. 1 each 0   fluticasone (FLOVENT HFA) 110 MCG/ACT inhaler Inhale 2 puffs into the lungs in the morning and at bedtime. 1 each 1   naproxen sodium (ALEVE) 220 MG tablet Take 220 mg by mouth as needed (pain).     meloxicam (MOBIC) 15 MG tablet Take 1 tablet (15 mg total) by mouth daily. 30 tablet 0   No facility-administered medications prior to visit.    No Known Allergies  ROS     Objective:    Physical Exam Constitutional:       General: She is not in acute distress.    Appearance: She is ill-appearing.  HENT:     Mouth/Throat:     Mouth: Mucous membranes are moist.  Eyes:     Conjunctiva/sclera: Conjunctivae normal.  Cardiovascular:     Rate and Rhythm: Normal rate.  Pulmonary:     Effort: Pulmonary effort is normal.  Abdominal:     Palpations: Abdomen is soft.     Tenderness: There is no right CVA tenderness, left CVA tenderness or guarding.  Musculoskeletal:     Cervical back: Normal range of motion and neck supple.     Thoracic back: Normal.     Lumbar back: Tenderness present. Decreased range of motion. Positive right straight leg raise test and positive left straight leg raise test.     Comments: Bilateral LE are neurovascularly intact.   Skin:    General: Skin is warm and dry.     Capillary Refill: Capillary refill takes less than 2 seconds.  Neurological:     General: No focal deficit present.     Mental Status: She is alert and oriented to person, place, and time.     Cranial Nerves: No cranial nerve deficit.     Sensory: No sensory deficit.     Motor: No weakness.     Coordination: Coordination normal.     Gait: Gait normal.     Deep Tendon Reflexes: Reflexes normal.  Psychiatric:        Mood and Affect: Mood normal.        Behavior: Behavior normal.        Thought Content: Thought content normal.     BP 118/80 (BP Location: Right Arm, Patient Position: Sitting, Cuff Size: Large)   Pulse 60   Temp 97.6 F (36.4 C) (Temporal)   Ht '5\' 3"'$  (1.6 m)   Wt 272 lb (123.4 kg)   SpO2 98%   BMI 48.18 kg/m  Wt Readings from Last 3 Encounters:  04/05/22 272 lb (123.4 kg)  03/07/22 271 lb (122.9 kg)  02/24/22 274 lb 12.8 oz (124.6 kg)       Assessment & Plan:   Problem List Items Addressed This Visit       Other   Bilateral leg pain   Relevant Orders   DG Lumbar Spine Complete (Completed)   Ambulatory referral to Orthopedic Surgery   Chronic midline low back pain without sciatica -  Primary   Relevant Medications   meloxicam (MOBIC) 15 MG tablet   Other Relevant Orders   DG Lumbar Spine Complete (Completed)   Ambulatory referral to Orthopedic Surgery   Other Visit Diagnoses     Anxiety and depression       Relevant Orders   Ambulatory referral to Psychology      No sign of infectious process. No red flag symptoms.  Suspect LE pain may be related to lumbar spine pain.  XR Lumbar spine ordered.  Counseling on pain management.  Referral to Orthopedist for further evaluation and treatment.  If worsening she will go to the ED.   She brought up long hx of anxiety and depression at the end of the visit. I will refer her to psych for further evaluation and treatment. Recommend counseling and she may need medication.  I am having Lowella Dell. Bansal maintain her naproxen sodium, fluticasone, albuterol, and meloxicam.  Meds ordered this encounter  Medications   meloxicam (MOBIC) 15 MG tablet    Sig: Take 1 tablet (15 mg total) by mouth daily.    Dispense:  30 tablet    Refill:  0    Order Specific Question:   Supervising Provider    Answer:   Pricilla Holm A [[id number]]

## 2022-04-05 NOTE — Patient Instructions (Addendum)
Please go downstairs for an x-ray of your low back before you leave today.  I refilled the meloxicam which is an anti-inflammatory.  Avoid taking any other over-the-counter pain medications except for Tylenol.  You may take Tylenol 1000 mg twice daily.  Continue using heating pad or an ice pack.  You can also use topical pain medicine.  I have referred you to Ortho care and they will call you to schedule a visit.  You can call to schedule your appointment with a therapist.   A few offices are listed below for you to call.      [facility] [address] [place], [place] [address]  Phone: [facility]  [address], [place], [place] [address]  Phone: [phone]

## 2022-04-10 ENCOUNTER — Telehealth: Payer: Self-pay | Admitting: Hematology and Oncology

## 2022-04-10 ENCOUNTER — Ambulatory Visit: Payer: 59 | Admitting: Orthopedic Surgery

## 2022-04-10 NOTE — Telephone Encounter (Signed)
Scheduled appointment per provider [facility]. Patient is aware of the changes made to her upcoming appointment. 

## 2022-04-19 ENCOUNTER — Ambulatory Visit: Payer: 59 | Admitting: Hematology and Oncology

## 2022-04-22 NOTE — Progress Notes (Signed)
Patient Care Team: [name], NP-C as PCP - General (Family Medicine) Nicholas Lose, MD as Consulting Physician (Hematology and Oncology)  DIAGNOSIS: No diagnosis found.  SUMMARY OF ONCOLOGIC HISTORY: Oncology History  Breast cancer of upper-outer quadrant of left female breast ([place])  07/14/2014 Mammogram   Left breast UOQ 2:00 position: 1.4 x 1.1 x 1.2 cm mass with indistinct, spiculated margins. Right breast negative.   07/14/2014 Breast US   1.5 cm hypoechoic, irregular mass with spiculated margins UOQ left breast at 2:00, 15 cm from nipple. Intramammary LN 1.4 cm superior to the mass (thin cortex) with another IM LN 3 cm inferior to the index mass with mild focal cortical thickening    07/14/2014 Initial Biopsy   Left breast needle core biopsy (UOQ, 2:00 position): IDC with DCIS; grade 2, ER 100%, PR 96%, HER-2 negative ratio 1.17, Ki-67 17%;  Intramammary LN needle core biopsy (outer left breast, posterior 3:00 position): negative for malignancy.   07/24/2014 Breast MRI   Left breast: 1.4 x 1.4 x 1.3 cm enhancing mass with irregular borders at 3 o'clock position. Right breast with no abnormal enhancement. No abnormal appearing LNs.    07/29/2014 Procedure   Genetic counseling/testing: Breast/Ovarian cancer panel ([facility]) shows no clinically significant variants in ATM, BARD1, BRCA1, BRCA2, BRIP1, CDH1, CHEK2, EPCAM, FANCC, MLH1, MSH2, MSH6, NBN, PALB2, PMS2, PTEN, RAD51C, RAD51D, STK11, TP53, and XRCC2.       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 Negative margins. ER+ (100%), PR+ (96%), HER-2 repeated and negative by FISH.  Ki-67 17%.   08/06/2014 Clinical Stage   Stage IA: T1c, N0 M0   08/06/2014 Oncotype testing   Recurrence score: 7 (6% ROR).  No chemo ([place]).    09/16/2014 - 11/09/2014 Radiation Therapy   Adjuvant RT completed Pablo Ledger). Left  breast/ 50.4 Gy at 1.8 Gy per fraction x 28 fractions.   Left breast boost/ 10 Gy at 2 Gy per fraction x 5 fractions   11/25/2014 - 01/08/2015 Anti-estrogen oral therapy   Anastrozole 1 mg daily stopped due to headaches     CHIEF COMPLIANT: Surveillance of breast cancer   INTERVAL HISTORY: [name] is a 46 y.o. with above-mentioned history of left breast cancer treated with lumpectomy, radiation, and who could not tolerate anastrozole. She is currently on surveillance. Mammogram on 12/13/2020 showed no evidence of malignancy. She presents to the clinic today for follow-up.    ALLERGIES:  has No Known Allergies.  MEDICATIONS:  Current Outpatient Medications  Medication Sig Dispense Refill   albuterol (VENTOLIN HFA) 108 (90 Base) MCG/ACT inhaler Inhale 1-2 puffs into the lungs every 6 (six) hours as needed for wheezing or shortness of breath. 1 each 0   fluticasone (FLOVENT HFA) 110 MCG/ACT inhaler Inhale 2 puffs into the lungs in the morning and at bedtime. 1 each 1   meloxicam (MOBIC) 15 MG tablet Take 1 tablet (15 mg total) by mouth daily. 30 tablet 0   naproxen sodium (ALEVE) 220 MG tablet Take 220 mg by mouth as needed (pain).     No current facility-administered medications for this visit.    PHYSICAL EXAMINATION: ECOG PERFORMANCE STATUS: {CHL ONC ECOG [phone]}  There were no vitals filed for this visit. There were no vitals filed for this visit.  BREAST:*** No palpable masses or nodules in either right or left breasts. No palpable axillary supraclavicular  or infraclavicular adenopathy no breast tenderness or nipple discharge. (exam performed in the presence of a chaperone)  LABORATORY DATA:  I have reviewed the data as listed    Latest Ref Rng & Units 02/20/2022   11:33 AM 10/11/2021   10:42 AM 03/28/2020    5:44 PM  CMP  Glucose 70 - 99 mg/dL 103  87  94   BUN 6 - 23 mg/dL _0 Creatinine 0.40 - 1.20 mg/dL 0.95  0.88  0.94   Sodium 135 - 145 mEq/L  138  138  138   Potassium 3.5 - 5.1 mEq/L 3.9  4.5  4.0   Chloride 96 - 112 mEq/L 103  104  103   CO2 19 - 32 mEq/L _1 Calcium 8.4 - 10.5 mg/dL 9.0  9.3  9.4   Total Protein 6.0 - 8.3 g/dL 6.9  7.5    Total Bilirubin 0.2 - 1.2 mg/dL 0.3  0.3    Alkaline Phos 39 - 117 U/L 94  91    AST 0 - 37 U/L 14  15    ALT 0 - 35 U/L 14  14      Lab Results  Component Value Date   WBC 6.6 02/20/2022   HGB 13.0 02/20/2022   HCT 39.8 02/20/2022   MCV 81.8 02/20/2022   PLT 289.0 02/20/2022   NEUTROABS 4.7 02/20/2022    ASSESSMENT & PLAN:  No problem-specific Assessment & Plan notes found for this encounter.    No orders of the defined types were placed in this encounter.  The patient has a good understanding of the overall plan. she agrees with it. she will call with any problems that may develop before the next visit here. Total time spent: 30 mins including face to face time and time spent for planning, charting and co-ordination of care   Suzzette Righter, [place] 04/22/22    I Gardiner Coins am scribing for Dr. Lindi Adie  ***

## 2022-04-27 ENCOUNTER — Inpatient Hospital Stay: Payer: 59 | Attending: Adult Health | Admitting: Hematology and Oncology

## 2022-04-27 ENCOUNTER — Other Ambulatory Visit: Payer: Self-pay

## 2022-04-27 VITALS — BP 133/75 | HR 76 | Temp 97.5°F | Resp 18 | Ht 63.0 in | Wt 270.7 lb

## 2022-04-27 DIAGNOSIS — C50412 Malignant neoplasm of upper-outer quadrant of left female breast: Secondary | ICD-10-CM

## 2022-04-27 DIAGNOSIS — Z170 Estrogen receptor positive status [ER+]: Secondary | ICD-10-CM | POA: Diagnosis not present

## 2022-04-27 DIAGNOSIS — R531 Weakness: Secondary | ICD-10-CM | POA: Diagnosis not present

## 2022-04-27 DIAGNOSIS — Z853 Personal history of malignant neoplasm of breast: Secondary | ICD-10-CM | POA: Insufficient documentation

## 2022-04-27 DIAGNOSIS — R5383 Other fatigue: Secondary | ICD-10-CM | POA: Diagnosis not present

## 2022-04-27 NOTE — Assessment & Plan Note (Addendum)
Left breast lumpectomy: Invasive ductal carcinoma with DCIS, no LVI, 2 sentinel nodes negative, 1.3 cm, grade 1, 100%, PR 96 wasn't, HER-2 negative Ki-67 17% T1 cN0 M0 stage IA Oncotype DX recurrence score is 7, 6% ROR status post radiation completed May 2016, patient had hysterectomy with bilateral salpingo-oophorectomy 2011)  Anastrozole 1 mg daily started June 2016 stopped July 2016 due to severe headaches   Current treatment: Unable to tolerate antiestrogen therapy. Severe anxiety: PCP taking care. Shes contemplating on seeing a physicatrist Severe fatigue which is chronic   Breast cancer surveillance: 1.  Breast exam 04/27/2022: Benign 2. mammogram left breast 10/06/2021 no evidence of malignancy breast density category B 3.  Breast MRI 11/09/2021: Benign Based on low breast density she does not need routine breast MRIs.   Left arm weakness: With severe pain and discomfort and stiffness.  She unfortunately lost her job because of this.   Return to clinic on an as-needed basis

## 2022-06-26 ENCOUNTER — Telehealth: Payer: 59 | Admitting: Family Medicine

## 2022-06-26 DIAGNOSIS — U071 COVID-19: Secondary | ICD-10-CM

## 2022-06-26 NOTE — Progress Notes (Signed)
[place]   Convert to VV

## 2022-10-04 ENCOUNTER — Other Ambulatory Visit: Payer: Self-pay | Admitting: Hematology and Oncology

## 2022-10-04 DIAGNOSIS — Z170 Estrogen receptor positive status [ER+]: Secondary | ICD-10-CM

## 2022-10-09 ENCOUNTER — Ambulatory Visit
Admission: RE | Admit: 2022-10-09 | Discharge: 2022-10-09 | Disposition: A | Discharge status: Home / Self Care | Payer: 59 | Source: Ambulatory Visit | Attending: Hematology and Oncology | Admitting: Hematology and Oncology

## 2022-10-09 DIAGNOSIS — C50412 Malignant neoplasm of upper-outer quadrant of left female breast: Secondary | ICD-10-CM

## 2022-10-17 ENCOUNTER — Ambulatory Visit (INDEPENDENT_AMBULATORY_CARE_PROVIDER_SITE_OTHER): Payer: Medicaid Other | Admitting: Family Medicine

## 2022-10-17 ENCOUNTER — Other Ambulatory Visit: Payer: Self-pay | Admitting: Family Medicine

## 2022-10-17 VITALS — BP 120/86 | HR 60 | Temp 97.6°F | Ht 63.0 in | Wt 277.0 lb

## 2022-10-17 DIAGNOSIS — R7303 Prediabetes: Secondary | ICD-10-CM

## 2022-10-17 DIAGNOSIS — J4520 Mild intermittent asthma, uncomplicated: Secondary | ICD-10-CM

## 2022-10-17 DIAGNOSIS — F32A Depression, unspecified: Secondary | ICD-10-CM | POA: Diagnosis not present

## 2022-10-17 DIAGNOSIS — E559 Vitamin D deficiency, unspecified: Secondary | ICD-10-CM

## 2022-10-17 DIAGNOSIS — F419 Anxiety disorder, unspecified: Secondary | ICD-10-CM | POA: Diagnosis not present

## 2022-10-17 LAB — COMPREHENSIVE METABOLIC PANEL
ALT: 15 U/L (ref 0–35)
AST: 18 U/L (ref 0–37)
Albumin: 4.1 g/dL (ref 3.5–5.2)
Alkaline Phosphatase: 89 U/L (ref 39–117)
BUN: 13 mg/dL (ref 6–23)
CO2: 31 mEq/L (ref 19–32)
Calcium: 9.5 mg/dL (ref 8.4–10.5)
Chloride: 102 mEq/L (ref 96–112)
Creatinine, Ser: 0.94 mg/dL (ref 0.40–1.20)
GFR: 72.98 mL/min (ref >60.00)
Glucose, Bld: 86 mg/dL (ref 70–99)
Potassium: 4.1 mEq/L (ref 3.5–5.1)
Sodium: 138 mEq/L (ref 135–145)
Total Bilirubin: 0.4 mg/dL (ref 0.2–1.2)
Total Protein: 7.4 g/dL (ref 6.0–8.3)

## 2022-10-17 LAB — CBC WITH DIFFERENTIAL/PLATELET
Basophils Absolute: 0 10*3/uL (ref 0.0–0.1)
Basophils Relative: 0.9 % (ref 0.0–3.0)
Eosinophils Absolute: 0.1 10*3/uL (ref 0.0–0.7)
Eosinophils Relative: 1.4 % (ref 0.0–5.0)
HCT: 43.6 % (ref 36.0–46.0)
Hemoglobin: 14.2 g/dL (ref 12.0–15.0)
Lymphocytes Relative: 19.5 % (ref 12.0–46.0)
Lymphs Abs: 1.1 10*3/uL (ref 0.7–4.0)
MCHC: 32.4 g/dL (ref 30.0–36.0)
MCV: 83.6 fl (ref 78.0–100.0)
Monocytes Absolute: 0.4 10*3/uL (ref 0.1–1.0)
Monocytes Relative: 6.6 % (ref 3.0–12.0)
Neutro Abs: 4 10*3/uL (ref 1.4–7.7)
Neutrophils Relative %: 71.6 % (ref 43.0–77.0)
Platelets: 318 10*3/uL (ref 150.0–400.0)
RBC: 5.22 Mil/uL — ABNORMAL HIGH (ref 3.87–5.11)
RDW: 16 % — ABNORMAL HIGH (ref 11.5–15.5)
WBC: 5.6 10*3/uL (ref 4.0–10.5)

## 2022-10-17 LAB — VITAMIN D 25 HYDROXY (VIT D DEFICIENCY, FRACTURES): VITD: 14.65 ng/mL — ABNORMAL LOW (ref 30.00–100.00)

## 2022-10-17 LAB — T4, FREE: Free T4: 0.88 ng/dL (ref 0.60–1.60)

## 2022-10-17 LAB — HEMOGLOBIN A1C: Hgb A1c MFr Bld: 6 % (ref 4.6–6.5)

## 2022-10-17 LAB — TSH: TSH: 1.29 u[IU]/mL (ref 0.35–5.50)

## 2022-10-17 MED ORDER — VITAMIN D (ERGOCALCIFEROL) 1.25 MG (50000 UNIT) PO CAPS
50000.0000 [IU] | ORAL_CAPSULE | ORAL | 2 refills | Status: DC
Start: 2022-10-17 — End: 2023-06-18

## 2022-10-17 MED ORDER — ESCITALOPRAM OXALATE 5 MG PO TABS: 5.0000 mg | ORAL_TABLET | Freq: Every day | ORAL | 1 refills | Status: DC

## 2022-10-17 NOTE — Assessment & Plan Note (Signed)
Check vitamin D level and follow up 

## 2022-10-17 NOTE — Patient Instructions (Signed)
Please go downstairs for labs.   Start Lexapro daily.   Call and schedule a therapy visit. You can schedule with anyone you would like and may chest the list below.   [email or web address]  [email or web address]   You should hear from [facility].

## 2022-10-17 NOTE — Assessment & Plan Note (Signed)
Controlled. Recommend treating allergies and avoiding triggers. Use albuterol prn

## 2022-10-17 NOTE — Progress Notes (Signed)
Subjective:     Patient ID: [name], female    DOB: 08-24-1975, 47 y.o.   MRN: [id number]  Chief Complaint  Patient presents with   Depression    HPI Patient is in today with c/o worsening anxiety and depression. No SI.  Reports increased stress in her life regarding health, work and finances.   Would like to start on medication. Recalls taking medication for mood several years ago.   States her chest feels tight when she is stressed. Her right shoulder also hurts at times.  States she has needed her albuterol inhaler at times but asthma fairly controlled. Allergies are an issue.   States her oncologist took her out of work due to shoulder pain.   States she has been on [employer] for 8 months.  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 Filed for disability.   Weight is a concern. She has been trying to lose weight. States she has a poor appetite and does not overeat.    Health Maintenance Due  Topic Date Due   COLONOSCOPY (Pts 45-58yrs Insurance coverage will need to be confirmed)  Never done    Past Medical History:  Diagnosis Date   Asthma    no current med.   Breast cancer of upper-outer quadrant of left female breast 07/16/2014   Heart murmur    as a child   History of seizures    during pregnancies; last seizure 3 yrs. ago   Personal history of radiation therapy 2016   Seizures    no medications   Stress headaches     Past Surgical History:  Procedure Laterality Date   ABDOMINAL HYSTERECTOMY     BREAST BIOPSY Left 07/14/2014   x2   BREAST BIOPSY Left 08/21/2016   BREAST LUMPECTOMY Left 08/06/2014   BREAST SURGERY     lumpectomy and lymph nodes   CESAREAN SECTION  4098,1191   CESAREAN SECTION WITH BILATERAL TUBAL LIGATION  03/03/2003   CYSTO  12/20/2009   HYSTEROSCOPY W/ ENDOMETRIAL ABLATION  11/11/2007   LYSIS OF ADHESION  12/20/2009   TOTAL ABDOMINAL HYSTERECTOMY W/ BILATERAL SALPINGOOPHORECTOMY  12/20/2009    TYMPANOSTOMY TUBE PLACEMENT      Family History  Problem Relation Age of Onset   Hypertension Mother    Breast cancer Other        MGM's sister   Breast cancer Maternal Grandmother        dx in her 40s    Social History   Socioeconomic History   Marital status: Single    Spouse name: Not on file   Number of children: 3   Years of education: Not on file   Highest education level: 10th grade  Occupational History   Not on file  Tobacco Use   Smoking status: Never   Smokeless tobacco: Never  Vaping Use   Vaping Use: Never used  Substance and Sexual Activity   Alcohol use: Not Currently    Comment: rarely   Drug use: No   Sexual activity: Yes    Birth control/protection: None  Other Topics Concern   Not on file  Social History Narrative   Not on file   Social Determinants of Health   Financial Resource Strain: Medium Risk (10/17/2022)   Overall Financial Resource Strain (CARDIA)    Difficulty of Paying Living Expenses: Somewhat hard  Food Insecurity: Food Insecurity Present (10/17/2022)   Hunger Vital Sign    Worried  About Running Out of Food in the Last Year: Sometimes true    Ran Out of Food in the Last Year: Never true  Transportation Needs: No Transportation Needs (10/17/2022)   PRAPARE - Administrator, Civil Service (Medical): No    Lack of Transportation (Non-Medical): No  Physical Activity: Unknown (10/17/2022)   Exercise Vital Sign    Days of Exercise per Week: Patient declined    Minutes of Exercise per Session: Not on file  Stress: Stress Concern Present (10/17/2022)   [employer] of Occupational Health - Occupational Stress Questionnaire    Feeling of Stress : Very much  Social Connections: Socially Isolated (10/17/2022)   Social Connection and Isolation Panel [NHANES]    Frequency of Communication with Friends and Family: Once a week    Frequency of Social Gatherings with Friends and Family: Once a week    Attends Religious Services:  Never    Database administrator or Organizations: No    Attends Engineer, structural: Not on file    Marital Status: Living with partner  Intimate Partner Violence: Not on file    Outpatient Medications Prior to Visit  Medication Sig Dispense Refill   naproxen sodium (ALEVE) 220 MG tablet Take 220 mg by mouth as needed (pain).     albuterol (VENTOLIN HFA) 108 (90 Base) MCG/ACT inhaler Inhale 1-2 puffs into the lungs every 6 (six) hours as needed for wheezing or shortness of breath. (Patient not taking: Reported on 10/17/2022) 1 each 0   fluticasone (FLOVENT HFA) 110 MCG/ACT inhaler Inhale 2 puffs into the lungs in the morning and at bedtime. (Patient not taking: Reported on 10/17/2022) 1 each 1   meloxicam (MOBIC) 15 MG tablet Take 1 tablet (15 mg total) by mouth daily. (Patient not taking: Reported on 10/17/2022) 30 tablet 0   No facility-administered medications prior to visit.    No Known Allergies  Review of Systems  Constitutional:  Negative for chills, fever and weight loss.  Respiratory:  Negative for shortness of breath.   Cardiovascular:  Negative for chest pain, palpitations and leg swelling.  Gastrointestinal:  Negative for abdominal pain, constipation, diarrhea, nausea and vomiting.  Genitourinary:  Negative for dysuria, frequency and urgency.  Musculoskeletal:  Positive for joint pain.  Neurological:  Negative for dizziness and focal weakness.  Psychiatric/Behavioral:  Positive for depression. Negative for substance abuse and suicidal ideas. The patient is nervous/anxious.        Objective:    Physical Exam Constitutional:      General: She is not in acute distress.    Appearance: She is obese. She is not ill-appearing.  HENT:     Mouth/Throat:     Mouth: Mucous membranes are moist.  Eyes:     Extraocular Movements: Extraocular movements intact.     Conjunctiva/sclera: Conjunctivae normal.  Cardiovascular:     Rate and Rhythm: Normal rate and regular  rhythm.  Pulmonary:     Effort: Pulmonary effort is normal.     Breath sounds: Normal breath sounds.  Musculoskeletal:     Cervical back: Normal range of motion and neck supple.  Skin:    General: Skin is warm and dry.  Neurological:     General: No focal deficit present.     Mental Status: She is alert and oriented to person, place, and time.  Psychiatric:        Attention and Perception: Attention normal.        Mood and  Affect: Mood is depressed.        Speech: Speech normal.        Behavior: Behavior normal.        Thought Content: Thought content normal.     BP 120/86 (BP Location: Right Arm, Patient Position: Sitting, Cuff Size: Large)   Pulse 60   Temp 97.6 F (36.4 C) (Temporal)   Ht  (1.6 m)   Wt 277 lb (125.6 kg)   SpO2 98%   BMI 49.07 kg/m  Wt Readings from Last 3 Encounters:  10/17/22 277 lb (125.6 kg)  04/27/22 270 lb 11.2 oz (122.8 kg)  04/05/22 272 lb (123.4 kg)       Assessment & Plan:   Problem List Items Addressed This Visit       Respiratory   Mild intermittent asthma without complication    Controlled. Recommend treating allergies and avoiding triggers. Use albuterol prn        Other   Anxiety and depression - Primary    Hx of same. Worsening recently due to stressors including health, work and finances. Denies SI.  Recommend counseling. Start Lexapro 5 mg daily. Follow up in 4 wks or sooner if needed.       Relevant Medications   escitalopram (LEXAPRO) 5 MG tablet   Other Relevant Orders   TSH (Completed)   T4, free (Completed)   Morbid obesity    Recommend healthy diet. Discussed that GLP-1 medications work to decrease appetite and she denies having a good appetite and denies overeating. She is amenable to a referral to [facility].  Check labs to look for complications related to obesity.       Relevant Orders   Amb Ref to Medical Weight Management   CBC with Differential/Platelet (Completed)   Comprehensive metabolic panel  (Completed)   TSH (Completed)   T4, free (Completed)   Hemoglobin A1c (Completed)   Prediabetes    Recheck A1c and follow up.       Relevant Orders   CBC with Differential/Platelet (Completed)   Comprehensive metabolic panel (Completed)   TSH (Completed)   T4, free (Completed)   Hemoglobin A1c (Completed)   Vitamin D deficiency    Check vitamin D level and follow up      Relevant Orders   VITAMIN D 25 Hydroxy (Vit-D Deficiency, Fractures) (Completed)    I have discontinued Kevin M. Coppin's fluticasone, albuterol, and meloxicam. I am also having her start on escitalopram. Additionally, I am having her maintain her naproxen sodium.  Meds ordered this encounter  Medications   escitalopram (LEXAPRO) 5 MG tablet    Sig: Take 1 tablet (5 mg total) by mouth at bedtime.    Dispense:  30 tablet    Refill:  1    Order Specific Question:   Supervising Provider    Answer:   Hillard Danker A [[id number]]

## 2022-10-17 NOTE — Assessment & Plan Note (Addendum)
Recommend healthy diet. Discussed that GLP-1 medications work to decrease appetite and she denies having a good appetite and denies overeating. She is amenable to a referral to [facility].  Check labs to look for complications related to obesity.

## 2022-10-17 NOTE — Assessment & Plan Note (Signed)
Hx of same. Worsening recently due to stressors including health, work and finances. Denies SI.  Recommend counseling. Start Lexapro 5 mg daily. Follow up in 4 wks or sooner if needed.

## 2022-10-17 NOTE — Assessment & Plan Note (Signed)
Recheck A1c and follow up.

## 2022-10-27 ENCOUNTER — Telehealth: Payer: Self-pay

## 2022-10-27 ENCOUNTER — Other Ambulatory Visit: Payer: Self-pay | Admitting: Family Medicine

## 2022-10-27 MED ORDER — ONDANSETRON 4 MG PO TBDP: 4.0000 mg | ORAL_TABLET | Freq: Three times a day (TID) | ORAL | 0 refills | Status: DC | PRN

## 2022-10-27 NOTE — Telephone Encounter (Signed)
Pt called regarding x4 days of nausea, diarrhea and abdominal pain. Pt thinks it may be due to starting new medications, Lexapro and Vitamin D.   Spoke with PCP and she advised this is most likely due to the lexapro, she advised pt to take 1/2 tablet of lexapro x5 days at bedtime and with food and see if her symptoms resolve and then move back up to the full tablet.   I told pt to please let us know if symptoms still do not resolve. Pt verbalized understanding.

## 2022-11-07 ENCOUNTER — Telehealth: Payer: Medicaid Other | Admitting: Nurse Practitioner

## 2022-11-07 DIAGNOSIS — B001 Herpesviral vesicular dermatitis: Secondary | ICD-10-CM

## 2022-11-07 MED ORDER — ACYCLOVIR 800 MG PO TABS
800.0000 mg | ORAL_TABLET | Freq: Two times a day (BID) | ORAL | 0 refills | Status: AC
Start: 2022-11-07 — End: 2022-11-14

## 2022-11-07 NOTE — Progress Notes (Signed)
We are sorry that you are not feeling well.  Here is how we plan to help!  Based on what you have shared with me it does look like you have a viral infection.    Most cold sores or fever blisters are small fluid filled blisters around the mouth caused by herpes simplex virus.  The most common strain of the virus causing cold sores is herpes simplex virus 1.  It can be spread by skin contact, sharing eating utensils, or even sharing towels.  Cold sores are contagious to other people until dry. (Approximately 5-7 days).  Wash your hands. You can spread the virus to your eyes through handling your contact lenses after touching the lesions.  Most people experience pain at the sight or tingling sensations in their lips that may begin before the ulcers erupt.  Herpes simplex is treatable but not curable.  It may lie dormant for a long time and then reappear due to stress or prolonged sun exposure.  Many patients have success in treating their cold sores with an over the counter topical called Abreva.  You may apply the cream up to 5 times daily (maximum 10 days) until healing occurs.  If you would like to use an oral antiviral medication to speed the healing of your cold sore, I have sent a prescription to your local pharmacy Acyclovir 800 mg take one by mouth twice a day for 7 days    HOME CARE:  Wash your hands frequently. Do not pick at or rub the sore. Don't open the blisters. Avoid kissing other people during this time. Avoid sharing drinking glasses, eating utensils, or razors. Do not handle contact lenses unless you have thoroughly washed your hands with soap and warm water! Avoid oral sex during this time.  Herpes from sores on your mouth can spread to your partner's genital area. Avoid contact with anyone who has eczema or a weakened immune system. Cold sores are often triggered by exposure to intense sunlight, use a lip balm containing a sunscreen (SPF 30 or higher).  GET HELP RIGHT AWAY  IF:  Blisters look infected. Blisters occur near or in the eye. Symptoms last longer than 10 days. Your symptoms become worse.  MAKE SURE YOU:  Understand these instructions. Will watch your condition. Will get help right away if you are not doing well or get worse.    Your e-visit answers were reviewed by a board certified advanced clinical practitioner to complete your personal care plan.  Depending upon the condition, your plan could have  Included both over the counter or prescription medications.    Please review your pharmacy choice.  Be sure that the pharmacy you have chosen is open so that you can pick up your prescription now.  If there is a problem you can message your provider in MyChart to have the prescription routed to another pharmacy.    Your safety is important to us.  If you have drug allergies check our prescription carefully.  For the next 24 hours you can use MyChart to ask questions about today's visit, request a non-urgent call back, or ask for a work or school excuse from your e-visit provider.  You will get an email in the next two days asking about your experience.  I hope that your e-visit has been valuable and will speed your recovery.   Meds ordered this encounter  Medications   acyclovir (ZOVIRAX) 800 MG tablet    Sig: Take 1 tablet (800 mg   total) by mouth 2 (two) times daily for 7 days.    Dispense:  14 tablet    Refill:  0    I spent approximately 5 minutes reviewing the patient's history, current symptoms and coordinating their care today.   

## 2022-11-27 ENCOUNTER — Encounter (INDEPENDENT_AMBULATORY_CARE_PROVIDER_SITE_OTHER): Payer: Medicaid Other | Admitting: Physician Assistant

## 2022-12-13 ENCOUNTER — Encounter: Payer: Self-pay | Admitting: Family Medicine

## 2022-12-13 ENCOUNTER — Ambulatory Visit (INDEPENDENT_AMBULATORY_CARE_PROVIDER_SITE_OTHER): Payer: Medicaid Other

## 2022-12-13 ENCOUNTER — Ambulatory Visit: Payer: Medicaid Other | Admitting: Family Medicine

## 2022-12-13 VITALS — BP 124/82 | HR 76 | Temp 98.1°F | Ht 63.0 in | Wt 275.4 lb

## 2022-12-13 DIAGNOSIS — F32A Depression, unspecified: Secondary | ICD-10-CM

## 2022-12-13 DIAGNOSIS — F515 Nightmare disorder: Secondary | ICD-10-CM | POA: Diagnosis not present

## 2022-12-13 DIAGNOSIS — F419 Anxiety disorder, unspecified: Secondary | ICD-10-CM | POA: Diagnosis not present

## 2022-12-13 DIAGNOSIS — R058 Other specified cough: Secondary | ICD-10-CM | POA: Diagnosis not present

## 2022-12-13 DIAGNOSIS — Z7722 Contact with and (suspected) exposure to environmental tobacco smoke (acute) (chronic): Secondary | ICD-10-CM | POA: Diagnosis not present

## 2022-12-13 DIAGNOSIS — R059 Cough, unspecified: Secondary | ICD-10-CM | POA: Diagnosis not present

## 2022-12-13 NOTE — Patient Instructions (Signed)
Please go downstairs for chest x-ray before you leave.  We will be in touch with your results.  Once your prescription vitamin D is finished, start taking over-the-counter vitamin D3 1000 IUs daily or you can take a multivitamin if you prefer  Please call and schedule with a therapist   [email or web address]   [email or web address]   [facility] Multiple locations [phone]     [facility] [address] [place], [place] [address]  Phone: [phone]   [facility]  [address], [place], [place] [address]  Phone: [phone]

## 2022-12-13 NOTE — Progress Notes (Unsigned)
Subjective:     Patient ID: [name], female    DOB: [birth year], 47 y.o.   MRN: [id number]  Chief Complaint  Patient presents with   Medical Management of Chronic Issues    Pt states here for medication management mainly the anxiety and depression meds been causing her to have bad dreams and coughing all day long sometime feeling like there is a brick on her chest. Pt also states having issues finding the right allergy medication. Pt also mentions that when she wakes up every morning she finds little spots of blood around her mouth and pillow.      HPI  Discussed the use of AI scribe software for clinical note transcription with the patient, who gave verbal consent to proceed.  History of Present Illness         Here to f/u on anxiety and depression. Started on Lexapro 5 mg in April. She has been feeling less depressed.  Stopped the medication 3 days ago due to new onset of nightmares that started 2 weeks ago.  States her nightmares stopped after stopping Lexapro.   States she has been waking up with a spot of blood on her pillow for the past 6 weeks. She denies sores on tongue or inside her mouth.  Hx of seizures. Denies having seizure activity and her husband has been observing her.   States she saw her dentist   Cough x 4 wks.   Passive smoke exposure during childhood.     Health Maintenance Due  Topic Date Due   COVID-19 Vaccine (1) Never done   Colonoscopy  Never done    Past Medical History:  Diagnosis Date   Asthma    no current med.   Breast cancer of upper-outer quadrant of left female breast (HCC) 07/16/2014   Heart murmur    as a child   History of seizures    during pregnancies; last seizure 3 yrs. ago   Personal history of radiation therapy 2016   Seizures (HCC)    no medications   Stress headaches     Past Surgical History:  Procedure Laterality Date   ABDOMINAL HYSTERECTOMY     BREAST BIOPSY Left 07/14/2014   x2   BREAST BIOPSY Left  08/21/2016   BREAST LUMPECTOMY Left 08/06/2014   BREAST SURGERY     lumpectomy and lymph nodes   CESAREAN SECTION  4098,1191   CESAREAN SECTION WITH BILATERAL TUBAL LIGATION  03/03/2003   CYSTO  12/20/2009   HYSTEROSCOPY W/ ENDOMETRIAL ABLATION  11/11/2007   LYSIS OF ADHESION  12/20/2009   TOTAL ABDOMINAL HYSTERECTOMY W/ BILATERAL SALPINGOOPHORECTOMY  12/20/2009   TYMPANOSTOMY TUBE PLACEMENT      Family History  Problem Relation Age of Onset   Hypertension Mother    Breast cancer Other        MGM's sister   Breast cancer Maternal Grandmother        dx in her 71s    Social History   Socioeconomic History   Marital status: Single    Spouse name: Not on file   Number of children: 3   Years of education: Not on file   Highest education level: 10th grade  Occupational History   Not on file  Tobacco Use   Smoking status: Never   Smokeless tobacco: Never  Vaping Use   Vaping Use: Never used  Substance and Sexual Activity   Alcohol use: Not Currently    Comment: rarely   Drug  use: No   Sexual activity: Yes    Birth control/protection: None  Other Topics Concern   Not on file  Social History Narrative   Not on file   Social Determinants of Health   Financial Resource Strain: Medium Risk (10/17/2022)   Overall Financial Resource Strain (CARDIA)    Difficulty of Paying Living Expenses: Somewhat hard  Food Insecurity: Food Insecurity Present (10/17/2022)   Hunger Vital Sign    Worried About Running Out of Food in the Last Year: Sometimes true    Ran Out of Food in the Last Year: Never true  Transportation Needs: No Transportation Needs (10/17/2022)   PRAPARE - Administrator, Civil Service (Medical): No    Lack of Transportation (Non-Medical): No  Physical Activity: Unknown (10/17/2022)   Exercise Vital Sign    Days of Exercise per Week: Patient declined    Minutes of Exercise per Session: Not on file  Stress: Stress Concern Present (10/17/2022)   [employer] of Occupational Health - Occupational Stress Questionnaire    Feeling of Stress : Very much  Social Connections: Socially Isolated (10/17/2022)   Social Connection and Isolation Panel [NHANES]    Frequency of Communication with Friends and Family: Once a week    Frequency of Social Gatherings with Friends and Family: Once a week    Attends Religious Services: Never    Database administrator or Organizations: No    Attends Engineer, structural: Not on file    Marital Status: Living with partner  Intimate Partner Violence: Not on file    Outpatient Medications Prior to Visit  Medication Sig Dispense Refill   naproxen sodium (ALEVE) 220 MG tablet Take 220 mg by mouth as needed (pain).     ondansetron (ZOFRAN-ODT) 4 MG disintegrating tablet Take 1 tablet (4 mg total) by mouth every 8 (eight) hours as needed for nausea or vomiting. 20 tablet 0   Vitamin D, Ergocalciferol, (DRISDOL) 1.25 MG (50000 UNIT) CAPS capsule Take 1 capsule (50,000 Units total) by mouth every 7 (seven) days. 4 capsule 2   escitalopram (LEXAPRO) 5 MG tablet Take 1 tablet (5 mg total) by mouth at bedtime. 30 tablet 1   No facility-administered medications prior to visit.    No Known Allergies  Review of Systems  Constitutional:  Negative for chills and fever.  Respiratory:  Positive for cough. Negative for hemoptysis and shortness of breath.   Cardiovascular:  Negative for chest pain and palpitations.  Gastrointestinal:  Negative for abdominal pain, constipation, diarrhea, nausea and vomiting.  Genitourinary:  Negative for dysuria, frequency and urgency.  Neurological:  Negative for dizziness and focal weakness.  Psychiatric/Behavioral:  Positive for depression. Negative for suicidal ideas. The patient is nervous/anxious and has insomnia.        Objective:    Physical Exam Constitutional:      General: She is not in acute distress.    Appearance: She is not ill-appearing.  HENT:      Mouth/Throat:     Mouth: Mucous membranes are moist.     Pharynx: Oropharynx is clear.  Eyes:     Extraocular Movements: Extraocular movements intact.     Conjunctiva/sclera: Conjunctivae normal.  Cardiovascular:     Rate and Rhythm: Normal rate and regular rhythm.  Pulmonary:     Effort: Pulmonary effort is normal.     Breath sounds: Normal breath sounds.  Musculoskeletal:     Cervical back: Normal range of motion and neck  supple.  Skin:    General: Skin is warm and dry.  Neurological:     General: No focal deficit present.     Mental Status: She is alert and oriented to person, place, and time.  Psychiatric:        Mood and Affect: Mood normal.        Behavior: Behavior normal.        Thought Content: Thought content normal.      BP 124/82 (BP Location: Right Arm, Patient Position: Sitting, Cuff Size: Normal)   Pulse 76   Temp 98.1 F (36.7 C) (Oral)   Ht 5\' 3"  (1.6 m)   Wt 275 lb 6.4 oz (124.9 kg)   SpO2 97%   BMI 48.78 kg/m  Wt Readings from Last 3 Encounters:  12/13/22 275 lb 6.4 oz (124.9 kg)  10/17/22 277 lb (125.6 kg)  04/27/22 270 lb 11.2 oz (122.8 kg)       Assessment & Plan:   Problem List Items Addressed This Visit       Nervous and Auditory   Nightmares   Relevant Medications   buPROPion (WELLBUTRIN XL) 150 MG 24 hr tablet     Other   Anxiety and depression - Primary   Relevant Medications   buPROPion (WELLBUTRIN XL) 150 MG 24 hr tablet   Cough present for greater than 3 weeks   Relevant Orders   DG Chest 2 View   Passive smoke exposure   She has stopped Lexapro due to nightmares. Nightmares resolved.  She cannot recall previous medication she took years ago.  I will start her on low dose Wellbutrin.  Chest X ray ordered for cough. Exam benign.  Follow up if not improving or pending X ray.  She will need a f/u for anxiety and depression in approx 1-2 months.   I have discontinued Elenore Paddy. Grillo's escitalopram. I am also having her  start on buPROPion. Additionally, I am having her maintain her naproxen sodium, Vitamin D (Ergocalciferol), and ondansetron.  Meds ordered this encounter  Medications   buPROPion (WELLBUTRIN XL) 150 MG 24 hr tablet    Sig: Take 1 tablet (150 mg total) by mouth daily.    Dispense:  30 tablet    Refill:  1    Order Specific Question:   Supervising Provider    Answer:   Hillard Danker A [[id number]]

## 2022-12-14 MED ORDER — BUPROPION HCL ER (XL) 150 MG PO TB24
150.0000 mg | ORAL_TABLET | Freq: Every day | ORAL | 1 refills | Status: DC
Start: 2022-12-14 — End: 2023-02-05

## 2023-01-03 ENCOUNTER — Encounter (INDEPENDENT_AMBULATORY_CARE_PROVIDER_SITE_OTHER): Payer: Medicaid Other | Admitting: Family Medicine

## 2023-02-03 ENCOUNTER — Other Ambulatory Visit: Payer: Self-pay | Admitting: Family Medicine

## 2023-02-03 DIAGNOSIS — F419 Anxiety disorder, unspecified: Secondary | ICD-10-CM

## 2023-03-30 ENCOUNTER — Encounter: Payer: Self-pay | Admitting: Family Medicine

## 2023-03-30 ENCOUNTER — Ambulatory Visit: Payer: Medicaid Other | Admitting: Family Medicine

## 2023-03-30 VITALS — BP 102/82 | HR 68 | Temp 97.6°F | Ht 63.0 in | Wt 276.0 lb

## 2023-03-30 DIAGNOSIS — R6883 Chills (without fever): Secondary | ICD-10-CM | POA: Diagnosis not present

## 2023-03-30 DIAGNOSIS — H9203 Otalgia, bilateral: Secondary | ICD-10-CM

## 2023-03-30 DIAGNOSIS — J029 Acute pharyngitis, unspecified: Secondary | ICD-10-CM | POA: Diagnosis not present

## 2023-03-30 DIAGNOSIS — H9193 Unspecified hearing loss, bilateral: Secondary | ICD-10-CM | POA: Diagnosis not present

## 2023-03-30 LAB — POCT RAPID STREP A (OFFICE): Rapid Strep A Screen: NEGATIVE

## 2023-03-30 LAB — POC COVID19 BINAXNOW: SARS Coronavirus 2 Ag: NEGATIVE

## 2023-03-30 NOTE — Progress Notes (Signed)
Subjective:     Patient ID: [name], female    DOB: Feb 04, 1976, 47 y.o.   MRN: [id number]  Chief Complaint  Patient presents with   Sore Throat    3 days   Emesis   Ear Pain    Bilateral earache    Sore Throat  Associated symptoms include vomiting.  Emesis     Discussed the use of AI scribe software for clinical note transcription with the patient, who gave verbal consent to proceed.  History of Present Illness          C/o 3 day hx of ST, bilateral ear pain, chills, frontal headache.   She also requests referral for hearing test due to hearing loss in both ears since childhood. Hx of tubes.   States she vomited 3 days ago but none the past 2 days. No fever, body aches, dizziness, sinus pain, chest pain, shortness of breath, abdominal pain, nausea or diarrhea.     Health Maintenance Due  Topic Date Due   Colonoscopy  Never done    Past Medical History:  Diagnosis Date   Asthma    no current med.   Breast cancer of upper-outer quadrant of left female breast (HCC) 07/16/2014   Heart murmur    as a child   History of seizures    during pregnancies; last seizure 3 yrs. ago   Personal history of radiation therapy 2016   Seizures (HCC)    no medications   Stress headaches     Past Surgical History:  Procedure Laterality Date   ABDOMINAL HYSTERECTOMY     BREAST BIOPSY Left 07/14/2014   x2   BREAST BIOPSY Left 08/21/2016   BREAST LUMPECTOMY Left 08/06/2014   BREAST SURGERY     lumpectomy and lymph nodes   CESAREAN SECTION  4098,1191   CESAREAN SECTION WITH BILATERAL TUBAL LIGATION  03/03/2003   CYSTO  12/20/2009   HYSTEROSCOPY W/ ENDOMETRIAL ABLATION  11/11/2007   LYSIS OF ADHESION  12/20/2009   TOTAL ABDOMINAL HYSTERECTOMY W/ BILATERAL SALPINGOOPHORECTOMY  12/20/2009   TYMPANOSTOMY TUBE PLACEMENT      Family History  Problem Relation Age of Onset   Hypertension Mother    Breast cancer Other        MGM's sister   Breast cancer Maternal  Grandmother        dx in her 103s    Social History   Socioeconomic History   Marital status: Single    Spouse name: Not on file   Number of children: 3   Years of education: Not on file   Highest education level: 10th grade  Occupational History   Not on file  Tobacco Use   Smoking status: Never   Smokeless tobacco: Never  Vaping Use   Vaping status: Never Used  Substance and Sexual Activity   Alcohol use: Not Currently    Comment: rarely   Drug use: No   Sexual activity: Yes    Birth control/protection: None  Other Topics Concern   Not on file  Social History Narrative   Not on file   Social Determinants of Health   Financial Resource Strain: Medium Risk (10/17/2022)   Overall Financial Resource Strain (CARDIA)    Difficulty of Paying Living Expenses: Somewhat hard  Food Insecurity: Food Insecurity Present (10/17/2022)   Hunger Vital Sign    Worried About Running Out of Food in the Last Year: Sometimes true    Ran Out of Food in the  Last Year: Never true  Transportation Needs: No Transportation Needs (10/17/2022)   PRAPARE - Administrator, Civil Service (Medical): No    Lack of Transportation (Non-Medical): No  Physical Activity: Unknown (10/17/2022)   Exercise Vital Sign    Days of Exercise per Week: Patient declined    Minutes of Exercise per Session: Not on file  Stress: Stress Concern Present (10/17/2022)   [employer] of Occupational Health - Occupational Stress Questionnaire    Feeling of Stress : Very much  Social Connections: Socially Isolated (10/17/2022)   Social Connection and Isolation Panel [NHANES]    Frequency of Communication with Friends and Family: Once a week    Frequency of Social Gatherings with Friends and Family: Once a week    Attends Religious Services: Never    Database administrator or Organizations: No    Attends Engineer, structural: Not on file    Marital Status: Living with partner  Intimate Partner  Violence: Unknown (09/26/2021)   Received from [employer], [facility]   HITS    Physically Hurt: Not on file    Insult or Talk Down To: Not on file    Threaten Physical Harm: Not on file    Scream or Curse: Not on file    Outpatient Medications Prior to Visit  Medication Sig Dispense Refill   buPROPion (WELLBUTRIN XL) 150 MG 24 hr tablet Take 1 tablet (150 mg total) by mouth daily. Please schedule follow up for further refills. 30 tablet 0   naproxen sodium (ALEVE) 220 MG tablet Take 220 mg by mouth as needed (pain).     ondansetron (ZOFRAN-ODT) 4 MG disintegrating tablet Take 1 tablet (4 mg total) by mouth every 8 (eight) hours as needed for nausea or vomiting. 20 tablet 0   Vitamin D, Ergocalciferol, (DRISDOL) 1.25 MG (50000 UNIT) CAPS capsule Take 1 capsule (50,000 Units total) by mouth every 7 (seven) days. 4 capsule 2   No facility-administered medications prior to visit.    No Known Allergies  Review of Systems  Gastrointestinal:  Positive for vomiting.       Objective:    Physical Exam Constitutional:      General: She is not in acute distress.    Appearance: She is not ill-appearing.  HENT:     Right Ear: Tympanic membrane and ear canal normal.     Left Ear: Tympanic membrane and ear canal normal.     Nose: No congestion.     Mouth/Throat:     Mouth: Mucous membranes are moist.     Pharynx: Posterior oropharyngeal erythema present. No pharyngeal swelling or oropharyngeal exudate.     Tonsils: No tonsillar exudate or tonsillar abscesses.  Eyes:     Extraocular Movements: Extraocular movements intact.     Conjunctiva/sclera: Conjunctivae normal.  Cardiovascular:     Rate and Rhythm: Normal rate and regular rhythm.  Pulmonary:     Effort: Pulmonary effort is normal.     Breath sounds: Normal breath sounds.  Abdominal:     General: Bowel sounds are normal. There is no distension.     Tenderness: There is no abdominal tenderness. There is no guarding.   Musculoskeletal:     Cervical back: Normal range of motion and neck supple.  Lymphadenopathy:     Cervical: No cervical adenopathy.  Skin:    General: Skin is warm and dry.  Neurological:     General: No focal deficit present.  Mental Status: She is alert and oriented to person, place, and time.  Psychiatric:        Mood and Affect: Mood normal.        Behavior: Behavior normal.        Thought Content: Thought content normal.      BP 102/82 (BP Location: Left Arm, Patient Position: Sitting, Cuff Size: Large)   Pulse 68   Temp 97.6 F (36.4 C) (Temporal)   Ht 5\' 3"  (1.6 m)   Wt 276 lb (125.2 kg)   SpO2 98%   BMI 48.89 kg/m  Wt Readings from Last 3 Encounters:  03/30/23 276 lb (125.2 kg)  12/13/22 275 lb 6.4 oz (124.9 kg)  10/17/22 277 lb (125.6 kg)       Assessment & Plan:   Problem List Items Addressed This Visit       Respiratory   Acute pharyngitis - Primary   Relevant Orders   POC COVID-19   POCT rapid strep A   Other Visit Diagnoses     Ear pain, bilateral       Relevant Orders   POC COVID-19   Bilateral hearing loss, unspecified hearing loss type       Relevant Orders   Ambulatory referral to Audiology   Chills       Relevant Orders   POC COVID-19   POCT rapid strep A      Covid test negative  Rapid strep test negative   Treat for acute viral illness. Discussed symptomatic treatment.  Follow up with new or worsening symptoms.   I am having Elenore Paddy. Stockert maintain her naproxen sodium, Vitamin D (Ergocalciferol), ondansetron, and buPROPion.  No orders of the defined types were placed in this encounter.

## 2023-03-30 NOTE — Patient Instructions (Addendum)
You appear to have a viral illness. Negative strep throat and Covid today.   Continue treating your symptoms and staying hydrated.   Salt water gargles for sore throat.   I have referred you to an audiologist for hearing tests. They will call you.   Let us know if you are not continuing to improve.

## 2023-05-10 ENCOUNTER — Ambulatory Visit: Payer: Medicaid Other | Admitting: Audiology

## 2023-05-21 ENCOUNTER — Ambulatory Visit: Payer: Medicaid Other | Attending: Audiologist | Admitting: Audiologist

## 2023-05-22 ENCOUNTER — Ambulatory Visit
Admission: EM | Admit: 2023-05-22 | Discharge: 2023-05-22 | Disposition: A | Discharge status: Home / Self Care | Payer: Medicaid Other | Attending: Physician Assistant | Admitting: Physician Assistant

## 2023-05-22 DIAGNOSIS — Z8619 Personal history of other infectious and parasitic diseases: Secondary | ICD-10-CM

## 2023-05-22 DIAGNOSIS — L282 Other prurigo: Secondary | ICD-10-CM

## 2023-05-22 DIAGNOSIS — G44209 Tension-type headache, unspecified, not intractable: Secondary | ICD-10-CM

## 2023-05-22 MED ORDER — VALACYCLOVIR HCL 1 G PO TABS: 2000.0000 mg | ORAL_TABLET | Freq: Once | ORAL | 0 refills | Status: AC

## 2023-05-22 MED ORDER — FAMOTIDINE 20 MG PO TABS: 20.0000 mg | ORAL_TABLET | Freq: Every day | ORAL | 0 refills | Status: DC

## 2023-05-22 MED ORDER — CETIRIZINE HCL 10 MG PO TABS: 10.0000 mg | ORAL_TABLET | Freq: Every day | ORAL | 0 refills | Status: DC

## 2023-05-22 MED ORDER — DEXAMETHASONE SODIUM PHOSPHATE 10 MG/ML IJ SOLN
10.0000 mg | Freq: Once | INTRAMUSCULAR | Status: AC
  Administered 2023-05-22: 10 mg via INTRAMUSCULAR

## 2023-05-22 MED ORDER — BACLOFEN 5 MG PO TABS: 5.0000 mg | ORAL_TABLET | Freq: Every evening | ORAL | 0 refills | Status: DC | PRN

## 2023-05-22 MED ORDER — KETOROLAC TROMETHAMINE 30 MG/ML IJ SOLN
30.0000 mg | Freq: Once | INTRAMUSCULAR | Status: AC
  Administered 2023-05-22: 30 mg via INTRAMUSCULAR

## 2023-05-22 NOTE — ED Provider Notes (Signed)
[facility]    CSN: [id number] Arrival date & time: 05/22/23  1406      History   Chief Complaint No chief complaint on file.   HPI [name] is a 47 y.o. female.   Patient presents today with a several day history of pruritic rash on her left forearm and around her abdomen.  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  She did have chickenpox when she was much younger.  Denies any changes to personal hygiene products including soaps or detergents.  Denies any exposure to plants, insects, animals.  She has been taking over-the-counter medications because she has a headache for the past 4 days.  She does report a history of intermittent headaches with last episode over a year ago.  Currently headache pain is rated 5 on a 0-10 pain scale, described as aching, no relieving factors identified.  Denies any recent illness including cough or congestion.  This is not the worst headache of her life.  Denies any associated visual disturbance, dysarthria, nausea, vomiting, focal weakness.  Has been taking Excedrin, BC powders, Goody's, Aleve without improvement.  She is unsure if she could be reacting to some of those medications or something else.    Past Medical History:  Diagnosis Date   Asthma    no current med.   Breast cancer of upper-outer quadrant of left female breast (HCC) 07/16/2014   Heart murmur    as a child   History of seizures    during pregnancies; last seizure 3 yrs. ago   Personal history of radiation therapy 2016   Seizures (HCC)    no medications   Stress headaches     Patient Active Problem List   Diagnosis Date Noted   Passive smoke exposure 12/13/2022   Nightmares 12/13/2022   Chronic midline low back pain without sciatica 04/05/2022   Bilateral leg pain 04/05/2022   Acute pharyngitis 03/07/2022   Right-sided face pain 03/07/2022   Abnormal radiologic finding of lung field 02/22/2022    Cough present for greater than 3 weeks 02/22/2022   Right sided abdominal pain 02/20/2022   Dizziness 11/16/2021   Urinary incontinence 11/16/2021   Eye twitch 11/16/2021   History of seizures 11/16/2021   Mild intermittent asthma without complication 11/10/2021   Morbid obesity (HCC) 11/10/2021   Mild persistent asthma with acute exacerbation 11/10/2021   Productive cough 11/10/2021   Vitamin D deficiency 10/31/2021   Prediabetes 10/31/2021   History of breast cancer 10/23/2019   Anxiety 07/05/2015   Breast cancer of upper-outer quadrant of left female breast (HCC) 07/16/2014   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 12/20/2009   SEIZURE DISORDER 07/07/2009   Anxiety and depression 08/24/2008    Past Surgical History:  Procedure Laterality Date   ABDOMINAL HYSTERECTOMY     BREAST BIOPSY Left 07/14/2014   x2   BREAST BIOPSY Left 08/21/2016   BREAST LUMPECTOMY Left 08/06/2014   BREAST SURGERY     lumpectomy and lymph nodes   CESAREAN SECTION  4098,1191   CESAREAN SECTION WITH BILATERAL TUBAL LIGATION  03/03/2003   CYSTO  12/20/2009   HYSTEROSCOPY W/ ENDOMETRIAL ABLATION  11/11/2007   LYSIS OF ADHESION  12/20/2009   TOTAL ABDOMINAL HYSTERECTOMY W/ BILATERAL SALPINGOOPHORECTOMY  12/20/2009   TYMPANOSTOMY TUBE PLACEMENT      OB History   No obstetric history on file.      Home Medications  Prior to Admission medications   Medication Sig Start Date End Date Taking? Authorizing Provider  Baclofen 5 MG TABS Take 1 tablet (5 mg total) by mouth at bedtime as needed. 05/22/23  Yes Wilgus Deyton, Noberto Retort, PA-C  cetirizine (ZYRTEC ALLERGY) 10 MG tablet Take 1 tablet (10 mg total) by mouth daily. 05/22/23  Yes Taysen Bushart K, PA-C  famotidine (PEPCID) 20 MG tablet Take 1 tablet (20 mg total) by mouth at bedtime. 05/22/23  Yes Joory Gough, Noberto Retort, PA-C  valACYclovir (VALTREX) 1000 MG tablet Take 2 tablets (2,000 mg total) by mouth once for 1 dose. 05/22/23 05/22/23 Yes Jakyla Reza,  Junita Kubota K, PA-C  buPROPion (WELLBUTRIN XL) 150 MG 24 hr tablet Take 1 tablet (150 mg total) by mouth daily. Please schedule follow up for further refills. 02/05/23   Henson, Vickie L, NP-C  naproxen sodium (ALEVE) 220 MG tablet Take 220 mg by mouth as needed (pain).    [provider]  ondansetron (ZOFRAN-ODT) 4 MG disintegrating tablet Take 1 tablet (4 mg total) by mouth every 8 (eight) hours as needed for nausea or vomiting. 10/27/22   Henson, Vickie L, NP-C  Vitamin D, Ergocalciferol, (DRISDOL) 1.25 MG (50000 UNIT) CAPS capsule Take 1 capsule (50,000 Units total) by mouth every 7 (seven) days. 10/17/22   Henson, Vickie L, NP-C  fluticasone (FLONASE) 50 MCG/ACT nasal spray Place 1-2 sprays into both nostrils daily. 05/26/20 06/06/20  Wieters, Junius Creamer, PA-C    Family History Family History  Problem Relation Age of Onset   Hypertension Mother    Breast cancer Other        MGM's sister   Breast cancer Maternal Grandmother        dx in her 48s    Social History Social History   Tobacco Use   Smoking status: Never   Smokeless tobacco: Never  Vaping Use   Vaping status: Never Used  Substance Use Topics   Alcohol use: Not Currently    Comment: rarely   Drug use: No     Allergies   Patient has no known allergies.   Review of Systems Review of Systems  Constitutional:  Positive for activity change. Negative for appetite change, fatigue and fever.  Eyes:  Negative for visual disturbance.  Respiratory:  Negative for cough and shortness of breath.   Cardiovascular:  Negative for chest pain.  Gastrointestinal:  Negative for abdominal pain, diarrhea, nausea and vomiting.  Musculoskeletal:  Negative for arthralgias and myalgias.  Skin:  Positive for rash.  Neurological:  Positive for headaches. Negative for dizziness, seizures, syncope, speech difficulty, light-headedness and numbness.     Physical Exam Triage Vital Signs ED Triage Vitals  Encounter Vitals Group     BP  05/22/23 1600 124/72     Systolic BP Percentile --      Diastolic BP Percentile --      Pulse Rate 05/22/23 1600 64     Resp 05/22/23 1600 18     Temp 05/22/23 1600 98.3 F (36.8 C)     Temp Source 05/22/23 1600 Oral     SpO2 05/22/23 1600 100 %     Weight 05/22/23 1559 250 lb (113.4 kg)     Height 05/22/23 1559 5\' 3"  (1.6 m)     Head Circumference --      Peak Flow --      Pain Score 05/22/23 1558 2     Pain Loc --      Pain Education --  Exclude from Growth Chart --    No data found.  Updated Vital Signs BP 124/72 (BP Location: Right Wrist)   Pulse 64   Temp 98.3 F (36.8 C) (Oral)   Resp 18   Ht 5\' 3"  (1.6 m)   Wt 250 lb (113.4 kg)   SpO2 100%   BMI 44.29 kg/m   Visual Acuity Right Eye Distance:   Left Eye Distance:   Bilateral Distance:    Right Eye Near:   Left Eye Near:    Bilateral Near:     Physical Exam Vitals reviewed.  Constitutional:      General: She is awake. She is not in acute distress.    Appearance: Normal appearance. She is well-developed. She is not ill-appearing.     Comments: Very pleasant female appears stated age in no acute distress sitting comfortably in exam room  HENT:     Head: Normocephalic and atraumatic. No raccoon eyes, Battle's sign or contusion.     Right Ear: Tympanic membrane, ear canal and external ear normal. No hemotympanum.     Left Ear: Tympanic membrane, ear canal and external ear normal. No hemotympanum.     Nose: Nose normal.     Mouth/Throat:     Tongue: Tongue does not deviate from midline.     Pharynx: Uvula midline. No oropharyngeal exudate or posterior oropharyngeal erythema.  Eyes:     Extraocular Movements: Extraocular movements intact.     Pupils: Pupils are equal, round, and reactive to light.  Cardiovascular:     Rate and Rhythm: Normal rate and regular rhythm.     Heart sounds: Normal heart sounds, S1 normal and S2 normal. No murmur heard. Pulmonary:     Effort: Pulmonary effort is normal.      Breath sounds: Normal breath sounds. No wheezing, rhonchi or rales.     Comments: Clear to auscultation bilaterally Abdominal:     Palpations: Abdomen is soft.     Tenderness: There is no abdominal tenderness.  Musculoskeletal:     Cervical back: Normal range of motion and neck supple.     Comments: Strength 5/5 bilateral upper and lower extremities  Lymphadenopathy:     Head:     Right side of head: No submental, submandibular or tonsillar adenopathy.     Left side of head: No submental, submandibular or tonsillar adenopathy.  Skin:    Findings: Rash present. Rash is papular.     Comments: 3 distinct erythematous papules measuring approximately 0.5 cm noted left anterior forearm.  Erythema noted along pant line of abdomen with some evidence of excoriation.  Neurological:     General: No focal deficit present.     Mental Status: She is alert and oriented to person, place, and time.     Cranial Nerves: Cranial nerves 2-12 are intact.     Motor: Motor function is intact.     Coordination: Coordination is intact.     Gait: Gait is intact.     Comments: Cranial nerves II through XII grossly intact.  Psychiatric:        Behavior: Behavior is cooperative.      UC Treatments / Results  Labs (all labs ordered are listed, but only abnormal results are displayed) Labs Reviewed - No data to display  EKG   Radiology No results found.  Procedures Procedures (including critical care time)  Medications Ordered in UC Medications  ketorolac (TORADOL) 30 MG/ML injection 30 mg (30 mg Intramuscular Given 05/22/23 1640)  dexamethasone (DECADRON) injection 10 mg (10 mg Intramuscular Given 05/22/23 1640)    Initial Impression / Assessment and Plan / UC Course  I have reviewed the triage vital signs and the nursing notes.  Pertinent labs & imaging results that were available during my care of the patient were reviewed by me and considered in my medical decision making (see chart for  details).     Patient is well-appearing, afebrile, nontoxic, nontachycardic.  Vital signs and physical exam are reassuring with no indication for emergent evaluation or imaging.  Concern for tension headache given her clinical presentation.  She was given Toradol and dexamethasone in clinic with improvement of symptoms; her pain improved to a 3 after approximately 30 minutes from her injection.  We discussed that she is not to take NSAIDs for the next 24 hours but can use acetaminophen/Tylenol.  She is to rest and drink plenty of fluid.  She was given baclofen for additional pain relief.  We discussed that this can be sedating so she should take it primarily at night.  If her symptoms are not improving within a few days or if she has any worsening symptoms including worsening of her life, visual disturbance, weakness, nausea, vomiting, dysarthria she needs to be seen emergently to which she expressed understanding.  Patient had improvement of rash and pruritus following dexamethasone injection.  Discussed that this likely means it is a reaction to something and I have a low suspicion for shingles given bilateral distribution of the rash/itching and current presentation.  We discussed that if she develops any blistering or painful rash she should return for reevaluation.  Given her improvement with the dexamethasone will cover with H1 and H2 blockade.  She was encouraged to wear loosefitting cotton clothing and use hypoallergenic soaps and detergents.  Discussed that if anything changes or worsens she is to return for reevaluation.  As I was leaving, patient reports that she often gets fever blisters/cold sores.  She requested medication to treat this.  She is currently asymptomatic but I did call in a dose of valacyclovir should she develop symptoms.   Final Clinical Impressions(s) / UC Diagnoses   Final diagnoses:  Tension headache  Pruritic rash  History of cold sores     Discharge Instructions       I am glad that you are feeling better after the medication.  Because we gave you Toradol please do not take NSAIDs (aspirin, ibuprofen/Advil, naproxen/Aleve) for 24 hours.  You can use Tylenol/acetaminophen.  Take baclofen at night.  This will make you sleepy so do not drive or drink alcohol with taking it.  Make sure that you rest and drink plenty of fluid.  If you have any worsening symptoms including the worst headache of your life, visual change, weakness, nausea, vomiting you need to be seen immediately.  I think the rash is a reaction to something.  I am hoping that the injection of steroids that we gave you today will help manage this.  Start cetirizine daily and famotidine at night.  Wear loosefitting cotton clothing and use hypoallergenic soaps and detergents.  If you develop any kind of painful/blistering rash or if anything changes return for reevaluation.  I have called valacyclovir to the pharmacy in case you develop a cold sore as we discussed.     ED Prescriptions     Medication Sig Dispense Auth. Provider   Baclofen 5 MG TABS Take 1 tablet (5 mg total) by mouth at bedtime as needed. 7  tablet Iram Lundberg K, PA-C   cetirizine (ZYRTEC ALLERGY) 10 MG tablet Take 1 tablet (10 mg total) by mouth daily. 7 tablet Donoven Pett K, PA-C   famotidine (PEPCID) 20 MG tablet Take 1 tablet (20 mg total) by mouth at bedtime. 7 tablet Layth Cerezo K, PA-C   valACYclovir (VALTREX) 1000 MG tablet Take 2 tablets (2,000 mg total) by mouth once for 1 dose. 2 tablet Shanitha Twining, Noberto Retort, PA-C      PDMP not reviewed this encounter.   Jeani Hawking, PA-C 05/22/23 1715

## 2023-05-22 NOTE — ED Triage Notes (Signed)
Patient presents with bumps on left arm, burning and itching around stomach area 2-3. Patient also states she has headache x day 4. Treated Aleve, Excedrin migraine, BC and goodies without relief.

## 2023-05-22 NOTE — Discharge Instructions (Signed)
I am glad that you are feeling better after the medication.  Because we gave you Toradol please do not take NSAIDs (aspirin, ibuprofen/Advil, naproxen/Aleve) for 24 hours.  You can use Tylenol/acetaminophen.  Take baclofen at night.  This will make you sleepy so do not drive or drink alcohol with taking it.  Make sure that you rest and drink plenty of fluid.  If you have any worsening symptoms including the worst headache of your life, visual change, weakness, nausea, vomiting you need to be seen immediately.  I think the rash is a reaction to something.  I am hoping that the injection of steroids that we gave you today will help manage this.  Start cetirizine daily and famotidine at night.  Wear loosefitting cotton clothing and use hypoallergenic soaps and detergents.  If you develop any kind of painful/blistering rash or if anything changes return for reevaluation.  I have called valacyclovir to the pharmacy in case you develop a cold sore as we discussed.

## 2023-05-23 ENCOUNTER — Ambulatory Visit: Payer: Medicaid Other | Admitting: Internal Medicine

## 2023-06-18 ENCOUNTER — Telehealth: Payer: Medicaid Other | Admitting: Physician Assistant

## 2023-06-18 DIAGNOSIS — A084 Viral intestinal infection, unspecified: Secondary | ICD-10-CM

## 2023-06-18 MED ORDER — LOPERAMIDE HCL 2 MG PO CAPS: 2.0000 mg | ORAL_CAPSULE | ORAL | 0 refills | Status: AC | PRN

## 2023-06-18 MED ORDER — ONDANSETRON 4 MG PO TBDP: 4.0000 mg | ORAL_TABLET | Freq: Three times a day (TID) | ORAL | 0 refills | Status: AC | PRN

## 2023-06-18 NOTE — Progress Notes (Signed)
Virtual Visit Consent   [name], you are scheduled for a virtual visit with a [facility] provider today. Just as with appointments in the office, your consent must be obtained to participate. Your consent will be active for this visit and any virtual visit you may have with one of our providers in the next 365 days. If you have a MyChart account, a copy of this consent can be sent to you electronically.  As this is a virtual visit, video technology does not allow for your provider to perform a traditional examination. This may limit your provider's ability to fully assess your condition. If your provider identifies any concerns that need to be evaluated in person or the need to arrange testing (such as labs, EKG, etc.), we will make arrangements to do so. Although advances in technology are sophisticated, we cannot ensure that it will always work on either your end or our end. If the connection with a video visit is poor, the visit may have to be switched to a telephone visit. With either a video or telephone visit, we are not always able to ensure that we have a secure connection.  By engaging in this virtual visit, you consent to the provision of healthcare and authorize for your insurance to be billed (if applicable) for the services provided during this visit. Depending on your insurance coverage, you may receive a charge related to this service.  I need to obtain your verbal consent now. Are you willing to proceed with your visit today? [name] has provided verbal consent on 06/18/2023 for a virtual visit (video or telephone). Piedad Climes, [place]  Date: 06/18/2023 12:24 PM  Virtual Visit via Video Note   I, Piedad Climes, connected with  [name]  ([id number], [birth year]) on 06/18/23 at 12:15 PM EST by a video-enabled telemedicine application and verified that I am speaking with the correct person using two identifiers.  Location: Patient: Virtual  Visit Location Patient: Home Provider: Virtual Visit Location Provider: Home Office   I discussed the limitations of evaluation and management by telemedicine and the availability of in person appointments. The patient expressed understanding and agreed to proceed.    History of Present Illness: [name] is a 47 y.o. who identifies as a female who was assigned female at birth, and is being seen today for 3.5 days of headache, fatigue, nausea and diarrhea with low-grade fever. Only one-episode of vomiting at onset of symptoms. Stools are still loose and frequent, but non-bloody. Tmax 101 at onset of symptoms.  Denies recent travel. Grandchildren sick last week.   OTC -- Pepto Bismol, Tylenol  HPI: HPI  Problems:  Patient Active Problem List   Diagnosis Date Noted   Passive smoke exposure 12/13/2022   Nightmares 12/13/2022   Chronic midline low back pain without sciatica 04/05/2022   Bilateral leg pain 04/05/2022   Acute pharyngitis 03/07/2022   Right-sided face pain 03/07/2022   Abnormal radiologic finding of lung field 02/22/2022   Cough present for greater than 3 weeks 02/22/2022   Right sided abdominal pain 02/20/2022   Dizziness 11/16/2021   Urinary incontinence 11/16/2021   Eye twitch 11/16/2021   History of seizures 11/16/2021   Mild intermittent asthma without complication 11/10/2021   Morbid obesity (HCC) 11/10/2021   Mild persistent asthma with acute exacerbation 11/10/2021   Productive cough 11/10/2021   Vitamin D deficiency 10/31/2021   Prediabetes 10/31/2021   History of breast cancer 10/23/2019  Anxiety 07/05/2015   Breast cancer of upper-outer quadrant of left female breast (HCC) 07/16/2014   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 12/20/2009   SEIZURE DISORDER 07/07/2009   Anxiety and depression 08/24/2008    Allergies: No Known Allergies Medications:  Current Outpatient Medications:    naproxen sodium (ALEVE) 220 MG tablet, Take  220 mg by mouth as needed (pain)., Disp: , Rfl:   Observations/Objective: Patient is well-developed, well-nourished in no acute distress.  Resting comfortably at home.  Head is normocephalic, atraumatic.  No labored breathing. Speech is clear and coherent with logical content.  Patient is alert and oriented at baseline.   Assessment and Plan: 1. Viral gastroenteritis (Primary)  Supportive measures and OTC medications reviewed. Zofran and Imodium per orders.  Increase in hydration is key. Start bland diet once medication is better controlling nausea. Strict ER precautions reviewed. Work note provided.   Follow Up Instructions: I discussed the assessment and treatment plan with the patient. The patient was provided an opportunity to ask questions and all were answered. The patient agreed with the plan and demonstrated an understanding of the instructions.  A copy of instructions were sent to the patient via MyChart unless otherwise noted below.   The patient was advised to call back or seek an in-person evaluation if the symptoms worsen or if the condition fails to improve as anticipated.    Piedad Climes, PA-C

## 2023-06-18 NOTE — Patient Instructions (Signed)
Joycelyn Schmid, thank you for joining Piedad Climes, PA-C for today's virtual visit.  While this provider is not your primary care provider (PCP), if your PCP is located in our provider database this encounter information will be shared with them immediately following your visit.   A [facility] MyChart account gives you access to today's visit and all your visits, tests, and labs performed at [facility] " click here if you don't have a [facility] MyChart account or go to [email or web address]  Consent: (Patient) [name] [facility] provided verbal consent for this virtual visit at the beginning of the encounter.  Current Medications:  Current Outpatient Medications:    naproxen sodium (ALEVE) 220 MG tablet, Take 220 mg by mouth as needed (pain)., Disp: , Rfl:    Medications ordered in this encounter:  No orders of the defined types were placed in this encounter.    *If you need refills on other medications prior to your next appointment, please contact your pharmacy*  Follow-Up: Call back or seek an in-person evaluation if the symptoms worsen or if the condition fails to improve as anticipated.  [facility] [phone]  Other Instructions Viral Gastroenteritis, Adult  Viral gastroenteritis is also known as the stomach flu. This condition may affect your stomach, your small intestine, and your large intestine. It can cause sudden watery poop (diarrhea), fever, and vomiting. This condition is caused by certain germs (viruses). These germs can be passed from person to person very easily (are contagious). Having watery poop and vomiting can make you feel weak and cause you to not have enough water in your body (get dehydrated). This can make you tired and thirsty, make you have a dry mouth, and make it so you pee (urinate) less often. It is important to replace the fluids that you lose from having watery poop and vomiting. What are the  causes? You can get sick by catching germs from other people. You can also get sick by: Eating food, drinking water, or touching a surface that has the germs on it (is contaminated). Sharing utensils or other personal items with a person who is sick. What increases the risk? Having a weak body defense system (immune system). Living with one or more children who are younger than 2 years. Living in a nursing home. Going on cruise ships. What are the signs or symptoms? Symptoms of this condition start suddenly. Symptoms may last for a few days or for as long as a week. Common symptoms include: Watery poop. Vomiting. Other symptoms include: Fever. Headache. Feeling tired (fatigue). Pain in the belly (abdomen). Chills. Feeling weak. Feeling like you may vomit (nauseous). Muscle aches. Not feeling hungry. How is this treated? This condition typically goes away on its own. The focus of treatment is to replace the fluids that you lose. This condition may be treated with: An ORS (oral rehydration solution). This is a drink that helps you replace fluids and minerals your body lost. It is sold at pharmacies and stores. Medicines to help with your symptoms. Probiotic supplements to reduce symptoms of watery poop. Fluids given through an IV tube, if needed. Older adults and people with other diseases or a weak body defense system are at higher risk for not having enough water in the body. Follow these instructions at home: Eating and drinking  Take an ORS as told by your doctor. Drink clear fluids in small amounts as you are able. Clear fluids include: Water. Ice chips. Fruit  juice that has water added to it (is diluted). Low-calorie sports drinks. Drink enough fluid to keep your pee (urine) pale yellow. Eat small amounts of healthy foods every 3-4 hours as you are able. This may include whole grains, fruits, vegetables, lean meats, and yogurt. Avoid fluids that have a lot of sugar or  caffeine in them. This includes energy drinks, sports drinks, and soda. Avoid spicy or fatty foods. Avoid alcohol. General instructions  Wash your hands often. This is very important after you have watery poop or you vomit. If you cannot use soap and water, use hand sanitizer. Make sure that all people in your home wash their hands well and often. Take over-the-counter and prescription medicines only as told by your doctor. Rest at home while you get better. Watch your condition for any changes. Take a warm bath to help with any burning or pain from having watery poop. Keep all follow-up visits. Contact a doctor if: You cannot keep fluids down. Your symptoms get worse. You have new symptoms. You feel light-headed or dizzy. You have muscle cramps. Get help right away if: You have chest pain. You have trouble breathing, or you are breathing very fast. You have a fast heartbeat. You feel very weak or you faint. You have a very bad headache, a stiff neck, or both. You have a rash. You have very bad pain, cramping, or bloating in your belly. Your skin feels cold and clammy. You feel mixed up (confused). You have pain when you pee. You have signs of not having enough water in the body, such as: Dark pee, hardly any pee, or no pee. Cracked lips. Dry mouth. Sunken eyes. Feeling very sleepy. Feeling weak. You have signs of bleeding, such as: You see blood in your vomit. Your vomit looks like coffee grounds. You have bloody or black poop or poop that looks like tar. These symptoms may be an emergency. Get help right away. Call 911. Do not wait to see if the symptoms will go away. Do not drive yourself to the hospital. Summary Viral gastroenteritis is also known as the stomach flu. This condition can cause sudden watery poop (diarrhea), fever, and vomiting. These germs can be passed from person to person very easily. Take an ORS (oral rehydration solution) as told by your doctor.  This is a drink that is sold at pharmacies and stores. Wash your hands often, especially after having watery poop or vomiting. If you cannot use soap and water, use hand sanitizer. This information is not intended to replace advice given to you by your health care provider. Make sure you discuss any questions you have with your health care provider. Document Revised: 04/11/2021 Document Reviewed: 04/11/2021 Elsevier Patient Education  2024 Elsevier Inc.    If you have been instructed to have an in-person evaluation today at a local Urgent Care facility, please use the link below. It will take you to a list of all of our available [facility], including address, phone number and hours of operation. Please do not delay care.  [facility]  If you or a family member do not have a primary care provider, use the link below to schedule a visit and establish care. When you choose a [facility] primary care physician or advanced practice provider, you gain a long-term partner in health. Find a Primary Care Provider  Learn more about [facility]'s in-office and virtual care options: [facility] - Get Care Now

## 2023-06-29 ENCOUNTER — Ambulatory Visit: Payer: Medicaid Other | Admitting: Audiologist

## 2023-07-23 ENCOUNTER — Telehealth: Payer: Medicaid Other | Admitting: Physician Assistant

## 2023-07-23 DIAGNOSIS — J069 Acute upper respiratory infection, unspecified: Secondary | ICD-10-CM

## 2023-07-23 MED ORDER — PSEUDOEPH-BROMPHEN-DM 30-2-10 MG/5ML PO SYRP: 5.0000 mL | ORAL_SOLUTION | Freq: Four times a day (QID) | ORAL | 0 refills | Status: DC | PRN

## 2023-07-23 NOTE — Progress Notes (Signed)

## 2023-07-25 DIAGNOSIS — J101 Influenza due to other identified influenza virus with other respiratory manifestations: Secondary | ICD-10-CM | POA: Diagnosis not present

## 2023-07-25 DIAGNOSIS — R0981 Nasal congestion: Secondary | ICD-10-CM | POA: Diagnosis not present

## 2023-07-25 DIAGNOSIS — R059 Cough, unspecified: Secondary | ICD-10-CM | POA: Diagnosis not present

## 2023-08-01 ENCOUNTER — Ambulatory Visit: Payer: Medicaid Other | Admitting: Audiologist

## 2023-08-20 ENCOUNTER — Ambulatory Visit: Payer: Medicaid Other | Admitting: Audiologist

## 2023-08-23 ENCOUNTER — Encounter (INDEPENDENT_AMBULATORY_CARE_PROVIDER_SITE_OTHER): Payer: Self-pay

## 2023-09-04 DIAGNOSIS — J069 Acute upper respiratory infection, unspecified: Secondary | ICD-10-CM | POA: Diagnosis not present

## 2023-09-04 DIAGNOSIS — R062 Wheezing: Secondary | ICD-10-CM | POA: Diagnosis not present

## 2023-09-04 DIAGNOSIS — R051 Acute cough: Secondary | ICD-10-CM | POA: Diagnosis not present

## 2023-09-04 DIAGNOSIS — R0981 Nasal congestion: Secondary | ICD-10-CM | POA: Diagnosis not present

## 2023-09-26 ENCOUNTER — Ambulatory Visit: Admitting: Family Medicine

## 2023-09-26 ENCOUNTER — Encounter: Payer: Self-pay | Admitting: Family Medicine

## 2023-09-26 VITALS — BP 112/78 | HR 70 | Temp 97.6°F | Ht 63.0 in | Wt 286.4 lb

## 2023-09-26 DIAGNOSIS — F331 Major depressive disorder, recurrent, moderate: Secondary | ICD-10-CM | POA: Diagnosis not present

## 2023-09-26 DIAGNOSIS — J302 Other seasonal allergic rhinitis: Secondary | ICD-10-CM | POA: Diagnosis not present

## 2023-09-26 DIAGNOSIS — M25551 Pain in right hip: Secondary | ICD-10-CM

## 2023-09-26 DIAGNOSIS — M62838 Other muscle spasm: Secondary | ICD-10-CM | POA: Diagnosis not present

## 2023-09-26 DIAGNOSIS — R519 Headache, unspecified: Secondary | ICD-10-CM

## 2023-09-26 MED ORDER — KETOROLAC TROMETHAMINE 60 MG/2ML IM SOLN
60.0000 mg | Freq: Once | INTRAMUSCULAR | Status: AC
  Administered 2023-09-26: 60 mg via INTRAMUSCULAR

## 2023-09-26 MED ORDER — LEVOCETIRIZINE DIHYDROCHLORIDE 5 MG PO TABS
5.0000 mg | ORAL_TABLET | Freq: Every evening | ORAL | 1 refills | Status: AC
Start: 2023-09-26 — End: ?

## 2023-09-26 MED ORDER — CYCLOBENZAPRINE HCL 5 MG PO TABS: 5.0000 mg | ORAL_TABLET | Freq: Three times a day (TID) | ORAL | 1 refills | Status: AC | PRN

## 2023-09-26 MED ORDER — METHYLPREDNISOLONE ACETATE 80 MG/ML IJ SUSP
80.0000 mg | Freq: Once | INTRAMUSCULAR | Status: AC
  Administered 2023-09-26: 80 mg via INTRAMUSCULAR

## 2023-09-26 NOTE — Patient Instructions (Addendum)
 [facility] [phone]  [place] [phone]  [facility] Mixer [phone]  You have received a steroid injection in the office today.  You have received an injection of Toradol in office today for pain.  Do not take any NSAIDs including aspirin, Aleve, ibuprofen, Celebrex, meloxicam within the next 6 hours after your injection.  I have sent in a muscle relaxer for you to use one tablet as needed for muscle spasms. This medication can make you sleepy. Do not drive until you know how this medication affects you.  May use heat or ice to the area for relief as needed.  May use topical rubs or gels to the area as needed for relief.  Attached subtle stretching exercises to help relieve pain and strengthen muscles.   I placed a referral for you today for sports medicine.  Someone will be reaching out to you to get you scheduled.  Follow-up with me for new or worsening symptoms.

## 2023-09-26 NOTE — Progress Notes (Signed)
 Acute Office Visit  Subjective:     Patient ID: [name], female    DOB: 02/24/1976, 48 y.o.   MRN: [id number]  Chief Complaint  Patient presents with   Acute Visit    Headache for 4 days,    HPI Patient is in today for evaluation of headache for the last 4 days. She is inquiring if this could be related to seasonal allergies. Reports pain to right posterior neck that comes around to the right ear, right side of her head.  Endorses photophobia, phonophobia. Has tried Excedrin Migraine, plain Tylenol, Advil, states nothing has made a difference. Took Claritin at 4 AM this morning, nothing since then. Denies previous episodes. Denies any numbness, tingling, chest pain, palpitations, loss of strength in extremities, vision changes. Separately reports right hip pain that has been going on for about a year.  States that it is worse with weightbearing.  Has never had this worked up. Requesting referral today for hip pain. Requesting information on mental health providers that accept Medicaid.  States she has been having trouble finding motivation to get out of bed, completing tasks, not finding much enjoyment in things. Denies SI, HI. Denies other concerns today.  ROS Per HPI      Objective:    BP 112/78 (BP Location: Left Arm, Patient Position: Sitting)   Pulse 70   Temp 97.6 F (36.4 C) (Temporal)   Ht 5\' 3"  (1.6 m)   Wt 286 lb 6.4 oz (129.9 kg)   SpO2 95%   BMI 50.73 kg/m    Physical Exam Vitals and nursing note reviewed.  Constitutional:      General: She is not in acute distress.    Appearance: Normal appearance. She is obese.     Comments: Appears fatigued  HENT:     Head: Normocephalic and atraumatic.     Right Ear: Tympanic membrane, ear canal and external ear normal.     Left Ear: Tympanic membrane, ear canal and external ear normal.     Nose: Nose normal.     Mouth/Throat:     Mouth: Mucous membranes are moist.     Pharynx: Oropharynx is  clear.  Eyes:     Extraocular Movements: Extraocular movements intact.     Pupils: Pupils are equal, round, and reactive to light.  Neck:      Comments: Area of muscle spasm, tenderness Cardiovascular:     Rate and Rhythm: Normal rate and regular rhythm.     Pulses: Normal pulses.     Heart sounds: Normal heart sounds.  Pulmonary:     Effort: Pulmonary effort is normal. No respiratory distress.     Breath sounds: Normal breath sounds. No wheezing, rhonchi or rales.  Musculoskeletal:     Cervical back: Muscular tenderness present. Decreased range of motion.       Back:     Right lower leg: No edema.     Left lower leg: No edema.     Comments: Area of tenderness, mild swelling, decreased ROM.  No erythema, bruising, known injury, obvious deformity.  Lymphadenopathy:     Cervical: No cervical adenopathy.  Neurological:     General: No focal deficit present.     Mental Status: She is alert and oriented to person, place, and time.  Psychiatric:        Mood and Affect: Mood normal.        Thought Content: Thought content normal.    No results found for any  visits on 09/26/23.      Assessment & Plan:   Nonintractable headache, unspecified chronicity pattern, unspecified headache type -     methylPREDNISolone Acetate -     Ketorolac Tromethamine -     Cyclobenzaprine HCl; Take 1 tablet (5 mg total) by mouth 3 (three) times daily as needed for muscle spasms.  Dispense: 30 tablet; Refill: 1  Seasonal allergies -     Levocetirizine Dihydrochloride; Take 1 tablet (5 mg total) by mouth every evening.  Dispense: 90 tablet; Refill: 1  Right hip pain -     Ambulatory referral to Sports Medicine  Muscle spasm -     Cyclobenzaprine HCl; Take 1 tablet (5 mg total) by mouth 3 (three) times daily as needed for muscle spasms.  Dispense: 30 tablet; Refill: 1  MDD (major depressive disorder), recurrent episode, moderate (HCC)  Contact information provided for multiple mental health  providers that will accept Medicaid.  May try Xyzal to help with seasonal allergies, to pharmacy  Sedation precautions given for muscle relaxers. Education handout provided for stretching exercises for her neck  Meds ordered this encounter  Medications   levocetirizine (XYZAL) 5 MG tablet    Sig: Take 1 tablet (5 mg total) by mouth every evening.    Dispense:  90 tablet    Refill:  1   methylPREDNISolone acetate (DEPO-MEDROL) injection 80 mg   ketorolac (TORADOL) injection 60 mg   cyclobenzaprine (FLEXERIL) 5 MG tablet    Sig: Take 1 tablet (5 mg total) by mouth 3 (three) times daily as needed for muscle spasms.    Dispense:  30 tablet    Refill:  1    Return if symptoms worsen or fail to improve.  Sherald Barge, FNP

## 2023-09-27 ENCOUNTER — Ambulatory Visit: Payer: Medicaid Other | Admitting: Audiologist

## 2023-09-28 NOTE — Progress Notes (Signed)
 [name] D.[facility] [address] [place] [address] Phone: [phone]   Assessment and Plan:     1. Chronic bilateral low back pain without sciatica 2. Right hip pain 3. Greater trochanteric bursitis of right hip -Chronic with exacerbation, initial sports medicine visit - Right lateral hip and low back pain present for several months and worsening.  Most consistent with mild degenerative changes in lumbar spine, lumbar musculature strain and weakness, right greater trochanteric bursitis - Start meloxicam 15 mg daily x2 weeks.  If still having pain after 2 weeks, complete 3rd-week of NSAID. May use remaining NSAID as needed once daily for pain control.  Do not to use additional over-the-counter NSAIDs (ibuprofen, naproxen, Advil, Aleve, etc.) while taking prescription NSAIDs.  May use Tylenol [phone] mg 2 to 3 times a day for breakthrough pain. - Start HEP for back and gluteal musculature - X-rays obtained in clinic.  My interpretation: No acute fracture or dislocation or vertebral collapse.  Mild degenerative changes throughout lumbar spine, bilateral femoral acetabular joints   15 additional minutes spent for educating Therapeutic Home Exercise Program.  This included exercises focusing on stretching, strengthening, with focus on eccentric aspects.   Long term goals include an improvement in range of motion, strength, endurance as well as avoiding reinjury. Patient's frequency would include in 1-2 times a day, 3-5 times a week for a duration of 6-12 weeks. Proper technique shown and discussed handout in great detail with ATC.  All questions were discussed and answered.    Pertinent previous records reviewed include none  Follow Up: 4 weeks for reevaluation.  If no improvement or worsening of symptoms, could consider prednisone Dosepak versus CSI versus advanced imaging   Subjective:   I, [name], am serving as a Neurosurgeon for Doctor  Richardean Sale  Chief Complaint: right hip pain   HPI:   10/01/2023 Patient is a 48 year old female with right hip pain. Patient states she has had pain for awhile the pain is now unmanageable. No MOI. Aleve and bayer for the pain and that does not help. No radiating pain. No numbness and tingling. Decreased ROM due to weakness.  She is TTP and has constant pain    Relevant Historical Information: History of seizure disorder, history of breast cancer, morbid obesity, prediabetes  Additional pertinent review of systems negative.   Current Outpatient Medications:    brompheniramine-pseudoephedrine-DM 30-2-10 MG/5ML syrup, Take 5 mLs by mouth 4 (four) times daily as needed., Disp: 120 mL, Rfl: 0   cyclobenzaprine (FLEXERIL) 5 MG tablet, Take 1 tablet (5 mg total) by mouth 3 (three) times daily as needed for muscle spasms., Disp: 30 tablet, Rfl: 1   levocetirizine (XYZAL) 5 MG tablet, Take 1 tablet (5 mg total) by mouth every evening., Disp: 90 tablet, Rfl: 1   loperamide (IMODIUM) 2 MG capsule, Take 1 capsule (2 mg total) by mouth as needed for diarrhea or loose stools., Disp: 30 capsule, Rfl: 0   meloxicam (MOBIC) 15 MG tablet, Take 1 tablet (15 mg total) by mouth daily., Disp: 30 tablet, Rfl: 0   naproxen sodium (ALEVE) 220 MG tablet, Take 220 mg by mouth as needed (pain)., Disp: , Rfl:    ondansetron (ZOFRAN-ODT) 4 MG disintegrating tablet, Take 1 tablet (4 mg total) by mouth every 8 (eight) hours as needed for nausea or vomiting., Disp: 20 tablet, Rfl: 0   Objective:     Vitals:   10/01/23 1400  Pulse: 85  SpO2: 95%  Weight: 286 lb (129.7 kg)  Height: 5\' 3"  (1.6 m)      Body mass index is 50.66 kg/m.    Physical Exam:    Gen: Appears well, nad, nontoxic and pleasant Psych: Alert and oriented, appropriate mood and affect Neuro: sensation intact, strength is 5/5 in upper and lower extremities, muscle tone wnl Skin: no susupicious lesions or rashes  Back - Normal skin, Spine  with normal alignment and no deformity.     tenderness to lumbar vertebral process palpation.   Bilateral lumbar paraspinous muscles are   tender and without spasm NTTP gluteal musculature Straight leg raise negative Trendelenberg negative Piriformis Test negative Gait normal  Positive FADIR for lateral hip pain on right Positive Faber for lateral hip pain on right TTP right greater trochanter  Electronically signed by:  [name] D.[facility] 2:20 PM 10/01/23

## 2023-10-01 ENCOUNTER — Ambulatory Visit (INDEPENDENT_AMBULATORY_CARE_PROVIDER_SITE_OTHER)

## 2023-10-01 ENCOUNTER — Ambulatory Visit: Admitting: Sports Medicine

## 2023-10-01 VITALS — HR 85 | Ht 63.0 in | Wt 286.0 lb

## 2023-10-01 DIAGNOSIS — M7061 Trochanteric bursitis, right hip: Secondary | ICD-10-CM | POA: Diagnosis not present

## 2023-10-01 DIAGNOSIS — M545 Low back pain, unspecified: Secondary | ICD-10-CM

## 2023-10-01 DIAGNOSIS — G8929 Other chronic pain: Secondary | ICD-10-CM

## 2023-10-01 DIAGNOSIS — M25551 Pain in right hip: Secondary | ICD-10-CM

## 2023-10-01 DIAGNOSIS — M47816 Spondylosis without myelopathy or radiculopathy, lumbar region: Secondary | ICD-10-CM | POA: Diagnosis not present

## 2023-10-01 DIAGNOSIS — M1611 Unilateral primary osteoarthritis, right hip: Secondary | ICD-10-CM | POA: Diagnosis not present

## 2023-10-01 MED ORDER — MELOXICAM 15 MG PO TABS
15.0000 mg | ORAL_TABLET | Freq: Every day | ORAL | 0 refills | Status: AC
Start: 2023-10-01 — End: ?

## 2023-10-01 NOTE — Patient Instructions (Signed)
-   Start meloxicam 15 mg daily x2 weeks.  If still having pain after 2 weeks, complete 3rd-week of NSAID. May use remaining NSAID as needed once daily for pain control.  Do not to use additional over-the-counter NSAIDs (ibuprofen, naproxen, Advil, Aleve, etc.) while taking prescription NSAIDs.  May use Tylenol [phone] mg 2 to 3 times a day for breakthrough pain. Low back HEP  Pt referral  4 week follow up

## 2023-10-18 ENCOUNTER — Ambulatory Visit: Attending: Family Medicine | Admitting: Audiologist

## 2023-10-23 ENCOUNTER — Ambulatory Visit: Admitting: Physical Therapy

## 2023-10-25 ENCOUNTER — Other Ambulatory Visit: Payer: Self-pay | Admitting: Sports Medicine

## 2023-10-26 NOTE — Progress Notes (Deleted)
    [name] D.[name] [name] [place] [address] Phone: [phone]   Assessment and Plan:     There are no diagnoses linked to this encounter.  ***   Pertinent previous records reviewed include ***    Follow Up: ***     Subjective:   I, [name], am serving as a Neurosurgeon for Doctor Ulysees Gander   Chief Complaint: right hip pain    HPI:    10/01/2023 Patient is a 48 year old female with right hip pain. Patient states she has had pain for awhile the pain is now unmanageable. No MOI. Aleve  and bayer for the pain and that does not help. No radiating pain. No numbness and tingling. Decreased ROM due to weakness.  She is TTP and has constant pain    10/29/2023 Patient states   Relevant Historical Information: History of seizure disorder, history of breast cancer, morbid obesity, prediabetes  Additional pertinent review of systems negative.   Current Outpatient Medications:    brompheniramine-pseudoephedrine-DM 30-2-10 MG/5ML syrup, Take 5 mLs by mouth 4 (four) times daily as needed., Disp: 120 mL, Rfl: 0   cyclobenzaprine  (FLEXERIL ) 5 MG tablet, Take 1 tablet (5 mg total) by mouth 3 (three) times daily as needed for muscle spasms., Disp: 30 tablet, Rfl: 1   levocetirizine (XYZAL ) 5 MG tablet, Take 1 tablet (5 mg total) by mouth every evening., Disp: 90 tablet, Rfl: 1   loperamide  (IMODIUM ) 2 MG capsule, Take 1 capsule (2 mg total) by mouth as needed for diarrhea or loose stools., Disp: 30 capsule, Rfl: 0   meloxicam  (MOBIC ) 15 MG tablet, Take 1 tablet (15 mg total) by mouth daily., Disp: 30 tablet, Rfl: 0   naproxen  sodium (ALEVE ) 220 MG tablet, Take 220 mg by mouth as needed (pain)., Disp: , Rfl:    ondansetron  (ZOFRAN -ODT) 4 MG disintegrating tablet, Take 1 tablet (4 mg total) by mouth every 8 (eight) hours as needed for nausea or vomiting., Disp: 20 tablet, Rfl: 0   Objective:     There were no vitals filed for this  visit.    There is no height or weight on file to calculate BMI.    Physical Exam:    ***   Electronically signed by:  Marshall Skeeter D.[name] 7:34 AM 10/26/23

## 2023-10-29 ENCOUNTER — Ambulatory Visit: Admitting: Sports Medicine

## 2023-11-01 ENCOUNTER — Telehealth: Admitting: Physician Assistant

## 2023-11-01 DIAGNOSIS — W57XXXA Bitten or stung by nonvenomous insect and other nonvenomous arthropods, initial encounter: Secondary | ICD-10-CM

## 2023-11-01 DIAGNOSIS — J069 Acute upper respiratory infection, unspecified: Secondary | ICD-10-CM | POA: Diagnosis not present

## 2023-11-01 DIAGNOSIS — L0390 Cellulitis, unspecified: Secondary | ICD-10-CM | POA: Diagnosis not present

## 2023-11-01 MED ORDER — CEPHALEXIN 500 MG PO CAPS: 500.0000 mg | ORAL_CAPSULE | Freq: Four times a day (QID) | ORAL | 0 refills | Status: DC

## 2023-11-01 MED ORDER — PSEUDOEPH-BROMPHEN-DM 30-2-10 MG/5ML PO SYRP: 5.0000 mL | ORAL_SOLUTION | Freq: Four times a day (QID) | ORAL | 0 refills | Status: AC | PRN

## 2023-11-01 NOTE — Patient Instructions (Signed)
 Nereida Banning, thank you for joining Angelia Kelp, PA-C for today's virtual visit.  While this provider is not your primary care provider (PCP), if your PCP is located in our provider database this encounter information will be shared with them immediately following your visit.   A [facility] MyChart account gives you access to today's visit and all your visits, tests, and labs performed at [facility] " click here if you don't have a [facility] MyChart account or go to [email or web address]  Consent: (Patient) [name] [facility] provided verbal consent for this virtual visit at the beginning of the encounter.  Current Medications:  Current Outpatient Medications:    brompheniramine-pseudoephedrine-DM 30-2-10 MG/5ML syrup, Take 5 mLs by mouth 4 (four) times daily as needed., Disp: 120 mL, Rfl: 0   cephALEXin (KEFLEX) 500 MG capsule, Take 1 capsule (500 mg total) by mouth 4 (four) times daily., Disp: 20 capsule, Rfl: 0   cyclobenzaprine  (FLEXERIL ) 5 MG tablet, Take 1 tablet (5 mg total) by mouth 3 (three) times daily as needed for muscle spasms., Disp: 30 tablet, Rfl: 1   levocetirizine (XYZAL ) 5 MG tablet, Take 1 tablet (5 mg total) by mouth every evening., Disp: 90 tablet, Rfl: 1   loperamide  (IMODIUM ) 2 MG capsule, Take 1 capsule (2 mg total) by mouth as needed for diarrhea or loose stools., Disp: 30 capsule, Rfl: 0   meloxicam  (MOBIC ) 15 MG tablet, Take 1 tablet (15 mg total) by mouth daily., Disp: 30 tablet, Rfl: 0   naproxen  sodium (ALEVE ) 220 MG tablet, Take 220 mg by mouth as needed (pain)., Disp: , Rfl:    ondansetron  (ZOFRAN -ODT) 4 MG disintegrating tablet, Take 1 tablet (4 mg total) by mouth every 8 (eight) hours as needed for nausea or vomiting., Disp: 20 tablet, Rfl: 0   Medications ordered in this encounter:  Meds ordered this encounter  Medications   brompheniramine-pseudoephedrine-DM 30-2-10 MG/5ML syrup    Sig: Take 5 mLs by mouth 4 (four) times  daily as needed.    Dispense:  120 mL    Refill:  0    Supervising Provider:   Corine Dice [[id number]]   cephALEXin (KEFLEX) 500 MG capsule    Sig: Take 1 capsule (500 mg total) by mouth 4 (four) times daily.    Dispense:  20 capsule    Refill:  0    Supervising Provider:   Corine Dice [[id number]]     *If you need refills on other medications prior to your next appointment, please contact your pharmacy*  Follow-Up: Call back or seek an in-person evaluation if the symptoms worsen or if the condition fails to improve as anticipated.   Virtual Care [phone]  Other Instructions Insect Bite, Adult An insect bite can make your skin red, itchy, and swollen. An insect bite is different from an insect sting, which happens when an insect injects poison (venom) into the skin. Some insects can spread disease to people through a bite. However, most insect bites do not lead to disease and are not serious. What are the causes? Insects may bite for a variety of reasons, including: Hunger. To defend themselves. Insects that bite include: Spiders. Mosquitoes and flies. Ticks and fleas. Ants. Kissing bugs. Chiggers. What are the signs or symptoms? In many cases, symptoms last for 2-4 days. However, itching can last up to 10 days. Symptoms include: Itching or pain in the bite area. Redness and swelling in the bite area. An open wound (skin  ulcer). In rare cases, a person may have a severe allergic reaction (anaphylactic reaction) to a bite. Symptoms of an anaphylactic reaction may include: Feeling warm in the face (flushed). This may include redness. Itchy, red, swollen areas of skin (hives). Swelling of the eyes, lips, face, mouth, tongue, or throat. Wheezing or difficulty breathing, speaking, or swallowing. Dizziness, light-headedness, or fainting. Abdominal symptoms like cramping, nausea, vomiting, or diarrhea. How is this diagnosed? This condition is usually  diagnosed based on symptoms and a physical exam. During the exam, your health care provider will look at the bite and ask you what kind of insect bit you. How is this treated? Most insect bites are not serious. Symptoms often go away on their own and treatment is not usually needed. When treatment is recommended, it may include: Applying ice to the affected area. Applying steroid or other anti-itch creams, like calamine lotion, to the bite area. Medicines called antihistamines to reduce itching. You may also need: A tetanus shot if you are not up to date. Antibiotic cream or an oral antibiotic if the bite becomes infected (this is uncommon). Follow these instructions at home: Bite area care  Do not scratch the bite area. It may help to cover the bite area with a bandage or close-fitting clothing. Keep the bite area clean and dry. Wash it every day with soap and water as told by your health care provider. Check the bite area every day for signs of infection. Check for: More redness, swelling, or pain. Fluid or blood. Warmth. Pus or a bad smell. Managing pain, itching, and swelling  You may apply cortisone cream, calamine lotion, or a paste made of baking soda and water to the bite area as told by your health care provider. If directed, put ice on the bite area. To do this: Put ice in a plastic bag. Place a towel between your skin and the bag. Leave the ice on for 20 minutes, 2-3 times a day. If your skin turns bright red, remove the ice right away to prevent skin damage. The risk of skin damage is higher if you cannot feel pain, heat, or cold. General instructions Apply or take over-the-counter and prescription medicine only as told by your health care provider. If you were prescribed antibiotics, take or apply them as told by your health care provider. Do not stop using the antibiotic even if you start to feel better. How is this prevented? To help reduce your risk of insect  bites: When you are outdoors, wear clothing that covers your arms and legs. This is especially important in the early morning and evening. Use insect repellent. The best insect repellents contain DEET, picaridin, oil of lemon eucalyptus (OLE), or IR3535. Consider spraying your clothing with a pesticide called permethrin. Permethrin helps prevent insect bites. It works for several weeks and for up to 5-6 clothing washes. Do not apply permethrin directly to the skin. If your home windows do not have screens, consider installing them. If you will be sleeping in an area where there are mosquitoes, consider covering your sleeping area with a mosquito net. Contact a health care provider if: Your bite area has signs of infection, such as: More redness, swelling, or pain. Fluid or blood. Warmth. Pus or a bad smell. You have a fever. Get help right away if: You have a rash. You have muscle or joint pain. You feel unusually tired or weak. You have neck pain or a headache. You develop symptoms of an anaphylactic reaction.  These may include: Swelling of the eyes, lips, face, mouth, tongue, or throat. Flushed skin or hives. Wheezing. Difficulty breathing, speaking, or swallowing. Dizziness, light-headedness, or fainting. Abdominal pain, cramping, vomiting, or diarrhea. These symptoms may be an emergency. Get help right away. Call 911. Do not wait to see if the symptoms will go away. Do not drive yourself to the hospital. Summary An insect bite can make your skin red, itchy, and swollen. Treatment is usually not needed. Symptoms often go away on their own. When treatment is recommended, it may involve taking medicine, applying medicine to the area, or applying ice. Apply or take over-the-counter and prescription medicines only as told by your health care provider. Use insect repellent to help prevent insect bites. Contact a health care provider if your bite area has signs of infection. This  information is not intended to replace advice given to you by your health care provider. Make sure you discuss any questions you have with your health care provider. Document Revised: 09/21/2021 Document Reviewed: 09/06/2021 Elsevier Patient Education  2024 Elsevier Inc.   If you have been instructed to have an in-person evaluation today at a local Urgent Care facility, please use the link below. It will take you to a list of all of our available [facility], including address, phone number and hours of operation. Please do not delay care.  [facility]  If you or a family member do not have a primary care provider, use the link below to schedule a visit and establish care. When you choose a [facility] primary care physician or advanced practice provider, you gain a long-term partner in health. Find a Primary Care Provider  Learn more about [facility] in-office and virtual care options: [facility] - Get Care Now

## 2023-11-01 NOTE — Progress Notes (Signed)
 Virtual Visit Consent   [name], you are scheduled for a virtual visit with a [facility] provider today. Just as with appointments in the office, your consent must be obtained to participate. Your consent will be active for this visit and any virtual visit you may have with one of our providers in the next 365 days. If you have a MyChart account, a copy of this consent can be sent to you electronically.  As this is a virtual visit, video technology does not allow for your provider to perform a traditional examination. This may limit your provider's ability to fully assess your condition. If your provider identifies any concerns that need to be evaluated in person or the need to arrange testing (such as labs, EKG, etc.), we will make arrangements to do so. Although advances in technology are sophisticated, we cannot ensure that it will always work on either your end or our end. If the connection with a video visit is poor, the visit may have to be switched to a telephone visit. With either a video or telephone visit, we are not always able to ensure that we have a secure connection.  By engaging in this virtual visit, you consent to the provision of healthcare and authorize for your insurance to be billed (if applicable) for the services provided during this visit. Depending on your insurance coverage, you may receive a charge related to this service.  I need to obtain your verbal consent now. Are you willing to proceed with your visit today? [name] has provided verbal consent on 11/01/2023 for a virtual visit (video or telephone). Angelia Kelp, PA-C  Date: 11/01/2023 3:20 PM   Virtual Visit via Video Note   IAngelia Kelp, connected with  [name]  ([id number], Dec 25, 1975) on 11/01/23 at  3:15 PM EDT by a video-enabled telemedicine application and verified that I am speaking with the correct person using two identifiers.  Location: Patient: Virtual Visit  Location Patient: Mobile Provider: Virtual Visit Location Provider: Home Office   I discussed the limitations of evaluation and management by telemedicine and the availability of in person appointments. The patient expressed understanding and agreed to proceed.    History of Present Illness: [name] is a 48 y.o. who identifies as a female who was assigned female at birth, and is being seen today for cough and "bug bite".  HPI: URI  This is a new problem. The current episode started today. The problem has been gradually worsening. There has been no fever. Associated symptoms include congestion, coughing, headaches and a sore throat. Pertinent negatives include no diarrhea, ear pain, nausea, plugged ear sensation, rhinorrhea, sinus pain, vomiting or wheezing. Associated symptoms comments: malaise. She has tried NSAIDs (aleve ) for the symptoms. The treatment provided no relief.  Exposed to Rhinovirus and pneumonia    Also has an area on the left lateral lower leg that she feels may have been a bug bite. Noticed it 2 days ago. Area has increased in size. Now also with pain and redness. Reports it is TTP, warm, and hard underlying the skin.   Problems:  Patient Active Problem List   Diagnosis Date Noted   Passive smoke exposure 12/13/2022   Nightmares 12/13/2022   Chronic midline low back pain without sciatica 04/05/2022   Bilateral leg pain 04/05/2022   Acute pharyngitis 03/07/2022   Right-sided face pain 03/07/2022   Abnormal radiologic finding of lung field 02/22/2022   Cough present for  greater than 3 weeks 02/22/2022   Right sided abdominal pain 02/20/2022   Dizziness 11/16/2021   Urinary incontinence 11/16/2021   Eye twitch 11/16/2021   History of seizures 11/16/2021   Mild intermittent asthma without complication 11/10/2021   Morbid obesity (HCC) 11/10/2021   Mild persistent asthma with acute exacerbation 11/10/2021   Productive cough 11/10/2021   Vitamin D  deficiency  10/31/2021   Prediabetes 10/31/2021   History of breast cancer 10/23/2019   Anxiety 07/05/2015   Breast cancer of upper-outer quadrant of left female breast (HCC) 07/16/2014   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 12/20/2009   SEIZURE DISORDER 07/07/2009   Anxiety and depression 08/24/2008    Allergies: No Known Allergies Medications:  Current Outpatient Medications:    brompheniramine-pseudoephedrine-DM 30-2-10 MG/5ML syrup, Take 5 mLs by mouth 4 (four) times daily as needed., Disp: 120 mL, Rfl: 0   cyclobenzaprine  (FLEXERIL ) 5 MG tablet, Take 1 tablet (5 mg total) by mouth 3 (three) times daily as needed for muscle spasms., Disp: 30 tablet, Rfl: 1   levocetirizine (XYZAL ) 5 MG tablet, Take 1 tablet (5 mg total) by mouth every evening., Disp: 90 tablet, Rfl: 1   loperamide  (IMODIUM ) 2 MG capsule, Take 1 capsule (2 mg total) by mouth as needed for diarrhea or loose stools., Disp: 30 capsule, Rfl: 0   meloxicam  (MOBIC ) 15 MG tablet, Take 1 tablet (15 mg total) by mouth daily., Disp: 30 tablet, Rfl: 0   naproxen  sodium (ALEVE ) 220 MG tablet, Take 220 mg by mouth as needed (pain)., Disp: , Rfl:    ondansetron  (ZOFRAN -ODT) 4 MG disintegrating tablet, Take 1 tablet (4 mg total) by mouth every 8 (eight) hours as needed for nausea or vomiting., Disp: 20 tablet, Rfl: 0  Observations/Objective: Patient is well-developed, well-nourished in no acute distress.  Resting comfortably Head is normocephalic, atraumatic.  No labored breathing.  Speech is clear and coherent with logical content.  Patient is alert and oriented at baseline.  Left lateral lower leg with an annular area of redness, swelling, TTP, and warmth  Assessment and Plan: There are no diagnoses linked to this encounter. - Keflex added for cellulitis of lower extremity - Bromfed DM for cough and congestion - Ice to lower extremity and elevate as able - Seek in person evaluation if continue to worsen or fails to  improve  Follow Up Instructions: I discussed the assessment and treatment plan with the patient. The patient was provided an opportunity to ask questions and all were answered. The patient agreed with the plan and demonstrated an understanding of the instructions.  A copy of instructions were sent to the patient via MyChart unless otherwise noted below.    The patient was advised to call back or seek an in-person evaluation if the symptoms worsen or if the condition fails to improve as anticipated.    Angelia Kelp, PA-C

## 2023-12-04 ENCOUNTER — Telehealth: Payer: Self-pay | Admitting: Family Medicine

## 2023-12-04 NOTE — Telephone Encounter (Unsigned)
 Copied from CRM [id number]. Topic: Clinical - Medication Question >> Dec 03, 2023  4:15 PM Jim Motts C wrote: Reason for CRM: Patient is calling in asking if her provider can write a script for [employer] Mastectomy.  Their fax number is [phone]. Phone is [phone].  Patient best contact is [phone].

## 2023-12-06 NOTE — Telephone Encounter (Signed)
 Called pt for clarification, pt state she needs a prescription for the special prosthetic parts for her bra due to her mastectomy.  Pt states prescription can say:  [employer]    and would like it faxed to number provided below

## 2023-12-11 NOTE — Telephone Encounter (Signed)
 Per [employer]- called and scheduled VV for this Friday for documentation purposes and will complete then

## 2023-12-14 ENCOUNTER — Encounter: Payer: Self-pay | Admitting: Family Medicine

## 2023-12-14 ENCOUNTER — Ambulatory Visit: Payer: Self-pay | Admitting: Family Medicine

## 2023-12-14 ENCOUNTER — Telehealth: Admitting: Family Medicine

## 2023-12-14 DIAGNOSIS — Z853 Personal history of malignant neoplasm of breast: Secondary | ICD-10-CM | POA: Diagnosis not present

## 2023-12-14 DIAGNOSIS — Z9012 Acquired absence of left breast and nipple: Secondary | ICD-10-CM | POA: Insufficient documentation

## 2023-12-14 NOTE — Progress Notes (Unsigned)
 MyChart Video Visit    Virtual Visit via Video Note    Patient location: Home. Patient and provider in visit Provider location: Office 2 patient identifiers used  I discussed the limitations of evaluation and management by telemedicine and the availability of in person appointments. The patient expressed understanding and agreed to proceed.  Visit Date: 12/14/2023  Today's healthcare provider: Alyson Back, NP-C     Subjective:    Patient ID: [name], female    DOB: April 27, 1976, 48 y.o.   MRN: [id number]  Chief Complaint  Patient presents with   Acute Visit    Discuss Mastectomy product     HPI  She had left mastectomy in 2016.  She has been paying out-of-pocket for her supplies including insert bras which are expensive. States her insurance told her to get prescription and the supplies could be covered.   Needs order for Mastectomy products  [employer] for her bras and insert [id number]    Past Medical History:  Diagnosis Date   Asthma    no current med.   Breast cancer of upper-outer quadrant of left female breast (HCC) 07/16/2014   Heart murmur    as a child   History of seizures    during pregnancies; last seizure 3 yrs. ago   Personal history of radiation therapy 2016   Seizures (HCC)    no medications   Stress headaches     Past Surgical History:  Procedure Laterality Date   ABDOMINAL HYSTERECTOMY     BREAST BIOPSY Left 07/14/2014   x2   BREAST BIOPSY Left 08/21/2016   BREAST LUMPECTOMY Left 08/06/2014   BREAST SURGERY     lumpectomy and lymph nodes   CESAREAN SECTION  [id number]   CESAREAN SECTION WITH BILATERAL TUBAL LIGATION  03/03/2003   CYSTO  12/20/2009   HYSTEROSCOPY W/ ENDOMETRIAL ABLATION  11/11/2007   LYSIS OF ADHESION  12/20/2009   TOTAL ABDOMINAL HYSTERECTOMY W/ BILATERAL SALPINGOOPHORECTOMY  12/20/2009   TYMPANOSTOMY TUBE PLACEMENT      Family History  Problem Relation Age of Onset   Hypertension Mother     Breast cancer Other        MGM's sister   Breast cancer Maternal Grandmother        dx in her 52s    Social History   Socioeconomic History   Marital status: Single    Spouse name: Not on file   Number of children: 3   Years of education: Not on file   Highest education level: 10th grade  Occupational History   Not on file  Tobacco Use   Smoking status: Never   Smokeless tobacco: Never  Vaping Use   Vaping status: Never Used  Substance and Sexual Activity   Alcohol use: Not Currently    Comment: rarely   Drug use: No   Sexual activity: Yes    Birth control/protection: None  Other Topics Concern   Not on file  Social History Narrative   Not on file   Social Drivers of Health   Financial Resource Strain: Medium Risk (10/17/2022)   Overall Financial Resource Strain (CARDIA)    Difficulty of Paying Living Expenses: Somewhat hard  Food Insecurity: Food Insecurity Present (10/17/2022)   Hunger Vital Sign    Worried About Running Out of Food in the Last Year: Sometimes true    Ran Out of Food in the Last Year: Never true  Transportation Needs: No Transportation Needs (10/17/2022)   PRAPARE -  Administrator, Civil Service (Medical): No    Lack of Transportation (Non-Medical): No  Physical Activity: Unknown (10/17/2022)   Exercise Vital Sign    Days of Exercise per Week: Patient declined    Minutes of Exercise per Session: Not on file  Stress: Stress Concern Present (10/17/2022)   [employer] of Occupational Health - Occupational Stress Questionnaire    Feeling of Stress : Very much  Social Connections: Socially Isolated (10/17/2022)   Social Connection and Isolation Panel    Frequency of Communication with Friends and Family: Once a week    Frequency of Social Gatherings with Friends and Family: Once a week    Attends Religious Services: Never    Database administrator or Organizations: No    Attends Engineer, structural: Not on file    Marital  Status: Living with partner  Intimate Partner Violence: Unknown (09/26/2021)   Received from [facility]   HITS    Physically Hurt: Not on file    Insult or Talk Down To: Not on file    Threaten Physical Harm: Not on file    Scream or Curse: Not on file    Outpatient Medications Prior to Visit  Medication Sig Dispense Refill   brompheniramine-pseudoephedrine-DM 30-2-10 MG/5ML syrup Take 5 mLs by mouth 4 (four) times daily as needed. 120 mL 0   cephALEXin  (KEFLEX ) 500 MG capsule Take 1 capsule (500 mg total) by mouth 4 (four) times daily. 20 capsule 0   cyclobenzaprine  (FLEXERIL ) 5 MG tablet Take 1 tablet (5 mg total) by mouth 3 (three) times daily as needed for muscle spasms. 30 tablet 1   levocetirizine (XYZAL ) 5 MG tablet Take 1 tablet (5 mg total) by mouth every evening. 90 tablet 1   loperamide  (IMODIUM ) 2 MG capsule Take 1 capsule (2 mg total) by mouth as needed for diarrhea or loose stools. 30 capsule 0   meloxicam  (MOBIC ) 15 MG tablet Take 1 tablet (15 mg total) by mouth daily. 30 tablet 0   naproxen  sodium (ALEVE ) 220 MG tablet Take 220 mg by mouth as needed (pain).     ondansetron  (ZOFRAN -ODT) 4 MG disintegrating tablet Take 1 tablet (4 mg total) by mouth every 8 (eight) hours as needed for nausea or vomiting. 20 tablet 0   No facility-administered medications prior to visit.    No Known Allergies  ROS No fever, chills, nausea, vomiting or diarrhea.    Objective:    Physical Exam  There were no vitals taken for this visit. Wt Readings from Last 3 Encounters:  10/01/23 286 lb (129.7 kg)  09/26/23 286 lb 6.4 oz (129.9 kg)  05/22/23 250 lb (113.4 kg)   Alert and oriented in no acute distress.  Respirations unlabored.  Normal speech and mood.    Assessment & Plan:   Problem List Items Addressed This Visit     History of breast cancer   Relevant Orders   For home use only DME Other see comment   History of mastectomy, left - Primary   Relevant Orders   For home  use only DME Other see comment   Order will be placed for mastectomy products.   I am having [name] maintain her naproxen  sodium, ondansetron , loperamide , levocetirizine, cyclobenzaprine , meloxicam , brompheniramine-pseudoephedrine-DM, and cephALEXin .  No orders of the defined types were placed in this encounter.   I discussed the assessment and treatment plan with the patient. The patient was provided an opportunity to ask  questions and all were answered. The patient agreed with the plan and demonstrated an understanding of the instructions.   The patient was advised to call back or seek an in-person evaluation if the symptoms worsen or if the condition fails to improve as anticipated.    Alyson Back, NP-C [facility] [phone] (phone) [phone] (fax)  [facility]

## 2023-12-17 ENCOUNTER — Encounter: Payer: Self-pay | Admitting: Family Medicine

## 2023-12-17 NOTE — Therapy (Deleted)
 OUTPATIENT PHYSICAL THERAPY THORACOLUMBAR EVALUATION   Patient Name: [name] MRN: [id number] DOB:30-Jun-1975, 48 y.o., female Today's Date: 12/17/2023  END OF SESSION:   Past Medical History:  Diagnosis Date   Asthma    no current med.   Breast cancer of upper-outer quadrant of left female breast (HCC) 07/16/2014   Heart murmur    as a child   History of seizures    during pregnancies; last seizure 3 yrs. ago   Personal history of radiation therapy 2016   Seizures (HCC)    no medications   Stress headaches    Past Surgical History:  Procedure Laterality Date   ABDOMINAL HYSTERECTOMY     BREAST BIOPSY Left 07/14/2014   x2   BREAST BIOPSY Left 08/21/2016   BREAST LUMPECTOMY Left 08/06/2014   BREAST SURGERY     lumpectomy and lymph nodes   CESAREAN SECTION  8002,8000   CESAREAN SECTION WITH BILATERAL TUBAL LIGATION  03/03/2003   CYSTO  12/20/2009   HYSTEROSCOPY W/ ENDOMETRIAL ABLATION  11/11/2007   LYSIS OF ADHESION  12/20/2009   TOTAL ABDOMINAL HYSTERECTOMY W/ BILATERAL SALPINGOOPHORECTOMY  12/20/2009   TYMPANOSTOMY TUBE PLACEMENT     Patient Active Problem List   Diagnosis Date Noted   History of mastectomy, left 12/14/2023   Passive smoke exposure 12/13/2022   Nightmares 12/13/2022   Chronic midline low back pain without sciatica 04/05/2022   Bilateral leg pain 04/05/2022   Acute pharyngitis 03/07/2022   Right-sided face pain 03/07/2022   Abnormal radiologic finding of lung field 02/22/2022   Cough present for greater than 3 weeks 02/22/2022   Right sided abdominal pain 02/20/2022   Dizziness 11/16/2021   Urinary incontinence 11/16/2021   Eye twitch 11/16/2021   History of seizures 11/16/2021   Mild intermittent asthma without complication 11/10/2021   Morbid obesity (HCC) 11/10/2021   Mild persistent asthma with acute exacerbation 11/10/2021   Productive cough 11/10/2021   Vitamin D  deficiency 10/31/2021   Prediabetes 10/31/2021   History of breast  cancer 10/23/2019   Anxiety 07/05/2015   Breast cancer of upper-outer quadrant of left female breast (HCC) 07/16/2014   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  ONSET DATE: chronic  SUBJECTIVE:  SUBJECTIVE STATEMENT: Patient is a 48 year old female with right hip pain. Patient states she has had pain for awhile the pain is now unmanageable. No MOI. Aleve  and bayer for the pain and that does not help. No radiating pain. No numbness and tingling. Decreased ROM due to weakness.  She is TTP and has constant pain   PERTINENT HISTORY:  ***  PAIN:  Are you having pain? Yes: NPRS scale: *** Pain location: *** Pain description: *** Aggravating factors: *** Relieving factors: ***  PRECAUTIONS: None  RED FLAGS: None   WEIGHT BEARING RESTRICTIONS: No  FALLS:  Has patient fallen in last 6 months? No  OCCUPATION: ***  PLOF: Independent  PATIENT GOALS: To manage my hip pain  NEXT MD VISIT: ***  OBJECTIVE:  Note: Objective measures were completed at Evaluation unless otherwise noted.  DIAGNOSTIC FINDINGS:  FINDINGS: Five non-rib-bearing lumbar vertebra. Normal alignment. Vertebral body heights are normal. Minor anterior spurring at multiple levels. No significant disc space narrowing. There is mild lower lumbar facet  hypertrophy. No evidence of fracture or visible pars defects. The sacroiliac joints are congruent.   IMPRESSION: Mild spondylosis, similar to 2023 exam.     Electronically Signed   By: Andrea Gasman M.D.   On: 10/09/2023 21:48  FINDINGS: Slight right hip joint space narrowing with acetabular spurring. Femoral head is well seated. No fracture. No evidence of erosion, avascular necrosis or focal bone abnormality. The bony pelvis is intact. Pubic symphysis and sacroiliac joints are congruent.   IMPRESSION: Mild right hip osteoarthritis.     Electronically Signed   By: Andrea Gasman M.D.   On: 10/09/2023 21:46  PATIENT SURVEYS:  Modified Oswestry:  MODIFIED OSWESTRY DISABILITY SCALE  Date: *** Score  Pain intensity {ODI 1:32962}  2. Personal care (washing, dressing, etc.) {ODI 2:32963}  3. Lifting {ODI 3:32964}  4. Walking {ODI 4:32965}  5. Sitting {ODI 5:32966}  6. Standing {ODI 6:32967}  7. Sleeping {ODI 7:32968}  8. Social Life {ODI 8:32969}  9. Traveling {ODI 9:32970}  10. Employment/ Homemaking {ODI 10:32971}  Total ***/50   Interpretation of scores: Score Category Description  0-20% Minimal Disability The patient can cope with most living activities. Usually no treatment is indicated apart from advice on lifting, sitting and exercise  21-40% Moderate Disability The patient experiences more pain and difficulty with sitting, lifting and standing. Travel and social life are more difficult and they may be disabled from work. Personal care, sexual activity and sleeping are not grossly affected, and the patient can usually be managed by conservative means  41-60% Severe Disability Pain remains the main problem in this group, but activities of daily living are affected. These patients require a detailed investigation  61-80% Crippled Back pain impinges on all aspects of the patient's life. Positive intervention is required  81-100% Bed-bound  These patients are either  bed-bound or exaggerating their symptoms  Bluford FORBES Zoe DELENA Karon DELENA, et al. Surgery versus conservative management of stable thoracolumbar fracture: the PRESTO feasibility RCT. [place] ([place]): [employer]; 2021 Nov. [facility] Technology Assessment, No. 25.62.) Appendix 3, Oswestry Disability Index category descriptors. Available from: FindJewel[employer].cz  Minimally Clinically Important Difference (MCID) = 12.8%  MUSCLE LENGTH: Hamstrings: Right *** deg; Left *** deg Debby test: Right *** deg; Left *** deg  POSTURE: {posture:25561}  PALPATION: ***  LUMBAR ROM:   AROM eval  Flexion   Extension   Right lateral flexion   Left lateral flexion   Right rotation  Left rotation    (Blank rows = not tested)  LOWER EXTREMITY ROM:     {AROM/PROM:27142}  Right eval Left eval  Hip flexion    Hip extension    Hip abduction    Hip adduction    Hip internal rotation    Hip external rotation    Knee flexion    Knee extension    Ankle dorsiflexion    Ankle plantarflexion    Ankle inversion    Ankle eversion     (Blank rows = not tested)  LOWER EXTREMITY MMT:    MMT Right eval Left eval  Hip flexion    Hip extension    Hip abduction    Hip adduction    Hip internal rotation    Hip external rotation    Knee flexion    Knee extension    Ankle dorsiflexion    Ankle plantarflexion    Ankle inversion    Ankle eversion     (Blank rows = not tested)  LUMBAR SPECIAL TESTS:  Straight leg raise test: {pos/neg:25243}, Slump test: {pos/neg:25243}, FABER test: {pos/neg:25243}, Trendelenburg sign: {pos/neg:25243}, and Thomas test: {pos/neg:25243}  FUNCTIONAL TESTS:  {Functional tests:24029}  GAIT: Distance walked: 96ftx2 Assistive device utilized: None Level of assistance: Complete Independence Comments: ***  TREATMENT:                                                                                                                              [facility] Adult PT Treatment:                                                DATE: *** Eval and HEP Self Care: Additional minutes spent for educating on updated Therapeutic Home Exercise Program as well as comparing current status to condition at start of symptoms. This included exercises focusing on stretching, strengthening, with focus on eccentric aspects. Long term goals include an improvement in range of motion, strength, endurance as well as avoiding reinjury. Patient's frequency would include in 1-2 times a day, 3-5 times a week for a duration of 6-12 weeks. Proper technique shown and discussed handout in great detail. All questions were discussed and addressed.      PATIENT EDUCATION:  Education details: Discussed eval findings, rehab rationale and POC and patient is in agreement  Person educated: Patient Education method: Explanation and Handouts Education comprehension: verbalized understanding and needs further education  HOME EXERCISE PROGRAM: ***  ASSESSMENT:  CLINICAL IMPRESSION: Patient is a *** y.o. *** who was seen today for physical therapy evaluation and treatment for ***.   OBJECTIVE IMPAIRMENTS: {opptimpairments:25111}.   ACTIVITY LIMITATIONS: {activitylimitations:27494}  PARTICIPATION LIMITATIONS: {participationrestrictions:25113}  PERSONAL FACTORS: {Personal factors:25162} are also affecting patient's functional outcome.   REHAB POTENTIAL: Fair based on chronicity  CLINICAL DECISION MAKING: Stable/uncomplicated  EVALUATION COMPLEXITY: Low  GOALS: Goals reviewed with patient? No  SHORT TERM GOALS: Target date: ***  *** Baseline: Goal status: INITIAL  2.  *** Baseline:  Goal status: INITIAL  3.  *** Baseline:  Goal status: INITIAL  4.  *** Baseline:  Goal status: INITIAL  5.  *** Baseline:  Goal status: INITIAL  6.  *** Baseline:  Goal status: INITIAL  LONG TERM GOALS: Target date: ***  *** Baseline:  Goal status:  INITIAL  2.  *** Baseline:  Goal status: INITIAL  3.  *** Baseline:  Goal status: INITIAL  4.  *** Baseline:  Goal status: INITIAL  5.  *** Baseline:  Goal status: INITIAL  6.  *** Baseline:  Goal status: INITIAL  PLAN:  PT FREQUENCY: 1-2x/week  PT DURATION: 6 weeks  PLANNED INTERVENTIONS: 97110-Therapeutic exercises, 97530- Therapeutic activity, [id number]- Neuromuscular re-education, 97535- Self Care, 02859- Manual therapy, [id number]- Gait training, Patient/Family education, Balance training, and Stair training.  PLAN FOR NEXT SESSION: HEP review and update, manual techniques as appropriate, aerobic tasks, ROM and flexibility activities, strengthening and PREs, TPDN, gait and balance training as needed     Reyes CHRISTELLA Kohut, PT 12/17/2023, 11:20 AM

## 2023-12-18 ENCOUNTER — Ambulatory Visit

## 2024-01-15 ENCOUNTER — Encounter: Payer: Self-pay | Admitting: Hematology and Oncology

## 2024-01-15 DIAGNOSIS — Z1231 Encounter for screening mammogram for malignant neoplasm of breast: Secondary | ICD-10-CM

## 2024-03-05 DIAGNOSIS — R051 Acute cough: Secondary | ICD-10-CM | POA: Diagnosis not present

## 2024-03-05 DIAGNOSIS — R062 Wheezing: Secondary | ICD-10-CM | POA: Diagnosis not present

## 2024-03-05 DIAGNOSIS — U071 COVID-19: Secondary | ICD-10-CM | POA: Diagnosis not present

## 2024-03-05 DIAGNOSIS — R519 Headache, unspecified: Secondary | ICD-10-CM | POA: Diagnosis not present

## 2024-03-05 DIAGNOSIS — J208 Acute bronchitis due to other specified organisms: Secondary | ICD-10-CM | POA: Diagnosis not present

## 2024-03-24 ENCOUNTER — Ambulatory Visit: Payer: Self-pay

## 2024-03-24 NOTE — Telephone Encounter (Signed)
 FYI Only or Action Required?: FYI only for provider.  Patient was last seen in primary care on 12/14/2023 by Lendia Boby CROME, NP-C.  Called Nurse Triage reporting Rash and Pruritis.  Symptoms began a week ago.  Interventions attempted: Rest, hydration, or home remedies.  Symptoms are: gradually worsening.  Triage Disposition: See PCP When Office is Open (Within 3 Days)  Patient/caregiver understands and will follow disposition?: Yes        Copied from CRM #[id number]. Topic: Clinical - Red Word Triage >> Mar 24, 2024  2:58 PM Jasmin G wrote: Red Word that prompted transfer to Nurse Triage: Rash that doesn't away, pt has been experiencing itching,hurting and burning for a week and half, rash located under and in between breasts. Reason for Disposition  [1] Severe localized itching AND [2] after 2 days of steroid cream  Answer Assessment - Initial Assessment Questions 1. APPEARANCE of RASH: What does the rash look like? (e.g., blisters, dry flaky skin, red spots, redness, sores)     Little red bumps 2. LOCATION: Where is the rash located?      Breasts - underneath and in between 3. NUMBER: How many spots are there?      Multiple spots 4. SIZE: How big are the spots? (e.g., inches, cm; or compare to size of pinhead, tip of pen, eraser, pea)      Varies in sizes 5. ONSET: When did the rash start?      1.5 weeks 6. ITCHING: Does the rash itch? If Yes, ask: How bad is the itch?  (Scale 0-10; or none, mild, moderate, severe)     yes 7. PAIN: Does the rash hurt? If Yes, ask: How bad is the pain?  (Scale 0-10; or none, mild, moderate, severe)     Yes - endorses burning 8. OTHER SYMPTOMS: Do you have any other symptoms? (e.g., fever)     denies 9. PREGNANCY: Is there any chance you are pregnant? When was your last menstrual period?     N/a  Protocols used: Rash or Redness - Localized-A-AH

## 2024-03-28 ENCOUNTER — Ambulatory Visit: Admitting: Family Medicine

## 2024-03-28 ENCOUNTER — Encounter: Payer: Self-pay | Admitting: Family Medicine

## 2024-03-28 VITALS — BP 112/82 | HR 75 | Temp 97.8°F | Ht 63.0 in | Wt 284.0 lb

## 2024-03-28 DIAGNOSIS — L282 Other prurigo: Secondary | ICD-10-CM | POA: Diagnosis not present

## 2024-03-28 DIAGNOSIS — Z853 Personal history of malignant neoplasm of breast: Secondary | ICD-10-CM | POA: Diagnosis not present

## 2024-03-28 DIAGNOSIS — N644 Mastodynia: Secondary | ICD-10-CM | POA: Diagnosis not present

## 2024-03-28 MED ORDER — CLOTRIMAZOLE-BETAMETHASONE 1-0.05 % EX CREA: 1.0000 | TOPICAL_CREAM | Freq: Every day | CUTANEOUS | 0 refills | Status: AC

## 2024-03-28 NOTE — Progress Notes (Signed)
 Subjective:     Patient ID: [name], female    DOB: [birth year], 48 y.o.   MRN: [id number]  Chief Complaint  Patient presents with   Rash    Rash located on her breasts and in between, first noticed 2 weeks ago. Has since gotten better but still wanted looked at    HPI  Discussed the use of AI scribe software for clinical note transcription with the patient, who gave verbal consent to proceed.  History of Present Illness [name] is a 48 year old female with a history of breast cancer who presents with a rash between and under her breasts.  Intertriginous rash - Rash developed two weeks ago between and under the breasts - Initial presentation as small pimples, some progressing to blisters - Hydrocortisone  cream and Monistat have significantly improved the rash - Persistent pruritus and residual rash remain - History of a similar rash during prior radiation treatment for breast cancer  Localized breast pain - Sharp, localized pain at the two o'clock position on the breast - Pain onset one week ago     Health Maintenance Due  Topic Date Due   Pneumococcal Vaccine (1 of 2 - PCV) Never done   Hepatitis B Vaccines 19-59 Average Risk (1 of 3 - 19+ 3-dose series) Never done   Colonoscopy  Never done   Mammogram  10/09/2023    Past Medical History:  Diagnosis Date   Asthma    no current med.   Breast cancer of upper-outer quadrant of left female breast (HCC) 07/16/2014   Heart murmur    as a child   History of seizures    during pregnancies; last seizure 3 yrs. ago   Personal history of radiation therapy 2016   Seizures (HCC)    no medications   Stress headaches     Past Surgical History:  Procedure Laterality Date   ABDOMINAL HYSTERECTOMY     BREAST BIOPSY Left 07/14/2014   x2   BREAST BIOPSY Left 08/21/2016   BREAST LUMPECTOMY Left 08/06/2014   BREAST SURGERY     lumpectomy and lymph nodes   CESAREAN SECTION  8002,8000   CESAREAN SECTION  WITH BILATERAL TUBAL LIGATION  03/03/2003   CYSTO  12/20/2009   HYSTEROSCOPY W/ ENDOMETRIAL ABLATION  11/11/2007   LYSIS OF ADHESION  12/20/2009   TOTAL ABDOMINAL HYSTERECTOMY W/ BILATERAL SALPINGOOPHORECTOMY  12/20/2009   TYMPANOSTOMY TUBE PLACEMENT      Family History  Problem Relation Age of Onset   Hypertension Mother    Breast cancer Other        MGM's sister   Breast cancer Maternal Grandmother        dx in her 61s    Social History   Socioeconomic History   Marital status: Single    Spouse name: Not on file   Number of children: 3   Years of education: Not on file   Highest education level: 10th grade  Occupational History   Not on file  Tobacco Use   Smoking status: Never   Smokeless tobacco: Never  Vaping Use   Vaping status: Never Used  Substance and Sexual Activity   Alcohol use: Not Currently    Comment: rarely   Drug use: No   Sexual activity: Yes    Birth control/protection: None  Other Topics Concern   Not on file  Social History Narrative   Not on file   Social Drivers of Corporate investment banker  Strain: Medium Risk (10/17/2022)   Overall Financial Resource Strain (CARDIA)    Difficulty of Paying Living Expenses: Somewhat hard  Food Insecurity: Food Insecurity Present (10/17/2022)   Hunger Vital Sign    Worried About Running Out of Food in the Last Year: Sometimes true    Ran Out of Food in the Last Year: Never true  Transportation Needs: No Transportation Needs (10/17/2022)   PRAPARE - Administrator, Civil Service (Medical): No    Lack of Transportation (Non-Medical): No  Physical Activity: Unknown (10/17/2022)   Exercise Vital Sign    Days of Exercise per Week: Patient declined    Minutes of Exercise per Session: Not on file  Stress: Stress Concern Present (10/17/2022)   [employer] of Occupational Health - Occupational Stress Questionnaire    Feeling of Stress : Very much  Social Connections: Socially Isolated (10/17/2022)    Social Connection and Isolation Panel    Frequency of Communication with Friends and Family: Once a week    Frequency of Social Gatherings with Friends and Family: Once a week    Attends Religious Services: Never    Database administrator or Organizations: No    Attends Engineer, structural: Not on file    Marital Status: Living with partner  Intimate Partner Violence: Unknown (09/26/2021)   Received from [facility]   HITS    Physically Hurt: Not on file    Insult or Talk Down To: Not on file    Threaten Physical Harm: Not on file    Scream or Curse: Not on file    Outpatient Medications Prior to Visit  Medication Sig Dispense Refill   brompheniramine-pseudoephedrine-DM 30-2-10 MG/5ML syrup Take 5 mLs by mouth 4 (four) times daily as needed. 120 mL 0   cephALEXin  (KEFLEX ) 500 MG capsule Take 1 capsule (500 mg total) by mouth 4 (four) times daily. 20 capsule 0   cyclobenzaprine  (FLEXERIL ) 5 MG tablet Take 1 tablet (5 mg total) by mouth 3 (three) times daily as needed for muscle spasms. 30 tablet 1   levocetirizine (XYZAL ) 5 MG tablet Take 1 tablet (5 mg total) by mouth every evening. 90 tablet 1   loperamide  (IMODIUM ) 2 MG capsule Take 1 capsule (2 mg total) by mouth as needed for diarrhea or loose stools. 30 capsule 0   meloxicam  (MOBIC ) 15 MG tablet Take 1 tablet (15 mg total) by mouth daily. 30 tablet 0   naproxen  sodium (ALEVE ) 220 MG tablet Take 220 mg by mouth as needed (pain).     ondansetron  (ZOFRAN -ODT) 4 MG disintegrating tablet Take 1 tablet (4 mg total) by mouth every 8 (eight) hours as needed for nausea or vomiting. 20 tablet 0   No facility-administered medications prior to visit.    No Known Allergies  Review of Systems  Constitutional:  Negative for chills and fever.  Respiratory:  Negative for shortness of breath.   Cardiovascular:  Negative for chest pain, palpitations and leg swelling.  Gastrointestinal:  Negative for abdominal pain, constipation,  diarrhea, nausea and vomiting.  Genitourinary:  Negative for dysuria, frequency and urgency.  Skin:  Positive for itching and rash.  Neurological:  Negative for dizziness and focal weakness.       Objective:    Physical Exam Constitutional:      General: She is not in acute distress.    Appearance: She is obese. She is not ill-appearing.  Eyes:     Extraocular Movements: Extraocular movements intact.  Conjunctiva/sclera: Conjunctivae normal.  Cardiovascular:     Rate and Rhythm: Normal rate.  Pulmonary:     Effort: Pulmonary effort is normal.  Chest:  Breasts:    Right: Tenderness present.     Comments: Right breast TTP at 2 o'clock without discrete mass palpated. Erythematous papules along medial breast folds and beneath bilateral breasts with healing. No drainage or induration  Musculoskeletal:     Cervical back: Normal range of motion and neck supple.  Skin:    General: Skin is warm and dry.  Neurological:     General: No focal deficit present.     Mental Status: She is alert and oriented to person, place, and time.     Motor: No weakness.     Coordination: Coordination normal.     Gait: Gait normal.  Psychiatric:        Mood and Affect: Mood normal.        Behavior: Behavior normal.        Thought Content: Thought content normal.      BP 112/82   Pulse 75   Temp 97.8 F (36.6 C) (Temporal)   Ht 5' 3 (1.6 m)   Wt 284 lb (128.8 kg)   SpO2 97%   BMI 50.31 kg/m  Wt Readings from Last 3 Encounters:  03/28/24 284 lb (128.8 kg)  10/01/23 286 lb (129.7 kg)  09/26/23 286 lb 6.4 oz (129.9 kg)       Assessment & Plan:   Problem List Items Addressed This Visit     History of breast cancer   Relevant Orders   MM Digital Diagnostic Bilat   US  BREAST COMPLETE UNI RIGHT INC AXILLA   Morbid obesity (HCC)   Other Visit Diagnoses       Pruritic rash    -  Primary     Breast tenderness       Relevant Orders   MM Digital Diagnostic Bilat   US  BREAST  COMPLETE UNI RIGHT INC AXILLA       Assessment and Plan Assessment & Plan Intertrigo (candidal) under and between breasts Intertrigo under and between the breasts, presenting with itching and blistering, likely candidal in nature. Previous use of hydrocortisone  and Benadryl  provided some relief.  - Prescribe Lotrisone cream for 2-4 weeks. If cost is prohibitive, recommend over-the-counter Lamisil as an alternative. - Advise to keep the area dry using a hair dryer after bathing. - Recommend not wearing a bra during sleep and using white cotton T-shirts or washcloths as a barrier to prevent skin-on-skin contact.   Right breast pain Sharp pain in the right breast at the two o'clock position, tender to touch. No obvious masses felt. Pain started a week ago. - Order diagnostic bilateral mammogram and ultrasound of the right breast.  History of left breast cancer and mastectomy Left breast cancer diagnosed in 2016, treated with mastectomy. No current issues reported with the left breast.  Obesity Actively working on weight loss through dietary changes and increased physical activity. Reports difficulty losing weight, possibly related to previous hysterectomy and age-related metabolic changes. - Encourage tracking of caloric intake using a free app like MyFitnessPal. - Advise on maintaining a calorie deficit for weight loss. - Discuss the importance of accurate portion sizes and tracking all food and beverage intake. - Will need follow up to further discuss weight loss      I am having [name] start on clotrimazole-betamethasone. I am also having her maintain her naproxen  sodium, ondansetron ,  loperamide , levocetirizine, cyclobenzaprine , meloxicam , brompheniramine-pseudoephedrine-DM, and cephALEXin .  Meds ordered this encounter  Medications   clotrimazole-betamethasone (LOTRISONE) cream    Sig: Apply 1 Application topically daily.    Dispense:  30 g    Refill:  0     Supervising Provider:   ROLLENE NORRIS A [[id number]]

## 2024-04-01 ENCOUNTER — Other Ambulatory Visit: Payer: Self-pay | Admitting: Medical Genetics

## 2024-04-08 ENCOUNTER — Ambulatory Visit
Admission: RE | Admit: 2024-04-08 | Discharge: 2024-04-08 | Disposition: A | Discharge status: Home / Self Care | Source: Ambulatory Visit | Attending: Family Medicine | Admitting: Family Medicine

## 2024-04-08 DIAGNOSIS — N644 Mastodynia: Secondary | ICD-10-CM

## 2024-04-08 DIAGNOSIS — Z853 Personal history of malignant neoplasm of breast: Secondary | ICD-10-CM

## 2024-05-13 ENCOUNTER — Telehealth: Admitting: Family Medicine

## 2024-05-13 DIAGNOSIS — R3989 Other symptoms and signs involving the genitourinary system: Secondary | ICD-10-CM | POA: Diagnosis not present

## 2024-05-13 MED ORDER — CEPHALEXIN 500 MG PO CAPS: 500.0000 mg | ORAL_CAPSULE | Freq: Two times a day (BID) | ORAL | 0 refills | Status: AC

## 2024-05-13 NOTE — Progress Notes (Signed)

## 2024-07-08 ENCOUNTER — Other Ambulatory Visit: Payer: Self-pay | Admitting: Medical Genetics

## 2024-07-08 DIAGNOSIS — Z006 Encounter for examination for normal comparison and control in clinical research program: Secondary | ICD-10-CM

## 2024-07-24 ENCOUNTER — Telehealth: Payer: Self-pay

## 2024-07-24 DIAGNOSIS — J4531 Mild persistent asthma with (acute) exacerbation: Secondary | ICD-10-CM

## 2024-07-24 NOTE — Progress Notes (Signed)
 Complex Care Management Note  Care Guide Note 07/24/2024 Name: ZARIA TAHA MRN: [id number] DOB: 12-02-1975  MICHAILA KENNEY is a 49 y.o. year old female who sees [place], [place], NP-C for primary care. I reached out to Corean CHRISTELLA Margarita by phone today to offer complex care management services.  Ms. Robello was given information about Complex Care Management services today including:   The Complex Care Management services include support from the care team which includes your Nurse Care Manager, Clinical Social Worker, or Pharmacist.  The Complex Care Management team is here to help remove barriers to the health concerns and goals most important to you. Complex Care Management services are voluntary, and the patient may decline or stop services at any time by request to their care team member.   Complex Care Management Consent Status: Patient agreed to services and verbal consent obtained.   Follow up plan:  Telephone appointment with complex care management team member scheduled for:  08/04/2024  Encounter Outcome:  Patient Scheduled   Jon Colt [facility]  [facility] Guide, Phone: [phone] Fax: [phone] Website: [email or web address]

## 2024-07-24 NOTE — Progress Notes (Signed)
" ° °  Telephone encounter was:  Successful.  Complex Care Management Note Care Guide Note  07/24/2024 Name: [name] MRN: [id number] DOB: 1975/11/14  [name] is a 49 y.o. year old female who is a primary care patient of Lendia, Vickie L, NP-C . The community resource team was consulted for assistance with Food Insecurity and Financial Difficulties related to financial strain  SDOH screenings and interventions completed:  Yes  Social Drivers of Health From This Encounter   Food Insecurity: Food Insecurity Present (07/24/2024)   Epic    Worried About Programme Researcher, Broadcasting/film/video in the Last Year: Often true    Ran Out of Food in the Last Year: Often true  Housing: Low Risk (07/24/2024)   Epic    Unable to Pay for Housing in the Last Year: No    Number of Times Moved in the Last Year: 0    Homeless in the Last Year: No  Financial Resource Strain: High Risk (07/24/2024)   Overall Financial Resource Strain (CARDIA)    Difficulty of Paying Living Expenses: Very hard  Utilities: Not At Risk (07/24/2024)   Epic    Threatened with loss of utilities: No    SDOH Interventions Today    Flowsheet Row Most Recent Value  SDOH Interventions   Food Insecurity Interventions Community Resources Provided, [place] Resources Referral  Financial Strain Interventions Community Resources Provided, [employer] Referral     Care guide performed the following interventions: Patient provided with information about care guide support team and interviewed to confirm resource needs.  Follow Up Plan:  No further follow up planned at this time. The patient has been provided with needed resources.  Encounter Outcome:  Patient Visit Completed   [facility]  [facility] Guide, Phone: [phone] Fax: [phone] Website: [email or web address]    "

## 2024-08-04 ENCOUNTER — Telehealth
